# Patient Record
Sex: Female | Born: 1940 | Race: White | Hispanic: No | Smoking: Former smoker
Health system: Southern US, Community
[De-identification: ages and names within clinical notes are randomized; demographics above are authoritative.]

## PROBLEM LIST (undated history)

## (undated) DIAGNOSIS — I2510 Atherosclerotic heart disease of native coronary artery without angina pectoris: Secondary | ICD-10-CM

## (undated) DIAGNOSIS — J45909 Unspecified asthma, uncomplicated: Secondary | ICD-10-CM

## (undated) DIAGNOSIS — J189 Pneumonia, unspecified organism: Secondary | ICD-10-CM

## (undated) DIAGNOSIS — E7800 Pure hypercholesterolemia, unspecified: Secondary | ICD-10-CM

## (undated) DIAGNOSIS — I219 Acute myocardial infarction, unspecified: Secondary | ICD-10-CM

## (undated) DIAGNOSIS — J449 Chronic obstructive pulmonary disease, unspecified: Secondary | ICD-10-CM

## (undated) DIAGNOSIS — N183 Chronic kidney disease, stage 3 unspecified: Secondary | ICD-10-CM

## (undated) DIAGNOSIS — I509 Heart failure, unspecified: Secondary | ICD-10-CM

## (undated) DIAGNOSIS — I10 Essential (primary) hypertension: Secondary | ICD-10-CM

## (undated) DIAGNOSIS — M1990 Unspecified osteoarthritis, unspecified site: Secondary | ICD-10-CM

## (undated) HISTORY — PX: JOINT REPLACEMENT: SHX530

## (undated) HISTORY — PX: CORONARY ANGIOPLASTY: SHX604

---

## 1994-08-06 DIAGNOSIS — J189 Pneumonia, unspecified organism: Secondary | ICD-10-CM

## 1994-08-06 HISTORY — DX: Pneumonia, unspecified organism: J18.9

## 1999-01-19 ENCOUNTER — Emergency Department (HOSPITAL_COMMUNITY): Admission: EM | Admit: 1999-01-19 | Discharge: 1999-01-19 | Payer: Self-pay | Admitting: Emergency Medicine

## 2001-07-08 ENCOUNTER — Encounter: Payer: Self-pay | Admitting: Orthopedic Surgery

## 2001-07-08 ENCOUNTER — Ambulatory Visit (HOSPITAL_COMMUNITY): Admission: RE | Admit: 2001-07-08 | Discharge: 2001-07-08 | Payer: Self-pay | Admitting: Orthopedic Surgery

## 2002-08-06 HISTORY — PX: KNEE ARTHROSCOPY: SHX127

## 2003-06-08 ENCOUNTER — Encounter: Admission: RE | Admit: 2003-06-08 | Discharge: 2003-06-08 | Payer: Self-pay | Admitting: Family Medicine

## 2004-10-18 ENCOUNTER — Encounter: Admission: RE | Admit: 2004-10-18 | Discharge: 2004-10-18 | Payer: Self-pay | Admitting: Family Medicine

## 2008-08-24 ENCOUNTER — Other Ambulatory Visit: Admission: RE | Admit: 2008-08-24 | Discharge: 2008-08-24 | Payer: Self-pay | Admitting: Family Medicine

## 2008-08-31 ENCOUNTER — Encounter: Admission: RE | Admit: 2008-08-31 | Discharge: 2008-08-31 | Payer: Self-pay | Admitting: Family Medicine

## 2009-10-17 ENCOUNTER — Encounter: Admission: RE | Admit: 2009-10-17 | Discharge: 2009-10-17 | Payer: Self-pay | Admitting: Family Medicine

## 2010-12-22 NOTE — Op Note (Signed)
[facility]  Patient:    Amber Rogers, Amber Rogers Visit Number: [id number] MRN: [id number]          Service Type: DSU Location: DAY Attending Physician:  Marlowe Kays Page Dictated by:   Illene Labrador. Aplington, M.D. Proc. Date: 07/08/01 Admit Date:  07/08/2001                             Operative Report  PREOPERATIVE DIAGNOSIS:  Osteoarthritis and torn medial meniscus, right knee.  POSTOPERATIVE DIAGNOSES: 1. Osteoarthritis. 2. Torn medial and lateral menisci, right knee.  OPERATION:  Right knee arthroscopy with: 1. Partial medial and lateral meniscectomy. 2. Debridement of patella, lateral femoral condyle, medial femoral condyle,    and medial tibial plateau, right knee.  SURGEON:  Illene Labrador. Aplington, M.D.  ASSISTANT:  Nurse.  ANESTHESIA:  General.  PATHOLOGY AND JUSTIFICATION FOR PROCEDURE:  Because of progressive right knee pain, I obtained an MRI of her right knee which demonstrated moderate osteoarthritis, particularly in the medial compartment of the knee joint with what was felt to be a complex tear of the medial meniscus.  Because of this, she is here today for the above mentioned surgery.  Additional arthroscopic findings are noted as discussed below.  DESCRIPTION OF PROCEDURE:  Satisfactory general anesthesia, pneumatic tourniquet, thigh stabilizer.  The right knee was prepped with DuraPrep, and draped in the sterile field.  Superomedial saline inflow.  First through an anteromedial portal, the lateral compartment of the knee joint was evaluated. There was a moderate synovitis present which I resected.  She had one area of grade 2 out of 4 chondromalacia of the mid to lateral portion of the medial femoral condyle which I pictured and shaved down with the 3.5 shaver.  She had significant tearing, particularly at the inner border of the lateral meniscus, particularly at the curve posteriorly and after picturing this, I debrided back with  baskets, and shaved the meniscus down with a 3.5 shaver.  Final pictures were taken.  Her ACL was intact.  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  Most of this I could shave through this portal, but I had to do a little additional shaving on the reverse portal technique.  After shaving as much of the patella as I could through the anterolateral portal, I reversed portals, and looked at the medial compartment of the knee joint through the anterolateral portal.  She had some synovitis which I resected.  She had a good bit of wear of the medial tibial plateau which I pictured as well as a full thickness defect along the junction of the mid anterior third of the medial meniscus, and this was associated with an area of fairly significant tearing just posterior to it and to the posterior curve. This was also pictured.  I then debrided back the meniscus to a stable rim, and then smoothed it down with a 3.5 shaver as well as the medial femoral condyle.  On probing the residual medial meniscus, it was nice and stable with no other defects noted.  A final picture was taken.  I then went up in the medial gutter and the suprapatellar area and completed the shaving of the patella with the 3.5 shaver.  When all of this had been completed, I irrigated the wound well with sterile saline  until clear.  The two anterior portals were then closed with 4-0 nylon and I injected through the inflow apparatus 20 cc of 0.5% Marcaine with adrenalin and 4 mg of morphine.  I then removed the inflow cannula and closed this portal with 4-0 nylon as well.  Betadine Adaptic dry sterile dressings were applied.  The tourniquet was released.  She tolerated the procedure well and was taken to the recovery room in satisfactory condition with no known complication. Dictated by:   Illene Labrador.  Aplington, M.D. Attending Physician:  Joaquin Courts DD:  07/08/01 TD:  07/08/01 Job: [id number]

## 2011-08-16 ENCOUNTER — Other Ambulatory Visit: Payer: Self-pay | Admitting: Family Medicine

## 2011-08-16 DIAGNOSIS — Z1231 Encounter for screening mammogram for malignant neoplasm of breast: Secondary | ICD-10-CM

## 2011-08-20 DIAGNOSIS — Z1211 Encounter for screening for malignant neoplasm of colon: Secondary | ICD-10-CM | POA: Diagnosis not present

## 2011-08-20 DIAGNOSIS — Z79899 Other long term (current) drug therapy: Secondary | ICD-10-CM | POA: Diagnosis not present

## 2011-08-20 DIAGNOSIS — M1990 Unspecified osteoarthritis, unspecified site: Secondary | ICD-10-CM | POA: Diagnosis not present

## 2011-08-20 DIAGNOSIS — E7800 Pure hypercholesterolemia, unspecified: Secondary | ICD-10-CM | POA: Diagnosis not present

## 2011-08-20 DIAGNOSIS — I10 Essential (primary) hypertension: Secondary | ICD-10-CM | POA: Diagnosis not present

## 2011-08-20 DIAGNOSIS — J449 Chronic obstructive pulmonary disease, unspecified: Secondary | ICD-10-CM | POA: Diagnosis not present

## 2011-08-21 ENCOUNTER — Ambulatory Visit: Payer: Self-pay

## 2011-08-22 ENCOUNTER — Ambulatory Visit
Admission: RE | Admit: 2011-08-22 | Discharge: 2011-08-22 | Disposition: A | Discharge status: Home / Self Care | Payer: BC Managed Care – PPO | Source: Ambulatory Visit | Attending: Family Medicine | Admitting: Family Medicine

## 2011-08-22 DIAGNOSIS — Z1231 Encounter for screening mammogram for malignant neoplasm of breast: Secondary | ICD-10-CM

## 2012-02-20 DIAGNOSIS — M1990 Unspecified osteoarthritis, unspecified site: Secondary | ICD-10-CM | POA: Diagnosis not present

## 2012-02-20 DIAGNOSIS — I10 Essential (primary) hypertension: Secondary | ICD-10-CM | POA: Diagnosis not present

## 2012-02-20 DIAGNOSIS — J449 Chronic obstructive pulmonary disease, unspecified: Secondary | ICD-10-CM | POA: Diagnosis not present

## 2012-02-20 DIAGNOSIS — F17200 Nicotine dependence, unspecified, uncomplicated: Secondary | ICD-10-CM | POA: Diagnosis not present

## 2012-02-20 DIAGNOSIS — M79609 Pain in unspecified limb: Secondary | ICD-10-CM | POA: Diagnosis not present

## 2012-10-30 DIAGNOSIS — M25559 Pain in unspecified hip: Secondary | ICD-10-CM | POA: Diagnosis not present

## 2012-11-04 DIAGNOSIS — M169 Osteoarthritis of hip, unspecified: Secondary | ICD-10-CM | POA: Diagnosis not present

## 2012-11-28 DIAGNOSIS — M169 Osteoarthritis of hip, unspecified: Secondary | ICD-10-CM | POA: Diagnosis not present

## 2013-01-05 DIAGNOSIS — M169 Osteoarthritis of hip, unspecified: Secondary | ICD-10-CM | POA: Diagnosis not present

## 2013-01-05 DIAGNOSIS — Z87891 Personal history of nicotine dependence: Secondary | ICD-10-CM | POA: Diagnosis not present

## 2013-01-16 DIAGNOSIS — M169 Osteoarthritis of hip, unspecified: Secondary | ICD-10-CM | POA: Diagnosis not present

## 2013-01-28 ENCOUNTER — Encounter (HOSPITAL_COMMUNITY): Payer: Self-pay | Admitting: Pharmacy Technician

## 2013-01-28 DIAGNOSIS — M169 Osteoarthritis of hip, unspecified: Secondary | ICD-10-CM | POA: Diagnosis not present

## 2013-01-29 ENCOUNTER — Other Ambulatory Visit: Payer: Self-pay | Admitting: Family Medicine

## 2013-01-29 DIAGNOSIS — M1990 Unspecified osteoarthritis, unspecified site: Secondary | ICD-10-CM | POA: Diagnosis not present

## 2013-01-29 DIAGNOSIS — R9431 Abnormal electrocardiogram [ECG] [EKG]: Secondary | ICD-10-CM | POA: Diagnosis not present

## 2013-01-29 DIAGNOSIS — Z1239 Encounter for other screening for malignant neoplasm of breast: Secondary | ICD-10-CM | POA: Diagnosis not present

## 2013-01-29 DIAGNOSIS — I10 Essential (primary) hypertension: Secondary | ICD-10-CM | POA: Diagnosis not present

## 2013-01-29 DIAGNOSIS — Z01810 Encounter for preprocedural cardiovascular examination: Secondary | ICD-10-CM | POA: Diagnosis not present

## 2013-01-29 DIAGNOSIS — Z1231 Encounter for screening mammogram for malignant neoplasm of breast: Secondary | ICD-10-CM

## 2013-01-29 DIAGNOSIS — F17200 Nicotine dependence, unspecified, uncomplicated: Secondary | ICD-10-CM | POA: Diagnosis not present

## 2013-01-29 DIAGNOSIS — J449 Chronic obstructive pulmonary disease, unspecified: Secondary | ICD-10-CM | POA: Diagnosis not present

## 2013-01-29 DIAGNOSIS — Z1211 Encounter for screening for malignant neoplasm of colon: Secondary | ICD-10-CM | POA: Diagnosis not present

## 2013-01-29 DIAGNOSIS — E7800 Pure hypercholesterolemia, unspecified: Secondary | ICD-10-CM | POA: Diagnosis not present

## 2013-02-02 ENCOUNTER — Encounter (HOSPITAL_COMMUNITY)
Admission: RE | Admit: 2013-02-02 | Discharge: 2013-02-02 | Disposition: A | Discharge status: Home / Self Care | Payer: BC Managed Care – PPO | Source: Ambulatory Visit | Attending: Orthopedic Surgery | Admitting: Orthopedic Surgery

## 2013-02-02 ENCOUNTER — Encounter (HOSPITAL_COMMUNITY): Payer: Self-pay

## 2013-02-02 DIAGNOSIS — M169 Osteoarthritis of hip, unspecified: Secondary | ICD-10-CM | POA: Insufficient documentation

## 2013-02-02 DIAGNOSIS — M1610 Unilateral primary osteoarthritis, unspecified hip: Secondary | ICD-10-CM | POA: Insufficient documentation

## 2013-02-02 DIAGNOSIS — Z01812 Encounter for preprocedural laboratory examination: Secondary | ICD-10-CM | POA: Insufficient documentation

## 2013-02-02 HISTORY — DX: Essential (primary) hypertension: I10

## 2013-02-02 HISTORY — DX: Pneumonia, unspecified organism: J18.9

## 2013-02-02 HISTORY — DX: Unspecified asthma, uncomplicated: J45.909

## 2013-02-02 LAB — CBC
MCH: 29.5 pg (ref 26.0–34.0)
Platelets: 407 10*3/uL — ABNORMAL HIGH (ref 150–400)
RBC: 4 MIL/uL (ref 3.87–5.11)
RDW: 13.4 % (ref 11.5–15.5)
WBC: 11 10*3/uL — ABNORMAL HIGH (ref 4.0–10.5)

## 2013-02-02 LAB — BASIC METABOLIC PANEL
Calcium: 9.7 mg/dL (ref 8.4–10.5)
Chloride: 104 mEq/L (ref 96–112)
GFR calc non Af Amer: 57 mL/min — ABNORMAL LOW (ref >90)
Potassium: 4.1 mEq/L (ref 3.5–5.1)

## 2013-02-02 LAB — URINALYSIS, ROUTINE W REFLEX MICROSCOPIC
Hgb urine dipstick: NEGATIVE
Ketones, ur: NEGATIVE mg/dL
Protein, ur: NEGATIVE mg/dL
Urobilinogen, UA: 0.2 mg/dL (ref 0.0–1.0)

## 2013-02-02 LAB — PROTIME-INR: Prothrombin Time: 12.3 seconds (ref 11.6–15.2)

## 2013-02-02 LAB — SURGICAL PCR SCREEN: Staphylococcus aureus: NEGATIVE

## 2013-02-02 NOTE — Progress Notes (Signed)
02/02/13 1054  OBSTRUCTIVE SLEEP APNEA  Have you ever been diagnosed with sleep apnea through a sleep study? No  Do you snore loudly (loud enough to be heard through closed doors)?  1  Do you often feel tired, fatigued, or sleepy during the daytime? 1  Has anyone observed you stop breathing during your sleep? 0  Do you have, or are you being treated for high blood pressure? 1  BMI more than 35 kg/m2? 0  Age over 71 years old? 1  Neck circumference greater than 40 cm/18 inches? 0  Gender: 0  Obstructive Sleep Apnea Score 4  Score 4 or greater  Results sent to PCP

## 2013-02-02 NOTE — Patient Instructions (Addendum)
20      Your procedure is scheduled on:  Tuesday 02/10/2013  Report to [facility] at  1035  AM.  Call this number if you have problems the morning of surgery: [phone]   Remember:             IF YOU USE CPAP,BRING MASK AND TUBING AM OF SURGERY!   Do not eat food AFTER MIDNIGHT! MAY HAVE CLEAR LIQUIDS FROM MIDNIGHT UP UNTIL 0735 AM THEN NOTHING UNTIL AFTER SURGERY!  Take these medicines the morning of surgery with A SIP OF WATER:  Use Advair Diskus inhaler if needed   Do not bring valuables to the hospital. [facility] IS NOT RESPONSIBLE  FOR ANY BELONGINGS OR VALUABLES.  Wynelle Fanny suitcase in the car. After surgery it may be brought to your room.  For patients admitted to the hospital, checkout time is 11:00 AM the day of              Discharge.    DO NOT WEAR JEWELRY , MAKE-UP, LOTIONS,POWDERS,PERFUMES!             WOMEN -DO NOT SHAVE LEGS OR UNDERARMS 12 HRS. BEFORE  SURGERY!               MEN MAY SHAVE AS USUAL!             CONTACTS,DENTURES OR BRIDGEWORK, FALSE EYELASHES MAY NOT BE WORN INTO SURGERY!                                           Patients discharged the day of surgery will not be allowed to drive home.If going home the same day of surgery, must have someone stay with you first 24 hrs.at home and arrange for someone to drive you home from the Hospital.                         YOUR DRIVER IS: Randa Evens   Special Instructions:             Please read over the following fact sheets that you were given:             1.  PREPARING FOR SURGERY SHEET              2.MRSA INFORMATION              3.INCENTIVE SPIROMETRY                                        Telford Nab.Jacquelynn Friend,RN,BSN     [phone]                FAILURE TO FOLLOW THESE INSTRUCTIONS MAY RESULT IN  CANCELLATION OF YOUR SURGERY!               Patient Signature:___________________________

## 2013-02-02 NOTE — Progress Notes (Signed)
Office note from Dr. Manus Gunning from 01/29/2013 and EKG from [facility] from 01/29/2013 and Hgb,Lipid pannel, and BMET from 01/27/2013 by Dr. Manus Gunning all on chart.

## 2013-02-03 NOTE — Progress Notes (Signed)
Pre-operative clearance note from Dr. Manus Gunning 01/23/2013 on chart.

## 2013-02-08 NOTE — H&P (Signed)
TOTAL HIP ADMISSION H&P  Patient is admitted for right total hip arthroplasty, anterior aproach.  Subjective:  Chief Complaint: Right hip OA / pain  HPI: Amber Rogers, 72 y.o. female, has a history of pain and functional disability in the right hip(s) due to arthritis and patient has failed non-surgical conservative treatments for greater than 12 weeks to include NSAID's and/or analgesics, use of assistive devices and activity modification.  Onset of symptoms was gradual starting 1 years ago with rapidlly worsening course since that time.The patient noted no past surgery on the right hip(s).  Patient currently rates pain in the right hip at 10 out of 10 with activity. Patient has worsening of pain with activity and weight bearing, trendelenberg gait, pain that interfers with activities of daily living, pain with passive range of motion and worst pain when trying to get up from sitting position. Patient has evidence of periarticular osteophytes and joint space narrowing by imaging studies. This condition presents safety issues increasing the risk of falls.   There is no current active signs of infection.  Risks, benefits and expectations were discussed with the patient. Patient understand the risks, benefits and expectations and wishes to proceed with surgery.   D/C Plans:   Home with HHPT - Daughter's house  Post-op Meds:    Rx given for ASA, Zanaflex, Iron, Colace and MiraLax  Tranexamic Acid:   To be given  Decadron:    To be given  FYI:    ASA post-op   Past Medical History  Diagnosis Date  . Hypertension   . Arthritis   . Asthma   . Pneumonia 1996    Past Surgical History  Procedure Laterality Date  . Right knee arthroscopy  2004    No prescriptions prior to admission   Not on File  History  Substance Use Topics  . Smoking status: Current Every Day Smoker -- 25 years    Types: Cigarettes  . Smokeless tobacco: Not on file     Comment: smokes occassionally-1-2 when nervous   . Alcohol Use: No    No family history on file.   Review of Systems  Constitutional: Negative.   HENT: Negative.   Eyes: Negative.   Respiratory: Negative.   Cardiovascular: Negative.   Gastrointestinal: Negative.   Genitourinary: Negative.   Musculoskeletal: Positive for myalgias, back pain and joint pain.  Skin: Negative.   Neurological: Positive for dizziness.  Endo/Heme/Allergies: Negative.   Psychiatric/Behavioral: Negative.     Objective:  Physical Exam  Constitutional: She is oriented to person, place, and time. She appears well-developed and well-nourished.  HENT:  Head: Normocephalic and atraumatic.  Mouth/Throat: Oropharynx is clear and moist.  Eyes: Pupils are equal, round, and reactive to light.  Neck: Neck supple. No JVD present. No tracheal deviation present. No thyromegaly present.  Cardiovascular: Normal rate, regular rhythm, normal heart sounds and intact distal pulses.   Respiratory: Effort normal and breath sounds normal. No stridor. No respiratory distress. She has no wheezes.  GI: Soft. There is no tenderness. There is no guarding.  Musculoskeletal:       Right hip: She exhibits decreased range of motion, decreased strength, tenderness and bony tenderness. She exhibits no swelling, no deformity and no laceration.  Lymphadenopathy:    She has no cervical adenopathy.  Neurological: She is alert and oriented to person, place, and time.  Skin: Skin is warm and dry.  Psychiatric: She has a normal mood and affect.    Imaging Review Plain  radiographs demonstrate severe degenerative joint disease of the right hip(s). The bone quality appears to be good for age and reported activity level.  Assessment/Plan:  End stage arthritis, right hip(s)  The patient history, physical examination, clinical judgement of the provider and imaging studies are consistent with end stage degenerative joint disease of the right hip(s) and total hip arthroplasty is deemed  medically necessary. The treatment options including medical management, injection therapy, arthroscopy and arthroplasty were discussed at length. The risks and benefits of total hip arthroplasty were presented and reviewed. The risks due to aseptic loosening, infection, stiffness, dislocation/subluxation,  thromboembolic complications and other imponderables were discussed.  The patient acknowledged the explanation, agreed to proceed with the plan and consent was signed. Patient is being admitted for inpatient treatment for surgery, pain control, PT, OT, prophylactic antibiotics, VTE prophylaxis, progressive ambulation and ADL's and discharge planning.The patient is planning to be discharged home with home health services.    Amber Rogers Amber Rogers   PAC  02/08/2013, 10:53 AM

## 2013-02-09 NOTE — Progress Notes (Signed)
Pt notified of time change to 12:45 pm - instructed to arrive at 9:45 am - clear liq until 6:45 am

## 2013-02-10 ENCOUNTER — Inpatient Hospital Stay (HOSPITAL_COMMUNITY): Payer: BC Managed Care – PPO

## 2013-02-10 ENCOUNTER — Encounter (HOSPITAL_COMMUNITY): Payer: Self-pay | Admitting: Anesthesiology

## 2013-02-10 ENCOUNTER — Inpatient Hospital Stay (HOSPITAL_COMMUNITY)
Admission: RE | Admit: 2013-02-10 | Discharge: 2013-02-12 | DRG: 818 | Disposition: A | Discharge status: Home Health | Payer: BC Managed Care – PPO | Source: Ambulatory Visit | Attending: Orthopedic Surgery | Admitting: Orthopedic Surgery

## 2013-02-10 ENCOUNTER — Inpatient Hospital Stay (HOSPITAL_COMMUNITY): Payer: BC Managed Care – PPO | Admitting: Anesthesiology

## 2013-02-10 ENCOUNTER — Encounter (HOSPITAL_COMMUNITY): Payer: Self-pay | Admitting: *Deleted

## 2013-02-10 ENCOUNTER — Encounter (HOSPITAL_COMMUNITY)
Admission: RE | Disposition: A | Discharge status: Home Health | Payer: Self-pay | Source: Ambulatory Visit | Attending: Orthopedic Surgery

## 2013-02-10 DIAGNOSIS — D500 Iron deficiency anemia secondary to blood loss (chronic): Secondary | ICD-10-CM

## 2013-02-10 DIAGNOSIS — M1610 Unilateral primary osteoarthritis, unspecified hip: Principal | ICD-10-CM | POA: Diagnosis present

## 2013-02-10 DIAGNOSIS — F17200 Nicotine dependence, unspecified, uncomplicated: Secondary | ICD-10-CM | POA: Diagnosis not present

## 2013-02-10 DIAGNOSIS — M25559 Pain in unspecified hip: Secondary | ICD-10-CM | POA: Diagnosis not present

## 2013-02-10 DIAGNOSIS — I10 Essential (primary) hypertension: Secondary | ICD-10-CM | POA: Diagnosis not present

## 2013-02-10 DIAGNOSIS — Z01812 Encounter for preprocedural laboratory examination: Secondary | ICD-10-CM

## 2013-02-10 DIAGNOSIS — Z96649 Presence of unspecified artificial hip joint: Secondary | ICD-10-CM

## 2013-02-10 DIAGNOSIS — D62 Acute posthemorrhagic anemia: Secondary | ICD-10-CM | POA: Diagnosis not present

## 2013-02-10 DIAGNOSIS — M169 Osteoarthritis of hip, unspecified: Secondary | ICD-10-CM | POA: Diagnosis not present

## 2013-02-10 HISTORY — PX: TOTAL HIP ARTHROPLASTY: SHX124

## 2013-02-10 LAB — TYPE AND SCREEN: Antibody Screen: NEGATIVE

## 2013-02-10 SURGERY — ARTHROPLASTY, HIP, TOTAL, ANTERIOR APPROACH
Anesthesia: Spinal | Site: Hip | Laterality: Right | Wound class: Clean

## 2013-02-10 MED ORDER — 0.9 % SODIUM CHLORIDE (POUR BTL) OPTIME
TOPICAL | Status: DC | PRN
  Administered 2013-02-10: 1000 mL

## 2013-02-10 MED ORDER — PREDNISONE 5 MG PO TABS
5.0000 mg | ORAL_TABLET | Freq: Every day | ORAL | Status: DC
  Administered 2013-02-10 – 2013-02-11 (×2): 5 mg via ORAL
  Filled 2013-02-10 (×3): qty 1

## 2013-02-10 MED ORDER — HYDROCORTISONE NA SUCCINATE PF 1000 MG IJ SOLR
INTRAMUSCULAR | Status: DC | PRN
  Administered 2013-02-10: 100 mg via INTRAVENOUS

## 2013-02-10 MED ORDER — PROMETHAZINE HCL 25 MG/ML IJ SOLN: 6.2500 mg | INTRAMUSCULAR | Status: DC | PRN

## 2013-02-10 MED ORDER — SODIUM CHLORIDE 0.9 % IV SOLN
1000.0000 mg | Freq: Once | INTRAVENOUS | Status: AC
  Administered 2013-02-10: 1000 mg via INTRAVENOUS
  Filled 2013-02-10: qty 10

## 2013-02-10 MED ORDER — MOMETASONE FURO-FORMOTEROL FUM 100-5 MCG/ACT IN AERO
2.0000 | INHALATION_SPRAY | Freq: Two times a day (BID) | RESPIRATORY_TRACT | Status: DC
  Administered 2013-02-10 – 2013-02-12 (×4): 2 via RESPIRATORY_TRACT
  Filled 2013-02-10: qty 8.8

## 2013-02-10 MED ORDER — DEXAMETHASONE SODIUM PHOSPHATE 10 MG/ML IJ SOLN: 10.0000 mg | Freq: Once | INTRAMUSCULAR | Status: DC

## 2013-02-10 MED ORDER — ONDANSETRON HCL 4 MG/2ML IJ SOLN
4.0000 mg | Freq: Four times a day (QID) | INTRAMUSCULAR | Status: DC | PRN
  Administered 2013-02-11: 4 mg via INTRAVENOUS
  Filled 2013-02-10: qty 2

## 2013-02-10 MED ORDER — DIPHENHYDRAMINE HCL 25 MG PO CAPS: 25.0000 mg | ORAL_CAPSULE | Freq: Four times a day (QID) | ORAL | Status: DC | PRN

## 2013-02-10 MED ORDER — DOCUSATE SODIUM 100 MG PO CAPS
100.0000 mg | ORAL_CAPSULE | Freq: Two times a day (BID) | ORAL | Status: DC
  Administered 2013-02-10 – 2013-02-12 (×4): 100 mg via ORAL

## 2013-02-10 MED ORDER — CEFAZOLIN SODIUM-DEXTROSE 2-3 GM-% IV SOLR
2.0000 g | INTRAVENOUS | Status: AC
  Administered 2013-02-10: 2 g via INTRAVENOUS

## 2013-02-10 MED ORDER — AMLODIPINE BESYLATE 10 MG PO TABS
10.0000 mg | ORAL_TABLET | Freq: Every day | ORAL | Status: DC
  Administered 2013-02-10 – 2013-02-11 (×2): 10 mg via ORAL
  Filled 2013-02-10 (×3): qty 1

## 2013-02-10 MED ORDER — FENTANYL CITRATE 0.05 MG/ML IJ SOLN
INTRAMUSCULAR | Status: DC | PRN
  Administered 2013-02-10 (×2): 50 ug via INTRAVENOUS

## 2013-02-10 MED ORDER — ONDANSETRON HCL 4 MG/2ML IJ SOLN
INTRAMUSCULAR | Status: DC | PRN
  Administered 2013-02-10: 4 mg via INTRAVENOUS

## 2013-02-10 MED ORDER — PHENOL 1.4 % MT LIQD
1.0000 | OROMUCOSAL | Status: DC | PRN
  Filled 2013-02-10: qty 177

## 2013-02-10 MED ORDER — CEFAZOLIN SODIUM-DEXTROSE 2-3 GM-% IV SOLR: 2.0000 g | Freq: Four times a day (QID) | INTRAVENOUS | Status: DC

## 2013-02-10 MED ORDER — METHOCARBAMOL 100 MG/ML IJ SOLN
500.0000 mg | Freq: Four times a day (QID) | INTRAVENOUS | Status: DC | PRN
  Administered 2013-02-10: 500 mg via INTRAVENOUS
  Filled 2013-02-10: qty 5

## 2013-02-10 MED ORDER — FLEET ENEMA 7-19 GM/118ML RE ENEM: 1.0000 | ENEMA | Freq: Once | RECTAL | Status: AC | PRN

## 2013-02-10 MED ORDER — ASPIRIN EC 325 MG PO TBEC
325.0000 mg | DELAYED_RELEASE_TABLET | Freq: Two times a day (BID) | ORAL | Status: DC
  Administered 2013-02-11 – 2013-02-12 (×3): 325 mg via ORAL
  Filled 2013-02-10 (×5): qty 1

## 2013-02-10 MED ORDER — METOCLOPRAMIDE HCL 5 MG/ML IJ SOLN
5.0000 mg | Freq: Three times a day (TID) | INTRAMUSCULAR | Status: DC | PRN
  Administered 2013-02-11: 10 mg via INTRAVENOUS
  Filled 2013-02-10: qty 2

## 2013-02-10 MED ORDER — ALUM & MAG HYDROXIDE-SIMETH 200-200-20 MG/5ML PO SUSP: 30.0000 mL | ORAL | Status: DC | PRN

## 2013-02-10 MED ORDER — HYDROMORPHONE HCL PF 1 MG/ML IJ SOLN: 0.5000 mg | INTRAMUSCULAR | Status: DC | PRN

## 2013-02-10 MED ORDER — HYDROCODONE-ACETAMINOPHEN 7.5-325 MG PO TABS
1.0000 | ORAL_TABLET | ORAL | Status: DC
  Administered 2013-02-10 – 2013-02-11 (×4): 2 via ORAL
  Administered 2013-02-11: 1 via ORAL
  Administered 2013-02-11 – 2013-02-12 (×6): 2 via ORAL
  Filled 2013-02-10 (×11): qty 2

## 2013-02-10 MED ORDER — LACTATED RINGERS IV SOLN
INTRAVENOUS | Status: DC
  Administered 2013-02-10: 15:00:00 via INTRAVENOUS
  Administered 2013-02-10: 1000 mL via INTRAVENOUS

## 2013-02-10 MED ORDER — METHOCARBAMOL 500 MG PO TABS
500.0000 mg | ORAL_TABLET | Freq: Four times a day (QID) | ORAL | Status: DC | PRN
  Administered 2013-02-12: 500 mg via ORAL
  Filled 2013-02-10: qty 1

## 2013-02-10 MED ORDER — METOCLOPRAMIDE HCL 10 MG PO TABS: 5.0000 mg | ORAL_TABLET | Freq: Three times a day (TID) | ORAL | Status: DC | PRN

## 2013-02-10 MED ORDER — GLYCOPYRROLATE 0.2 MG/ML IJ SOLN
INTRAMUSCULAR | Status: DC | PRN
  Administered 2013-02-10: 0.2 mg via INTRAVENOUS

## 2013-02-10 MED ORDER — BISACODYL 10 MG RE SUPP: 10.0000 mg | Freq: Every day | RECTAL | Status: DC | PRN

## 2013-02-10 MED ORDER — PROPOFOL INFUSION 10 MG/ML OPTIME
INTRAVENOUS | Status: DC | PRN
  Administered 2013-02-10: 75 ug/kg/min via INTRAVENOUS

## 2013-02-10 MED ORDER — FENTANYL CITRATE 0.05 MG/ML IJ SOLN
25.0000 ug | INTRAMUSCULAR | Status: DC | PRN
  Administered 2013-02-10 (×2): 50 ug via INTRAVENOUS

## 2013-02-10 MED ORDER — SODIUM CHLORIDE 0.9 % IV SOLN
100.0000 mL/h | INTRAVENOUS | Status: DC
  Administered 2013-02-10 – 2013-02-11 (×2): 100 mL/h via INTRAVENOUS
  Filled 2013-02-10 (×10): qty 1000

## 2013-02-10 MED ORDER — CHLORHEXIDINE GLUCONATE 4 % EX LIQD
60.0000 mL | Freq: Once | CUTANEOUS | Status: DC
  Filled 2013-02-10: qty 60

## 2013-02-10 MED ORDER — POLYETHYLENE GLYCOL 3350 17 G PO PACK
17.0000 g | PACK | Freq: Two times a day (BID) | ORAL | Status: DC
  Administered 2013-02-10 – 2013-02-11 (×2): 17 g via ORAL

## 2013-02-10 MED ORDER — MIDAZOLAM HCL 5 MG/5ML IJ SOLN
INTRAMUSCULAR | Status: DC | PRN
  Administered 2013-02-10 (×2): 1 mg via INTRAVENOUS

## 2013-02-10 MED ORDER — ONDANSETRON HCL 4 MG PO TABS: 4.0000 mg | ORAL_TABLET | Freq: Four times a day (QID) | ORAL | Status: DC | PRN

## 2013-02-10 MED ORDER — LIDOCAINE HCL (CARDIAC) 20 MG/ML IV SOLN
INTRAVENOUS | Status: DC | PRN
  Administered 2013-02-10: 50 mg via INTRAVENOUS

## 2013-02-10 MED ORDER — ZOLPIDEM TARTRATE 5 MG PO TABS: 5.0000 mg | ORAL_TABLET | Freq: Every evening | ORAL | Status: DC | PRN

## 2013-02-10 MED ORDER — CELECOXIB 200 MG PO CAPS
200.0000 mg | ORAL_CAPSULE | Freq: Two times a day (BID) | ORAL | Status: DC
  Administered 2013-02-10 – 2013-02-12 (×4): 200 mg via ORAL
  Filled 2013-02-10 (×5): qty 1

## 2013-02-10 MED ORDER — FERROUS SULFATE 325 (65 FE) MG PO TABS
325.0000 mg | ORAL_TABLET | Freq: Three times a day (TID) | ORAL | Status: DC
  Administered 2013-02-11 (×4): 325 mg via ORAL
  Filled 2013-02-10 (×8): qty 1

## 2013-02-10 MED ORDER — MENTHOL 3 MG MT LOZG
1.0000 | LOZENGE | OROMUCOSAL | Status: DC | PRN
  Filled 2013-02-10: qty 9

## 2013-02-10 SURGICAL SUPPLY — 40 items
BAG ZIPLOCK 12X15 (MISCELLANEOUS) ×4 IMPLANT
BLADE SAW SGTL 18X1.27X75 (BLADE) ×2 IMPLANT
CAPT HIP PF MOP ×2 IMPLANT
CLOTH BEACON ORANGE TIMEOUT ST (SAFETY) ×2 IMPLANT
DERMABOND ADVANCED (GAUZE/BANDAGES/DRESSINGS) ×1
DERMABOND ADVANCED .7 DNX12 (GAUZE/BANDAGES/DRESSINGS) ×1 IMPLANT
DRAPE C-ARM 42X120 X-RAY (DRAPES) ×2 IMPLANT
DRAPE STERI IOBAN 125X83 (DRAPES) ×2 IMPLANT
DRAPE U-SHAPE 47X51 STRL (DRAPES) ×6 IMPLANT
DRSG AQUACEL AG ADV 3.5X10 (GAUZE/BANDAGES/DRESSINGS) ×2 IMPLANT
DRSG TEGADERM 4X4.75 (GAUZE/BANDAGES/DRESSINGS) IMPLANT
DURAPREP 26ML APPLICATOR (WOUND CARE) ×2 IMPLANT
ELECT BLADE TIP CTD 4 INCH (ELECTRODE) ×2 IMPLANT
ELECT REM PT RETURN 9FT ADLT (ELECTROSURGICAL) ×2
ELECTRODE REM PT RTRN 9FT ADLT (ELECTROSURGICAL) ×1 IMPLANT
EVACUATOR 1/8 PVC DRAIN (DRAIN) IMPLANT
FACESHIELD LNG OPTICON STERILE (SAFETY) ×8 IMPLANT
GAUZE SPONGE 2X2 8PLY STRL LF (GAUZE/BANDAGES/DRESSINGS) ×1 IMPLANT
GLOVE BIOGEL PI IND STRL 7.5 (GLOVE) ×1 IMPLANT
GLOVE BIOGEL PI IND STRL 8 (GLOVE) ×1 IMPLANT
GLOVE BIOGEL PI INDICATOR 7.5 (GLOVE) ×1
GLOVE BIOGEL PI INDICATOR 8 (GLOVE) ×1
GLOVE ECLIPSE 8.0 STRL XLNG CF (GLOVE) ×2 IMPLANT
GLOVE ORTHO TXT STRL SZ7.5 (GLOVE) ×4 IMPLANT
GOWN BRE IMP PREV XXLGXLNG (GOWN DISPOSABLE) ×2 IMPLANT
GOWN STRL NON-REIN LRG LVL3 (GOWN DISPOSABLE) ×2 IMPLANT
KIT BASIN OR (CUSTOM PROCEDURE TRAY) ×2 IMPLANT
MARKER SKIN DUAL TIP RULER LAB (MISCELLANEOUS) ×2 IMPLANT
PACK TOTAL JOINT (CUSTOM PROCEDURE TRAY) ×2 IMPLANT
PADDING CAST COTTON 6X4 STRL (CAST SUPPLIES) ×2 IMPLANT
SPONGE GAUZE 2X2 STER 10/PKG (GAUZE/BANDAGES/DRESSINGS) ×1
SUCTION FRAZIER 12FR DISP (SUCTIONS) ×2 IMPLANT
SUT MNCRL AB 4-0 PS2 18 (SUTURE) ×2 IMPLANT
SUT VIC AB 1 CT1 36 (SUTURE) ×8 IMPLANT
SUT VIC AB 2-0 CT1 27 (SUTURE) ×2
SUT VIC AB 2-0 CT1 TAPERPNT 27 (SUTURE) ×2 IMPLANT
SUT VLOC 180 0 24IN GS25 (SUTURE) ×2 IMPLANT
TOWEL OR 17X26 10 PK STRL BLUE (TOWEL DISPOSABLE) ×4 IMPLANT
TRAY FOLEY CATH 14FRSI W/METER (CATHETERS) ×2 IMPLANT
WATER STERILE IRR 1500ML POUR (IV SOLUTION) ×4 IMPLANT

## 2013-02-10 NOTE — Plan of Care (Signed)
Problem: Consults Goal: Diagnosis- Total Joint Replacement Outcome: Completed/Met Date Met:  02/10/13 Primary Total Hip RIGHT, ANTERIOR

## 2013-02-10 NOTE — Interval H&P Note (Signed)
History and Physical Interval Note:  02/10/2013 11:50 AM  Amber Rogers  has presented today for surgery, with the diagnosis of RIGHT HIP OA  The various methods of treatment have been discussed with the patient and family. After consideration of risks, benefits and other options for treatment, the patient has consented to  Procedure(s): TOTAL HIP ARTHROPLASTY ANTERIOR APPROACH (Right) as a surgical intervention .  The patient's history has been reviewed, patient examined, no change in status, stable for surgery.  I have reviewed the patient's chart and labs.  Questions were answered to the patient's satisfaction.     [name]

## 2013-02-10 NOTE — Anesthesia Postprocedure Evaluation (Signed)
  Anesthesia Post-op Note  Patient: Amber Rogers  Procedure(s) Performed: Procedure(s) (LRB): TOTAL HIP ARTHROPLASTY ANTERIOR APPROACH (Right)  Patient Location: PACU  Anesthesia Type: Spinal  Level of Consciousness: awake and alert   Airway and Oxygen Therapy: Patient Spontanous Breathing  Post-op Pain: mild  Post-op Assessment: Post-op Vital signs reviewed, Patient's Cardiovascular Status Stable, Respiratory Function Stable, Patent Airway and No signs of Nausea or vomiting  Last Vitals:  Filed Vitals:   02/10/13 1715  BP: 176/56  Pulse: 50  Temp:   Resp: 14    Post-op Vital Signs: stable   Complications: No apparent anesthesia complications

## 2013-02-10 NOTE — Progress Notes (Signed)
X-ray results noted 

## 2013-02-10 NOTE — Transfer of Care (Signed)
Immediate Anesthesia Transfer of Care Note  Patient: Amber Rogers  Procedure(s) Performed: Procedure(s): TOTAL HIP ARTHROPLASTY ANTERIOR APPROACH (Right)  Patient Location: PACU  Anesthesia Type:Spinal  Level of Consciousness: awake, alert , oriented and patient cooperative  Airway & Oxygen Therapy: Patient Spontanous Breathing and Patient connected to face mask oxygen  Post-op Assessment: Report given to PACU RN and Post -op Vital signs reviewed and stable  Post vital signs: stable  Complications: No apparent anesthesia complications    Spinal level     T-12

## 2013-02-10 NOTE — Anesthesia Procedure Notes (Signed)

## 2013-02-10 NOTE — Progress Notes (Signed)
Dr. Okey Dupre made aware of patient's heart rates, blood pressure, and spinal level- O.K. To go to floor

## 2013-02-10 NOTE — Anesthesia Preprocedure Evaluation (Signed)
Anesthesia Evaluation  Patient identified by MRN, date of birth, ID band Patient awake    Reviewed: Allergy & Precautions, H&P , NPO status , Patient's Chart, lab work & pertinent test results  Airway Mallampati: II TM Distance: >3 FB Neck ROM: Full    Dental no notable dental hx.    Pulmonary Current Smoker,  breath sounds clear to auscultation  Pulmonary exam normal       Cardiovascular hypertension, Rhythm:Regular Rate:Normal     Neuro/Psych negative neurological ROS  negative psych ROS   GI/Hepatic negative GI ROS, Neg liver ROS,   Endo/Other  negative endocrine ROS  Renal/GU negative Renal ROS  negative genitourinary   Musculoskeletal  (+) Arthritis -, on steriods ,    Abdominal   Peds negative pediatric ROS (+)  Hematology  (+) Blood dyscrasia, anemia ,   Anesthesia Other Findings   Reproductive/Obstetrics negative OB ROS                           Anesthesia Physical Anesthesia Plan  ASA: III  Anesthesia Plan: Spinal   Post-op Pain Management:    Induction:   Airway Management Planned: Nasal Cannula  Additional Equipment:   Intra-op Plan:   Post-operative Plan:   Informed Consent: I have reviewed the patients History and Physical, chart, labs and discussed the procedure including the risks, benefits and alternatives for the proposed anesthesia with the patient or authorized representative who has indicated his/her understanding and acceptance.     Plan Discussed with: CRNA and Surgeon  Anesthesia Plan Comments:         Anesthesia Quick Evaluation

## 2013-02-10 NOTE — Op Note (Signed)
NAME:  Amber Rogers                ACCOUNT NO.: [id number]      MEDICAL RECORD NO.: [id number]      FACILITY:  [facility]      PHYSICIAN:  Durene Romans D  DATE OF BIRTH:  March 16, 1941     DATE OF PROCEDURE:  02/10/2013                                 OPERATIVE REPORT         PREOPERATIVE DIAGNOSIS: Right  hip osteoarthritis.      POSTOPERATIVE DIAGNOSIS:  Right hip osteoarthritis.      PROCEDURE:  Right total hip replacement through an anterior approach   utilizing DePuy THR system, component size 54mm pinnacle cup, a size 36+4 neutral   Altrex liner, a size 1 Hi Tri Lock stem with a 36+1.5 metal ball.      SURGEON:  Madlyn Frankel. Charlann Boxer, M.D.      ASSISTANT:  Lanney Gins, PA-C     ANESTHESIA:  General.      SPECIMENS:  None.      COMPLICATIONS:  None.      BLOOD LOSS:  200 cc     DRAINS:  One Hemovac.      INDICATION OF THE PROCEDURE:  Amber Rogers is a 72 y.o. female who had   presented to office for evaluation of right hip pain.  Radiographs revealed   progressive degenerative changes with bone-on-bone   articulation to the  hip joint.  The patient had painful limited range of   motion significantly affecting their overall quality of life.  The patient was failing to    respond to conservative measures, and at this point was ready   to proceed with more definitive measures.  The patient has noted progressive   degenerative changes in his hip, progressive problems and dysfunction   with regarding the hip prior to surgery.  Consent was obtained for   benefit of pain relief.  Specific risk of infection, DVT, component   failure, dislocation, need for revision surgery, as well discussion of   the anterior versus posterior approach were reviewed.  Consent was   obtained for benefit of anterior pain relief through an anterior   approach.      PROCEDURE IN DETAIL:  The patient was brought to operative theater.   Once adequate anesthesia, preoperative  antibiotics, 2gm Ancef administered.   The patient was positioned supine on the OSI Hanna table.  Once adequate   padding of boney process was carried out, we had predraped out the hip, and  used fluoroscopy to confirm orientation of the pelvis and position.      The right hip was then prepped and draped from proximal iliac crest to   mid thigh with shower curtain technique.      Time-out was performed identifying the patient, planned procedure, and   extremity.     An incision was then made 2 cm distal and lateral to the   anterior superior iliac spine extending over the orientation of the   tensor fascia lata muscle and sharp dissection was carried down to the   fascia of the muscle and protractor placed in the soft tissues.      The fascia was then incised.  The muscle belly was identified and swept   laterally  and retractor placed along the superior neck.  Following   cauterization of the circumflex vessels and removing some pericapsular   fat, a second cobra retractor was placed on the inferior neck.  A third   retractor was placed on the anterior acetabulum after elevating the   anterior rectus.  A L-capsulotomy was along the line of the   superior neck to the trochanteric fossa, then extended proximally and   distally.  Tag sutures were placed and the retractors were then placed   intracapsular.  We then identified the trochanteric fossa and   orientation of my neck cut, confirmed this radiographically   and then made a neck osteotomy with the femur on traction.  The femoral   head was removed without difficulty or complication.  Traction was let   off and retractors were placed posterior and anterior around the   acetabulum.      The labrum and foveal tissue were debrided.  I began reaming with a 47mm   reamer and reamed up to 53mm reamer with good bony bed preparation and a 54   cup was chosen.  The final 54mm Pinnacle cup was then impacted under fluoroscopy  to confirm the  depth of penetration and orientation with respect to   abduction.  A screw was placed followed by the hole eliminator.  The final   36+4 neutral Altrex liner was impacted with good visualized rim fit.  The cup was positioned anatomically within the acetabular portion of the pelvis.      At this point, the femur was rolled at 80 degrees.  Further capsule was   released off the inferior aspect of the femoral neck.  I then   released the superior capsule proximally.  The hook was placed laterally   along the femur and elevated manually and held in position with the bed   hook.  The leg was then extended and adducted with the leg rolled to 100   degrees of external rotation.  Once the proximal femur was fully   exposed, I used a box osteotome to set orientation.  I then began   broaching with the starting chili pepper broach and passed this by hand and then broached up to 1.  With the high broach in place I chose a high offset neck and did a trial reduction with a 36+1.5 head ball.  The offset was appropriate, leg lengths   appeared to be equal, confirmed radiographically.   Given these findings, I went ahead and dislocated the hip, repositioned all   retractors and positioned the right hip in the extended and abducted position.  The final 1 High offset Tri Lock stem was   chosen and it was impacted down to the level of neck cut.  Based on this   and the trial reduction, a 36+1.5 metal ball was chosen and   impacted onto a clean and dry trunnion, and the hip was reduced.  The   hip had been irrigated throughout the case again at this point.  I did   reapproximate the superior capsular leaflet to the anterior leaflet   using #1 Vicryl, placed a medium Hemovac drain deep.  The fascia of the   tensor fascia lata muscle was then reapproximated using #1 Vicryl.  The   remaining wound was closed with 2-0 Vicryl and running 4-0 Monocryl.   The hip was cleaned, dried, and dressed sterilely using  Dermabond and   Aquacel dressing.  Drain site dressed  separately.  She was then brought   to recovery room in stable condition tolerating the procedure well.    Lanney Gins, PA-C was present for the entirety of the case involved from   preoperative positioning, perioperative retractor management, general   facilitation of the case, as well as primary wound closure as assistant.            Madlyn Frankel Charlann Boxer, M.D.            MDO/MEDQ  D:  05/29/2011  T:  05/29/2011  Job:  [id number]      Electronically Signed by Durene Romans M.D. on 06/04/2011 09:15:38 AM

## 2013-02-10 NOTE — Progress Notes (Signed)
Portable AP Pelvis and Lateral Right Hip X-rays done. 

## 2013-02-11 ENCOUNTER — Encounter (HOSPITAL_COMMUNITY): Payer: Self-pay | Admitting: Orthopedic Surgery

## 2013-02-11 DIAGNOSIS — D500 Iron deficiency anemia secondary to blood loss (chronic): Secondary | ICD-10-CM

## 2013-02-11 LAB — CBC
HCT: 28.5 % — ABNORMAL LOW (ref 36.0–46.0)
Hemoglobin: 9.4 g/dL — ABNORMAL LOW (ref 12.0–15.0)
MCH: 29.3 pg (ref 26.0–34.0)
MCHC: 33 g/dL (ref 30.0–36.0)
MCV: 88.8 fL (ref 78.0–100.0)

## 2013-02-11 LAB — BASIC METABOLIC PANEL
BUN: 16 mg/dL (ref 6–23)
Creatinine, Ser: 1 mg/dL (ref 0.50–1.10)
GFR calc non Af Amer: 56 mL/min — ABNORMAL LOW (ref >90)
Glucose, Bld: 126 mg/dL — ABNORMAL HIGH (ref 70–99)
Potassium: 4.1 mEq/L (ref 3.5–5.1)

## 2013-02-11 MED ORDER — POLYETHYLENE GLYCOL 3350 17 G PO PACK: 17.0000 g | PACK | Freq: Two times a day (BID) | ORAL | Status: DC

## 2013-02-11 MED ORDER — FERROUS SULFATE 325 (65 FE) MG PO TABS: 325.0000 mg | ORAL_TABLET | Freq: Three times a day (TID) | ORAL | Status: DC

## 2013-02-11 MED ORDER — HYDROCODONE-ACETAMINOPHEN 7.5-325 MG PO TABS: 1.0000 | ORAL_TABLET | ORAL | Status: DC | PRN

## 2013-02-11 MED ORDER — PROMETHAZINE HCL 12.5 MG PO TABS: 12.5000 mg | ORAL_TABLET | Freq: Four times a day (QID) | ORAL | Status: DC | PRN

## 2013-02-11 MED ORDER — ASPIRIN 325 MG PO TBEC: 325.0000 mg | DELAYED_RELEASE_TABLET | Freq: Two times a day (BID) | ORAL | Status: AC

## 2013-02-11 MED ORDER — CEFAZOLIN SODIUM-DEXTROSE 2-3 GM-% IV SOLR
2.0000 g | Freq: Four times a day (QID) | INTRAVENOUS | Status: AC
  Administered 2013-02-11 (×2): 2 g via INTRAVENOUS
  Filled 2013-02-11 (×2): qty 50

## 2013-02-11 MED ORDER — DSS 100 MG PO CAPS: 100.0000 mg | ORAL_CAPSULE | Freq: Two times a day (BID) | ORAL | Status: DC

## 2013-02-11 NOTE — Evaluation (Signed)
Physical Therapy Evaluation Patient Details Name: Amber Rogers MRN: [id number] DOB: Sep 22, 1940 Today's Date: 02/11/2013 Time: 8469-6295 PT Time Calculation (min): 30 min  PT Assessment / Plan / Recommendation History of Present Illness  s/p direct anterior THA on R on 02/10/13  Clinical Impression  Pt tolerated ambulation to hallway and back. Pt is very tired. Pt will DC to home of daughter.     PT Assessment  Patient needs continued PT services    Follow Up Recommendations  Home health PT    Does the patient have the potential to tolerate intense rehabilitation      Barriers to Discharge        Equipment Recommendations  Rolling walker with 5" wheels    Recommendations for Other Services     Frequency 7X/week    Precautions / Restrictions Precautions Precautions: None   Pertinent Vitals/Pain 4/10 thigh, ice applied.      Mobility  Bed Mobility Bed Mobility: Supine to Sit;Sitting - Scoot to Edge of Bed Supine to Sit: 4: Min assist Sitting - Scoot to [place] of Bed: 5: Supervision Transfers Transfers: Sit to Stand;Stand to Sit Sit to Stand: 4: Min assist;From bed Stand to Sit: To chair/3-in-1;With upper extremity assist;4: Min guard Details for Transfer Assistance: cues for hand and RLE position Ambulation/Gait Ambulation/Gait Assistance: 4: Min assist Ambulation Distance (Feet): 40 Feet Assistive device: Rolling walker Gait Pattern: Step-to pattern;Decreased step length - right    Exercises Total Joint Exercises Heel Slides: AAROM;Right;10 reps Hip ABduction/ADduction: AAROM;Right;10 reps   PT Diagnosis: Difficulty walking  PT Problem List: Decreased strength;Decreased range of motion;Decreased activity tolerance;Decreased mobility PT Treatment Interventions: DME instruction;Gait training;Stair training;Functional mobility training;Therapeutic activities;Therapeutic exercise;Patient/family education     PT Goals(Current goals can be found in the care plan  section) Acute Rehab PT Goals Patient Stated Goal: To not have pain in leg PT Goal Formulation: With patient/family Time For Goal Achievement: 02/18/13 Potential to Achieve Goals: Good  Visit Information  Last PT Received On: 02/11/13 Assistance Needed: +1 History of Present Illness: s/p direct anterior THA on R on 02/10/13       Prior Functioning  Home Living Family/patient expects to be discharged to:: Private residence (daughter) Available Help at Discharge: Family Type of Home: House Home Access: Stairs to enter Secretary/administrator of Steps: 3 Entrance Stairs-Rails: Left;Right Home Layout: One level Home Equipment: Environmental consultant - 2 wheels;Bedside commode;Cane - single point Additional Comments: DME delivered today Prior Function Level of Independence: Independent with assistive device(s) Communication Communication: No difficulties    Cognition  Cognition Arousal/Alertness: Awake/alert Behavior During Therapy: WFL for tasks assessed/performed Overall Cognitive Status: Within Functional Limits for tasks assessed    Extremity/Trunk Assessment Upper Extremity Assessment Upper Extremity Assessment: Overall WFL for tasks assessed Lower Extremity Assessment Lower Extremity Assessment: RLE deficits/detail RLE Deficits / Details: Pt required assistance to move RLe to edge of bed.   Balance    End of Session PT - End of Session Activity Tolerance: Patient limited by fatigue Patient left: in chair;with family/visitor present;with call bell/phone within reach Nurse Communication: Mobility status  GP     Rada Hay 02/11/2013, 12:43 PM

## 2013-02-11 NOTE — Care Management Note (Addendum)
    Page 1 of 2   02/12/2013     5:54:58 PM   CARE MANAGEMENT NOTE 02/12/2013  Patient:  Amber Rogers, Amber Rogers   Account Number:  [id number]  Date Initiated:  02/11/2013  Documentation initiated by:  Colleen Can  Subjective/Objective Assessment:   dx rt hip osteoarthritis; total hip replacemnt-anterior approach.    Pre-aaranged with Genevieve Norlander for [facility] services upon discharge with 48hrs of d/c.     Action/Plan:   CM spoke with patient and daughter. Plans are for patient to go to her daughter's home initially where daughter will be caregiver. Pt will have [facility] provide HH services.   Anticipated DC Date:  02/12/2013   Anticipated DC Plan:  HOME W HOME HEALTH SERVICES      DC Planning Services  CM consult      [facility] Choice  HOME HEALTH  DURABLE MEDICAL EQUIPMENT   Choice offered to / List presented to:  C-1 Patient   DME arranged  3-N-1  Levan Hurst      DME agency  [facility]     HH arranged  HH-2 PT      [facility] agency  [facility]   Status of service:  Completed, signed off Medicare Important Message given?   (If response is "NO", the following Medicare IM given date fields will be blank) Date Medicare IM given:   Date Additional Medicare IM given:    Discharge Disposition:  HOME W HOME HEALTH SERVICES  Per UR Regulation:    If discussed at Long Length of Stay Meetings, dates discussed:    Comments:

## 2013-02-11 NOTE — Progress Notes (Signed)
   Subjective: 1 Day Post-Op Procedure(s) (LRB): TOTAL HIP ARTHROPLASTY ANTERIOR APPROACH (Right)   Patient reports pain as mild, pain well controlled. A little nauseated this morning, but otherwise no events throughout the night. If she does well with PT and pain stays well controlled she can d/c home.   Objective:   VITALS:   Filed Vitals:   02/11/13 0527  BP: 127/64  Pulse: 54  Temp: 98.4 F (36.9 C)  Resp: 16    Neurovascular intact Dorsiflexion/Plantar flexion intact Incision: dressing C/D/I No cellulitis present Compartment soft  LABS  Recent Labs  02/11/13 0445  HGB 9.4*  HCT 28.5*  WBC 14.4*  PLT 266     Recent Labs  02/11/13 0445  NA 135  K 4.1  BUN 16  CREATININE 1.00  GLUCOSE 126*     Assessment/Plan: 1 Day Post-Op Procedure(s) (LRB): TOTAL HIP ARTHROPLASTY ANTERIOR APPROACH (Right) Foley cath d/c'ed Advance diet Up with therapy D/C IV fluids Discharge home with home health Follow up in 2 weeks at [facility]. Follow up with OLIN,Angas Isabell D in 2 weeks.  Contact information:  [facility] [address], [address] [place] [address] [phone]    Expected ABLA  Treated with iron and will observe     Anastasio Auerbach. Jasmyne Lodato   PAC  02/11/2013, 9:41 AM

## 2013-02-11 NOTE — Progress Notes (Signed)
Pt with approx 75cc of emesis. Medication given. Pt states that she feels somewhat better.

## 2013-02-11 NOTE — Evaluation (Addendum)
Occupational Therapy Evaluation Patient Details Name: Amber Rogers MRN: [id number] DOB: Jul 13, 1941 Today's Date: 02/11/2013 Time: 1227-1250 OT Time Calculation (min): 23 min  OT Assessment / Plan / Recommendation History of present illness s/p direct anterior THA on R on 02/10/13   Clinical Impression   Pt is doing well. Daughter present for part of session. She plans to have daughter help with LB dressing. DME in room. She plans to sponge bathe initially.      OT Assessment  Patient needs continued OT Services    Follow Up Recommendations  No OT follow up;Supervision/Assistance - 24 hour    Barriers to Discharge      Equipment Recommendations  3 in 1 bedside comode (already delivered)    Recommendations for Other Services    Frequency  Min 2X/week    Precautions / Restrictions Precautions Precautions: None Restrictions Weight Bearing Restrictions: No   Pertinent Vitals/Pain 7/10 at rest; 9/10 after activity. Just had pain meds per nursing. Repositioned and ice placed.    ADL  Eating/Feeding: Independent Where Assessed - Eating/Feeding: Chair Grooming: Wash/dry hands;Set up Where Assessed - Grooming: Supported sitting Upper Body Bathing: Chest;Right arm;Left arm;Abdomen;Set up Where Assessed - Upper Body Bathing: Unsupported sitting Lower Body Bathing: Moderate assistance Where Assessed - Lower Body Bathing: Supported sit to stand Upper Body Dressing: Set up Where Assessed - Upper Body Dressing: Unsupported sitting Lower Body Dressing: Moderate assistance Where Assessed - Lower Body Dressing: Supported sit to stand Toilet Transfer: Minimal assistance Statistician Method: Surveyor, minerals: Materials engineer and Hygiene: Minimal assistance Where Assessed - Engineer, mining and Hygiene: Sit to stand from 3-in-1 or toilet Equipment Used: Long-handled shoe horn;Long-handled sponge;Reacher;Rolling  walker ADL Comments: Demonstrateed all AE for pt and daugther. Pt is currently not interested in AE and states her daughter can help her PRN. She is staying with her daughter at d/c. DME delivered to room.    OT Diagnosis: Generalized weakness  OT Problem List: Decreased strength;Decreased knowledge of use of DME or AE OT Treatment Interventions: Self-care/ADL training;DME and/or AE instruction;Patient/family education;Therapeutic activities   OT Goals(Current goals can be found in the care plan section) Acute Rehab OT Goals Patient Stated Goal: decrease pain OT Goal Formulation: With patient Time For Goal Achievement: 02/18/13 Potential to Achieve Goals: Good ADL Goals Pt Will Perform Grooming: with supervision;standing Pt Will Transfer to Toilet: with supervision;ambulating;bedside commode Pt Will Perform Toileting - Clothing Manipulation and hygiene: with supervision;sit to/from stand  Visit Information  Last OT Received On: 02/11/13 Assistance Needed: +1 History of Present Illness: s/p direct anterior THA on R on 02/10/13       Prior Functioning     Home Living Family/patient expects to be discharged to:: Private residence (daughter) Available Help at Discharge: Family Type of Home: House Home Access: Stairs to enter Secretary/administrator of Steps: 3 Entrance Stairs-Rails: Left;Right Home Layout: One level Home Equipment: Environmental consultant - 2 wheels;Bedside commode;Cane - single point Additional Comments: DME delivered today Prior Function Level of Independence: Independent with assistive device(s) Communication Communication: No difficulties         Vision/Perception     Cognition  Cognition Arousal/Alertness: Awake/alert Behavior During Therapy: WFL for tasks assessed/performed Overall Cognitive Status: Within Functional Limits for tasks assessed    Extremity/Trunk Assessment Upper Extremity Assessment Upper Extremity Assessment: Overall WFL for tasks  assessed Lower Extremity Assessment Lower Extremity Assessment: RLE deficits/detail RLE Deficits / Details: Pt required assistance to move RLe  to edge of bed.     Mobility Bed Mobility Bed Mobility: Sit to Supine Supine to Sit: 4: Min assist Sitting - Scoot to Edge of Bed: 5: Supervision Sit to Supine: 4: Min assist;HOB elevated Transfers Transfers: Sit to Stand;Stand to Sit Sit to Stand: 4: Min assist;With upper extremity assist;From chair/3-in-1 Stand to Sit: 4: Min assist;To chair/3-in-1;To bed Details for Transfer Assistance: cues for hand placement and R LE        Balance Balance Balance Assessed: Yes Dynamic Standing Balance Dynamic Standing - Level of Assistance: 4: Min assist   End of Session OT - End of Session Activity Tolerance: Patient tolerated treatment well Patient left: in bed;with call bell/phone within reach  GO     Amber Rogers [phone] 02/11/2013, 1:10 PM

## 2013-02-12 LAB — CBC
HCT: 28.3 % — ABNORMAL LOW (ref 36.0–46.0)
Hemoglobin: 9.4 g/dL — ABNORMAL LOW (ref 12.0–15.0)
MCV: 90.1 fL (ref 78.0–100.0)
Platelets: 279 10*3/uL (ref 150–400)
RBC: 3.14 MIL/uL — ABNORMAL LOW (ref 3.87–5.11)
WBC: 16 10*3/uL — ABNORMAL HIGH (ref 4.0–10.5)

## 2013-02-12 LAB — BASIC METABOLIC PANEL
CO2: 26 mEq/L (ref 19–32)
Chloride: 102 mEq/L (ref 96–112)
Creatinine, Ser: 1.21 mg/dL — ABNORMAL HIGH (ref 0.50–1.10)

## 2013-02-12 NOTE — Progress Notes (Signed)
   Subjective: 2 Days Post-Op Procedure(s) (LRB): TOTAL HIP ARTHROPLASTY ANTERIOR APPROACH (Right)   Patient reports pain as mild, pain well controlled. No events throughout the night. Nausea from yesterday has resolved. Ready to be discharge home.   Objective:   VITALS:   Filed Vitals:   02/12/13 0540  BP: 118/55  Pulse: 60  Temp: 98 F (36.7 C)  Resp: 16    Neurovascular intact Dorsiflexion/Plantar flexion intact Incision: dressing C/D/I No cellulitis present Compartment soft  LABS  Recent Labs  02/11/13 0445 02/12/13 0411  HGB 9.4* 9.4*  HCT 28.5* 28.3*  WBC 14.4* 16.0*  PLT 266 279     Recent Labs  02/11/13 0445 02/12/13 0411  NA 135 136  K 4.1 4.2  BUN 16 20  CREATININE 1.00 1.21*  GLUCOSE 126* 129*     Assessment/Plan: 2 Days Post-Op Procedure(s) (LRB): TOTAL HIP ARTHROPLASTY ANTERIOR APPROACH (Right) Up with therapy Discharge home with home health Follow up in 2 weeks at [facility]. Follow up with OLIN,Jamara Vary D in 2 weeks.  Contact information:  [facility] [address], [address] [place] [address] [phone]    Expected ABLA  Treated with iron and will observe       Anastasio Auerbach. Cristofer Yaffe   PAC  02/12/2013, 9:18 AM

## 2013-02-12 NOTE — Progress Notes (Signed)
Physical Therapy Treatment Patient Details Name: Amber Rogers MRN: [id number] DOB: Nov 11, 1940 Today's Date: 02/12/2013 Time: 4098-1191 PT Time Calculation (min): 25 min  PT Assessment / Plan / Recommendation  PT Comments   POD # 2 R THR Direct Anterior Approach.  Pt plans to D/C to home today to daughter's home.  Practiced stairs and gave HEP.    Follow Up Recommendations  Home health PT     Does the patient have the potential to tolerate intense rehabilitation     Barriers to Discharge        Equipment Recommendations  Rolling walker with 5" wheels    Recommendations for Other Services    Frequency 7X/week   Progress towards PT Goals    Plan      Precautions / Restrictions Precautions Precautions: None Restrictions Weight Bearing Restrictions: No    Pertinent Vitals/Pain C/o 6/10 pain with act Pre medicated  ICE applied    Mobility  Bed Mobility Bed Mobility: Sit to Supine Supine to Sit: 4: Min guard Details for Bed Mobility Assistance: Min Guard assist to support R LE then instructed pt how to use a belt to assist own R LE on/off bed Transfers Transfers: Sit to Stand;Stand to Sit Sit to Stand: 5: Supervision;From bed Stand to Sit: 5: Supervision Details for Transfer Assistance: cues for hand placement and R LE and increased time Ambulation/Gait Ambulation/Gait Assistance: 5: Supervision;4: Min guard Ambulation Distance (Feet): 85 Feet Assistive device: Rolling walker Ambulation/Gait Assistance Details: increased time and 25% VC's on safety with turns and backward gait Gait Pattern: Step-to pattern;Decreased step length - right Gait velocity: decreased Stairs: Yes Stairs Assistance: 4: Min assist Stairs Assistance Details (indicate cue type and reason): with daughter present for family education and 25% VC's on proper tech and sequencing along with handout Stair Management Technique: No rails;With walker     PT Goals (current goals can now be found in the  care plan section)    Visit Information  Last PT Received On: 02/12/13 Assistance Needed: +1 History of Present Illness: s/p direct anterior THA on R on 02/10/13    Subjective Data      Cognition       Balance     End of Session PT - End of Session Equipment Utilized During Treatment: Gait belt Activity Tolerance: Patient tolerated treatment well Patient left: in chair;with family/visitor present;with call bell/phone within reach   Felecia Shelling  PTA [facility]  Acute  Rehab Pager      [phone]

## 2013-02-12 NOTE — Discharge Summary (Signed)
Physician Discharge Summary  Patient ID: Amber Rogers MRN: [id number] DOB/AGE: 12/09/1940 72 y.o.  Admit date: 02/10/2013 Discharge date: 02/12/2013   Procedures:  Procedure(s) (LRB): TOTAL HIP ARTHROPLASTY ANTERIOR APPROACH (Right)  Attending Physician:  Dr. Durene Romans   Admission Diagnoses:   Right hip OA / pain  Discharge Diagnoses:  Principal Problem:   S/P S/P right THA, AA Active Problems:   Expected blood loss anemia  Past Medical History  Diagnosis Date  . Hypertension   . Arthritis   . Asthma   . Pneumonia 1996    HPI:     Amber Rogers, 72 y.o. female, has a history of pain and functional disability in the right hip(s) due to arthritis and patient has failed non-surgical conservative treatments for greater than 12 weeks to include NSAID's and/or analgesics, use of assistive devices and activity modification. Onset of symptoms was gradual starting 1 years ago with rapidlly worsening course since that time.The patient noted no past surgery on the right hip(s). Patient currently rates pain in the right hip at 10 out of 10 with activity. Patient has worsening of pain with activity and weight bearing, trendelenberg gait, pain that interfers with activities of daily living, pain with passive range of motion and worst pain when trying to get up from sitting position. Patient has evidence of periarticular osteophytes and joint space narrowing by imaging studies. This condition presents safety issues increasing the risk of falls. There is no current active signs of infection. Risks, benefits and expectations were discussed with the patient. Patient understand the risks, benefits and expectations and wishes to proceed with surgery.  PCP: Thora Lance, MD   Discharged Condition: good  Hospital Course:  Patient underwent the above stated procedure on 02/10/2013. Patient tolerated the procedure well and brought to the recovery room in good condition and subsequently to the  floor.  POD #1 BP: 127/64 ; Pulse: 54 ; Temp: 98.4 F (36.9 C) ; Resp: 16  Pt's foley was removed, as well as the hemovac drain removed. IV was changed to a saline lock. Patient reports pain as mild, pain well controlled. A little nauseated this morning, but otherwise no events throughout the night. Neurovascular intact, dorsiflexion/plantar flexion intact, incision: dressing C/D/I, no cellulitis present and compartment soft.   LABS  Basename    HGB  9.4  HCT  28.5   POD #2  BP: 118/55 ; Pulse: 60 ; Temp: 98 F (36.7 C) ; Resp: 16  Patient reports pain as mild, pain well controlled. No events throughout the night. Nausea from yesterday has resolved. Ready to be discharge home. Neurovascular intact, dorsiflexion/plantar flexion intact, incision: dressing C/D/I, no cellulitis present and compartment soft.   LABS  Basename    HGB  9.4  HCT  28.3    Discharge Exam: General appearance: alert, cooperative and no distress Extremities: Homans sign is negative, no sign of DVT, no edema, redness or tenderness in the calves or thighs and no ulcers, gangrene or trophic changes  Disposition:   Home or Self Care with follow up in 2 weeks   Follow-up Information   Follow up with [name], MD. Schedule an appointment as soon as possible for a visit in 2 weeks.   Contact information:   [address] [place] [address] [phone]       Discharge Orders   Future Appointments Provider Department Dept Phone   04/08/2013 9:40 AM [facility]  IMAGING [phone]   Patient should wear two piece clothing and wear no powder or deodorant. Patient should arrive 15 minutes early.   Future Orders Complete By Expires     Call MD / Call 911  As directed     Comments:      If you experience chest pain or shortness of breath, CALL 911 and be transported to the hospital emergency room.  If you develope a fever above 101 F, pus (white drainage) or  increased drainage or redness at the wound, or calf pain, call your surgeon's office.    Change dressing  As directed     Comments:      Maintain surgical dressing for 10-14 days, then replace with 4x4 guaze and tape. Keep the area dry and clean.    Constipation Prevention  As directed     Comments:      Drink plenty of fluids.  Prune juice may be helpful.  You may use a stool softener, such as Colace (over the counter) 100 mg twice a day.  Use MiraLax (over the counter) for constipation as needed.    Diet - low sodium heart healthy  As directed     Discharge instructions  As directed     Comments:      Maintain surgical dressing for 10-14 days, then replace with gauze and tape. Keep the area dry and clean until follow up. Follow up in 2 weeks at [facility]. Call with any questions or concerns.    Increase activity slowly as tolerated  As directed     TED hose  As directed     Comments:      Use stockings (TED hose) for 2 weeks on both leg(s).  You may remove them at night for sleeping.    Weight bearing as tolerated  As directed          Medication List    STOP taking these medications       ALEVE 220 MG tablet  Generic drug:  naproxen sodium      TAKE these medications       amLODipine 10 MG tablet  Commonly known as:  NORVASC  Take 10 mg by mouth daily after supper.     aspirin 325 MG EC tablet  Take 1 tablet (325 mg total) by mouth 2 (two) times daily.     BIOFLEX PO  Take by mouth.     DSS 100 MG Caps  Take 100 mg by mouth 2 (two) times daily.     ferrous sulfate 325 (65 FE) MG tablet  Take 1 tablet (325 mg total) by mouth 3 (three) times daily after meals.     Fluticasone-Salmeterol 250-50 MCG/DOSE Aepb  Commonly known as:  ADVAIR  Inhale 2 puffs into the lungs every 12 (twelve) hours.     HYDROcodone-acetaminophen 7.5-325 MG per tablet  Commonly known as:  NORCO  Take 1-2 tablets by mouth every 4 (four) hours as needed for pain.      lisinopril-hydrochlorothiazide 20-25 MG per tablet  Commonly known as:  PRINZIDE,ZESTORETIC  Take 1 tablet by mouth daily after supper.     polyethylene glycol packet  Commonly known as:  MIRALAX / GLYCOLAX  Take 17 g by mouth 2 (two) times daily.     predniSONE 5 MG tablet  Commonly known as:  DELTASONE  Take 5 mg by mouth daily after supper.     promethazine 12.5 MG tablet  Commonly known as:  PHENERGAN  Take 1 tablet (12.5 mg total) by mouth every 6 (six) hours as needed for nausea.     trolamine salicylate 10 % cream  Commonly known as:  ASPERCREME  Apply 1 application topically every morning. Applies to groin area and right hip.         Signed: Anastasio Auerbach. Kizzy Olafson   PAC  02/12/2013, 4:58 PM

## 2013-02-13 NOTE — Progress Notes (Signed)
Discharge summary sent to payer through [facility]  

## 2013-04-08 ENCOUNTER — Ambulatory Visit
Admission: RE | Admit: 2013-04-08 | Discharge: 2013-04-08 | Disposition: A | Discharge status: Home / Self Care | Payer: BC Managed Care – PPO | Source: Ambulatory Visit | Attending: Family Medicine | Admitting: Family Medicine

## 2013-04-08 DIAGNOSIS — Z1231 Encounter for screening mammogram for malignant neoplasm of breast: Secondary | ICD-10-CM

## 2013-05-12 DIAGNOSIS — Z23 Encounter for immunization: Secondary | ICD-10-CM | POA: Diagnosis not present

## 2013-08-10 DIAGNOSIS — R079 Chest pain, unspecified: Secondary | ICD-10-CM | POA: Diagnosis not present

## 2013-08-21 DIAGNOSIS — N183 Chronic kidney disease, stage 3 unspecified: Secondary | ICD-10-CM | POA: Diagnosis not present

## 2013-08-21 DIAGNOSIS — J449 Chronic obstructive pulmonary disease, unspecified: Secondary | ICD-10-CM | POA: Diagnosis not present

## 2013-08-21 DIAGNOSIS — F17200 Nicotine dependence, unspecified, uncomplicated: Secondary | ICD-10-CM | POA: Diagnosis not present

## 2013-08-21 DIAGNOSIS — M1990 Unspecified osteoarthritis, unspecified site: Secondary | ICD-10-CM | POA: Diagnosis not present

## 2013-08-21 DIAGNOSIS — I129 Hypertensive chronic kidney disease with stage 1 through stage 4 chronic kidney disease, or unspecified chronic kidney disease: Secondary | ICD-10-CM | POA: Diagnosis not present

## 2013-11-16 ENCOUNTER — Emergency Department (HOSPITAL_COMMUNITY): Payer: Worker's Compensation

## 2013-11-16 ENCOUNTER — Emergency Department (HOSPITAL_COMMUNITY)
Admission: EM | Admit: 2013-11-16 | Discharge: 2013-11-16 | Disposition: A | Discharge status: Home / Self Care | Payer: Worker's Compensation | Attending: Emergency Medicine | Admitting: Emergency Medicine

## 2013-11-16 ENCOUNTER — Encounter (HOSPITAL_COMMUNITY): Payer: Self-pay | Admitting: Emergency Medicine

## 2013-11-16 DIAGNOSIS — Z23 Encounter for immunization: Secondary | ICD-10-CM | POA: Insufficient documentation

## 2013-11-16 DIAGNOSIS — Y9289 Other specified places as the place of occurrence of the external cause: Secondary | ICD-10-CM | POA: Insufficient documentation

## 2013-11-16 DIAGNOSIS — Z79899 Other long term (current) drug therapy: Secondary | ICD-10-CM | POA: Insufficient documentation

## 2013-11-16 DIAGNOSIS — F17200 Nicotine dependence, unspecified, uncomplicated: Secondary | ICD-10-CM | POA: Insufficient documentation

## 2013-11-16 DIAGNOSIS — T1490XA Injury, unspecified, initial encounter: Secondary | ICD-10-CM | POA: Diagnosis not present

## 2013-11-16 DIAGNOSIS — Z8701 Personal history of pneumonia (recurrent): Secondary | ICD-10-CM | POA: Insufficient documentation

## 2013-11-16 DIAGNOSIS — W1809XA Striking against other object with subsequent fall, initial encounter: Secondary | ICD-10-CM | POA: Insufficient documentation

## 2013-11-16 DIAGNOSIS — S63509A Unspecified sprain of unspecified wrist, initial encounter: Secondary | ICD-10-CM | POA: Insufficient documentation

## 2013-11-16 DIAGNOSIS — S63501A Unspecified sprain of right wrist, initial encounter: Secondary | ICD-10-CM

## 2013-11-16 DIAGNOSIS — S0990XA Unspecified injury of head, initial encounter: Secondary | ICD-10-CM | POA: Diagnosis not present

## 2013-11-16 DIAGNOSIS — Y93E5 Activity, floor mopping and cleaning: Secondary | ICD-10-CM | POA: Insufficient documentation

## 2013-11-16 DIAGNOSIS — Y990 Civilian activity done for income or pay: Secondary | ICD-10-CM | POA: Insufficient documentation

## 2013-11-16 DIAGNOSIS — M129 Arthropathy, unspecified: Secondary | ICD-10-CM | POA: Insufficient documentation

## 2013-11-16 DIAGNOSIS — S0100XA Unspecified open wound of scalp, initial encounter: Secondary | ICD-10-CM | POA: Insufficient documentation

## 2013-11-16 DIAGNOSIS — W010XXA Fall on same level from slipping, tripping and stumbling without subsequent striking against object, initial encounter: Secondary | ICD-10-CM | POA: Insufficient documentation

## 2013-11-16 DIAGNOSIS — I10 Essential (primary) hypertension: Secondary | ICD-10-CM | POA: Insufficient documentation

## 2013-11-16 DIAGNOSIS — IMO0002 Reserved for concepts with insufficient information to code with codable children: Secondary | ICD-10-CM | POA: Insufficient documentation

## 2013-11-16 DIAGNOSIS — S0101XA Laceration without foreign body of scalp, initial encounter: Secondary | ICD-10-CM

## 2013-11-16 DIAGNOSIS — J45909 Unspecified asthma, uncomplicated: Secondary | ICD-10-CM | POA: Insufficient documentation

## 2013-11-16 MED ORDER — TETANUS-DIPHTH-ACELL PERTUSSIS 5-2.5-18.5 LF-MCG/0.5 IM SUSP
0.5000 mL | Freq: Once | INTRAMUSCULAR | Status: AC
  Administered 2013-11-16: 0.5 mL via INTRAMUSCULAR
  Filled 2013-11-16: qty 0.5

## 2013-11-16 NOTE — ED Provider Notes (Addendum)
CSN: [id number]     Arrival date & time 11/16/13  1353 History   First MD Initiated Contact with Patient 11/16/13 1502     Chief Complaint  Patient presents with  . Fall  . Arm Pain  . Head Laceration     (Consider location/radiation/quality/duration/timing/severity/associated sxs/prior Treatment) Patient is a 73 y.o. female presenting with fall, arm pain, and scalp laceration. The history is provided by the patient.  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 The current episode started 1 to 2 hours ago. The problem occurs constantly. The problem has not changed since onset.Pertinent negatives include no headaches. Associated symptoms comments: No neck pain, weakness or numbness.  Right wrist pain.  Denies any leg pain or trouble walking.  No LOC.. Nothing aggravates the symptoms. Nothing relieves the symptoms. She has tried nothing for the symptoms. The treatment provided no relief.  Arm Pain Pertinent negatives include no headaches.  Head Laceration Pertinent negatives include no headaches.    Past Medical History  Diagnosis Date  . Hypertension   . Arthritis   . Asthma   . Pneumonia 1996   Past Surgical History  Procedure Laterality Date  . Right knee arthroscopy  2004  . 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 History  Substance Use Topics  . Smoking status: Current Every Day Smoker -- 25 years    Types: Cigarettes  . Smokeless tobacco: Not on file     Comment: smokes occassionally-1-2 when nervous  . Alcohol Use: No   OB History   Grav Para Term Preterm Abortions TAB SAB Ect Mult Living                 Review of Systems  Neurological: Negative for dizziness, speech difficulty, weakness,  light-headedness, numbness and headaches.  All other systems reviewed and are negative.     Allergies  Review of patient's allergies indicates no known allergies.  Home Medications   Current Outpatient Rx  Name  Route  Sig  Dispense  Refill  . amLODipine (NORVASC) 10 MG tablet   Oral   Take 10 mg by mouth daily after supper.         . celecoxib (CELEBREX) 200 MG capsule   Oral   Take 200 mg by mouth at bedtime.         . Fluticasone-Salmeterol (ADVAIR) 250-50 MCG/DOSE AEPB   Inhalation   Inhale 2 puffs into the lungs every 12 (twelve) hours.         Marland Kitchen lisinopril-hydrochlorothiazide (PRINZIDE,ZESTORETIC) 20-25 MG per tablet   Oral   Take 1 tablet by mouth daily after supper.         . predniSONE (DELTASONE) 5 MG tablet   Oral   Take 5 mg by mouth daily after supper.         Marland Kitchen Bioflavonoid Products (BIOFLEX PO)   Oral   Take 1 tablet by mouth daily.           BP 171/74  Pulse 96  Temp(Src) 98 F (36.7 C) (Oral)  Resp 18  SpO2 96% Physical Exam  Nursing note and vitals reviewed. Constitutional: She is oriented to person, place, and time. She appears well-developed  and well-nourished. No distress.  HENT:  Head: Normocephalic. Head is with laceration.    Eyes: EOM are normal. Pupils are equal, round, and reactive to light.  Neck: No spinous process tenderness and no muscular tenderness present.  Cardiovascular: Normal rate, regular rhythm, normal heart sounds and intact distal pulses.  Exam reveals no friction rub.   No murmur heard. Pulmonary/Chest: Effort normal and breath sounds normal. She has no wheezes. She has no rales.  Abdominal: Soft. Bowel sounds are normal. She exhibits no distension. There is no tenderness. There is no rebound and no guarding.  Musculoskeletal:       Right wrist: She exhibits decreased range of motion, tenderness and bony tenderness. She exhibits no swelling and no effusion.  Pain over the ulnar aspect of the right wrist.   No snuffbox tenderness.  No edema  Neurological: She is alert and oriented to person, place, and time. She has normal strength. No cranial nerve deficit or sensory deficit. Gait normal.  Skin: Skin is warm and dry. No rash noted.  Psychiatric: She has a normal mood and affect. Her behavior is normal.    ED Course  Procedures (including critical care time) Labs Review Labs Reviewed - No data to display Imaging Review Dg Wrist Complete Right  11/16/2013   CLINICAL DATA:  Fall with right wrist pain.  EXAM: RIGHT WRIST - COMPLETE 3+ VIEW  COMPARISON:  DG HAND COMPLETE*R* dated 10/18/2004  FINDINGS: No acute fracture or dislocation is identified. Stable radiocarpal degenerative changes are identified. There is some progression of proliferative changes at the first [facility] and MCP joints.  IMPRESSION: No acute fracture identified.   Electronically Signed   By: Aletta Edouard M.D.   On: 11/16/2013 15:17     EKG Interpretation None     LACERATION REPAIR Performed by: [employer] Authorized by: Blanchie Dessert Consent: Verbal consent obtained. Risks and benefits: risks, benefits and alternatives were discussed Consent given by: patient Patient identity confirmed: provided demographic data Prepped and Draped in normal sterile fashion Wound explored  Laceration Location: scalp  Laceration Length: 2cm  No Foreign Bodies seen or palpated  Anesthesia: local infiltration  Local anesthetic: lidocaine 1% with epinephrine  Anesthetic total: 2 ml  Irrigation method: syringe Amount of cleaning: standard  Skin closure: staples  Number of sutures: 2  Technique: staples  Patient tolerance: Patient tolerated the procedure well with no immediate complications.   MDM   Final diagnoses:  Sprain of wrist, right  Scalp laceration    Patient is here after a mechanical fall at work. She hit her head on a table causing a laceration to the scalp with no LOC. Patient takes no blood  thinners and is now 2 hours out from accident and denies headache, dizziness or any other neurological complaints. Her pupils are reactive and she is able to ambulate without difficulty.  Wound repaired as above and patient given a tetanus shot. She is also complaining of pain to the right wrist imaging was done which was negative. Will treat wrist sprain and placed in a Velcro wrist splint. Patient will need staples removed in 5-7 days.    Blanchie Dessert, MD 11/16/13 [place], MD 11/16/13 [id number]

## 2013-11-16 NOTE — ED Notes (Signed)
Per [facility]  Pt work related injury. "think she tripped" landed on right side. NO LOC. Denies neck and back pain. Laceration to rt side of head. NO obvious to size. Dried blood over laceration per EMS. Pt c/o rt arm pain. Decreased ROM to rt wrist. Good color and sensation. Denies CP/SOB

## 2013-11-16 NOTE — ED Notes (Signed)
MD at bedside. 

## 2014-02-11 ENCOUNTER — Encounter: Payer: Self-pay | Admitting: Interventional Cardiology

## 2014-02-11 DIAGNOSIS — J189 Pneumonia, unspecified organism: Secondary | ICD-10-CM | POA: Insufficient documentation

## 2014-02-11 DIAGNOSIS — J45909 Unspecified asthma, uncomplicated: Secondary | ICD-10-CM | POA: Insufficient documentation

## 2014-02-11 DIAGNOSIS — M1990 Unspecified osteoarthritis, unspecified site: Secondary | ICD-10-CM | POA: Insufficient documentation

## 2014-02-11 DIAGNOSIS — I10 Essential (primary) hypertension: Secondary | ICD-10-CM | POA: Insufficient documentation

## 2014-02-19 DIAGNOSIS — I129 Hypertensive chronic kidney disease with stage 1 through stage 4 chronic kidney disease, or unspecified chronic kidney disease: Secondary | ICD-10-CM | POA: Diagnosis not present

## 2014-02-19 DIAGNOSIS — J449 Chronic obstructive pulmonary disease, unspecified: Secondary | ICD-10-CM | POA: Diagnosis not present

## 2014-02-19 DIAGNOSIS — N183 Chronic kidney disease, stage 3 unspecified: Secondary | ICD-10-CM | POA: Diagnosis not present

## 2014-02-19 DIAGNOSIS — Z1211 Encounter for screening for malignant neoplasm of colon: Secondary | ICD-10-CM | POA: Diagnosis not present

## 2014-02-19 DIAGNOSIS — M1990 Unspecified osteoarthritis, unspecified site: Secondary | ICD-10-CM | POA: Diagnosis not present

## 2014-02-19 DIAGNOSIS — Z23 Encounter for immunization: Secondary | ICD-10-CM | POA: Diagnosis not present

## 2014-02-19 DIAGNOSIS — Z1331 Encounter for screening for depression: Secondary | ICD-10-CM | POA: Diagnosis not present

## 2014-02-19 DIAGNOSIS — F17200 Nicotine dependence, unspecified, uncomplicated: Secondary | ICD-10-CM | POA: Diagnosis not present

## 2014-03-25 DIAGNOSIS — Z1211 Encounter for screening for malignant neoplasm of colon: Secondary | ICD-10-CM | POA: Diagnosis not present

## 2014-05-13 ENCOUNTER — Other Ambulatory Visit: Payer: Self-pay

## 2014-05-13 DIAGNOSIS — Z1231 Encounter for screening mammogram for malignant neoplasm of breast: Secondary | ICD-10-CM

## 2014-05-13 DIAGNOSIS — Z1239 Encounter for other screening for malignant neoplasm of breast: Secondary | ICD-10-CM

## 2014-06-04 ENCOUNTER — Ambulatory Visit: Payer: Self-pay

## 2014-06-25 ENCOUNTER — Ambulatory Visit
Admission: RE | Admit: 2014-06-25 | Discharge: 2014-06-25 | Disposition: A | Discharge status: Home / Self Care | Payer: BC Managed Care – PPO | Source: Ambulatory Visit

## 2014-06-25 DIAGNOSIS — Z1231 Encounter for screening mammogram for malignant neoplasm of breast: Secondary | ICD-10-CM

## 2014-07-19 DIAGNOSIS — Z23 Encounter for immunization: Secondary | ICD-10-CM | POA: Diagnosis not present

## 2015-04-07 DIAGNOSIS — I219 Acute myocardial infarction, unspecified: Secondary | ICD-10-CM

## 2015-04-07 HISTORY — DX: Acute myocardial infarction, unspecified: I21.9

## 2015-04-13 ENCOUNTER — Encounter (HOSPITAL_COMMUNITY): Payer: Self-pay | Admitting: *Deleted

## 2015-04-13 ENCOUNTER — Emergency Department (HOSPITAL_COMMUNITY): Payer: Medicare Other

## 2015-04-13 ENCOUNTER — Inpatient Hospital Stay (HOSPITAL_COMMUNITY)
Admission: EM | Admit: 2015-04-13 | Discharge: 2015-04-16 | DRG: 247 | Disposition: A | Discharge status: Home / Self Care | Payer: Medicare Other | Attending: Cardiovascular Disease | Admitting: Cardiovascular Disease

## 2015-04-13 DIAGNOSIS — R51 Headache: Secondary | ICD-10-CM

## 2015-04-13 DIAGNOSIS — E785 Hyperlipidemia, unspecified: Secondary | ICD-10-CM | POA: Diagnosis present

## 2015-04-13 DIAGNOSIS — Z720 Tobacco use: Secondary | ICD-10-CM

## 2015-04-13 DIAGNOSIS — F40240 Claustrophobia: Secondary | ICD-10-CM | POA: Diagnosis present

## 2015-04-13 DIAGNOSIS — Z7952 Long term (current) use of systemic steroids: Secondary | ICD-10-CM

## 2015-04-13 DIAGNOSIS — Z955 Presence of coronary angioplasty implant and graft: Secondary | ICD-10-CM

## 2015-04-13 DIAGNOSIS — G44209 Tension-type headache, unspecified, not intractable: Secondary | ICD-10-CM | POA: Diagnosis not present

## 2015-04-13 DIAGNOSIS — Z96641 Presence of right artificial hip joint: Secondary | ICD-10-CM | POA: Diagnosis present

## 2015-04-13 DIAGNOSIS — I472 Ventricular tachycardia: Secondary | ICD-10-CM | POA: Diagnosis not present

## 2015-04-13 DIAGNOSIS — N183 Chronic kidney disease, stage 3 unspecified: Secondary | ICD-10-CM

## 2015-04-13 DIAGNOSIS — I214 Non-ST elevation (NSTEMI) myocardial infarction: Secondary | ICD-10-CM | POA: Diagnosis not present

## 2015-04-13 DIAGNOSIS — Z23 Encounter for immunization: Secondary | ICD-10-CM | POA: Diagnosis not present

## 2015-04-13 DIAGNOSIS — I2510 Atherosclerotic heart disease of native coronary artery without angina pectoris: Secondary | ICD-10-CM | POA: Diagnosis not present

## 2015-04-13 DIAGNOSIS — D649 Anemia, unspecified: Secondary | ICD-10-CM | POA: Diagnosis not present

## 2015-04-13 DIAGNOSIS — J45909 Unspecified asthma, uncomplicated: Secondary | ICD-10-CM | POA: Diagnosis present

## 2015-04-13 DIAGNOSIS — R001 Bradycardia, unspecified: Secondary | ICD-10-CM

## 2015-04-13 DIAGNOSIS — R410 Disorientation, unspecified: Secondary | ICD-10-CM | POA: Diagnosis not present

## 2015-04-13 DIAGNOSIS — M1990 Unspecified osteoarthritis, unspecified site: Secondary | ICD-10-CM | POA: Diagnosis present

## 2015-04-13 DIAGNOSIS — I209 Angina pectoris, unspecified: Secondary | ICD-10-CM | POA: Diagnosis not present

## 2015-04-13 DIAGNOSIS — R531 Weakness: Secondary | ICD-10-CM

## 2015-04-13 DIAGNOSIS — I10 Essential (primary) hypertension: Secondary | ICD-10-CM | POA: Diagnosis present

## 2015-04-13 DIAGNOSIS — I674 Hypertensive encephalopathy: Secondary | ICD-10-CM | POA: Diagnosis not present

## 2015-04-13 DIAGNOSIS — Z79899 Other long term (current) drug therapy: Secondary | ICD-10-CM | POA: Diagnosis not present

## 2015-04-13 DIAGNOSIS — I129 Hypertensive chronic kidney disease with stage 1 through stage 4 chronic kidney disease, or unspecified chronic kidney disease: Secondary | ICD-10-CM | POA: Diagnosis present

## 2015-04-13 DIAGNOSIS — F17210 Nicotine dependence, cigarettes, uncomplicated: Secondary | ICD-10-CM | POA: Diagnosis present

## 2015-04-13 DIAGNOSIS — I4729 Other ventricular tachycardia: Secondary | ICD-10-CM

## 2015-04-13 DIAGNOSIS — R079 Chest pain, unspecified: Secondary | ICD-10-CM | POA: Diagnosis not present

## 2015-04-13 DIAGNOSIS — R519 Headache, unspecified: Secondary | ICD-10-CM | POA: Insufficient documentation

## 2015-04-13 LAB — BASIC METABOLIC PANEL
Anion gap: 12 (ref 5–15)
BUN: 18 mg/dL (ref 6–20)
CO2: 25 mmol/L (ref 22–32)
CREATININE: 1.28 mg/dL — AB (ref 0.44–1.00)
Calcium: 9.3 mg/dL (ref 8.9–10.3)
Chloride: 99 mmol/L — ABNORMAL LOW (ref 101–111)
GFR, EST AFRICAN AMERICAN: 47 mL/min — AB (ref >60)
GFR, EST NON AFRICAN AMERICAN: 41 mL/min — AB (ref >60)
Glucose, Bld: 108 mg/dL — ABNORMAL HIGH (ref 65–99)
POTASSIUM: 4.2 mmol/L (ref 3.5–5.1)
SODIUM: 136 mmol/L (ref 135–145)

## 2015-04-13 LAB — I-STAT TROPONIN, ED: Troponin i, poc: 3.56 ng/mL (ref 0.00–0.08)

## 2015-04-13 LAB — TROPONIN I
TROPONIN I: 5.03 ng/mL — AB (ref <0.031)
TROPONIN I: 4.75 ng/mL — AB (ref <0.031)

## 2015-04-13 LAB — CBC
HCT: 40.4 % (ref 36.0–46.0)
Hemoglobin: 13.3 g/dL (ref 12.0–15.0)
MCH: 29.8 pg (ref 26.0–34.0)
MCHC: 32.9 g/dL (ref 30.0–36.0)
MCV: 90.4 fL (ref 78.0–100.0)
PLATELETS: 358 10*3/uL (ref 150–400)
RBC: 4.47 MIL/uL (ref 3.87–5.11)
RDW: 13.4 % (ref 11.5–15.5)
WBC: 13.6 10*3/uL — AB (ref 4.0–10.5)

## 2015-04-13 LAB — TSH: TSH: 1.211 u[IU]/mL (ref 0.350–4.500)

## 2015-04-13 MED ORDER — ONDANSETRON HCL 4 MG/2ML IJ SOLN: 4.0000 mg | Freq: Four times a day (QID) | INTRAMUSCULAR | Status: DC | PRN

## 2015-04-13 MED ORDER — ASPIRIN 81 MG PO CHEW
81.0000 mg | CHEWABLE_TABLET | ORAL | Status: AC
  Administered 2015-04-14: 81 mg via ORAL
  Filled 2015-04-13: qty 1

## 2015-04-13 MED ORDER — NITROGLYCERIN 0.4 MG SL SUBL
0.4000 mg | SUBLINGUAL_TABLET | SUBLINGUAL | Status: DC | PRN
  Administered 2015-04-13 (×2): 0.4 mg via SUBLINGUAL
  Filled 2015-04-13: qty 1

## 2015-04-13 MED ORDER — NITROGLYCERIN 0.4 MG SL SUBL: 0.4000 mg | SUBLINGUAL_TABLET | SUBLINGUAL | Status: DC | PRN

## 2015-04-13 MED ORDER — ACETAMINOPHEN 325 MG PO TABS
650.0000 mg | ORAL_TABLET | ORAL | Status: DC | PRN
  Administered 2015-04-14: 650 mg via ORAL
  Filled 2015-04-13: qty 2

## 2015-04-13 MED ORDER — INFLUENZA VAC SPLIT QUAD 0.5 ML IM SUSY
0.5000 mL | PREFILLED_SYRINGE | INTRAMUSCULAR | Status: AC
  Administered 2015-04-16: 12:00:00 0.5 mL via INTRAMUSCULAR
  Filled 2015-04-13: qty 0.5

## 2015-04-13 MED ORDER — ATORVASTATIN CALCIUM 80 MG PO TABS
80.0000 mg | ORAL_TABLET | Freq: Every day | ORAL | Status: DC
  Administered 2015-04-14 – 2015-04-15 (×2): 80 mg via ORAL
  Filled 2015-04-13 (×2): qty 1

## 2015-04-13 MED ORDER — SODIUM CHLORIDE 0.9 % IJ SOLN: 3.0000 mL | INTRAMUSCULAR | Status: DC | PRN

## 2015-04-13 MED ORDER — MOMETASONE FURO-FORMOTEROL FUM 100-5 MCG/ACT IN AERO
2.0000 | INHALATION_SPRAY | Freq: Two times a day (BID) | RESPIRATORY_TRACT | Status: DC
  Administered 2015-04-14 – 2015-04-15 (×4): 2 via RESPIRATORY_TRACT
  Filled 2015-04-13: qty 8.8

## 2015-04-13 MED ORDER — AMLODIPINE BESYLATE 10 MG PO TABS
10.0000 mg | ORAL_TABLET | Freq: Every day | ORAL | Status: DC
  Administered 2015-04-14 – 2015-04-15 (×2): 10 mg via ORAL
  Filled 2015-04-13 (×2): qty 1

## 2015-04-13 MED ORDER — HEPARIN (PORCINE) IN NACL 100-0.45 UNIT/ML-% IJ SOLN
1000.0000 [IU]/h | INTRAMUSCULAR | Status: DC
  Administered 2015-04-13: 800 [IU]/h via INTRAVENOUS
  Filled 2015-04-13 (×2): qty 250

## 2015-04-13 MED ORDER — PREDNISONE 5 MG PO TABS
5.0000 mg | ORAL_TABLET | Freq: Every day | ORAL | Status: DC
  Administered 2015-04-13 – 2015-04-15 (×3): 5 mg via ORAL
  Filled 2015-04-13 (×3): qty 1

## 2015-04-13 MED ORDER — LISINOPRIL 20 MG PO TABS
20.0000 mg | ORAL_TABLET | Freq: Every day | ORAL | Status: DC
  Administered 2015-04-13: 20 mg via ORAL
  Filled 2015-04-13: qty 1

## 2015-04-13 MED ORDER — SODIUM CHLORIDE 0.9 % IV SOLN: 250.0000 mL | INTRAVENOUS | Status: DC | PRN

## 2015-04-13 MED ORDER — SODIUM CHLORIDE 0.9 % IJ SOLN
3.0000 mL | Freq: Two times a day (BID) | INTRAMUSCULAR | Status: DC
  Administered 2015-04-14: 3 mL via INTRAVENOUS

## 2015-04-13 MED ORDER — HEPARIN BOLUS VIA INFUSION
3500.0000 [IU] | Freq: Once | INTRAVENOUS | Status: AC
  Administered 2015-04-13: 3500 [IU] via INTRAVENOUS
  Filled 2015-04-13: qty 3500

## 2015-04-13 MED ORDER — METOPROLOL TARTRATE 25 MG PO TABS
25.0000 mg | ORAL_TABLET | Freq: Two times a day (BID) | ORAL | Status: DC
Start: 2015-04-13 — End: 2015-04-14
  Administered 2015-04-13 – 2015-04-14 (×2): 25 mg via ORAL
  Filled 2015-04-13 (×2): qty 1

## 2015-04-13 MED ORDER — SODIUM CHLORIDE 0.9 % IV SOLN
INTRAVENOUS | Status: DC
  Administered 2015-04-14: 06:00:00 via INTRAVENOUS

## 2015-04-13 MED ORDER — ASPIRIN 81 MG PO CHEW
324.0000 mg | CHEWABLE_TABLET | Freq: Once | ORAL | Status: AC
  Administered 2015-04-13: 324 mg via ORAL
  Filled 2015-04-13: qty 4

## 2015-04-13 NOTE — ED Notes (Signed)
MD at bedside. [place] 

## 2015-04-13 NOTE — H&P (Signed)
Cardiologist:  New. Amber Rogers is an 74 y.o. female.   Chief Complaint:  Chest pain HPI:   The patient is a 74 year old female with a history of hypertension, asthma pneumonia, arthritis and right total hip replacement.  She reports developing chest pressure approximately 2-3 days ago and lasted all night. Resolved by morning and then returned last night. At its worst it was 10 out of 10. It was associated with radiation to the left arm and diaphoresis. She is currently pain-free on IV heparin.  The patient currently denies nausea, vomiting, fever, shortness of breath, orthopnea, dizziness, PND, cough, congestion, abdominal pain, hematochezia, melena, lower extremity edema, claudication.   Medications:   Medication Sig  amLODipine (NORVASC) 10 MG tablet Take 10 mg by mouth daily after supper.  Fluticasone-Salmeterol (ADVAIR) 250-50 MCG/DOSE AEPB Inhale 1 puff into the lungs every 12 (twelve) hours.   lisinopril-hydrochlorothiazide (PRINZIDE,ZESTORETIC) 20-25 MG per tablet Take 1 tablet by mouth daily after supper.  predniSONE (DELTASONE) 5 MG tablet Take 5 mg by mouth daily after supper.       Past Medical History  Diagnosis Date  . Hypertension   . Arthritis   . Asthma   . Pneumonia 1996    Past Surgical History  Procedure Laterality Date  . Right knee arthroscopy  2004  . 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 Social History:  reports that she has been smoking Cigarettes.  She has smoked for the past 25 years. She does not have any smokeless tobacco history on file. She reports that she does not drink alcohol or use illicit drugs.  Allergies: No Known Allergies   (Not in a hospital admission)  Results for orders placed or performed during the hospital encounter of 04/13/15 (from the past 48  hour(s))  Basic metabolic panel     Status: Abnormal   Collection Time: 04/13/15  4:00 PM  Result Value Ref Range   Sodium 136 135 - 145 mmol/L   Potassium 4.2 3.5 - 5.1 mmol/L   Chloride 99 (L) 101 - 111 mmol/L   CO2 25 22 - 32 mmol/L   Glucose, Bld 108 (H) 65 - 99 mg/dL   BUN 18 6 - 20 mg/dL   Creatinine, Ser 1.28 (H) 0.44 - 1.00 mg/dL   Calcium 9.3 8.9 - 10.3 mg/dL   GFR calc non Af Amer 41 (L) >60 mL/min   GFR calc Af Amer 47 (L) >60 mL/min    Comment: (NOTE) The eGFR has been calculated using the CKD EPI equation. This calculation has not been validated in all clinical situations. eGFR's persistently <60 mL/min signify possible Chronic Kidney Disease.    Anion gap 12 5 - 15  CBC     Status: Abnormal   Collection Time: 04/13/15  4:00 PM  Result Value Ref Range   WBC 13.6 (H) 4.0 - 10.5 K/uL   RBC 4.47 3.87 - 5.11 MIL/uL   Hemoglobin 13.3 12.0 - 15.0 g/dL   HCT 40.4 36.0 - 46.0 %   MCV 90.4 78.0 - 100.0 fL   MCH 29.8 26.0 - 34.0 pg   MCHC 32.9 30.0 - 36.0 g/dL   RDW 13.4 11.5 - 15.5 %   Platelets 358 150 - 400 K/uL  I-stat troponin, ED     Status: Abnormal  Collection Time: 04/13/15  4:11 PM  Result Value Ref Range   Troponin i, poc 3.56 (HH) 0.00 - 0.08 ng/mL   Comment NOTIFIED PHYSICIAN    Comment 3            Comment: Due to the release kinetics of cTnI, a negative result within the first hours of the onset of symptoms does not rule out myocardial infarction with certainty. If myocardial infarction is still suspected, repeat the test at appropriate intervals.    Dg Chest 2 View  04/13/2015   CLINICAL DATA:  Chest pain for a few days  EXAM: CHEST  2 VIEW  COMPARISON:  January 29, 2013  FINDINGS: The heart size and mediastinal contours are within normal limits. There is no focal infiltrate, pulmonary edema, or pleural effusion. There is mild scar of the medial right lung base unchanged. The visualized skeletal structures are unremarkable.  IMPRESSION: No active  cardiopulmonary disease.   Electronically Signed   By: Abelardo Diesel M.D.   On: 04/13/2015 17:15    Review of Systems  Constitutional: Positive for diaphoresis. Negative for fever.  HENT: Negative for congestion and sore throat.   Respiratory: Negative for cough and shortness of breath.   Cardiovascular: Positive for chest pain. Negative for orthopnea, claudication, leg swelling and PND.  Gastrointestinal: Negative for nausea, vomiting, abdominal pain, blood in stool and melena.  Musculoskeletal: Positive for myalgias (Chest pain radiated to the right arm).  Neurological: Negative for dizziness.  All other systems reviewed and are negative.   Blood pressure 137/82, pulse 74, temperature 97 F (36.1 C), temperature source Oral, resp. rate 17, height 5' 6.14" (1.68 m), weight 147 lb (66.679 kg), SpO2 96 %. Physical Exam  Nursing note and vitals reviewed. Constitutional: She is oriented to person, place, and time. She appears well-developed and well-nourished. No distress.  HENT:  Head: Normocephalic and atraumatic.  Mouth/Throat: No oropharyngeal exudate.  Eyes: EOM are normal. Pupils are equal, round, and reactive to light. No scleral icterus.  Neck: Normal range of motion. Neck supple.  Cardiovascular: Normal rate, regular rhythm, S1 normal and S2 normal.   No murmur heard. Pulses:      Radial pulses are 2+ on the right side, and 2+ on the left side.       Dorsalis pedis pulses are 2+ on the right side, and 2+ on the left side.  No carotid bruit  Respiratory: Effort normal and breath sounds normal. She has no wheezes. She has no rales.  GI: Soft. Bowel sounds are normal. She exhibits no distension. There is no tenderness.  Musculoskeletal: She exhibits no edema.  Lymphadenopathy:    She has no cervical adenopathy.  Neurological: She is alert and oriented to person, place, and time. She exhibits normal muscle tone.  Skin: Skin is warm and dry.  Psychiatric: She has a normal mood  and affect.     Assessment/Plan Principal Problem:   NSTEMI (non-ST elevated myocardial infarction) Active Problems:   Hypertension  Patient be admitted to telemetry. She continued IV heparin. Continue lisinopril and amlodipine and start metoprolol 25 mg twice a day. Continue to monitor troponin every 6 hours. Add statin. Check lipids and bmet in the morning.  Patient be scheduled for left heart catheterization and possible PCI tomorrow.  She is currently pain-free.  Tarri Fuller, PA-C 04/13/2015, 6:41 PM   The patient was seen and examined, and I agree with the assessment and plan as documented above, with modifications as noted below.  Patient admitted with stuttering chest pain which began 3 days ago and kept her awake last night and two nights ago. Initial troponin 3.56 consistent with NSTEMI.  Smokes up to 2 cigarettes daily. Has hypertension which is treated, but she says "it stays elevated". Daughter says pt has h/o asthma. ECG shows inferolateral T wave inversions and possible old anterior infarct. Chest xray shows no acute process. Mild leukocytosis. 2 daughters in room.  Recommendations: Check serum troponins q 6 hours. Already on heparin and currently chest pain free. SBP 196 mmHg during my exam. Will restart both lisinopril (will hold diuretic component given creatinine 1.28 with plan for cath) and amlodipine, and start ASA, statin, and beta blocker. Mild leukocytosis likely due to NSTEMI and not infectious process. Plan for coronary angiography on 9/8 and possible PCI, unless she were to become hemodynamically unstable or chest pain could not be controlled with medications, in which case it would be done emergently.  This was explained in detail to the patient and her two daughters and they are in agreement with management strategy.

## 2015-04-13 NOTE — ED Notes (Signed)
Pt reports central chest pain that radiates to left arm. Pt states that the pain started on Friday after eating hotdogs. Pt states that pain was constant through the night and eased. Pain returned after she ate pizza yesterday.

## 2015-04-13 NOTE — ED Notes (Signed)
Pharmacy called because have not received Heparin.

## 2015-04-13 NOTE — Consult Note (Signed)
PHARMACY CONSULT NOTE  Pharmacy Consult :  Heparin Indication : ACS/Chest Pain with + Troponins  Allergies: No Known Allergies  Heparin Dosing weight : 67 kg  Vital Signs: BP 192/90 mmHg  Pulse 69  Temp(Src) 97 F (36.1 C) (Oral)  Resp 16  Ht 5' 6.14" (1.68 m)  Wt 147 lb 11.3 oz (67 kg)  BMI 23.74 kg/m2  SpO2 98%  Active Problems: ACS/chest pain  Labs:  Recent Labs  04/13/15 1600  HGB 13.3  HCT 40.4  PLT 358  CREATININE 1.28*   Lab Results  Component Value Date   INR 0.93 02/02/2013   Estimated Creatinine Clearance: 37.4 mL/min (by C-G formula based on Cr of 1.28).  Medical / Surgical History: Past Medical History  Diagnosis Date  . Hypertension   . Arthritis   . Asthma   . Pneumonia 1996   Past Surgical History  Procedure Laterality Date  . Right knee arthroscopy  2004  . 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 heparin  3,500 Units Intravenous Once   Infusion[s]: Scheduled:  . heparin  3,500 Units Intravenous Once   Antibiotic[s]: Anti-infectives    None      ASSESSMENT:  74 y.o. female admitted for chest pain with Troponins +.  Heparin to be started per Pharmacy consult.  GOAL:  Heparin Level  0.3 - 0.7 units/ml   PLAN: 1. Heparin bolus 3500 units IV now, then  begin Heparin infusion at 800 units/hr.  The next Heparin Level in 8 hours.  2. Daily Heparin level, CBC while on Heparin.  Monitor for bleeding complications. Follow Platelet counts.  Marthenia Rolling,  Pharm.D   04/13/2015,  5:13 PM. 04/13/2015,  5:13 PM

## 2015-04-13 NOTE — ED Notes (Signed)
Family at bedside. 

## 2015-04-13 NOTE — ED Provider Notes (Signed)
CSN: [id number]     Arrival date & time 04/13/15  1546 History   First MD Initiated Contact with Patient 04/13/15 1628     Chief Complaint  Patient presents with  . Chest Pain     (Consider location/radiation/quality/duration/timing/severity/associated sxs/prior Treatment) Patient is a 74 y.o. female presenting with chest pain.  Chest Pain Pain location:  Substernal area and L chest Pain quality: pressure   Pain radiates to:  L arm Pain radiates to the back: no   Pain severity:  Mild Duration:  3 days Timing:  Constant Chronicity:  New Context: no drug use   Relieved by:  None tried Worsened by:  Nothing tried Ineffective treatments:  None tried Associated symptoms: no back pain, no dizziness, no headache, no nausea and not vomiting     Past Medical History  Diagnosis Date  . Hypertension   . Arthritis   . Asthma   . Pneumonia 1996   Past Surgical History  Procedure Laterality Date  . Right knee arthroscopy  2004  . 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 Social History  Substance Use Topics  . Smoking status: Current Every Day Smoker -- 25 years    Types: Cigarettes  . Smokeless tobacco: None     Comment: smokes occassionally-1-2 when nervous  . Alcohol Use: No   OB History    No data available     Review of Systems  HENT: Negative for congestion and ear pain.   Cardiovascular: Positive for chest pain.  Gastrointestinal: Negative for nausea and vomiting.  Endocrine: Negative for polydipsia and polyuria.  Genitourinary: Negative for hematuria and flank pain.  Musculoskeletal: Negative for myalgias and back pain.  Skin: Negative for pallor and rash.  Neurological: Negative for dizziness, syncope and headaches.  All other systems reviewed and are negative.     Allergies  Review of  patient's allergies indicates no known allergies.  Home Medications   Prior to Admission medications   Medication Sig Start Date End Date Taking? Authorizing Provider  amLODipine (NORVASC) 10 MG tablet Take 10 mg by mouth daily after supper.    Historical Provider, MD  Bioflavonoid Products (BIOFLEX PO) Take 1 tablet by mouth daily.     Historical Provider, MD  celecoxib (CELEBREX) 200 MG capsule Take 200 mg by mouth at bedtime.    Historical Provider, MD  Fluticasone-Salmeterol (ADVAIR) 250-50 MCG/DOSE AEPB Inhale 2 puffs into the lungs every 12 (twelve) hours.    Historical Provider, MD  lisinopril-hydrochlorothiazide (PRINZIDE,ZESTORETIC) 20-25 MG per tablet Take 1 tablet by mouth daily after supper.    Historical Provider, MD  predniSONE (DELTASONE) 5 MG tablet Take 5 mg by mouth daily after supper.    Historical Provider, MD   BP 167/89 mmHg  Pulse 86  Temp(Src) 97 F (36.1 C) (Oral)  Resp 16  SpO2 98% Physical Exam  Constitutional: She is oriented to person, place, and time. She appears well-developed and well-nourished.  HENT:  Head: Normocephalic and atraumatic.  Eyes: Conjunctivae and EOM are normal. Right eye exhibits no discharge. Left eye exhibits no discharge.  Cardiovascular: Normal rate and regular rhythm.   Pulmonary/Chest: Effort normal and breath sounds normal. No respiratory distress.  Abdominal: Soft. She exhibits no distension. There is no tenderness. There is no rebound.  Musculoskeletal: Normal range of motion. She  exhibits no edema or tenderness.  Neurological: She is alert and oriented to person, place, and time.  Skin: Skin is warm and dry.  Nursing note and vitals reviewed.   ED Course  Procedures (including critical care time)  CRITICAL CARE Performed by: Merrily Pew   Total critical care time: 35  Critical care time was exclusive of separately billable procedures and treating other patients.  Critical care was necessary to treat or prevent  imminent or life-threatening deterioration.  Critical care was time spent personally by me on the following activities: development of treatment plan with patient and/or surrogate as well as nursing, discussions with consultants, evaluation of patient's response to treatment, examination of patient, obtaining history from patient or surrogate, ordering and performing treatments and interventions, ordering and review of laboratory studies, ordering and review of radiographic studies, pulse oximetry and re-evaluation of patient's condition.   Labs Review Labs Reviewed  CBC - Abnormal; Notable for the following:    WBC 13.6 (*)    All other components within normal limits  I-STAT TROPOININ, ED - Abnormal; Notable for the following:    Troponin i, poc 3.56 (*)    All other components within normal limits  BASIC METABOLIC PANEL    Imaging Review No results found. I have personally reviewed and evaluated these images and lab results as part of my medical decision-making.   EKG Interpretation None      MDM   Final diagnoses:  NSTEMI (non-ST elevated myocardial infarction)   2 separate episodes of crushing substernal chest pain first one on Saturday to 1 today. Incapacitating associated with diaphoresis no other associated symptoms. No recent cough, fever, long travels, surgery. Has a history of hypertension and does have a family history of coronary artery disease. Her troponin was 3.5. EKG was without evidence of STEMI. A second EKG was repeated when she came in the room and was still without STEMI. Discussed heparin with the patient and started it. Also gave her aspirin and nitroglycerin for the pain. This helped per pain improve. I discussed This case with cardiology who was not recommending plavix at this time but will admit.      Merrily Pew, MD 04/14/15 [phone]

## 2015-04-13 NOTE — Progress Notes (Signed)
Patient arrived to floor from ED, has no complaints of chest pain. Lab called critical trop of 5.03. Notified Dr. Claiborne Billings

## 2015-04-13 NOTE — ED Notes (Signed)
Pt taken from triage to xray  

## 2015-04-14 ENCOUNTER — Encounter (HOSPITAL_COMMUNITY)
Admission: EM | Disposition: A | Discharge status: Home / Self Care | Payer: Self-pay | Source: Home / Self Care | Attending: Cardiovascular Disease

## 2015-04-14 DIAGNOSIS — Z720 Tobacco use: Secondary | ICD-10-CM

## 2015-04-14 DIAGNOSIS — I472 Ventricular tachycardia: Secondary | ICD-10-CM

## 2015-04-14 DIAGNOSIS — N183 Chronic kidney disease, stage 3 unspecified: Secondary | ICD-10-CM

## 2015-04-14 DIAGNOSIS — I4729 Other ventricular tachycardia: Secondary | ICD-10-CM

## 2015-04-14 DIAGNOSIS — R001 Bradycardia, unspecified: Secondary | ICD-10-CM

## 2015-04-14 DIAGNOSIS — I2510 Atherosclerotic heart disease of native coronary artery without angina pectoris: Secondary | ICD-10-CM

## 2015-04-14 HISTORY — PX: CARDIAC CATHETERIZATION: SHX172

## 2015-04-14 LAB — CBC
HEMATOCRIT: 38.7 % (ref 36.0–46.0)
HEMOGLOBIN: 12.5 g/dL (ref 12.0–15.0)
MCH: 29.6 pg (ref 26.0–34.0)
MCHC: 32.3 g/dL (ref 30.0–36.0)
MCV: 91.7 fL (ref 78.0–100.0)
Platelets: 323 10*3/uL (ref 150–400)
RBC: 4.22 MIL/uL (ref 3.87–5.11)
RDW: 13.7 % (ref 11.5–15.5)
WBC: 9.3 10*3/uL (ref 4.0–10.5)

## 2015-04-14 LAB — BASIC METABOLIC PANEL
ANION GAP: 7 (ref 5–15)
BUN: 24 mg/dL — ABNORMAL HIGH (ref 6–20)
CHLORIDE: 103 mmol/L (ref 101–111)
CO2: 27 mmol/L (ref 22–32)
Calcium: 9 mg/dL (ref 8.9–10.3)
Creatinine, Ser: 1.47 mg/dL — ABNORMAL HIGH (ref 0.44–1.00)
GFR calc non Af Amer: 34 mL/min — ABNORMAL LOW (ref >60)
GFR, EST AFRICAN AMERICAN: 40 mL/min — AB (ref >60)
Glucose, Bld: 110 mg/dL — ABNORMAL HIGH (ref 65–99)
POTASSIUM: 4.3 mmol/L (ref 3.5–5.1)
Sodium: 137 mmol/L (ref 135–145)

## 2015-04-14 LAB — HEPARIN LEVEL (UNFRACTIONATED)
HEPARIN UNFRACTIONATED: 0.48 [IU]/mL (ref 0.30–0.70)
Heparin Unfractionated: 0.17 IU/mL — ABNORMAL LOW (ref 0.30–0.70)
Heparin Unfractionated: 0.35 IU/mL (ref 0.30–0.70)

## 2015-04-14 LAB — LIPID PANEL
CHOLESTEROL: 244 mg/dL — AB (ref 0–200)
HDL: 104 mg/dL (ref >40)
LDL Cholesterol: 129 mg/dL — ABNORMAL HIGH (ref 0–99)
Total CHOL/HDL Ratio: 2.3 RATIO
Triglycerides: 53 mg/dL (ref <150)
VLDL: 11 mg/dL (ref 0–40)

## 2015-04-14 LAB — MAGNESIUM: MAGNESIUM: 1.9 mg/dL (ref 1.7–2.4)

## 2015-04-14 LAB — POCT ACTIVATED CLOTTING TIME: Activated Clotting Time: 374 seconds

## 2015-04-14 LAB — PROTIME-INR
INR: 1.06 (ref 0.00–1.49)
PROTHROMBIN TIME: 14 s (ref 11.6–15.2)

## 2015-04-14 LAB — TROPONIN I
TROPONIN I: 3.27 ng/mL — AB (ref <0.031)
Troponin I: 3.44 ng/mL (ref <0.031)

## 2015-04-14 SURGERY — LEFT HEART CATH AND CORONARY ANGIOGRAPHY
Anesthesia: LOCAL

## 2015-04-14 MED ORDER — ACETAMINOPHEN 325 MG PO TABS
650.0000 mg | ORAL_TABLET | ORAL | Status: DC | PRN
  Administered 2015-04-15: 650 mg via ORAL
  Filled 2015-04-14: qty 2

## 2015-04-14 MED ORDER — NITROGLYCERIN IN D5W 200-5 MCG/ML-% IV SOLN
INTRAVENOUS | Status: AC
Start: 2015-04-14 — End: 2015-04-14
  Filled 2015-04-14: qty 250

## 2015-04-14 MED ORDER — HEPARIN SODIUM (PORCINE) 1000 UNIT/ML IJ SOLN
INTRAMUSCULAR | Status: DC | PRN
  Administered 2015-04-14: 3000 [IU] via INTRAVENOUS

## 2015-04-14 MED ORDER — SODIUM CHLORIDE 0.9 % IV SOLN: 250.0000 mL | INTRAVENOUS | Status: DC | PRN

## 2015-04-14 MED ORDER — METOPROLOL TARTRATE 12.5 MG HALF TABLET
12.5000 mg | ORAL_TABLET | Freq: Two times a day (BID) | ORAL | Status: DC
  Filled 2015-04-14: qty 1

## 2015-04-14 MED ORDER — SODIUM CHLORIDE 0.9 % IJ SOLN: 3.0000 mL | Freq: Two times a day (BID) | INTRAMUSCULAR | Status: DC

## 2015-04-14 MED ORDER — NITROGLYCERIN IN D5W 200-5 MCG/ML-% IV SOLN
INTRAVENOUS | Status: DC | PRN
  Administered 2015-04-14: 10 ug/min via INTRAVENOUS

## 2015-04-14 MED ORDER — ANGIOPLASTY BOOK
Freq: Once | Status: AC
  Administered 2015-04-14: 22:00:00
  Filled 2015-04-14: qty 1

## 2015-04-14 MED ORDER — BIVALIRUDIN 250 MG IV SOLR
INTRAVENOUS | Status: AC
  Filled 2015-04-14: qty 250

## 2015-04-14 MED ORDER — HEPARIN SODIUM (PORCINE) 1000 UNIT/ML IJ SOLN
INTRAMUSCULAR | Status: AC
  Filled 2015-04-14: qty 1

## 2015-04-14 MED ORDER — HEPARIN BOLUS VIA INFUSION
2000.0000 [IU] | Freq: Once | INTRAVENOUS | Status: AC
  Administered 2015-04-14: 2000 [IU] via INTRAVENOUS
  Filled 2015-04-14: qty 2000

## 2015-04-14 MED ORDER — TICAGRELOR 90 MG PO TABS
ORAL_TABLET | ORAL | Status: AC
  Filled 2015-04-14: qty 2

## 2015-04-14 MED ORDER — VERAPAMIL HCL 2.5 MG/ML IV SOLN
INTRAVENOUS | Status: DC | PRN
  Administered 2015-04-14: 15:00:00 via INTRA_ARTERIAL

## 2015-04-14 MED ORDER — HEPARIN (PORCINE) IN NACL 2-0.9 UNIT/ML-% IJ SOLN
INTRAMUSCULAR | Status: AC
Start: 2015-04-14 — End: 2015-04-14
  Filled 2015-04-14: qty 1000

## 2015-04-14 MED ORDER — BIVALIRUDIN 250 MG IV SOLR
INTRAVENOUS | Status: AC
Start: 2015-04-14 — End: 2015-04-14
  Filled 2015-04-14: qty 250

## 2015-04-14 MED ORDER — BIVALIRUDIN BOLUS VIA INFUSION - CUPID
INTRAVENOUS | Status: DC | PRN
  Administered 2015-04-14: 48.675 mg via INTRAVENOUS

## 2015-04-14 MED ORDER — NITROGLYCERIN 1 MG/10 ML FOR IR/CATH LAB
INTRA_ARTERIAL | Status: DC | PRN
  Administered 2015-04-14: 200 ug via INTRACORONARY

## 2015-04-14 MED ORDER — ATORVASTATIN CALCIUM 80 MG PO TABS: 80.0000 mg | ORAL_TABLET | Freq: Every day | ORAL | Status: DC

## 2015-04-14 MED ORDER — SODIUM CHLORIDE 0.9 % IV SOLN
1.0000 mg/kg/h | INTRAVENOUS | Status: DC
  Filled 2015-04-14 (×2): qty 250

## 2015-04-14 MED ORDER — ASPIRIN EC 81 MG PO TBEC
81.0000 mg | DELAYED_RELEASE_TABLET | Freq: Every day | ORAL | Status: DC
Start: 2015-04-15 — End: 2015-04-14

## 2015-04-14 MED ORDER — SODIUM CHLORIDE 0.9 % IV SOLN
250.0000 mg | INTRAVENOUS | Status: DC | PRN
  Administered 2015-04-14: 1.75 mg/kg/h via INTRAVENOUS

## 2015-04-14 MED ORDER — FENTANYL CITRATE (PF) 100 MCG/2ML IJ SOLN
INTRAMUSCULAR | Status: DC | PRN
  Administered 2015-04-14: 25 ug via INTRAVENOUS

## 2015-04-14 MED ORDER — SODIUM CHLORIDE 0.9 % IJ SOLN: 3.0000 mL | INTRAMUSCULAR | Status: DC | PRN

## 2015-04-14 MED ORDER — NITROGLYCERIN 1 MG/10 ML FOR IR/CATH LAB
INTRA_ARTERIAL | Status: DC | PRN
  Administered 2015-04-14: 16:00:00

## 2015-04-14 MED ORDER — TICAGRELOR 90 MG PO TABS
90.0000 mg | ORAL_TABLET | Freq: Two times a day (BID) | ORAL | Status: DC
Start: 2015-04-15 — End: 2015-04-16
  Administered 2015-04-15 – 2015-04-16 (×3): 90 mg via ORAL
  Filled 2015-04-14 (×3): qty 1

## 2015-04-14 MED ORDER — ASPIRIN 81 MG PO CHEW
81.0000 mg | CHEWABLE_TABLET | Freq: Every day | ORAL | Status: DC
  Administered 2015-04-15 – 2015-04-16 (×2): 81 mg via ORAL
  Filled 2015-04-14 (×2): qty 1

## 2015-04-14 MED ORDER — MORPHINE SULFATE (PF) 2 MG/ML IV SOLN
2.0000 mg | INTRAVENOUS | Status: DC | PRN
  Administered 2015-04-14 – 2015-04-15 (×2): 2 mg via INTRAVENOUS
  Filled 2015-04-14 (×3): qty 1

## 2015-04-14 MED ORDER — MIDAZOLAM HCL 2 MG/2ML IJ SOLN
INTRAMUSCULAR | Status: DC | PRN
  Administered 2015-04-14: 1 mg via INTRAVENOUS

## 2015-04-14 MED ORDER — ONDANSETRON HCL 4 MG/2ML IJ SOLN
4.0000 mg | Freq: Four times a day (QID) | INTRAMUSCULAR | Status: DC | PRN
  Administered 2015-04-15: 13:00:00 4 mg via INTRAVENOUS
  Filled 2015-04-14: qty 2

## 2015-04-14 MED ORDER — LIDOCAINE HCL (PF) 1 % IJ SOLN
INTRAMUSCULAR | Status: AC
  Filled 2015-04-14: qty 30

## 2015-04-14 MED ORDER — NITROGLYCERIN 1 MG/10 ML FOR IR/CATH LAB
INTRA_ARTERIAL | Status: AC
  Filled 2015-04-14: qty 10

## 2015-04-14 MED ORDER — MIDAZOLAM HCL 2 MG/2ML IJ SOLN
INTRAMUSCULAR | Status: AC
  Filled 2015-04-14: qty 4

## 2015-04-14 MED ORDER — ACTIVE PARTNERSHIP FOR HEALTH OF YOUR HEART BOOK
Freq: Once | Status: AC
  Administered 2015-04-15: 04:00:00
  Filled 2015-04-14: qty 1

## 2015-04-14 MED ORDER — VERAPAMIL HCL 2.5 MG/ML IV SOLN
INTRAVENOUS | Status: AC
  Filled 2015-04-14: qty 2

## 2015-04-14 MED ORDER — SODIUM CHLORIDE 0.9 % WEIGHT BASED INFUSION
3.0000 mL/kg/h | INTRAVENOUS | Status: DC
  Administered 2015-04-14: 18:00:00 3 mL/kg/h via INTRAVENOUS

## 2015-04-14 MED ORDER — FENTANYL CITRATE (PF) 100 MCG/2ML IJ SOLN
INTRAMUSCULAR | Status: AC
  Filled 2015-04-14: qty 4

## 2015-04-14 MED ORDER — TICAGRELOR 90 MG PO TABS
ORAL_TABLET | ORAL | Status: DC | PRN
  Administered 2015-04-14: 180 mg via ORAL

## 2015-04-14 MED ORDER — FENTANYL CITRATE (PF) 100 MCG/2ML IJ SOLN
INTRAMUSCULAR | Status: DC | PRN
  Administered 2015-04-14 (×2): 25 ug via INTRAVENOUS

## 2015-04-14 SURGICAL SUPPLY — 19 items
BALLN EMERGE MR 2.0X12 (BALLOONS) ×2
BALLN ~~LOC~~ EMERGE MR 3.75X15 (BALLOONS) ×2
BALLOON EMERGE MR 2.0X12 (BALLOONS) ×1 IMPLANT
BALLOON ~~LOC~~ EMERGE MR 3.75X15 (BALLOONS) ×1 IMPLANT
CATH INFINITI 5 FR JL3.5 (CATHETERS) ×2 IMPLANT
CATH INFINITI 5FR ANG PIGTAIL (CATHETERS) ×2 IMPLANT
CATH INFINITI JR4 5F (CATHETERS) ×2 IMPLANT
CATH VISTA GUIDE 6FR JR4 (CATHETERS) ×2 IMPLANT
DEVICE RAD COMP TR BAND LRG (VASCULAR PRODUCTS) ×2 IMPLANT
GLIDESHEATH SLEND SS 6F .021 (SHEATH) ×2 IMPLANT
KIT ENCORE 26 ADVANTAGE (KITS) ×2 IMPLANT
KIT HEART LEFT (KITS) ×2 IMPLANT
PACK CARDIAC CATHETERIZATION (CUSTOM PROCEDURE TRAY) ×2 IMPLANT
STENT SYNERGY DES 2.75X16 (Permanent Stent) ×2 IMPLANT
STENT XIENCE ALPINE RX 3.5X18 (Permanent Stent) ×2 IMPLANT
TRANSDUCER W/STOPCOCK (MISCELLANEOUS) ×2 IMPLANT
TUBING CIL FLEX 10 FLL-RA (TUBING) ×2 IMPLANT
WIRE ASAHI PROWATER 180CM (WIRE) ×2 IMPLANT
WIRE SAFE-T 1.5MM-J .035X260CM (WIRE) ×2 IMPLANT

## 2015-04-14 NOTE — Progress Notes (Signed)
Utilization review completed. Rand Etchison, RN, BSN. 

## 2015-04-14 NOTE — Interval H&P Note (Signed)
History and Physical Interval Note:  04/14/2015 3:15 PM  Amber Rogers  has presented today for surgery, with the diagnosis of NSTEMI  The various methods of treatment have been discussed with the patient and family. After consideration of risks, benefits and other options for treatment, the patient has consented to  Procedure(s): Left Heart Cath and Coronary Angiography (N/A) and possible angioplasty as a surgical intervention .  The patient's history has been reviewed, patient examined, no change in status, stable for surgery.  I have reviewed the patient's chart and labs.  Questions were answered to the patient's satisfaction.     Bensimhon, Quillian Quince

## 2015-04-14 NOTE — Progress Notes (Signed)
ANTICOAGULATION CONSULT NOTE - Follow Up Consult  Pharmacy Consult for Heparin Indication: NSTEMI  No Known Allergies  Patient Measurements: Height: 5\' 4"  (162.6 cm) Weight: 143 lb (64.864 kg) IBW/kg (Calculated) : 54.7 Heparin Dosing Weight: 64 kg  Vital Signs: Temp: 97.7 F (36.5 C) (09/08 1142) Temp Source: Oral (09/08 1142) BP: 122/71 mmHg (09/08 1142) Pulse Rate: 57 (09/08 1142)  Labs:  Recent Labs  04/13/15 1600  04/13/15 2125 04/14/15 0055 04/14/15 0500 04/14/15 0918 04/14/15 1137  HGB 13.3  --   --   --  12.5  --   --   HCT 40.4  --   --   --  38.7  --   --   PLT 358  --   --   --  323  --   --   LABPROT  --   --   --   --  14.0  --   --   INR  --   --   --   --  1.06  --   --   HEPARINUNFRC  --   --   --  0.17* 0.48  --  0.35  CREATININE 1.28*  --   --   --  1.47*  --   --   TROPONINI  --   < > 4.75*  --  3.44* 3.27*  --   < > = values in this interval not displayed.  Estimated Creatinine Clearance: 29.9 mL/min (by C-G formula based on Cr of 1.47).   Assessment: 74 y.o. female on heparin for r/o ACS. Heparin level therapeutic on 1000 units/hr. No issues with line or bleeding reported per RN. Plan for cath today.  Goal of Therapy:  Heparin level 0.3-0.7 units/ml Monitor platelets by anticoagulation protocol: Yes   Plan:  Continue heparin gtt at 1000 units/hr Follow- up after cath  [facility], Pharm.D., BCPS Clinical Pharmacist Pager [phone] 04/14/2015 1:32 PM

## 2015-04-14 NOTE — H&P (View-Only) (Signed)
Patient: Amber Rogers / Admit Date: 04/13/2015 / Date of Encounter: 04/14/2015, 11:07 AM   Subjective: No CP or SOB. Had mild headache earlier, relieved with Tylenol.   Objective: Telemetry: NSR/SB (HR down to 42-44 at times, asymptomatic), with 7 beats NSVT this AM Physical Exam: Blood pressure 124/80, pulse 56, temperature 98.5 F (36.9 C), temperature source Oral, resp. rate 18, height 5\' 4"  (1.626 m), weight 143 lb (64.864 kg), SpO2 96 %. General: Well developed, well nourished WF in no acute distress. Head: Normocephalic, atraumatic, sclera non-icteric, no xanthomas, nares are without discharge. Neck: Negative for carotid bruits. JVP not elevated. Lungs: Clear bilaterally to auscultation without wheezes, rales, or rhonchi. Breathing is unlabored. Heart: RRR S1 S2 without murmurs, rubs, or gallops.  Abdomen: Soft, non-tender, non-distended with normoactive bowel sounds. No rebound/guarding. Extremities: No clubbing or cyanosis. No edema. Distal pedal pulses are 2+ and equal bilaterally. Neuro: Alert and oriented X 3. Moves all extremities spontaneously. Psych:  Responds to questions appropriately with a normal affect.  No intake or output data in the 24 hours ending 04/14/15 1107  Inpatient Medications:  . amLODipine  10 mg Oral QPC supper  . atorvastatin  80 mg Oral q1800  . Influenza vac split quadrivalent PF  0.5 mL Intramuscular Tomorrow-1000  . metoprolol tartrate  25 mg Oral BID  . mometasone-formoterol  2 puff Inhalation BID  . predniSONE  5 mg Oral QPC supper  . sodium chloride  3 mL Intravenous Q12H   Infusions:  . sodium chloride 75 mL/hr at 04/14/15 0855  . heparin 1,000 Units/hr (04/14/15 0235)    Labs:  Recent Labs  04/13/15 1600 04/14/15 0500  NA 136 137  K 4.2 4.3  CL 99* 103  CO2 25 27  GLUCOSE 108* 110*  BUN 18 24*  CREATININE 1.28* 1.47*  CALCIUM 9.3 9.0   No results for input(s): AST, ALT, ALKPHOS, BILITOT, PROT, ALBUMIN in the last 72  hours.  Recent Labs  04/13/15 1600 04/14/15 0500  WBC 13.6* 9.3  HGB 13.3 12.5  HCT 40.4 38.7  MCV 90.4 91.7  PLT 358 323    Recent Labs  04/13/15 1922 04/13/15 2125 04/14/15 0500 04/14/15 0918  TROPONINI 5.03* 4.75* 3.44* 3.27*   Invalid input(s): POCBNP No results for input(s): HGBA1C in the last 72 hours.   Radiology/Studies:  Dg Chest 2 View  04/13/2015   CLINICAL DATA:  Chest pain for a few days  EXAM: CHEST  2 VIEW  COMPARISON:  January 29, 2013  FINDINGS: The heart size and mediastinal contours are within normal limits. There is no focal infiltrate, pulmonary edema, or pleural effusion. There is mild scar of the medial right lung base unchanged. The visualized skeletal structures are unremarkable.  IMPRESSION: No active cardiopulmonary disease.   Electronically Signed   By: Abelardo Diesel M.D.   On: 04/13/2015 17:15     Assessment and Plan  61M with HTN, asthma, arthritis, ongoing tobacco abuse and no prior cardiac history admitted with chest pain and NSTEMI.   1. NSTEMI - plan cath today (verified with MDs OK with mild Cr rise). Risks and benefits of cardiac catheterization have been discussed with the patient.  These include bleeding, infection, kidney damage, stroke, heart attack, death.  The patient understands these risks and is willing to proceed. Continue aspirin, statin, heparin, BB (but reduce dose due to sinus bradycardia). Will order echocardiogram to evaluate LV function in case MD wants to conserve dye use and avoid LV  gram by cath.  2. Tobacco abuse - cessation advised.  3. HTN - controlled at present time; follow off llisinopril and HCTZ.   4. Renal insufficiency, question CKD stage III - ACEI/HCTZ on hold. Currently being hydrated. F/u during hospitalization.  5. Sinus bradycardia - asymptomatic. Will reduce BB dose. Hold parameters in place. TSH WNL.  6. NSVT - K OK. Add Mg level. May be 2/2 #1. Will order echo.  Signed, Melina Copa PA-C Pager:  [phone]

## 2015-04-14 NOTE — Interval H&P Note (Signed)
Cath Lab Visit (complete for each Cath Lab visit)  Clinical Evaluation Leading to the Procedure:   ACS: Yes.    Non-ACS:    Anginal Classification: CCS IV  Anti-ischemic medical therapy: No Therapy  Non-Invasive Test Results: No non-invasive testing performed  Prior CABG: No previous CABG      History and Physical Interval Note:  04/14/2015 3:52 PM  [name]  has presented today for surgery, with the diagnosis of NSTEMI  The various methods of treatment have been discussed with the patient and family. After consideration of risks, benefits and other options for treatment, the patient has consented to  Procedure(s): Left Heart Cath and Coronary Angiography (N/A) Coronary Stent Intervention (N/A) as a surgical intervention .  The patient's history has been reviewed, patient examined, no change in status, stable for surgery.  I have reviewed the patient's chart and labs.  Questions were answered to the patient's satisfaction.     Quay Burow

## 2015-04-14 NOTE — Progress Notes (Signed)
ANTICOAGULATION CONSULT NOTE - Follow Up Consult  Pharmacy Consult for Heparin Indication: NSTEMI  No Known Allergies  Patient Measurements: Height: 5\' 4"  (162.6 cm) Weight: 143 lb (64.864 kg) IBW/kg (Calculated) : 54.7 Heparin Dosing Weight: 64 kg  Vital Signs: Temp: 98.3 F (36.8 C) (09/08 0019) Temp Source: Oral (09/08 0019) BP: 124/61 mmHg (09/08 0019) Pulse Rate: 52 (09/08 0019)  Labs:  Recent Labs  04/13/15 1600 04/13/15 1922 04/13/15 2125 04/14/15 0055  HGB 13.3  --   --   --   HCT 40.4  --   --   --   PLT 358  --   --   --   HEPARINUNFRC  --   --   --  0.17*  CREATININE 1.28*  --   --   --   TROPONINI  --  5.03* 4.75*  --     Estimated Creatinine Clearance: 34.3 mL/min (by C-G formula based on Cr of 1.28).   Assessment: 74 y.o. female on heparin for r/o ACS. Heparin level subtherapeutic (0.17) on 800 units/hr. No issues with line or bleeding reported per RN. Plan for cath today.  Goal of Therapy:  Heparin level 0.3-0.7 units/ml Monitor platelets by anticoagulation protocol: Yes   Plan:  Rebolus heparin 2000 units Increase heparin gtt to 1000 units/hr Will f/u 8 hour heparin level  Sherlon Handing, PharmD, BCPS Clinical pharmacist, pager [phone] 04/14/2015,2:16 AM

## 2015-04-14 NOTE — Progress Notes (Signed)
Patient had 7 beat run of V-tach, returned to Sinus [place] Hr 48-50. Resting in bed. No chest pain. Notified Dr. Claiborne Billings.

## 2015-04-14 NOTE — Care Management Note (Signed)
Case Management Note  Patient Details  Name: NIGERIA LASSETER MRN: [id number] Date of Birth: 04-10-1941  Subjective/Objective:  Pt admitted for chest Pian- Nstemi. Plan for cardiac cath.                   Action/Plan: CM will continue to monitor post procedure for disposition/ medication needs.    Expected Discharge Date:                  Expected Discharge Plan:  Home/Self Care  In-House Referral:     Discharge planning Services  CM Consult  Post Acute Care Choice:    Choice offered to:     DME Arranged:    DME Agency:     HH Arranged:    HH Agency:     Status of Service:  In process, will continue to follow  Medicare Important Message Given:    Date Medicare IM Given:    Medicare IM give by:    Date Additional Medicare IM Given:    Additional Medicare Important Message give by:     If discussed at [place] of Stay Meetings, dates discussed:    Additional Comments:  Bethena Roys, RN 04/14/2015, 12:34 PM

## 2015-04-14 NOTE — Progress Notes (Signed)
Patient: Amber Rogers / Admit Date: 04/13/2015 / Date of Encounter: 04/14/2015, 11:07 AM   Subjective: No CP or SOB. Had mild headache earlier, relieved with Tylenol.   Objective: Telemetry: NSR/SB (HR down to 42-44 at times, asymptomatic), with 7 beats NSVT this AM Physical Exam: Blood pressure 124/80, pulse 56, temperature 98.5 F (36.9 C), temperature source Oral, resp. rate 18, height 5\' 4"  (1.626 m), weight 143 lb (64.864 kg), SpO2 96 %. General: Well developed, well nourished WF in no acute distress. Head: Normocephalic, atraumatic, sclera non-icteric, no xanthomas, nares are without discharge. Neck: Negative for carotid bruits. JVP not elevated. Lungs: Clear bilaterally to auscultation without wheezes, rales, or rhonchi. Breathing is unlabored. Heart: RRR S1 S2 without murmurs, rubs, or gallops.  Abdomen: Soft, non-tender, non-distended with normoactive bowel sounds. No rebound/guarding. Extremities: No clubbing or cyanosis. No edema. Distal pedal pulses are 2+ and equal bilaterally. Neuro: Alert and oriented X 3. Moves all extremities spontaneously. Psych:  Responds to questions appropriately with a normal affect.  No intake or output data in the 24 hours ending 04/14/15 1107  Inpatient Medications:  . amLODipine  10 mg Oral QPC supper  . atorvastatin  80 mg Oral q1800  . Influenza vac split quadrivalent PF  0.5 mL Intramuscular Tomorrow-1000  . metoprolol tartrate  25 mg Oral BID  . mometasone-formoterol  2 puff Inhalation BID  . predniSONE  5 mg Oral QPC supper  . sodium chloride  3 mL Intravenous Q12H   Infusions:  . sodium chloride 75 mL/hr at 04/14/15 0855  . heparin 1,000 Units/hr (04/14/15 0235)    Labs:  Recent Labs  04/13/15 1600 04/14/15 0500  NA 136 137  K 4.2 4.3  CL 99* 103  CO2 25 27  GLUCOSE 108* 110*  BUN 18 24*  CREATININE 1.28* 1.47*  CALCIUM 9.3 9.0   No results for input(s): AST, ALT, ALKPHOS, BILITOT, PROT, ALBUMIN in the last 72  hours.  Recent Labs  04/13/15 1600 04/14/15 0500  WBC 13.6* 9.3  HGB 13.3 12.5  HCT 40.4 38.7  MCV 90.4 91.7  PLT 358 323    Recent Labs  04/13/15 1922 04/13/15 2125 04/14/15 0500 04/14/15 0918  TROPONINI 5.03* 4.75* 3.44* 3.27*   Invalid input(s): POCBNP No results for input(s): HGBA1C in the last 72 hours.   Radiology/Studies:  Dg Chest 2 View  04/13/2015   CLINICAL DATA:  Chest pain for a few days  EXAM: CHEST  2 VIEW  COMPARISON:  January 29, 2013  FINDINGS: The heart size and mediastinal contours are within normal limits. There is no focal infiltrate, pulmonary edema, or pleural effusion. There is mild scar of the medial right lung base unchanged. The visualized skeletal structures are unremarkable.  IMPRESSION: No active cardiopulmonary disease.   Electronically Signed   By: Abelardo Diesel M.D.   On: 04/13/2015 17:15     Assessment and Plan  64M with HTN, asthma, arthritis, ongoing tobacco abuse and no prior cardiac history admitted with chest pain and NSTEMI.   1. NSTEMI - plan cath today (verified with MDs OK with mild Cr rise). Risks and benefits of cardiac catheterization have been discussed with the patient.  These include bleeding, infection, kidney damage, stroke, heart attack, death.  The patient understands these risks and is willing to proceed. Continue aspirin, statin, heparin, BB (but reduce dose due to sinus bradycardia). Will order echocardiogram to evaluate LV function in case MD wants to conserve dye use and avoid LV  gram by cath.  2. Tobacco abuse - cessation advised.  3. HTN - controlled at present time; follow off llisinopril and HCTZ.   4. Renal insufficiency, question CKD stage III - ACEI/HCTZ on hold. Currently being hydrated. F/u during hospitalization.  5. Sinus bradycardia - asymptomatic. Will reduce BB dose. Hold parameters in place. TSH WNL.  6. NSVT - K OK. Add Mg level. May be 2/2 #1. Will order echo.  Signed, Melina Copa PA-C Pager:  [phone]

## 2015-04-15 ENCOUNTER — Inpatient Hospital Stay (HOSPITAL_COMMUNITY): Payer: Medicare Other

## 2015-04-15 ENCOUNTER — Encounter (HOSPITAL_COMMUNITY): Payer: Self-pay | Admitting: Internal Medicine

## 2015-04-15 DIAGNOSIS — I674 Hypertensive encephalopathy: Secondary | ICD-10-CM

## 2015-04-15 DIAGNOSIS — G44039 Episodic paroxysmal hemicrania, not intractable: Secondary | ICD-10-CM

## 2015-04-15 DIAGNOSIS — R4701 Aphasia: Secondary | ICD-10-CM

## 2015-04-15 DIAGNOSIS — R079 Chest pain, unspecified: Secondary | ICD-10-CM

## 2015-04-15 LAB — CBC
HEMATOCRIT: 33.3 % — AB (ref 36.0–46.0)
HEMOGLOBIN: 10.9 g/dL — AB (ref 12.0–15.0)
MCH: 30.3 pg (ref 26.0–34.0)
MCHC: 32.7 g/dL (ref 30.0–36.0)
MCV: 92.5 fL (ref 78.0–100.0)
Platelets: 262 10*3/uL (ref 150–400)
RBC: 3.6 MIL/uL — ABNORMAL LOW (ref 3.87–5.11)
RDW: 13.6 % (ref 11.5–15.5)
WBC: 9.3 10*3/uL (ref 4.0–10.5)

## 2015-04-15 LAB — BASIC METABOLIC PANEL
Anion gap: 7 (ref 5–15)
BUN: 22 mg/dL — AB (ref 6–20)
CALCIUM: 8.5 mg/dL — AB (ref 8.9–10.3)
CHLORIDE: 106 mmol/L (ref 101–111)
CO2: 26 mmol/L (ref 22–32)
CREATININE: 1.32 mg/dL — AB (ref 0.44–1.00)
GFR calc Af Amer: 45 mL/min — ABNORMAL LOW (ref >60)
GFR calc non Af Amer: 39 mL/min — ABNORMAL LOW (ref >60)
GLUCOSE: 108 mg/dL — AB (ref 65–99)
Potassium: 4.6 mmol/L (ref 3.5–5.1)
Sodium: 139 mmol/L (ref 135–145)

## 2015-04-15 LAB — HEPATIC FUNCTION PANEL
ALK PHOS: 57 U/L (ref 38–126)
ALT: 11 U/L — ABNORMAL LOW (ref 14–54)
AST: 16 U/L (ref 15–41)
Albumin: 2.9 g/dL — ABNORMAL LOW (ref 3.5–5.0)
BILIRUBIN TOTAL: 0.6 mg/dL (ref 0.3–1.2)
Total Protein: 5.4 g/dL — ABNORMAL LOW (ref 6.5–8.1)

## 2015-04-15 LAB — HEMOGLOBIN A1C
HEMOGLOBIN A1C: 6 % — AB (ref 4.8–5.6)
MEAN PLASMA GLUCOSE: 126 mg/dL

## 2015-04-15 LAB — GLUCOSE, CAPILLARY: Glucose-Capillary: 94 mg/dL (ref 65–99)

## 2015-04-15 MED ORDER — SODIUM CHLORIDE 0.9 % IV SOLN
INTRAVENOUS | Status: DC
  Administered 2015-04-15: 13:00:00 via INTRAVENOUS

## 2015-04-15 MED ORDER — BUTALBITAL-APAP-CAFFEINE 50-325-40 MG PO TABS
1.0000 | ORAL_TABLET | Freq: Once | ORAL | Status: AC
  Administered 2015-04-15: 13:00:00 1 via ORAL
  Filled 2015-04-15: qty 1

## 2015-04-15 MED ORDER — TRAMADOL HCL 50 MG PO TABS
50.0000 mg | ORAL_TABLET | Freq: Two times a day (BID) | ORAL | Status: DC | PRN
  Administered 2015-04-15: 15:00:00 50 mg via ORAL
  Filled 2015-04-15: qty 1

## 2015-04-15 NOTE — Progress Notes (Signed)
*  PRELIMINARY RESULTS* Echocardiogram 2D Echocardiogram has been performed.  Amber Rogers 04/15/2015, 10:26 AM

## 2015-04-15 NOTE — Progress Notes (Signed)
Patient: Amber Rogers / Admit Date: 04/13/2015 / Date of Encounter: 04/15/2015, 8:11 AM   Subjective: Was a little nauseated this AM around breakfast but she thinks this is because this one of the few meals she's had in a while (was NPO for majority of day yesterday). Feels fine now. No CP or SOB.   Objective: Telemetry: SB/NSR (HR down to upper 30s overnight), occasionally 40s-50s - now around 60bpm Physical Exam: Blood pressure 150/59, pulse 46, temperature 98 F (36.7 C), temperature source Oral, resp. rate 20, height 5\' 4"  (1.626 m), weight 150 lb 9.2 oz (68.3 kg), SpO2 95 %. General: Well developed, well nourished WF in no acute distress. Head: Normocephalic, atraumatic, sclera non-icteric, no xanthomas, nares are without discharge. Neck: Negative for carotid bruits. JVP not elevated. Lungs: Clear bilaterally to auscultation without wheezes, rales, or rhonchi. Breathing is unlabored. Heart: RRR S1 S2 without murmurs, rubs, or gallops.  Abdomen: Soft, non-tender, non-distended with normoactive bowel sounds. No rebound/guarding. Extremities: No clubbing or cyanosis. No edema. Distal pedal pulses are 2+ and equal bilaterally. Right radial cath site with ecchymosis noted proximally up wrist but soft; no hematoma, good pulse. Neuro: Alert and oriented X 3. Moves all extremities spontaneously. Psych: Responds to questions appropriately with a normal affect.   Intake/Output Summary (Last 24 hours) at 04/15/15 0811 Last data filed at 04/15/15 0810  Gross per 24 hour  Intake 1626.54 ml  Output      0 ml  Net 1626.54 ml    Inpatient Medications:  . amLODipine  10 mg Oral QPC supper  . aspirin  81 mg Oral Daily  . atorvastatin  80 mg Oral q1800  . Influenza vac split quadrivalent PF  0.5 mL Intramuscular Tomorrow-1000  . metoprolol tartrate  12.5 mg Oral BID  . mometasone-formoterol  2 puff Inhalation BID  . predniSONE  5 mg Oral QPC supper  . ticagrelor  90 mg Oral BID    Infusions:    Labs:  Recent Labs  04/14/15 0500 04/14/15 0918 04/15/15 0322  NA 137  --  139  K 4.3  --  4.6  CL 103  --  106  CO2 27  --  26  GLUCOSE 110*  --  108*  BUN 24*  --  22*  CREATININE 1.47*  --  1.32*  CALCIUM 9.0  --  8.5*  MG  --  1.9  --     Recent Labs  04/14/15 0500 04/15/15 0322  WBC 9.3 9.3  HGB 12.5 10.9*  HCT 38.7 33.3*  MCV 91.7 92.5  PLT 323 262    Recent Labs  04/13/15 1922 04/13/15 2125 04/14/15 0500 04/14/15 0918  TROPONINI 5.03* 4.75* 3.44* 3.27*   Invalid input(s): POCBNP  Recent Labs  04/13/15 2125  HGBA1C 6.0*     Radiology/Studies:  Dg Chest 2 View  04/13/2015   CLINICAL DATA:  Chest pain for a few days  EXAM: CHEST  2 VIEW  COMPARISON:  January 29, 2013  FINDINGS: The heart size and mediastinal contours are within normal limits. There is no focal infiltrate, pulmonary edema, or pleural effusion. There is mild scar of the medial right lung base unchanged. The visualized skeletal structures are unremarkable.  IMPRESSION: No active cardiopulmonary disease.   Electronically Signed   By: Abelardo Diesel M.D.   On: 04/13/2015 17:15     Assessment and Plan  89F with HTN, asthma, arthritis, ongoing tobacco abuse and no prior cardiac history admitted with chest  pain and NSTEMI.   1. NSTEMI/CAD - s/p DES to [facility], s/p DES to dRCA. Continue aspirin and statin. No BB given sinus bradycardia (HR 40s-50s without BB). Will f/u echocardiogram this AM to evaluate LV function.  2. Tobacco abuse - cessation advised.  3. HTN - hypertensive yesterday after cath requiring NTG gtt, but now more controlled. Lisinopril/HCTZ on hold post cath. Await echo results to help guide med rx. Unable to add or titrate AVN blocking agents due to sinus bradycardia. Right now she's only on amlodipine.   4. Renal insufficiency, question CKD stage III - Cr stable post-cath with ACEI/HCTZ on hold. Will need to f/u PCP for monitoring/evaluation as she was previously  unaware of this diagnosis.  5. Sinus bradycardia - did not get BB last night at all due to HR in the 40s. HR still 40s-50s intermittently early this AM; asymptomatic. D/c metoprolol.  6. NSVT on 9/8 - likely 2/2 #1. Now quiescent.  7. Anemia - may be procedurally related. No evidence of bleeding. We can f/u as outpatient.  Signed, Melina Copa PA-C Pager: [phone]

## 2015-04-15 NOTE — Progress Notes (Deleted)
BP 200/80 in right arm by manual method.

## 2015-04-15 NOTE — Progress Notes (Signed)
CM spoke with pt regarding Brilinta and provided pt with booklet with 30 day free card enclosed. Card usage explained per CM and pt verbally stated understanding of how to use card. [place] @ [phone] called per CM to confirm medication is in stock, pt made aware. Benefits check in process, CM will inform pt of results when available. Pt requesting to speak with CSW regarding medicaid application process,  CSW referral placed per CM. CM to f/u with disposition needs. Whitman Hero RN,BSN,CM [phone]

## 2015-04-15 NOTE — Progress Notes (Signed)
CARDIAC REHAB PHASE I   PRE:  Rate/Rhythm: 64 SR  BP:  Supine: 138/57  Sitting:   Standing:    SaO2:   MODE:  Ambulation: 300 ft   POST:  Rate/Rhythm: 74 SR  BP:  Supine:   Sitting: 143/59  Standing:    SaO2: 97%RA 0752-0905 Pt weak with walk. Stopped several times to rest. Held to side rail as she stated legs wobbly. C/o pressure in chest from breathing and stated needed inhaler. Walked 300 ft with asst x1 and tired by end of walk. MI education completed with pt who voiced understanding. Discussed CRP 2 and referring to [facility]. Discussed smoking cessation and handout given. Did not want fake cigarette. Stated daughters to quit also. Discussed heart healthy food choices. RN in and gave inhaler.   Graylon Good, RN BSN  04/15/2015 9:01 AM

## 2015-04-15 NOTE — Progress Notes (Addendum)
BPs have been followed closely this afternoon. Per neurology, allowing permissive hypertension and gradual reduction of BP. There was a nursing note listed at 3:15pm about BP 200/80. I spoke with Dr. Debara Pickett regarding this and based on that value we considered adding oral hydralazine -- but it turns out this note was actually an Epic glitch reporting a BP from much earlier. I clarified with nurse that BP has since begun trending down into the 335K/56Y-56L systolic. Will hold off adding further meds. She is due for amlodipine coming up in the next 2-3 hours thus will continue to follow closely. If needed we can add oral hydralazine later. Appreciate neurology team's help with her today. Dayna Dunn PA-C

## 2015-04-15 NOTE — Code Documentation (Signed)
74yo female admitted on 04/13/2015 with chest pain.  Patient underwent cardiac cath yesterday with DES x2.  Patient with confusion post-procedure on 04/14/2015.  Today family arrived at 0900 and patient was confused which they report was worse from yesterday.  Patient reporting headache and nausea.  Code stroke called.  Stroke team to the bedside.  Patient transported to CT and tolerated exam.  Patient back to 6C03.  NIHSS 2, see documentation for details and code stroke times.  Patient having difficulty understanding and following commands during exam.  Patient with expressive aphasia at times, having difficulty explaining the NIHSS cards without constant direction.  Patient also only reading the left side of the words on the NIHSS cards, however, no field cut assessed during visual exam.  Dr. Erlinda Hong at the bedside.  MD assessed patient.  Patient continues to be hypertensive.  Unclear LKW.  No acute stroke treatment per Dr. Erlinda Hong.  Will treat headache.  Stroke swallow screen performed and patient passed.  Handoff with Clair Gulling, RN, regarding plan of care.

## 2015-04-15 NOTE — Progress Notes (Signed)
Called to patient's room at 0915 secondary to severe HA, 8/10, which the patient described as a rare event.  Given IV morphine.  Morphine not effective, so tylenol was given, also with no affect.  Patient HTNsive at this time as noted in previous notes and on flowsheets.  Neuro check grossly unremarkable initially, mild bilateral upper extremity weakness noted.  Melina Copa, PA, made aware. Further follow up with family revealed their feeling that she has "not been herself" since hospitalization.  Further, the patient could not sate the year and showed hesitancy in responding generally.  I requested an exam by the rapid response RN, Hella, which resulted in calling a code stroke.  Stroke team responded promptly, as did Melina Copa, [place] and [place]i Hilty, MD.  Patient examined by Dr. Erlinda Hong and sent promptly to CT scan.  Continuing to closely monitor patient.  Continued to medicate for HA and nausea.  IVF started.  At this time, nausea is relieved and HA has reduced to 3/10.  BP improving.  Patient seems much more comfortable.  Family at bedside.

## 2015-04-15 NOTE — Research (Signed)
Patient had expressed interest in participating in the TWILIGHT research study yesterday. Currently the research department does not have any available TWILIGHT study drug and the patient can not be enrolled. Patient/family verbalize understanding.

## 2015-04-15 NOTE — Clinical Social Work Note (Signed)
CSW received consult to talk with family about Medicare. Amber Rogers' daughters were at the bedside and had questions about patient being able to get medications and Medicare/Medicaid's assistance with meds. Discussion was had and CSW encouraged daughter to take patient to apply for Medicaid and if eligible CSW explained how Medicaid can assist patient with her medications. Daughters very [employer] attorney of CSW's information.   Amber Rogers, MSW, LCSW Licensed Clinical Social Worker [place] [phone]

## 2015-04-15 NOTE — Progress Notes (Signed)
TR BAND REMOVAL  LOCATION:    right radial  DEFLATED PER PROTOCOL:    Yes.    TIME BAND OFF / DRESSING APPLIED:    0000   SITE UPON ARRIVAL:    Level 1  SITE AFTER BAND REMOVAL:    Level 1  REVERSE ALLEN'S TEST:     positive  CIRCULATION SENSATION AND MOVEMENT:    Within Normal Limits   Yes.    COMMENTS:   Pt tolerated removal of TR band without complication, will continue to monitor patient.

## 2015-04-15 NOTE — Consult Note (Signed)
Referring Physician: code stroke    Chief Complaint: confusion, headache and speech difficulty  HPI: Amber Rogers is an 74 y.o. female with hx of HTN and asthma was admitted for chest pain on 04/13/15. She had elevated troponin and ruled in for NSTEMI. Had cardaic cath yesterday and was placed stent x 2. Post cath, she was doing fine but was reported to have some confusion last night. This morning, she stated that she also messed up some orientation questions. Just before the breakfast, she started to have left frontal headache, sharp and throbbing, with nausea but no vomiting, no photophobia or phonophobia. BP was up to 210/70 and she was found to have words out difficulties and hesitancy. No weakness, numbness, visual changes, LOC or seizure. Code stroke was called. NIHSS=1   LSN: 04/14/15 night NIHSS = 1 for "best language" tPA Given: No: out of window, low NIHSS and stroke is not likely the cause  Past Medical History  Diagnosis Date  . Hypertension   . Arthritis   . Asthma   . Pneumonia 1996    Past Surgical History  Procedure Laterality Date  . Right knee arthroscopy  2004  . Total hip arthroplasty Right 02/10/2013    Procedure: TOTAL HIP ARTHROPLASTY ANTERIOR APPROACH;  Surgeon: [name], MD;  Location: [facility];  Service: Orthopedics;  Laterality: Right;  . Cardiac catheterization N/A 04/14/2015    Procedure: Left Heart Cath and Coronary Angiography;  Surgeon: Jolaine Artist, MD;  Location: [facility] [place] [facility];  Service: Cardiovascular;  Laterality: N/A;  . Cardiac catheterization N/A 04/14/2015    Procedure: Coronary Stent Intervention;  Surgeon: Lorretta Harp, MD;  Location: [facility];  Service: Cardiovascular;  Laterality: N/A;    No family history on file. Social History:  reports that she has been smoking Cigarettes.  She has smoked for the past 25 years. She does not have any smokeless tobacco history on file. She reports that she does not drink alcohol or use  illicit drugs.  Allergies: No Known Allergies  Medications:  No current facility-administered medications on file prior to encounter.   Current Outpatient Prescriptions on File Prior to Encounter  Medication Sig Dispense Refill  . amLODipine (NORVASC) 10 MG tablet Take 10 mg by mouth daily after supper.    . Fluticasone-Salmeterol (ADVAIR) 250-50 MCG/DOSE AEPB Inhale 1 puff into the lungs every 12 (twelve) hours.     Marland Kitchen lisinopril-hydrochlorothiazide (PRINZIDE,ZESTORETIC) 20-25 MG per tablet Take 1 tablet by mouth daily after supper.    . predniSONE (DELTASONE) 5 MG tablet Take 5 mg by mouth daily after supper.      ROS: Review of systems performed in 14 systems and all negative except mentioned in HPI.  Physical Examination:  Temp:  [97 F (36.1 C)-98.8 F (37.1 C)] 98.8 F (37.1 C) (09/09 1200) Pulse Rate:  [0-67] 67 (09/09 1117) Resp:  [0-35] 20 (09/09 1117) BP: (80-215)/(41-104) 210/70 mmHg (09/09 1117) SpO2:  [0 %-100 %] 98 % (09/09 1117) Weight:  [150 lb 9.2 oz (68.3 kg)] 150 lb 9.2 oz (68.3 kg) (09/09 0334)  General - Well nourished, well developed, anxious and in mild distress due to headahce.  Ophthalmologic - Fundi not visualized due to noncooperation.  Cardiovascular - Regular rate and rhythm with no murmur.  Mental Status -  Level of arousal and orientation to place, year and month. Language including naming, repetition, comprehension was assessed and found intact, fluent speech, but with occasional mild hesitancy and  word finding difficulties. Fund of Knowledge was assessed and was intact.  Cranial Nerves II - XII - II - Visual field intact OU. III, IV, VI - Extraocular movements intact. V - Facial sensation intact bilaterally. VII - Facial movement intact bilaterally. VIII - Hearing & vestibular intact bilaterally. X - Palate elevates symmetrically. XI - Chin turning & shoulder shrug intact bilaterally. XII - Tongue protrusion intact.  Motor Strength -  The patient's strength was normal in all extremities and pronator drift was absent.  Bulk was normal and fasciculations were absent.   Motor Tone - Muscle tone was assessed at the neck and appendages and was normal.  Reflexes - The patient's reflexes were 1+ in all extremities and she had no pathological reflexes.  Sensory - Light touch, temperature/pinprick, vibration and proprioception, and Romberg testing were assessed and were symmetrical.    Coordination - The patient had normal movements in the hands with no ataxia or dysmetria.  Tremor was absent.  Gait and Station - deferred.  Level of Consciousness (1a. ) 0: Alert, keenly responsive (09/09 1219) LOC Questions (1b. ) 0: Answers both questions correctly (09/09 1219) LOC Commands (1c. ) 0: Performs both tasks correctly (09/09 1219) Best Gaze (2. ) 0: Normal (09/09 1219) Visual (3. ) 0: No visual loss (09/09 1219) Facial Palsy (4. ) 0: Normal symmetrical movements (09/09 1219) Motor Arm, Left (5a. ) 0: No drift (09/09 1219) Motor Arm, Right (5b. ) 0: No drift (09/09 1219) Motor Leg, Left (6a. ) 0: No drift (09/09 1219) Motor Leg, Right (6b. ) 0: No drift (09/09 1219) Limb Ataxia (7. ) 0: Absent (09/09 1219) Sensory (8. ) 0: Normal, no sensory loss (09/09 1219) Best Language (9. ) 1: Mild-to-moderate aphasia (09/09 1219) Dysarthria (10. ) 0: Mild-to-moderate dysarthria, patient slurs at least some words and, at worst, can be understood with some difficulty (09/09 1219) Inattention/Extinction 0: No Abnormality (09/09 1219) Total: 2 (09/09 1219)    Results for orders placed or performed during the hospital encounter of 04/13/15 (from the past 48 hour(s))  Basic metabolic panel     Status: Abnormal   Collection Time: 04/13/15  4:00 PM  Result Value Ref Range   Sodium 136 135 - 145 mmol/L   Potassium 4.2 3.5 - 5.1 mmol/L   Chloride 99 (L) 101 - 111 mmol/L   CO2 25 22 - 32 mmol/L   Glucose, Bld 108 (H) 65 - 99 mg/dL   BUN 18 6 -  20 mg/dL   Creatinine, Ser 1.28 (H) 0.44 - 1.00 mg/dL   Calcium 9.3 8.9 - 10.3 mg/dL   GFR calc non Af Amer 41 (L) >60 mL/min   GFR calc Af Amer 47 (L) >60 mL/min    Comment: (NOTE) The eGFR has been calculated using the CKD EPI equation. This calculation has not been validated in all clinical situations. eGFR's persistently <60 mL/min signify possible Chronic Kidney Disease.    Anion gap 12 5 - 15  CBC     Status: Abnormal   Collection Time: 04/13/15  4:00 PM  Result Value Ref Range   WBC 13.6 (H) 4.0 - 10.5 K/uL   RBC 4.47 3.87 - 5.11 MIL/uL   Hemoglobin 13.3 12.0 - 15.0 g/dL   HCT 40.4 36.0 - 46.0 %   MCV 90.4 78.0 - 100.0 fL   MCH 29.8 26.0 - 34.0 pg   MCHC 32.9 30.0 - 36.0 g/dL   RDW 13.4 11.5 - 15.5 %  Platelets 358 150 - 400 K/uL  I-stat troponin, ED     Status: Abnormal   Collection Time: 04/13/15  4:11 PM  Result Value Ref Range   Troponin i, poc 3.56 (HH) 0.00 - 0.08 ng/mL   Comment NOTIFIED PHYSICIAN    Comment 3            Comment: Due to the release kinetics of cTnI, a negative result within the first hours of the onset of symptoms does not rule out myocardial infarction with certainty. If myocardial infarction is still suspected, repeat the test at appropriate intervals.   Troponin I     Status: Abnormal   Collection Time: 04/13/15  7:22 PM  Result Value Ref Range   Troponin I 5.03 (HH) <0.031 ng/mL    Comment:        POSSIBLE MYOCARDIAL ISCHEMIA. SERIAL TESTING RECOMMENDED. CRITICAL RESULT CALLED TO, READ BACK BY AND VERIFIED WITH: [facility] 2036 04/13/2015 WBOND   TSH     Status: None   Collection Time: 04/13/15  9:25 PM  Result Value Ref Range   TSH 1.211 0.350 - 4.500 uIU/mL  Troponin I     Status: Abnormal   Collection Time: 04/13/15  9:25 PM  Result Value Ref Range   Troponin I 4.75 (HH) <0.031 ng/mL    Comment:        POSSIBLE MYOCARDIAL ISCHEMIA. SERIAL TESTING RECOMMENDED. CRITICAL VALUE NOTED.  VALUE IS CONSISTENT WITH  PREVIOUSLY REPORTED AND CALLED VALUE.   Hemoglobin A1c     Status: Abnormal   Collection Time: 04/13/15  9:25 PM  Result Value Ref Range   Hgb A1c MFr Bld 6.0 (H) 4.8 - 5.6 %    Comment: (NOTE)         Pre-diabetes: 5.7 - 6.4         Diabetes: >6.4         Glycemic control for adults with diabetes: <7.0    Mean Plasma Glucose 126 mg/dL    Comment: (NOTE) Performed At: [facility] [place], [place] [id number] Lindon Romp MD WU:[id number]   Heparin level (unfractionated)     Status: Abnormal   Collection Time: 04/14/15 12:55 AM  Result Value Ref Range   Heparin Unfractionated 0.17 (L) 0.30 - 0.70 IU/mL    Comment:        IF HEPARIN RESULTS ARE BELOW EXPECTED VALUES, AND PATIENT DOSAGE HAS BEEN CONFIRMED, SUGGEST FOLLOW UP TESTING OF ANTITHROMBIN III LEVELS.   Heparin level (unfractionated)     Status: None   Collection Time: 04/14/15  5:00 AM  Result Value Ref Range   Heparin Unfractionated 0.48 0.30 - 0.70 IU/mL    Comment:        IF HEPARIN RESULTS ARE BELOW EXPECTED VALUES, AND PATIENT DOSAGE HAS BEEN CONFIRMED, SUGGEST FOLLOW UP TESTING OF ANTITHROMBIN III LEVELS.   CBC     Status: None   Collection Time: 04/14/15  5:00 AM  Result Value Ref Range   WBC 9.3 4.0 - 10.5 K/uL   RBC 4.22 3.87 - 5.11 MIL/uL   Hemoglobin 12.5 12.0 - 15.0 g/dL   HCT 38.7 36.0 - 46.0 %   MCV 91.7 78.0 - 100.0 fL   MCH 29.6 26.0 - 34.0 pg   MCHC 32.3 30.0 - 36.0 g/dL   RDW 13.7 11.5 - 15.5 %   Platelets 323 150 - 400 K/uL  Troponin I     Status: Abnormal   Collection Time: 04/14/15  5:00 AM  Result Value Ref Range   Troponin I 3.44 (HH) <0.031 ng/mL    Comment:        POSSIBLE MYOCARDIAL ISCHEMIA. SERIAL TESTING RECOMMENDED. CRITICAL VALUE NOTED.  VALUE IS CONSISTENT WITH PREVIOUSLY REPORTED AND CALLED VALUE.   Lipid panel     Status: Abnormal   Collection Time: 04/14/15  5:00 AM  Result Value Ref Range   Cholesterol 244 (H) 0 - 200 mg/dL    Triglycerides 53 <150 mg/dL   HDL 104 >40 mg/dL   Total CHOL/HDL Ratio 2.3 RATIO   VLDL 11 0 - 40 mg/dL   LDL Cholesterol 129 (H) 0 - 99 mg/dL    Comment:        Total Cholesterol/HDL:CHD Risk Coronary Heart Disease Risk Table                     Men   Women  1/2 Average Risk   3.4   3.3  Average Risk       5.0   4.4  2 X Average Risk   9.6   7.1  3 X Average Risk  23.4   11.0        Use the calculated Patient Ratio above and the CHD Risk Table to determine the patient's CHD Risk.        ATP III CLASSIFICATION (LDL):  <100     mg/dL   Optimal  100-129  mg/dL   Near or Above                    Optimal  130-159  mg/dL   Borderline  160-189  mg/dL   High  >190     mg/dL   Very High   Basic metabolic panel     Status: Abnormal   Collection Time: 04/14/15  5:00 AM  Result Value Ref Range   Sodium 137 135 - 145 mmol/L   Potassium 4.3 3.5 - 5.1 mmol/L   Chloride 103 101 - 111 mmol/L   CO2 27 22 - 32 mmol/L   Glucose, Bld 110 (H) 65 - 99 mg/dL   BUN 24 (H) 6 - 20 mg/dL   Creatinine, Ser 1.47 (H) 0.44 - 1.00 mg/dL   Calcium 9.0 8.9 - 10.3 mg/dL   GFR calc non Af Amer 34 (L) >60 mL/min   GFR calc Af Amer 40 (L) >60 mL/min    Comment: (NOTE) The eGFR has been calculated using the CKD EPI equation. This calculation has not been validated in all clinical situations. eGFR's persistently <60 mL/min signify possible Chronic Kidney Disease.    Anion gap 7 5 - 15  Protime-INR     Status: None   Collection Time: 04/14/15  5:00 AM  Result Value Ref Range   Prothrombin Time 14.0 11.6 - 15.2 seconds   INR 1.06 0.00 - 1.49  Troponin I     Status: Abnormal   Collection Time: 04/14/15  9:18 AM  Result Value Ref Range   Troponin I 3.27 (HH) <0.031 ng/mL    Comment:        POSSIBLE MYOCARDIAL ISCHEMIA. SERIAL TESTING RECOMMENDED. CRITICAL VALUE NOTED.  VALUE IS CONSISTENT WITH PREVIOUSLY REPORTED AND CALLED VALUE.   Magnesium     Status: None   Collection Time: 04/14/15  9:18 AM   Result Value Ref Range   Magnesium 1.9 1.7 - 2.4 mg/dL  Heparin level (unfractionated)     Status: None   Collection Time:  04/14/15 11:37 AM  Result Value Ref Range   Heparin Unfractionated 0.35 0.30 - 0.70 IU/mL    Comment:        IF HEPARIN RESULTS ARE BELOW EXPECTED VALUES, AND PATIENT DOSAGE HAS BEEN CONFIRMED, SUGGEST FOLLOW UP TESTING OF ANTITHROMBIN III LEVELS.   POCT Activated clotting time     Status: None   Collection Time: 04/14/15  3:54 PM  Result Value Ref Range   Activated Clotting Time 374 seconds  CBC     Status: Abnormal   Collection Time: 04/15/15  3:22 AM  Result Value Ref Range   WBC 9.3 4.0 - 10.5 K/uL   RBC 3.60 (L) 3.87 - 5.11 MIL/uL   Hemoglobin 10.9 (L) 12.0 - 15.0 g/dL   HCT 33.3 (L) 36.0 - 46.0 %   MCV 92.5 78.0 - 100.0 fL   MCH 30.3 26.0 - 34.0 pg   MCHC 32.7 30.0 - 36.0 g/dL   RDW 13.6 11.5 - 15.5 %   Platelets 262 150 - 400 K/uL  Basic metabolic panel     Status: Abnormal   Collection Time: 04/15/15  3:22 AM  Result Value Ref Range   Sodium 139 135 - 145 mmol/L   Potassium 4.6 3.5 - 5.1 mmol/L   Chloride 106 101 - 111 mmol/L   CO2 26 22 - 32 mmol/L   Glucose, Bld 108 (H) 65 - 99 mg/dL   BUN 22 (H) 6 - 20 mg/dL   Creatinine, Ser 1.32 (H) 0.44 - 1.00 mg/dL   Calcium 8.5 (L) 8.9 - 10.3 mg/dL   GFR calc non Af Amer 39 (L) >60 mL/min   GFR calc Af Amer 45 (L) >60 mL/min    Comment: (NOTE) The eGFR has been calculated using the CKD EPI equation. This calculation has not been validated in all clinical situations. eGFR's persistently <60 mL/min signify possible Chronic Kidney Disease.    Anion gap 7 5 - 15  Hepatic function panel     Status: Abnormal   Collection Time: 04/15/15  3:22 AM  Result Value Ref Range   Total Protein 5.4 (L) 6.5 - 8.1 g/dL   Albumin 2.9 (L) 3.5 - 5.0 g/dL   AST 16 15 - 41 U/L   ALT 11 (L) 14 - 54 U/L   Alkaline Phosphatase 57 38 - 126 U/L   Total Bilirubin 0.6 0.3 - 1.2 mg/dL   Bilirubin, Direct <0.1 (L) 0.1 -  0.5 mg/dL   Indirect Bilirubin NOT CALCULATED 0.3 - 0.9 mg/dL   I have personally reviewed the radiological images below and agree with the radiology interpretations.  Dg Chest 2 View  04/13/2015    IMPRESSION: No active cardiopulmonary disease.     Ct Head Wo Contrast  04/15/2015   IMPRESSION: Negative for acute hemorrhage.  Scattered white matter disease likely represent chronic small vessel ischemic changes. Area of low density involving the left internal capsule suggests an insult of unknown age.    Assessment: 74 y.o. female with hx of HTN and asthma was admitted for chest pain on 04/13/15. She had elevated troponin and ruled in for NSTEMI. Had cardaic cath yesterday and was placed stent x 2. Develop mild confusion last night and HA, confusion and speech difficulty this morning and code stroke called. BP was high at 210. NIHSS = 1 and CT head no acute abnormalities. No ICH. No tPA due to out of window, low NIHSS and her etiology for this most likely encephalopathy due to HTN and  HA. Pt clostrophobic for MRI, will need repeat CT head in am, and proceed with CUS. Continue dural antiplatlelet and statin. Quit smoking. fioricet for HA.  Stroke Risk Factors - hypertension and CAD s/p stent and NSTMI  Plan: 1. Repeat CT in am 2. Carotid dopplers 3. Continue ASA and brilinta  4. Continue lipitor 76m 5. Treat HA with fioricet once 6. Quit smoking 7. Telemetry monitoring and neuro checks 8. Gradually normalize BP. 9. Adequate rest and decrease stress levels.  This patient is critically due to code stroke and acute onset stroke like symptoms, hypertensive urgency and STEMI and at significant risk of neurological worsening, death form MI, heart failure, stroke and hypertensive emergency. This patient's care requires constant monitoring of vital signs, hemodynamics, respiratory and cardiac monitoring, review of multiple databases, neurological assessment, discussion with family, other specialists  and medical decision making of high complexity. I spent 40 minutes of neurocritical care time in the care of this patient.   JRosalin Hawking MD PhD Stroke Neurology 04/15/2015 1:23 PM

## 2015-04-15 NOTE — Progress Notes (Signed)
Pt without any prescription coverage. CM provided pt/daughter with [place] application to help assist with Brilinta. and [employer]'s application to possibly help with other medication needs. Explanation given to pt/daughter regarding application process. Pt/daughter very appreciative and encouraged with information given. No other needs identified @ present time per CM. Whitman Hero RN, [place] [phone]

## 2015-04-16 ENCOUNTER — Inpatient Hospital Stay (HOSPITAL_COMMUNITY): Payer: Medicare Other

## 2015-04-16 DIAGNOSIS — G44209 Tension-type headache, unspecified, not intractable: Secondary | ICD-10-CM

## 2015-04-16 DIAGNOSIS — R51 Headache: Secondary | ICD-10-CM

## 2015-04-16 DIAGNOSIS — I214 Non-ST elevation (NSTEMI) myocardial infarction: Secondary | ICD-10-CM | POA: Diagnosis not present

## 2015-04-16 LAB — BASIC METABOLIC PANEL
ANION GAP: 7 (ref 5–15)
BUN: 16 mg/dL (ref 6–20)
CALCIUM: 8 mg/dL — AB (ref 8.9–10.3)
CO2: 22 mmol/L (ref 22–32)
Chloride: 108 mmol/L (ref 101–111)
Creatinine, Ser: 1.25 mg/dL — ABNORMAL HIGH (ref 0.44–1.00)
GFR, EST AFRICAN AMERICAN: 49 mL/min — AB (ref >60)
GFR, EST NON AFRICAN AMERICAN: 42 mL/min — AB (ref >60)
Glucose, Bld: 109 mg/dL — ABNORMAL HIGH (ref 65–99)
Potassium: 4.1 mmol/L (ref 3.5–5.1)
SODIUM: 137 mmol/L (ref 135–145)

## 2015-04-16 LAB — CBC
HEMATOCRIT: 31.6 % — AB (ref 36.0–46.0)
Hemoglobin: 10.2 g/dL — ABNORMAL LOW (ref 12.0–15.0)
MCH: 29.8 pg (ref 26.0–34.0)
MCHC: 32.3 g/dL (ref 30.0–36.0)
MCV: 92.4 fL (ref 78.0–100.0)
PLATELETS: 244 10*3/uL (ref 150–400)
RBC: 3.42 MIL/uL — ABNORMAL LOW (ref 3.87–5.11)
RDW: 13.6 % (ref 11.5–15.5)
WBC: 8.8 10*3/uL (ref 4.0–10.5)

## 2015-04-16 MED ORDER — TICAGRELOR 90 MG PO TABS: 90.0000 mg | ORAL_TABLET | Freq: Two times a day (BID) | ORAL | Status: DC

## 2015-04-16 MED ORDER — ASPIRIN 81 MG PO CHEW: 81.0000 mg | CHEWABLE_TABLET | Freq: Every day | ORAL | Status: DC

## 2015-04-16 MED ORDER — LISINOPRIL-HYDROCHLOROTHIAZIDE 20-25 MG PO TABS: 1.0000 | ORAL_TABLET | Freq: Every day | ORAL | Status: DC

## 2015-04-16 MED ORDER — ATORVASTATIN CALCIUM 80 MG PO TABS: 80.0000 mg | ORAL_TABLET | Freq: Every day | ORAL | Status: DC

## 2015-04-16 MED ORDER — NITROGLYCERIN 0.4 MG SL SUBL: 0.4000 mg | SUBLINGUAL_TABLET | SUBLINGUAL | Status: DC | PRN

## 2015-04-16 MED ORDER — LISINOPRIL 10 MG PO TABS
20.0000 mg | ORAL_TABLET | Freq: Every day | ORAL | Status: DC
  Administered 2015-04-16: 11:00:00 20 mg via ORAL
  Filled 2015-04-16: qty 2

## 2015-04-16 MED ORDER — HYDROCHLOROTHIAZIDE 25 MG PO TABS
25.0000 mg | ORAL_TABLET | Freq: Every day | ORAL | Status: DC
  Administered 2015-04-16: 11:00:00 25 mg via ORAL
  Filled 2015-04-16: qty 1

## 2015-04-16 NOTE — Progress Notes (Signed)
VASCULAR LAB PRELIMINARY  PRELIMINARY  PRELIMINARY  PRELIMINARY  Carotid duplex completed.    Preliminary report:  40-59% right ICA stenosis.  1-39% left ICA stenosis, higher end of range.  Vertebral artery flow is antegrade.   Brittish Bolinger, RVT 04/16/2015, 9:45 AM

## 2015-04-16 NOTE — Discharge Summary (Signed)
Physician Discharge Summary  Patient ID: Amber Rogers MRN: [id number] DOB/AGE: 08-16-1940 74 y.o.   Primary Cardiologist: Dr. Debara Pickett  Admit date: 04/13/2015 Discharge date: 04/16/2015  Admission Diagnoses: NSTEMI   Discharge Diagnoses:  Principal Problem:   NSTEMI (non-ST elevated myocardial infarction) Active Problems:   Hypertension   Tobacco abuse   CKD (chronic kidney disease), stage III   Sinus bradycardia   NSVT (nonsustained ventricular tachycardia)   NSTEMI (non-ST elevation myocardial infarction)   Discharged Condition: stable  Hospital Course: 74 year old female with a history of hypertension but no prior cardiac history who presented to [facility] on 04/13/15 with complaints of chest pain. She ruled in for NSTEMI with a peak troponin of 5.03. BMP also revealed acute renal insufficiency on arrival, with SCr at 1.47. Subsequently, both her HCTZ and lisinopril were held in anticipation for [facility].   She underwent a LHC by Dr. Gwenlyn Found 04/14/15 which revealed high grade distal RCA lesion (culprit), moderate to severe mRCA lesion and mild to moderate CAD in LAD and LCX. She underwent PCI + DES to both the mid and distal RCA. Medical therapy was elected for her residual CAD. She tolerated the procedure well and left the cath lab in stable condition. Her CP resolved and she was started on DAPT with ASA + Brilinta, along with a statin. She could not tolerated BB therapy due to bradycardia. A 2D echo was obtained which showed normal LVF with an EF of 50-55%.  Durring post recovery, the patient noted change in baseline status/ confusion and headache. This was in the setting of hypertension with SBP in the low 200s. Rapid response was called to beside and Code stroke was activated. Neurology was consulted. A STAT CT of the head was obtained but was negative. A MRI was recommended, however, due to claustrophobia, the patient was unable to complete the study. Neurology recommended a f/u CT the next day. This  was also negative for infarct. Neurology felt that her encephalopathy was likely secondary to her HTN. They recommended BP control and continuation of DAPT. The patients symptoms resolved spontaneously. She was continued on amlodipine for BP. Given improvement in her renal function, post cath, her home doses of lisinopril and HCTZ were resumed. Bilateral carotid dopplers were also preformed prior to discharge (results pending). She was seen by neurology prior to discharge and she was felt to be stable from a neurologic standpoint. She was also seen and examined by Dr. Oval Linsey who determined she was stable for discharge home. She will f/u with Dr. Debara Pickett.   Consults: neurology  Significant Diagnostic Studies:  LHC 04/14/15 Coronary Findings    Dominance: Right   Left Main  The vessel is angiographically normal.     Left Anterior Descending   . Mid LAD lesion, 40% stenosed. calcified .   Marland Kitchen [employer], production   . 2nd Diag lesion, 40% stenosed.     Left Circumflex  The vessel is moderate in size .   Marland Kitchen Dist Cx lesion, 20% stenosed.     Right Coronary Artery   . Mid RCA lesion, 60% stenosed.   . First Right Posterolateral   . 1st RPLB lesion, 95% stenosed.       CT of Head 04/16/15 FINDINGS: Asymmetric white matter hypoattenuation within the anterior limb of the left internal capsule is stable, likely chronic. Diffuse white matter changes are otherwise unchanged. The basal ganglia are intact. The insular ribbon is intact bilaterally. No acute cortical infarcts are present.  A small  polyp or mucous retention cyst is noted posteriorly in the left maxillary sinus. There is some wall thickening in the maxillary sinuses bilaterally. No other significant mucosal disease or fluid levels are present. Mastoid air cells are clear.  Globes and orbits are intact. No significant extracranial soft tissue lesion is present. A focal exostosis of the right frontal calvarium is stable and  appears benign.  IMPRESSION: 1. Stable diffuse white matter disease. 2. No evidence for acute or evolving infarct.   Treatments:  PCI: mid and distal RCA with DES   Discharge Exam: Blood pressure 162/59, pulse 69, temperature 98 F (36.7 C), temperature source Oral, resp. rate 19, height 5\' 4"  (1.626 m), weight 152 lb 1.9 oz (69 kg), SpO2 98 %.   Disposition: 01-Home or Self Care      Discharge Instructions    Amb Referral to Cardiac Rehabilitation    Complete by:  As directed   Congestive Heart Failure: If diagnosis is Heart Failure, patient MUST meet each of the CMS criteria: 1. Left Ventricular Ejection Fraction </= 35% 2. NYHA class II-IV symptoms despite being on optimal heart failure therapy for at least 6 weeks. 3. Stable = have not had a recent (<6 weeks) or planned (<6 months) major cardiovascular hospitalization or procedure  Program Details: - Physician supervised classes - 1-3 classes per week over a 12-18 week period, generally for a total of 36 sessions  Physician Certification: I certify that the above Cardiac Rehabilitation treatment is medically necessary and is medically approved by me for treatment of this patient. The patient is willing and cooperative, able to ambulate and medically stable to participate in exercise rehabilitation. The participant's progress and Individualized Treatment Plan will be reviewed by the Medical Director, Cardiac Rehab staff and as indicated by the Referring/Ordering Physician.  Diagnosis:   Myocardial Infarction PCI       Diet - low sodium heart healthy    Complete by:  As directed      Increase activity slowly    Complete by:  As directed             Medication List    TAKE these medications        amLODipine 10 MG tablet  Commonly known as:  NORVASC  Take 10 mg by mouth daily after supper.     aspirin 81 MG chewable tablet  Chew 1 tablet (81 mg total) by mouth daily.     atorvastatin 80 MG tablet  Commonly  known as:  LIPITOR  Take 1 tablet (80 mg total) by mouth daily at 6 PM.     Fluticasone-Salmeterol 250-50 MCG/DOSE Aepb  Commonly known as:  ADVAIR  Inhale 1 puff into the lungs every 12 (twelve) hours.     lisinopril-hydrochlorothiazide 20-25 MG per tablet  Commonly known as:  PRINZIDE,ZESTORETIC  Take 1 tablet by mouth daily after supper.     nitroGLYCERIN 0.4 MG SL tablet  Commonly known as:  NITROSTAT  Place 1 tablet (0.4 mg total) under the tongue every 5 (five) minutes x 3 doses as needed for chest pain.     predniSONE 5 MG tablet  Commonly known as:  DELTASONE  Take 5 mg by mouth daily after supper.     ticagrelor 90 MG Tabs tablet  Commonly known as:  BRILINTA  Take 1 tablet (90 mg total) by mouth 2 (two) times daily.     ticagrelor 90 MG Tabs tablet  Commonly known as:  BRILINTA  Take 1  tablet (90 mg total) by mouth 2 (two) times daily.       Follow-up Information    Follow up with Pixie Casino, MD.   Specialty:  Cardiology   Why:  our office will call you with a follow-up appointment   Contact information:   [address] [place] [id number] [phone]       Follow up with Xu,Jindong, MD.   Specialty:  Neurology   Why:  As needed    Contact information:   [address] [place] [address] [place]: >30 MINT[address]  Signed: Lyda Jester 04/16/2015, 11:23 AM   History and all data above reviewed.  Patient examined.  I agree with the findings as above.  The patient exam reveals COR:RRR  ,  Lungs: CTAB  ,  Abd: soft, NT, ND, Ext wwp.  No edema.  R wrist without ecchymosis.  2+ radial pulse .  All available labs, radiology testing, previous records reviewed. Agree with documented assessment and plan  .Skeet Latch P  12:42 PM  04/16/2015

## 2015-04-16 NOTE — Progress Notes (Signed)
STROKE TEAM PROGRESS NOTE   SUBJECTIVE (INTERVAL HISTORY) No family members present. The patient feels back to baseline today. The results of the CT scans were discussed with the patient. I also spoke with her daughter by telephone. Further follow-up with Dr. Erlinda Hong will be on an as-needed basis.   OBJECTIVE Temp:  [97.4 F (36.3 C)-98.8 F (37.1 C)] 98 F (36.7 C) (09/10 0801) Pulse Rate:  [59-69] 69 (09/10 0801) Cardiac Rhythm:  [-] Sinus bradycardia;Normal sinus rhythm (09/10 0820) Resp:  [16-20] 19 (09/10 0801) BP: (119-210)/(35-99) 162/59 mmHg (09/10 0801) SpO2:  [96 %-99 %] 98 % (09/10 0801) Weight:  [69 kg (152 lb 1.9 oz)] 69 kg (152 lb 1.9 oz) (09/10 0318)  CBC:  Recent Labs Lab 04/15/15 0322 04/16/15 0308  WBC 9.3 8.8  HGB 10.9* 10.2*  HCT 33.3* 31.6*  MCV 92.5 92.4  PLT 262 542    Basic Metabolic Panel:  Recent Labs Lab 04/14/15 0918 04/15/15 0322 04/16/15 0308  NA  --  139 137  K  --  4.6 4.1  CL  --  106 108  CO2  --  26 22  GLUCOSE  --  108* 109*  BUN  --  22* 16  CREATININE  --  1.32* 1.25*  CALCIUM  --  8.5* 8.0*  MG 1.9  --   --     Lipid Panel:    Component Value Date/Time   CHOL 244* 04/14/2015 0500   TRIG 53 04/14/2015 0500   HDL 104 04/14/2015 0500   CHOLHDL 2.3 04/14/2015 0500   VLDL 11 04/14/2015 0500   LDLCALC 129* 04/14/2015 0500   HgbA1c:  Lab Results  Component Value Date   HGBA1C 6.0* 04/13/2015   Urine Drug Screen: No results found for: LABOPIA, COCAINSCRNUR, LABBENZ, AMPHETMU, THCU, LABBARB    IMAGING  Ct Head Wo Contrast 04/16/2015    1. Stable diffuse white matter disease.  2. No evidence for acute or evolving infarct.      Ct Head Wo Contrast 04/15/2015    Negative for acute hemorrhage.  Scattered white matter disease likely represent chronic small vessel ischemic changes. Area of low density involving the left internal capsule suggests an insult of unknown age.  These results were called by telephone at the time of  interpretation on 04/15/2015 at 12:27 pm to Dr. Erlinda Hong, who verbally acknowledged these results.   Electronically Signed   By: Markus Daft M.D.   On: 04/15/2015 12:30    PHYSICAL EXAM   Mental Status: Alert, oriented, thought content appropriate.  Speech fluent without evidence of aphasia.  Able to follow 3 step commands without difficulty. Cranial Nerves: II: Discs not visualized; Visual fields grossly normal, pupils equal, round, reactive to light and accommodation III,IV, VI: ptosis not present, extra-ocular motions intact bilaterally V,VII: smile symmetric, facial light touch sensation normal bilaterally VIII: hearing normal bilaterally IX,X: gag reflex present XI: bilateral shoulder shrug XII: midline tongue extension Motor: Right : Upper extremity   5/5    Left:     Upper extremity   5/5  Lower extremity   5/5     Lower extremity   5/5 Tone and bulk:normal tone throughout; no atrophy noted Sensory: light touch intact throughout, bilaterally Cerebellar: Mild ataxia with finger to nose using the left upper extremity. Normal with right upper extremity Gait: Deferred    ASSESSMENT/PLAN Ms. Amber Rogers is a 74 y.o. female with history of hypertension, asthma, and recent NSTEMI with subsequent cardiac stenting  presenting with speech difficulties, headache, and mild confusion. She did not receive IV t-PA due to minimal deficits.  Possible TIA versus hypertensive encephalopathy  Resultant  resolution of deficits  MRI  not performed secondary to claustrophobia  MRA  not performed secondary to claustrophobia  Carotid Doppler  40-59% right ICA stenosis. 1-39% left ICA stenosis, higher end of range. Vertebral artery flow is antegrade.  2D Echo  EF 50-55%. No cardiac source of emboli identified  LDL 129  HgbA1c 6.0  VTE prophylaxis - none  Diet Heart Room service appropriate?: Yes; Fluid consistency:: Thin  no antithrombotic prior to admission, now on aspirin 81 mg orally  every day and Brilinta  Patient counseled to be compliant with her antithrombotic medications  Ongoing aggressive stroke risk factor management  Therapy recommendations:  Not applicable  Disposition:  Discharge home today  Hypertension  Blood pressure mildly high. Okay from neuro standpoint to resume normal blood pressure control.  Hyperlipidemia  Home meds:  No lipid lowering medications prior to admission  LDL 129, goal < 70  Now on Lipitor 80 mg daily  Continue statin at discharge    Other Stroke Risk Factors  Advanced age  Cigarette smoker,advised to stop smoking  Coronary artery disease   Other Active Problems  Recent non ST elevation MI and cardiac stents   PLAN  Okay for discharge from neuro standpoint  Follow-up Dr. Erlinda Hong as needed  Resume normal blood pressure controls  Smoking cessation recommended  Hospital day # [place] PA-C [facility] Pager [phone] 04/16/2015, 9:54 AM     To contact Stroke Continuity provider, please refer to [email or web address] After hours, contact General Neurology

## 2015-04-16 NOTE — Progress Notes (Signed)
Patient Profile: 34F with HTN, asthma, arthritis, ongoing tobacco abuse and no prior cardiac history admitted with chest pain and NSTEMI. She underwent LHC + PCI. Post cath recovery complicated by HTN and confusion . Neuro w/u negative for stroke.  Subjective: Doing well this am. Confusion resolved. No further CP.   Objective: Vital signs in last 24 hours: Temp:  [97.4 F (36.3 C)-98.8 F (37.1 C)] 98 F (36.7 C) (09/10 0801) Pulse Rate:  [59-69] 69 (09/10 0801) Resp:  [16-20] 19 (09/10 0801) BP: (119-210)/(35-99) 162/59 mmHg (09/10 0801) SpO2:  [96 %-99 %] 98 % (09/10 0801) Weight:  [152 lb 1.9 oz (69 kg)] 152 lb 1.9 oz (69 kg) (09/10 0318) Last BM Date: 04/12/15  Intake/Output from previous day: 09/09 0701 - 09/10 0700 In: 2420 [P.O.:720; I.V.:1700] Out: 1400 [Urine:1400] Intake/Output this shift: Total I/O In: 240 [P.O.:240] Out: -   Medications Current Facility-Administered Medications  Medication Dose Route Frequency Provider Last Rate Last Dose  . 0.9 %  sodium chloride infusion   Intravenous Continuous Rosalin Hawking, MD 100 mL/hr at 04/15/15 1300    . acetaminophen (TYLENOL) tablet 650 mg  650 mg Oral Q4H PRN Lorretta Harp, MD   650 mg at 04/15/15 1051  . amLODipine (NORVASC) tablet 10 mg  10 mg Oral QPC supper Brett Canales, PA-C   10 mg at 04/15/15 1820  . aspirin chewable tablet 81 mg  81 mg Oral Daily Lorretta Harp, MD   81 mg at 04/16/15 0910  . atorvastatin (LIPITOR) tablet 80 mg  80 mg Oral q1800 Brett Canales, PA-C   80 mg at 04/15/15 1820  . Influenza vac split quadrivalent PF (FLUARIX) injection 0.5 mL  0.5 mL Intramuscular Tomorrow-1000 Herminio Commons, MD      . mometasone-formoterol [facility]) 100-5 MCG/ACT inhaler 2 puff  2 puff Inhalation BID Brett Canales, PA-C   2 puff at 04/15/15 1942  . morphine 2 MG/ML injection 2 mg  2 mg Intravenous Q1H PRN Lorretta Harp, MD   2 mg at 04/15/15 3474  . nitroGLYCERIN (NITROSTAT) SL tablet 0.4 mg  0.4 mg  Sublingual Q5 Min x 3 PRN Brett Canales, PA-C      . ondansetron [facility]) injection 4 mg  4 mg Intravenous Q6H PRN Lorretta Harp, MD   4 mg at 04/15/15 1315  . predniSONE (DELTASONE) tablet 5 mg  5 mg Oral QPC supper Brett Canales, PA-C   5 mg at 04/15/15 1820  . ticagrelor (BRILINTA) tablet 90 mg  90 mg Oral BID Lorretta Harp, MD   90 mg at 04/16/15 0910  . traMADol (ULTRAM) tablet 50 mg  50 mg Oral Q12H PRN Dayna N Dunn, PA-C   50 mg at 04/15/15 1528    PE: General appearance: alert, cooperative and no distress Neck: no carotid bruit and no JVD Lungs: clear to auscultation bilaterally Heart: regular rate and rhythm, S1, S2 normal, no murmur, click, rub or gallop Extremities: no LEE Pulses: 2+ and symmetric Skin: warm and dry Neurologic: Grossly normal  Lab Results:   Recent Labs  04/14/15 0500 04/15/15 0322 04/16/15 0308  WBC 9.3 9.3 8.8  HGB 12.5 10.9* 10.2*  HCT 38.7 33.3* 31.6*  PLT 323 262 244   BMET  Recent Labs  04/14/15 0500 04/15/15 0322 04/16/15 0308  NA 137 139 137  K 4.3 4.6 4.1  CL 103 106 108  CO2 27 26 22   GLUCOSE 110*  108* 109*  BUN 24* 22* 16  CREATININE 1.47* 1.32* 1.25*  CALCIUM 9.0 8.5* 8.0*   PT/INR  Recent Labs  04/14/15 0500  LABPROT 14.0  INR 1.06   Cholesterol  Recent Labs  04/14/15 0500  CHOL 244*    Studies/Results: CT of Head 04/15/09 TECHNIQUE: Contiguous axial images were obtained from the base of the skull through the vertex without intravenous contrast.  COMPARISON: CT head without contrast 04/15/2015.  FINDINGS: Asymmetric white matter hypoattenuation within the anterior limb of the left internal capsule is stable, likely chronic. Diffuse white matter changes are otherwise unchanged. The basal ganglia are intact. The insular ribbon is intact bilaterally. No acute cortical infarcts are present.  A small polyp or mucous retention cyst is noted posteriorly in the left maxillary sinus. There is some wall  thickening in the maxillary sinuses bilaterally. No other significant mucosal disease or fluid levels are present. Mastoid air cells are clear.  Globes and orbits are intact. No significant extracranial soft tissue lesion is present. A focal exostosis of the right frontal calvarium is stable and appears benign.  IMPRESSION: 1. Stable diffuse white matter disease. 2. No evidence for acute or evolving infarct.    Assessment/Plan    Principal Problem:   NSTEMI (non-ST elevated myocardial infarction) Active Problems:   Hypertension   Tobacco abuse   CKD (chronic kidney disease), stage III   Sinus bradycardia   NSVT (nonsustained ventricular tachycardia)   NSTEMI (non-ST elevation myocardial infarction)  1. NSTEMI/CAD - s/p DES to [facility], s/p DES to dRCA. Continue aspirin and statin. No BB given sinus bradycardia (HR 40s-50s without BB). EF normal at 50-55% on echo.   2. Tobacco abuse - cessation advised.  3. HTN - moderately elevated. Unable to add or titrate AVN blocking agents due to sinus bradycardia. her HCTZ and lisinopril were initially held for cath. We will resume both meds now. Continue amlodipine.   4. Renal insufficiency, question CKD stage III - Cr stable post-cath. Will restart ACEI/HCTZ on hold.   5. Sinus bradycardia BB discontinued  due to HR in the 40s. HR is stable off of AVN blocking agents.  6. NSVT on 9/8 - likely 2/2 #1. Now quiescent.  7. Anemia - may be procedurally related. No evidence of bleeding. Hgb stable ~ 10.  We can f/u as outpatient.  6. Confusion: symptoms resolved and now back to baseline. Negative stroke w/u. F/u CT of head this am was negative. Carotid dopplers were just performed. Will f/u results and notify patient via phone. Appreciate neuro's assistance.   Dispo: patient was seen by Dr. Oval Linsey and has been cleared for discharge home. Will arrange f/u in 1-2 weeks.    LOS: 3 days    Daizy Outen M. Rosita Fire, PA-C 04/16/2015 9:54  AM

## 2015-04-17 DIAGNOSIS — R519 Headache, unspecified: Secondary | ICD-10-CM | POA: Insufficient documentation

## 2015-04-17 DIAGNOSIS — R51 Headache: Secondary | ICD-10-CM

## 2015-04-21 ENCOUNTER — Telehealth: Payer: Self-pay | Admitting: *Deleted

## 2015-04-21 NOTE — Telephone Encounter (Signed)
Notes Recorded by Laurine Blazer, LPN on 6/62/9476 at 5:46 PM Patient notified. Advised to keep her already scheduled appointment with Tarri Fuller, PA at the [facility] office in [place] for 05/03/2015 at 10:30.

## 2015-04-21 NOTE — Telephone Encounter (Signed)
-----   Message from Arnoldo Lenis, MD sent at 04/21/2015  9:52 AM EDT ----- Carotid US shows only mild to moderate blockages on both sides, will continue to monitor  Zandra Abts MD

## 2015-05-03 ENCOUNTER — Encounter: Payer: Self-pay | Admitting: Physician Assistant

## 2015-05-03 ENCOUNTER — Ambulatory Visit (INDEPENDENT_AMBULATORY_CARE_PROVIDER_SITE_OTHER): Payer: Medicare Other | Admitting: Physician Assistant

## 2015-05-03 VITALS — BP 170/76 | HR 86 | Ht 65.0 in | Wt 142.2 lb

## 2015-05-03 DIAGNOSIS — Z720 Tobacco use: Secondary | ICD-10-CM | POA: Diagnosis not present

## 2015-05-03 DIAGNOSIS — I10 Essential (primary) hypertension: Secondary | ICD-10-CM | POA: Diagnosis not present

## 2015-05-03 DIAGNOSIS — N183 Chronic kidney disease, stage 3 unspecified: Secondary | ICD-10-CM

## 2015-05-03 DIAGNOSIS — I2583 Coronary atherosclerosis due to lipid rich plaque: Secondary | ICD-10-CM

## 2015-05-03 DIAGNOSIS — I2510 Atherosclerotic heart disease of native coronary artery without angina pectoris: Secondary | ICD-10-CM

## 2015-05-03 NOTE — Patient Instructions (Signed)
Tarri Fuller, PA-C, recommends that you schedule a follow-up appointment in 3 months with Dr Debara Pickett.

## 2015-05-03 NOTE — Progress Notes (Signed)
Patient ID: Amber Rogers, female   DOB: 1941/07/12, 74 y.o.   MRN: [id number]    Date:  05/03/2015   ID:  Amber Rogers, DOB July 17, 1941, MRN [id number]  PCP:  Simona Huh, MD  Primary Cardiologist:  Gwenlyn Found   Chief Complaint  Patient presents with  . Hospitalization Follow-up    patient reports no complaints     History of Present Illness: Amber Rogers is a 74 y.o. female  74 year old female with a history of hypertension but no prior cardiac history who presented to [facility] on [facility]15 with complaints of chest pain. She ruled in for NSTEMI with a peak troponin of 5.03. BMP also revealed acute renal insufficiency on arrival, with SCr at 1.47. Subsequently, both her HCTZ and lisinopril were held in anticipation for [facility].   She underwent a LHC by Dr. Gwenlyn Found 04/14/15 which revealed high grade distal RCA lesion (culprit), moderate to severe mRCA lesion and mild to moderate CAD in LAD and LCX. She underwent PCI + DES to both the mid and distal RCA. Medical therapy was elected for her residual CAD. She tolerated the procedure well and left the cath lab in stable condition. Her CP resolved and she was started on DAPT with ASA + Brilinta, along with a statin. She could not tolerated BB therapy due to bradycardia. A 2D echo was obtained which showed normal LVF with an EF of 50-55%.  Durring post recovery, the patient noted change in baseline status/ confusion and headache. This was in the setting of hypertension with SBP in the low 200s. Rapid response was called to beside and Code stroke was activated. Neurology was consulted. A STAT CT of the head was obtained but was negative. A MRI was recommended, however, due to claustrophobia, the patient was unable to complete the study. Neurology recommended a f/u CT the next day. This was also negative for infarct. Neurology felt that her encephalopathy was likely secondary to her HTN. They recommended BP control and continuation of DAPT.  The patients symptoms resolved  spontaneously. She was continued on amlodipine for BP. Given improvement in her renal function, post cath, her home doses of lisinopril and HCTZ were resumed. Bilateral carotid dopplers were also preformed prior to discharge. She was seen by neurology prior to discharge and she was felt to be stable from a neurologic standpoint.   Patient presents for posthospital evaluation. She reports doing well. Her neurological symptoms of completely resolved. She's had no chest pain. She's taken her Brilinta as prescribed. She also denies nausea, vomiting, fever, chest pain, shortness of breath, orthopnea, dizziness, PND, cough, congestion, abdominal pain, hematochezia, melena, lower extremity edema, claudication.  She is no longer smoking.  She periodically has some heaviness in her legs when she walks however this is not all the time.  Wt Readings from Last 3 Encounters:  05/03/15 142 lb 3.2 oz (64.501 kg)  04/16/15 152 lb 1.9 oz (69 kg)  02/10/13 148 lb (67.132 kg)     Past Medical History  Diagnosis Date  . Hypertension   . Arthritis   . Asthma   . Pneumonia 1996    Current Outpatient Prescriptions  Medication Sig Dispense Refill  . amLODipine (NORVASC) 10 MG tablet Take 10 mg by mouth daily after supper.    Marland Kitchen aspirin 81 MG chewable tablet Chew 1 tablet (81 mg total) by mouth daily.    Marland Kitchen atorvastatin (LIPITOR) 80 MG tablet Take 1 tablet (80 mg total) by mouth daily at 6 PM.  30 tablet 5  . Fluticasone-Salmeterol (ADVAIR) 250-50 MCG/DOSE AEPB Inhale 1 puff into the lungs every 12 (twelve) hours.     Marland Kitchen lisinopril-hydrochlorothiazide (PRINZIDE,ZESTORETIC) 20-25 MG per tablet Take 1 tablet by mouth daily after supper.    . nitroGLYCERIN (NITROSTAT) 0.4 MG SL tablet Place 1 tablet (0.4 mg total) under the tongue every 5 (five) minutes x 3 doses as needed for chest pain. 25 tablet 5  . predniSONE (DELTASONE) 5 MG tablet Take 5 mg by mouth daily after supper.    . ticagrelor (BRILINTA) 90 MG TABS tablet  Take 1 tablet (90 mg total) by mouth 2 (two) times daily. 60 tablet 10   No current facility-administered medications for this visit.    Allergies:   No Known Allergies  Social History:  The patient  reports that she quit smoking about 2 weeks ago. Her smoking use included Cigarettes. She quit after 25 years of use. She does not have any smokeless tobacco history on file. She reports that she does not drink alcohol or use illicit drugs.   Family history:  No family history on file.  ROS:  Please see the history of present illness.  All other systems reviewed and negative.   PHYSICAL EXAM: VS:  BP 170/76 mmHg  Pulse 86  Ht 5\' 5"  (1.651 m)  Wt 142 lb 3.2 oz (64.501 kg)  BMI 23.66 kg/m2 Well nourished, well developed, in no acute distress HEENT: Pupils are equal round react to light accommodation extraocular movements are intact.  Neck: no JVDNo cervical lymphadenopathy. Cardiac: Regular rate and rhythm without murmurs rubs or gallops. Lungs:  clear to auscultation bilaterally, no wheezing, rhonchi or rales Abd: soft, nontender, positive bowel sounds all quadrants, no hepatosplenomegaly Ext: no lower extremity edema.  2+ radial and dorsalis pedis pulses. Skin: warm and dry Neuro:  Grossly normal  Lipid Panel     Component Value Date/Time   CHOL 244* 04/14/2015 0500   TRIG 53 04/14/2015 0500   HDL 104 04/14/2015 0500   CHOLHDL 2.3 04/14/2015 0500   VLDL 11 04/14/2015 0500   LDLCALC 129* 04/14/2015 0500    EKG: Normal sinus rhythm rate 86 bpm.  ASSESSMENT AND PLAN:  Problem List Items Addressed This Visit    Tobacco abuse   Hypertension - Primary   Coronary artery disease due to lipid rich plaque   CKD (chronic kidney disease), stage III     Coronary artery disease: Status post drug-eluting stents to the mid and distal RCA. Continue aspirin, statin and Brilinta.  Interestingly the patient is a very good lipid panel with triglycerides of 53 and HDL of 104.       Essential hypertension Patient reports her blood pressure is always elevated when she comes to the doctor's office. At home before she left it was 130/80. No changes in current medications.  Tobacco abuse: Patient has not smoked since her hospitalization. Encouraged her to continue this.

## 2015-05-06 ENCOUNTER — Telehealth: Payer: Self-pay | Admitting: Internal Medicine

## 2015-05-06 NOTE — Telephone Encounter (Signed)
Faxed [place] form to [phone]

## 2015-05-06 NOTE — Telephone Encounter (Signed)
Levada Dy (family) is going to fax [place] over AZ&[place] paperwork - form needs to have Rx info on it and this was not included w/ initial faxed paperwork. [employer] needs this to process application.  Provided her fax number here. Routing to [place].

## 2015-05-06 NOTE — Telephone Encounter (Signed)
Please call,concerning a prescription that was written wrong at hospital. This prescription was so she could get help with her Brilinta.

## 2015-05-10 ENCOUNTER — Telehealth: Payer: Self-pay | Admitting: Internal Medicine

## 2015-05-10 NOTE — Telephone Encounter (Signed)
Patient calling the office for samples of medication:   1.  What medication and dosage are you requesting samples for?Brilinta  2.  Are you currently out of this medication? 5 days left-she takes 2 a day   3. Are you requesting samples to get you through until a mail order prescription arrives?yes-wont be here until around 05-23-15

## 2015-05-10 NOTE — Telephone Encounter (Signed)
Medication samples have been provided to the patient. Drug name: Brilinta 90 mg Qty: 48 tablets LOT: [id number] Exp.Date: 08/2017 Samples left at front desk for patient pick-up. Patient notified.  Mikella Linsley, [place] 1:43 PM 05/10/2015

## 2015-06-18 ENCOUNTER — Emergency Department (HOSPITAL_COMMUNITY): Payer: Medicare Other

## 2015-06-18 ENCOUNTER — Encounter (HOSPITAL_COMMUNITY): Payer: Self-pay | Admitting: *Deleted

## 2015-06-18 ENCOUNTER — Emergency Department (HOSPITAL_COMMUNITY)
Admission: EM | Admit: 2015-06-18 | Discharge: 2015-06-18 | Disposition: A | Discharge status: Home / Self Care | Payer: Medicare Other | Attending: Emergency Medicine | Admitting: Emergency Medicine

## 2015-06-18 DIAGNOSIS — Y998 Other external cause status: Secondary | ICD-10-CM | POA: Insufficient documentation

## 2015-06-18 DIAGNOSIS — M1990 Unspecified osteoarthritis, unspecified site: Secondary | ICD-10-CM | POA: Insufficient documentation

## 2015-06-18 DIAGNOSIS — S7002XA Contusion of left hip, initial encounter: Secondary | ICD-10-CM | POA: Diagnosis not present

## 2015-06-18 DIAGNOSIS — Z7902 Long term (current) use of antithrombotics/antiplatelets: Secondary | ICD-10-CM | POA: Diagnosis not present

## 2015-06-18 DIAGNOSIS — S20212A Contusion of left front wall of thorax, initial encounter: Secondary | ICD-10-CM | POA: Insufficient documentation

## 2015-06-18 DIAGNOSIS — Y9389 Activity, other specified: Secondary | ICD-10-CM | POA: Diagnosis not present

## 2015-06-18 DIAGNOSIS — I2510 Atherosclerotic heart disease of native coronary artery without angina pectoris: Secondary | ICD-10-CM | POA: Insufficient documentation

## 2015-06-18 DIAGNOSIS — J45909 Unspecified asthma, uncomplicated: Secondary | ICD-10-CM | POA: Diagnosis not present

## 2015-06-18 DIAGNOSIS — Z87891 Personal history of nicotine dependence: Secondary | ICD-10-CM | POA: Insufficient documentation

## 2015-06-18 DIAGNOSIS — Z79899 Other long term (current) drug therapy: Secondary | ICD-10-CM | POA: Diagnosis not present

## 2015-06-18 DIAGNOSIS — Z8701 Personal history of pneumonia (recurrent): Secondary | ICD-10-CM | POA: Insufficient documentation

## 2015-06-18 DIAGNOSIS — Z9889 Other specified postprocedural states: Secondary | ICD-10-CM | POA: Insufficient documentation

## 2015-06-18 DIAGNOSIS — W19XXXA Unspecified fall, initial encounter: Secondary | ICD-10-CM

## 2015-06-18 DIAGNOSIS — R0789 Other chest pain: Secondary | ICD-10-CM | POA: Diagnosis not present

## 2015-06-18 DIAGNOSIS — Z7951 Long term (current) use of inhaled steroids: Secondary | ICD-10-CM | POA: Insufficient documentation

## 2015-06-18 DIAGNOSIS — M25552 Pain in left hip: Secondary | ICD-10-CM | POA: Diagnosis not present

## 2015-06-18 DIAGNOSIS — Z7952 Long term (current) use of systemic steroids: Secondary | ICD-10-CM | POA: Insufficient documentation

## 2015-06-18 DIAGNOSIS — Z7982 Long term (current) use of aspirin: Secondary | ICD-10-CM | POA: Insufficient documentation

## 2015-06-18 DIAGNOSIS — I10 Essential (primary) hypertension: Secondary | ICD-10-CM | POA: Diagnosis not present

## 2015-06-18 DIAGNOSIS — S79912A Unspecified injury of left hip, initial encounter: Secondary | ICD-10-CM | POA: Insufficient documentation

## 2015-06-18 DIAGNOSIS — Y9289 Other specified places as the place of occurrence of the external cause: Secondary | ICD-10-CM | POA: Insufficient documentation

## 2015-06-18 DIAGNOSIS — W010XXA Fall on same level from slipping, tripping and stumbling without subsequent striking against object, initial encounter: Secondary | ICD-10-CM | POA: Diagnosis not present

## 2015-06-18 DIAGNOSIS — S2002XA Contusion of left breast, initial encounter: Secondary | ICD-10-CM | POA: Diagnosis not present

## 2015-06-18 DIAGNOSIS — M549 Dorsalgia, unspecified: Secondary | ICD-10-CM | POA: Diagnosis not present

## 2015-06-18 DIAGNOSIS — R0781 Pleurodynia: Secondary | ICD-10-CM | POA: Diagnosis not present

## 2015-06-18 HISTORY — DX: Atherosclerotic heart disease of native coronary artery without angina pectoris: I25.10

## 2015-06-18 MED ORDER — ACETAMINOPHEN 325 MG PO TABS: 650.0000 mg | ORAL_TABLET | Freq: Once | ORAL | Status: DC

## 2015-06-18 NOTE — ED Notes (Signed)
Pt reports falling on Monday, was standing on her couch to place decor, fell onto wood floors, landing on left side. Has bruising to left breast area and left hip. Reports pain to bilateral ribs and back, causing mild sob since fall. No acute distress noted at triage.

## 2015-06-18 NOTE — Discharge Instructions (Signed)
Rest and use heat or ice to the areas of pain. Take Tylenol as needed for pain control. Follow up with your primary care doctor if pain persists.   Chest Wall Pain Chest wall pain is pain in or around the bones and muscles of your chest. Sometimes, an injury causes this pain. Sometimes, the cause may not be known. This pain may take several weeks or longer to get better. HOME CARE INSTRUCTIONS  Pay attention to any changes in your symptoms. Take these actions to help with your pain:   Rest as told by your health care provider.   Avoid activities that cause pain. These include any activities that use your chest muscles or your abdominal and side muscles to lift heavy items.   If directed, apply ice to the painful area:  Put ice in a plastic bag.  Place a towel between your skin and the bag.  Leave the ice on for 20 minutes, 2-3 times per day.  Take over-the-counter and prescription medicines only as told by your health care provider.  Do not use tobacco products, including cigarettes, chewing tobacco, and e-cigarettes. If you need help quitting, ask your health care provider.  Keep all follow-up visits as told by your health care provider. This is important. SEEK MEDICAL CARE IF:  You have a fever.  Your chest pain becomes worse.  You have new symptoms. SEEK IMMEDIATE MEDICAL CARE IF:  You have nausea or vomiting.  You feel sweaty or light-headed.  You have a cough with phlegm (sputum) or you cough up blood.  You develop shortness of breath.   This information is not intended to replace advice given to you by your health care provider. Make sure you discuss any questions you have with your health care provider.   Document Released: 07/23/2005 Document Revised: 04/13/2015 Document Reviewed: 10/18/2014 Elsevier Interactive Patient Education 2016 Elsevier Inc.  Hip Pain Your hip is the joint between your upper legs and your lower pelvis. The bones, cartilage, tendons,  and muscles of your hip joint perform a lot of work each day supporting your body weight and allowing you to move around. Hip pain can range from a minor ache to severe pain in one or both of your hips. Pain may be felt on the inside of the hip joint near the groin, or the outside near the buttocks and upper thigh. You may have swelling or stiffness as well.  HOME CARE INSTRUCTIONS   Take medicines only as directed by your health care provider.  Apply ice to the injured area:  Put ice in a plastic bag.  Place a towel between your skin and the bag.  Leave the ice on for 15-20 minutes at a time, 3-4 times a day.  Keep your leg raised (elevated) when possible to lessen swelling.  Avoid activities that cause pain.  Follow specific exercises as directed by your health care provider.  Sleep with a pillow between your legs on your most comfortable side.  Record how often you have hip pain, the location of the pain, and what it feels like. SEEK MEDICAL CARE IF:   You are unable to put weight on your leg.  Your hip is red or swollen or very tender to touch.  Your pain or swelling continues or worsens after 1 week.  You have increasing difficulty walking.  You have a fever. SEEK IMMEDIATE MEDICAL CARE IF:   You have fallen.  You have a sudden increase in pain and swelling in  your hip. MAKE SURE YOU:   Understand these instructions.  Will watch your condition.  Will get help right away if you are not doing well or get worse.   This information is not intended to replace advice given to you by your health care provider. Make sure you discuss any questions you have with your health care provider.   Document Released: 01/10/2010 Document Revised: 08/13/2014 Document Reviewed: 03/19/2013 Elsevier Interactive Patient Education 2016 Elsevier Inc.  Musculoskeletal Pain Musculoskeletal pain is muscle and boney aches and pains. These pains can occur in any part of the body. Your  caregiver may treat you without knowing the cause of the pain. They may treat you if blood or urine tests, X-rays, and other tests were normal.  CAUSES There is often not a definite cause or reason for these pains. These pains may be caused by a type of germ (virus). The discomfort may also come from overuse. Overuse includes working out too hard when your body is not fit. Boney aches also come from weather changes. Bone is sensitive to atmospheric pressure changes. HOME CARE INSTRUCTIONS   Ask when your test results will be ready. Make sure you get your test results.  Only take over-the-counter or prescription medicines for pain, discomfort, or fever as directed by your caregiver. If you were given medications for your condition, do not drive, operate machinery or power tools, or sign legal documents for 24 hours. Do not drink alcohol. Do not take sleeping pills or other medications that may interfere with treatment.  Continue all activities unless the activities cause more pain. When the pain lessens, slowly resume normal activities. Gradually increase the intensity and duration of the activities or exercise.  During periods of severe pain, bed rest may be helpful. Lay or sit in any position that is comfortable.  Putting ice on the injured area.  Put ice in a bag.  Place a towel between your skin and the bag.  Leave the ice on for 15 to 20 minutes, 3 to 4 times a day.  Follow up with your caregiver for continued problems and no reason can be found for the pain. If the pain becomes worse or does not go away, it may be necessary to repeat tests or do additional testing. Your caregiver may need to look further for a possible cause. SEEK IMMEDIATE MEDICAL CARE IF:  You have pain that is getting worse and is not relieved by medications.  You develop chest pain that is associated with shortness or breath, sweating, feeling sick to your stomach (nauseous), or throw up (vomit).  Your pain  becomes localized to the abdomen.  You develop any new symptoms that seem different or that concern you. MAKE SURE YOU:   Understand these instructions.  Will watch your condition.  Will get help right away if you are not doing well or get worse.   This information is not intended to replace advice given to you by your health care provider. Make sure you discuss any questions you have with your health care provider.   Document Released: 07/23/2005 Document Revised: 10/15/2011 Document Reviewed: 03/27/2013 Elsevier Interactive Patient Education [employer].

## 2015-06-18 NOTE — ED Provider Notes (Signed)
CSN: [id number]     Arrival date & time 06/18/15  1225 History   None    Chief Complaint  Patient presents with  . Fall   HPI  Amber Rogers is a 74 year old female with PMHx of HTN, arthritis and CAD presenting after a fall. Patient reports that she fell 6 days ago while she was attempting to hang Christmas decorations. She states that she was standing on her couch when her foot slipped on a blanket and she fell to the floor on her left side. She denies hitting her head or loss of consciousness. She states that she felt sore but was able to pick herself up off the floor and has been ambulatory since. She states that the pain in her left hip and left chest wall has persisted. She is also complaining of bruising to the left hip and left lateral chest wall. She states that the left-sided hip pain increases with rolling over in bed, sitting up and ambulating. The pain does not radiate into the thigh or back. Resting alleviates the pain. She states that the chest wall pain makes taking deep breaths painful. She states that she did not take any home medications for pain other than 2 tablets of ibuprofen yesterday which helps with her pain. She is concerned she is taking Brilinta and has heard that she cannot take NSAIDs with this medicine. She decided to come in today because it has been almost a week and she is still experiencing soreness and is unsure which pain medicine is safe to take.. She denies new falls or new characteristics of the pain since the fall. She denies headaches, blurred vision, neck pain, back pain, abdominal pain, nausea, vomiting or numbness/tingling/weakness in the extremities.  Past Medical History  Diagnosis Date  . Hypertension   . Arthritis   . Asthma   . Pneumonia 1996  . Coronary artery disease    Past Surgical History  Procedure Laterality Date  . Right knee arthroscopy  2004  . 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 Cardiac catheterization N/A 04/14/2015    Procedure: Left Heart Cath and Coronary Angiography;  Surgeon: Jolaine Artist, MD;  Location: [place] [facility];  Service: Cardiovascular;  Laterality: N/A;  . Cardiac catheterization N/A 04/14/2015    Procedure: Coronary Stent Intervention;  Surgeon: Lorretta Harp, MD;  Location: [place] [facility];  Service: Cardiovascular;  Laterality: N/A;   History reviewed. No pertinent family history. Social History  Substance Use Topics  . Smoking status: Former Smoker -- 25 years    Types: Cigarettes    Quit date: 04/13/2015  . Smokeless tobacco: None     Comment: smokes occassionally-1-2 when nervous  . Alcohol Use: No   OB History    No data available     Review of Systems  Constitutional: Negative for chills and diaphoresis.  Eyes: Negative for pain and visual disturbance.  Respiratory: Negative for cough, shortness of breath and wheezing.   Cardiovascular: Positive for chest pain (chest wall). Negative for palpitations.  Gastrointestinal: Negative for nausea, vomiting and abdominal pain.  Musculoskeletal: Positive for arthralgias. Negative for back pain, joint swelling, gait problem and neck pain.  Skin: Positive for color change (bruising). Negative for wound.  Neurological: Negative for dizziness, syncope, weakness, numbness and headaches.  All other systems reviewed and  are negative.     Allergies  Review of patient's allergies indicates no known allergies.  Home Medications   Prior to Admission medications   Medication Sig Start Date End Date Taking? Authorizing Provider  amLODipine (NORVASC) 10 MG tablet Take 10 mg by mouth daily after supper.   Yes Historical Provider, MD  aspirin 81 MG chewable tablet Chew 1 tablet (81 mg total) by mouth daily. 04/16/15  Yes Brittainy Erie Noe, PA-C  atorvastatin (LIPITOR) 80 MG tablet Take 1 tablet  (80 mg total) by mouth daily at 6 PM. 04/16/15  Yes Brittainy Erie Noe, PA-C  Fluticasone-Salmeterol (ADVAIR) 250-50 MCG/DOSE AEPB Inhale 1 puff into the lungs every 12 (twelve) hours.    Yes Historical Provider, MD  lisinopril-hydrochlorothiazide (PRINZIDE,ZESTORETIC) 20-25 MG per tablet Take 1 tablet by mouth daily after supper.   Yes Historical Provider, MD  nitroGLYCERIN (NITROSTAT) 0.4 MG SL tablet Place 1 tablet (0.4 mg total) under the tongue every 5 (five) minutes x 3 doses as needed for chest pain. 04/16/15  Yes Brittainy Erie Noe, PA-C  predniSONE (DELTASONE) 5 MG tablet Take 5 mg by mouth daily after supper.   Yes Historical Provider, MD  ticagrelor (BRILINTA) 90 MG TABS tablet Take 1 tablet (90 mg total) by mouth 2 (two) times daily. 04/16/15  Yes Brittainy M Simmons, PA-C   BP 130/87 mmHg  Pulse 94  Temp(Src) 98.9 F (37.2 C) (Oral)  Resp 18  SpO2 99% Physical Exam  Constitutional: She is oriented to person, place, and time. She appears well-developed and well-nourished. No distress.  HENT:  Head: Normocephalic and atraumatic.  Mouth/Throat: Oropharynx is clear and moist.  Eyes: Conjunctivae and EOM are normal. Pupils are equal, round, and reactive to light. Right eye exhibits no discharge. Left eye exhibits no discharge. No scleral icterus.  Neck: Normal range of motion. Neck supple.  Cardiovascular: Normal rate and regular rhythm.   Pulmonary/Chest: Effort normal and breath sounds normal. No respiratory distress. She has no wheezes. She has no rales. She exhibits tenderness.    Bruising noted to the left lateral chest wall. Left lateral chest wall pain is reproducible with palpation. Breathing is unlabored. Lungs are clear to auscultation bilaterally and she is moving air in all lung fields.  Abdominal: Soft. She exhibits no distension. There is no tenderness.  Musculoskeletal: Normal range of motion. She exhibits tenderness. She exhibits no edema.       Legs: Bruising noted  to the left iliac crest region. This area is not tender to palpation. Full range of motion of bilateral lower extremities without pain. Patient is able to ambulate easily with a steady gait. Full range of motion of lumbar spine. No tenderness over spinous processes, bony deformities or step-offs. Moves upper extremities spontaneously.  Neurological: She is alert and oriented to person, place, and time. No cranial nerve deficit. Coordination normal.  Cranial nerves III through XII tested and intact. 5 out 5 strength in all major muscle groups. Sensation to light touch intact throughout. Coordinated finger to nose and heel to shin.  Skin: Skin is warm and dry.  Ecchymoses noted over left lateral hip and left lateral chest wall  Psychiatric: She has a normal mood and affect. Her behavior is normal.  Nursing note and vitals reviewed.   ED Course  Procedures (including critical care time) Labs Review Labs Reviewed - No data to display  Imaging Review Dg Ribs Bilateral W/chest  06/18/2015  CLINICAL DATA:  Pt reports falling on Monday, was  standing on her couch to place decor, fell onto wood floors, landing on left side. Has severe bruising to left breast area and left anterior hip. Pain in the ribs causing shortness of breath. EXAM: BILATERAL RIBS AND CHEST - 4+ VIEW COMPARISON:  04/13/2015 FINDINGS: No fracture or other bone lesions are seen involving the ribs. There is no evidence of pneumothorax or pleural effusion. Both lungs are clear. Heart size and mediastinal contours are within normal limits. IMPRESSION: Negative. Electronically Signed   By: Nolon Nations M.D.   On: 06/18/2015 13:52   Dg Hip Unilat With Pelvis 2-3 Views Left  06/18/2015  CLINICAL DATA:  Pt reports falling on Monday, was standing on her couch to place decor, fell onto wood floors, landing on left side. Has severe bruising to left breast area and left anterior hip. Reports pain to bilateral ribs and back. EXAM: DG HIP (WITH  OR WITHOUT PELVIS) 2-3V LEFT COMPARISON:  None. FINDINGS: Status post right hip arthroplasty. There is no acute fracture or dislocation. Pelvic phleboliths are present. Degenerative changes are seen in the lower lumbar spine. IMPRESSION: No evidence for acute  abnormality. Electronically Signed   By: Nolon Nations M.D.   On: 06/18/2015 13:50   I have personally reviewed and evaluated these images and lab results as part of my medical decision-making.   EKG Interpretation None      MDM   Final diagnoses:  Fall, initial encounter  Left hip pain  Chest wall pain   Patient presenting after a fall with persistent left hip and left chest wall pain. Fall was 6 days ago. No head injury with the fall. Left hip pain is exacerbated with movement. Left chest wall pain is exacerbated by deep inspiration. Vital signs stable. Satting at 99% on room air. Reproducible left lateral chest wall pain with ecchymosis. Lungs are clear auscultation laterally. Left leg is neurovascularly intact with FROM at the hip and knee. Patient X-Ray negative for obvious fracture or dislocation. Pain managed in ED with Tylenol. Pt is able to ambulate with a steady gait. Conservative therapy recommended. Patient is concerned about using NSAIDs with her Brilinta, recommended she use Tylenol instead. Discussed RICE therapy, ice and ice. Pt advised to follow up with PCP if symptoms persist. Return precautions discussed at bedside and given in discharge paperwork. Pt is stable for discharge.    Josephina Gip, PA-C 06/18/15 1601  Pattricia Boss, MD 06/19/15 [phone]

## 2015-07-25 ENCOUNTER — Encounter: Payer: Self-pay | Admitting: Internal Medicine

## 2015-07-25 ENCOUNTER — Ambulatory Visit (INDEPENDENT_AMBULATORY_CARE_PROVIDER_SITE_OTHER): Payer: Medicare Other | Admitting: Internal Medicine

## 2015-07-25 VITALS — BP 162/74 | HR 80 | Ht 65.0 in | Wt 137.0 lb

## 2015-07-25 DIAGNOSIS — I2510 Atherosclerotic heart disease of native coronary artery without angina pectoris: Secondary | ICD-10-CM | POA: Diagnosis not present

## 2015-07-25 DIAGNOSIS — N183 Chronic kidney disease, stage 3 unspecified: Secondary | ICD-10-CM

## 2015-07-25 DIAGNOSIS — I2583 Coronary atherosclerosis due to lipid rich plaque: Secondary | ICD-10-CM

## 2015-07-25 DIAGNOSIS — Z720 Tobacco use: Secondary | ICD-10-CM

## 2015-07-25 DIAGNOSIS — I10 Essential (primary) hypertension: Secondary | ICD-10-CM | POA: Diagnosis not present

## 2015-07-25 NOTE — Patient Instructions (Signed)
Your physician wants you to follow-up in:  6 months. You will receive a reminder letter in the mail two months in advance. If you don't receive a letter, please call our office to schedule the follow-up appointment.   

## 2015-07-25 NOTE — Progress Notes (Signed)
Patient ID: Amber Rogers, female   DOB: 1940/12/27, 74 y.o.   MRN: [id number]    Date:  07/25/2015   ID:  Amber Rogers, DOB [birth year], MRN [id number]  PCP:  Simona Huh, MD  Primary Cardiologist:  Gwenlyn Found   Chief Complaint  Patient presents with  . 3 MONTHS    NO COMPLAINTS     History of Present Illness: Amber Rogers is a 74 y.o. female  74 year old female with a history of hypertension but no prior cardiac history who presented to [facility] on 04/13/15 with complaints of chest pain. She ruled in for NSTEMI with a peak troponin of 5.03. BMP also revealed acute renal insufficiency on arrival, with SCr at 1.47. Subsequently, both her HCTZ and lisinopril were held in anticipation for [facility].   She underwent a LHC by Dr. Gwenlyn Found 04/14/15 which revealed high grade distal RCA lesion (culprit), moderate to severe mRCA lesion and mild to moderate CAD in LAD and LCX. She underwent PCI + DES to both the mid and distal RCA. Medical therapy was elected for her residual CAD. She tolerated the procedure well and left the cath lab in stable condition. Her CP resolved and she was started on DAPT with ASA + Brilinta, along with a statin. She could not tolerated BB therapy due to bradycardia. A 2D echo was obtained which showed normal LVF with an EF of 50-55%.  Durring post recovery, the patient noted change in baseline status/ confusion and headache. This was in the setting of hypertension with SBP in the low 200s. Rapid response was called to beside and Code stroke was activated. Neurology was consulted. A STAT CT of the head was obtained but was negative. A MRI was recommended, however, due to claustrophobia, the patient was unable to complete the study. Neurology recommended a f/u CT the next day. This was also negative for infarct. Neurology felt that her encephalopathy was likely secondary to her HTN. They recommended BP control and continuation of DAPT.  The patients symptoms resolved spontaneously. She was continued  on amlodipine for BP. Given improvement in her renal function, post cath, her home doses of lisinopril and HCTZ were resumed. Bilateral carotid dopplers were also preformed prior to discharge. She was seen by neurology prior to discharge and she was felt to be stable from a neurologic standpoint.   Patient presents for posthospital evaluation. She reports doing well. Her neurological symptoms of completely resolved. She's had no chest pain. She's taken her Brilinta as prescribed. She also denies nausea, vomiting, fever, chest pain, shortness of breath, orthopnea, dizziness, PND, cough, congestion, abdominal pain, hematochezia, melena, lower extremity edema, claudication.  She is no longer smoking.  She periodically has some heaviness in her legs when she walks however this is not all the time.  Amber Rogers returns today for follow-up. She denies any further chest pain or worsening shortness of breath. She's had some bruising although she is on chronic steroids and aspirin and Brilinta. She has no worsening shortness of breath. She says in fact she's used her inhaler a lot less now that she is stop smoking. She seems to be compliant with this. Blood pressure is mildly elevated today however recheck was 140/70. She did not take her blood pressure medicine this morning. She does have a follow-up appointment with her primary care provider in January which time she'll get her cholesterol rechecked.  Wt Readings from Last 3 Encounters:  07/25/15 137 lb (62.143 kg)  05/03/15 142 lb 3.2 oz (  64.501 kg)  04/16/15 152 lb 1.9 oz (69 kg)     Past Medical History  Diagnosis Date  . Hypertension   . Arthritis   . Asthma   . Pneumonia 1996  . Coronary artery disease     Current Outpatient Prescriptions  Medication Sig Dispense Refill  . amLODipine (NORVASC) 10 MG tablet Take 10 mg by mouth Rogers after supper.    Marland Kitchen aspirin 81 MG chewable tablet Chew 1 tablet (81 mg total) by mouth Rogers.    Marland Kitchen atorvastatin  (LIPITOR) 80 MG tablet Take 1 tablet (80 mg total) by mouth Rogers at 6 PM. 30 tablet 5  . Fluticasone-Salmeterol (ADVAIR) 250-50 MCG/DOSE AEPB Inhale 1 puff into the lungs every 12 (twelve) hours.     Marland Kitchen lisinopril-hydrochlorothiazide (PRINZIDE,ZESTORETIC) 20-25 MG per tablet Take 1 tablet by mouth Rogers after supper.    . nitroGLYCERIN (NITROSTAT) 0.4 MG SL tablet Place 1 tablet (0.4 mg total) under the tongue every 5 (five) minutes x 3 doses as needed for chest pain. 25 tablet 5  . predniSONE (DELTASONE) 5 MG tablet Take 5 mg by mouth Rogers after supper.    . ticagrelor (BRILINTA) 90 MG TABS tablet Take 1 tablet (90 mg total) by mouth 2 (two) times Rogers. 60 tablet 10   No current facility-administered medications for this visit.    Allergies:   No Known Allergies  Social History:  The patient  reports that she quit smoking about 3 months ago. Her smoking use included Cigarettes. She quit after 25 years of use. She does not have any smokeless tobacco history on file. She reports that she does not drink alcohol or use illicit drugs.   Family history:  History reviewed. No pertinent family history.  ROS:  Please see the history of present illness.  All other systems reviewed and negative.   PHYSICAL EXAM: VS:  BP 162/74 mmHg  Pulse 80  Ht 5\' 5"  (1.651 m)  Wt 137 lb (62.143 kg)  BMI 22.80 kg/m2 Well nourished, well developed, in no acute distress HEENT: Pupils are equal round react to light accommodation extraocular movements are intact.  Neck: no JVDNo cervical lymphadenopathy. Cardiac: Regular rate and rhythm without murmurs rubs or gallops. Lungs:  clear to auscultation bilaterally, no wheezing, rhonchi or rales Abd: soft, nontender, positive bowel sounds all quadrants, no hepatosplenomegaly Ext: no lower extremity edema.  2+ radial and dorsalis pedis pulses. Skin: warm and dry Neuro:  Grossly normal  Lipid Panel     Component Value Date/Time   CHOL 244* 04/14/2015 0500   TRIG  53 04/14/2015 0500   HDL 104 04/14/2015 0500   CHOLHDL 2.3 04/14/2015 0500   VLDL 11 04/14/2015 0500   LDLCALC 129* 04/14/2015 0500    EKG: Normal sinus rhythm rate 80 bpm.  ASSESSMENT AND PLAN:  Problem List Items Addressed This Visit    Hypertension - Primary   Tobacco abuse   CKD (chronic kidney disease), stage III   Coronary artery disease due to lipid rich plaque     Coronary artery disease: Status post drug-eluting stents to the mid and distal RCA. Continue aspirin, statin and Brilinta.  Interestingly the patient is a very good lipid panel with triglycerides of 53 and HDL of 104.  On Lipitor 80 mg Rogers. This may be able to be decreased based on her lipid profile which I'll defer to her primary care provider in January per her request.    Essential hypertension: Patient reports her blood pressure  is always elevated when she comes to the doctor's office. Recheck today showed the blood pressure come down. No changes in current medications.  Tobacco abuse: Patient has not smoked since her hospitalization. Encouraged her to continue this.  Pixie Casino, MD, [facility] Attending Cardiologist [place]

## 2015-11-24 DIAGNOSIS — I208 Other forms of angina pectoris: Secondary | ICD-10-CM | POA: Diagnosis not present

## 2015-11-24 DIAGNOSIS — I252 Old myocardial infarction: Secondary | ICD-10-CM | POA: Diagnosis not present

## 2015-11-24 DIAGNOSIS — N183 Chronic kidney disease, stage 3 (moderate): Secondary | ICD-10-CM | POA: Diagnosis not present

## 2015-11-24 DIAGNOSIS — D692 Other nonthrombocytopenic purpura: Secondary | ICD-10-CM | POA: Diagnosis not present

## 2015-11-24 DIAGNOSIS — Z1389 Encounter for screening for other disorder: Secondary | ICD-10-CM | POA: Diagnosis not present

## 2015-11-24 DIAGNOSIS — Z23 Encounter for immunization: Secondary | ICD-10-CM | POA: Diagnosis not present

## 2015-11-24 DIAGNOSIS — I2510 Atherosclerotic heart disease of native coronary artery without angina pectoris: Secondary | ICD-10-CM | POA: Diagnosis not present

## 2015-11-24 DIAGNOSIS — M1990 Unspecified osteoarthritis, unspecified site: Secondary | ICD-10-CM | POA: Diagnosis not present

## 2015-11-24 DIAGNOSIS — E7800 Pure hypercholesterolemia, unspecified: Secondary | ICD-10-CM | POA: Diagnosis not present

## 2015-11-24 DIAGNOSIS — J45909 Unspecified asthma, uncomplicated: Secondary | ICD-10-CM | POA: Diagnosis not present

## 2015-11-24 DIAGNOSIS — I779 Disorder of arteries and arterioles, unspecified: Secondary | ICD-10-CM | POA: Diagnosis not present

## 2015-11-24 DIAGNOSIS — I129 Hypertensive chronic kidney disease with stage 1 through stage 4 chronic kidney disease, or unspecified chronic kidney disease: Secondary | ICD-10-CM | POA: Diagnosis not present

## 2016-05-09 ENCOUNTER — Other Ambulatory Visit: Payer: Self-pay | Admitting: Cardiology

## 2016-05-09 NOTE — Telephone Encounter (Signed)
Rx request sent to pharmacy.  

## 2016-09-10 ENCOUNTER — Other Ambulatory Visit: Payer: Self-pay | Admitting: Internal Medicine

## 2016-09-27 ENCOUNTER — Emergency Department (HOSPITAL_COMMUNITY): Payer: Medicare Other

## 2016-09-27 ENCOUNTER — Inpatient Hospital Stay (HOSPITAL_COMMUNITY)
Admission: EM | Admit: 2016-09-27 | Discharge: 2016-09-29 | DRG: 683 | Disposition: A | Discharge status: Home / Self Care | Payer: Medicare Other | Attending: Internal Medicine | Admitting: Internal Medicine

## 2016-09-27 ENCOUNTER — Encounter (HOSPITAL_COMMUNITY): Payer: Self-pay

## 2016-09-27 DIAGNOSIS — N179 Acute kidney failure, unspecified: Secondary | ICD-10-CM | POA: Diagnosis not present

## 2016-09-27 DIAGNOSIS — I2583 Coronary atherosclerosis due to lipid rich plaque: Secondary | ICD-10-CM | POA: Diagnosis present

## 2016-09-27 DIAGNOSIS — Z7982 Long term (current) use of aspirin: Secondary | ICD-10-CM

## 2016-09-27 DIAGNOSIS — N183 Chronic kidney disease, stage 3 unspecified: Secondary | ICD-10-CM | POA: Diagnosis present

## 2016-09-27 DIAGNOSIS — I10 Essential (primary) hypertension: Secondary | ICD-10-CM | POA: Diagnosis present

## 2016-09-27 DIAGNOSIS — Z96641 Presence of right artificial hip joint: Secondary | ICD-10-CM | POA: Diagnosis present

## 2016-09-27 DIAGNOSIS — J449 Chronic obstructive pulmonary disease, unspecified: Secondary | ICD-10-CM | POA: Diagnosis present

## 2016-09-27 DIAGNOSIS — E860 Dehydration: Secondary | ICD-10-CM

## 2016-09-27 DIAGNOSIS — I951 Orthostatic hypotension: Secondary | ICD-10-CM | POA: Diagnosis present

## 2016-09-27 DIAGNOSIS — I129 Hypertensive chronic kidney disease with stage 1 through stage 4 chronic kidney disease, or unspecified chronic kidney disease: Secondary | ICD-10-CM | POA: Diagnosis present

## 2016-09-27 DIAGNOSIS — F17200 Nicotine dependence, unspecified, uncomplicated: Secondary | ICD-10-CM | POA: Diagnosis not present

## 2016-09-27 DIAGNOSIS — R05 Cough: Secondary | ICD-10-CM | POA: Diagnosis not present

## 2016-09-27 DIAGNOSIS — I2510 Atherosclerotic heart disease of native coronary artery without angina pectoris: Secondary | ICD-10-CM | POA: Diagnosis not present

## 2016-09-27 DIAGNOSIS — E872 Acidosis, unspecified: Secondary | ICD-10-CM | POA: Diagnosis present

## 2016-09-27 DIAGNOSIS — J101 Influenza due to other identified influenza virus with other respiratory manifestations: Secondary | ICD-10-CM | POA: Diagnosis present

## 2016-09-27 DIAGNOSIS — Z7952 Long term (current) use of systemic steroids: Secondary | ICD-10-CM

## 2016-09-27 DIAGNOSIS — I252 Old myocardial infarction: Secondary | ICD-10-CM

## 2016-09-27 DIAGNOSIS — I208 Other forms of angina pectoris: Secondary | ICD-10-CM | POA: Diagnosis not present

## 2016-09-27 DIAGNOSIS — J209 Acute bronchitis, unspecified: Secondary | ICD-10-CM | POA: Diagnosis not present

## 2016-09-27 DIAGNOSIS — Z7951 Long term (current) use of inhaled steroids: Secondary | ICD-10-CM

## 2016-09-27 DIAGNOSIS — D692 Other nonthrombocytopenic purpura: Secondary | ICD-10-CM | POA: Diagnosis not present

## 2016-09-27 DIAGNOSIS — Z87891 Personal history of nicotine dependence: Secondary | ICD-10-CM | POA: Diagnosis not present

## 2016-09-27 DIAGNOSIS — J45909 Unspecified asthma, uncomplicated: Secondary | ICD-10-CM | POA: Diagnosis present

## 2016-09-27 DIAGNOSIS — Z720 Tobacco use: Secondary | ICD-10-CM | POA: Diagnosis present

## 2016-09-27 DIAGNOSIS — J09X2 Influenza due to identified novel influenza A virus with other respiratory manifestations: Secondary | ICD-10-CM | POA: Diagnosis not present

## 2016-09-27 DIAGNOSIS — M1990 Unspecified osteoarthritis, unspecified site: Secondary | ICD-10-CM | POA: Diagnosis present

## 2016-09-27 HISTORY — DX: Unspecified osteoarthritis, unspecified site: M19.90

## 2016-09-27 HISTORY — DX: Chronic obstructive pulmonary disease, unspecified: J44.9

## 2016-09-27 HISTORY — DX: Chronic kidney disease, stage 3 unspecified: N18.30

## 2016-09-27 HISTORY — DX: Pure hypercholesterolemia, unspecified: E78.00

## 2016-09-27 HISTORY — DX: Heart failure, unspecified: I50.9

## 2016-09-27 HISTORY — DX: Chronic kidney disease, stage 3 (moderate): N18.3

## 2016-09-27 HISTORY — DX: Acute myocardial infarction, unspecified: I21.9

## 2016-09-27 LAB — URINALYSIS, ROUTINE W REFLEX MICROSCOPIC
Bilirubin Urine: NEGATIVE
GLUCOSE, UA: NEGATIVE mg/dL
Hgb urine dipstick: NEGATIVE
Ketones, ur: NEGATIVE mg/dL
NITRITE: NEGATIVE
PH: 5 (ref 5.0–8.0)
PROTEIN: NEGATIVE mg/dL
Specific Gravity, Urine: 1.012 (ref 1.005–1.030)

## 2016-09-27 LAB — I-STAT TROPONIN, ED: Troponin i, poc: 0.02 ng/mL (ref 0.00–0.08)

## 2016-09-27 LAB — BASIC METABOLIC PANEL
ANION GAP: 14 (ref 5–15)
BUN: 83 mg/dL — AB (ref 6–20)
CO2: 18 mmol/L — AB (ref 22–32)
Calcium: 8.2 mg/dL — ABNORMAL LOW (ref 8.9–10.3)
Chloride: 107 mmol/L (ref 101–111)
Creatinine, Ser: 4.54 mg/dL — ABNORMAL HIGH (ref 0.44–1.00)
GFR calc Af Amer: 10 mL/min — ABNORMAL LOW (ref >60)
GFR calc non Af Amer: 9 mL/min — ABNORMAL LOW (ref >60)
GLUCOSE: 111 mg/dL — AB (ref 65–99)
POTASSIUM: 4.2 mmol/L (ref 3.5–5.1)
Sodium: 139 mmol/L (ref 135–145)

## 2016-09-27 LAB — CBC WITH DIFFERENTIAL/PLATELET
Basophils Absolute: 0 10*3/uL (ref 0.0–0.1)
Basophils Relative: 0 %
EOS PCT: 0 %
Eosinophils Absolute: 0 10*3/uL (ref 0.0–0.7)
HEMATOCRIT: 34.5 % — AB (ref 36.0–46.0)
Hemoglobin: 11 g/dL — ABNORMAL LOW (ref 12.0–15.0)
LYMPHS PCT: 19 %
Lymphs Abs: 1.3 10*3/uL (ref 0.7–4.0)
MCH: 27.1 pg (ref 26.0–34.0)
MCHC: 31.9 g/dL (ref 30.0–36.0)
MCV: 85 fL (ref 78.0–100.0)
MONO ABS: 0.6 10*3/uL (ref 0.1–1.0)
MONOS PCT: 9 %
NEUTROS ABS: 4.8 10*3/uL (ref 1.7–7.7)
Neutrophils Relative %: 72 %
PLATELETS: 340 10*3/uL (ref 150–400)
RBC: 4.06 MIL/uL (ref 3.87–5.11)
RDW: 15.5 % (ref 11.5–15.5)
WBC: 6.6 10*3/uL (ref 4.0–10.5)

## 2016-09-27 LAB — INFLUENZA PANEL BY PCR (TYPE A & B)
Influenza A By PCR: POSITIVE — AB
Influenza B By PCR: NEGATIVE

## 2016-09-27 LAB — I-STAT CG4 LACTIC ACID, ED: Lactic Acid, Venous: 1.2 mmol/L (ref 0.5–1.9)

## 2016-09-27 MED ORDER — ONDANSETRON HCL 4 MG/2ML IJ SOLN: 4.0000 mg | Freq: Four times a day (QID) | INTRAMUSCULAR | Status: DC | PRN

## 2016-09-27 MED ORDER — SODIUM CHLORIDE 0.9 % IV BOLUS (SEPSIS)
1000.0000 mL | Freq: Once | INTRAVENOUS | Status: AC
  Administered 2016-09-27: 1000 mL via INTRAVENOUS

## 2016-09-27 MED ORDER — ENOXAPARIN SODIUM 30 MG/0.3ML ~~LOC~~ SOLN
30.0000 mg | SUBCUTANEOUS | Status: DC
  Administered 2016-09-27 – 2016-09-28 (×2): 30 mg via SUBCUTANEOUS
  Filled 2016-09-27 (×2): qty 0.3

## 2016-09-27 MED ORDER — ACETAMINOPHEN 325 MG PO TABS: 650.0000 mg | ORAL_TABLET | Freq: Four times a day (QID) | ORAL | Status: DC | PRN

## 2016-09-27 MED ORDER — TICAGRELOR 90 MG PO TABS
90.0000 mg | ORAL_TABLET | Freq: Two times a day (BID) | ORAL | Status: DC
  Administered 2016-09-27 – 2016-09-29 (×4): 90 mg via ORAL
  Filled 2016-09-27 (×4): qty 1

## 2016-09-27 MED ORDER — IPRATROPIUM-ALBUTEROL 0.5-2.5 (3) MG/3ML IN SOLN
3.0000 mL | Freq: Once | RESPIRATORY_TRACT | Status: AC
  Administered 2016-09-27: 3 mL via RESPIRATORY_TRACT
  Filled 2016-09-27: qty 3

## 2016-09-27 MED ORDER — OSELTAMIVIR PHOSPHATE 30 MG PO CAPS
30.0000 mg | ORAL_CAPSULE | Freq: Every day | ORAL | Status: DC
  Administered 2016-09-27 – 2016-09-29 (×3): 30 mg via ORAL
  Filled 2016-09-27 (×3): qty 1

## 2016-09-27 MED ORDER — ASPIRIN 81 MG PO CHEW
81.0000 mg | CHEWABLE_TABLET | Freq: Every day | ORAL | Status: DC
  Administered 2016-09-28 – 2016-09-29 (×2): 81 mg via ORAL
  Filled 2016-09-27 (×2): qty 1

## 2016-09-27 MED ORDER — PREDNISONE 5 MG PO TABS
5.0000 mg | ORAL_TABLET | Freq: Every day | ORAL | Status: DC
  Administered 2016-09-27 – 2016-09-28 (×2): 5 mg via ORAL
  Filled 2016-09-27 (×2): qty 1

## 2016-09-27 MED ORDER — MOMETASONE FURO-FORMOTEROL FUM 200-5 MCG/ACT IN AERO
2.0000 | INHALATION_SPRAY | Freq: Two times a day (BID) | RESPIRATORY_TRACT | Status: DC
  Administered 2016-09-28 – 2016-09-29 (×3): 2 via RESPIRATORY_TRACT
  Filled 2016-09-27: qty 8.8

## 2016-09-27 MED ORDER — ACETAMINOPHEN 650 MG RE SUPP: 650.0000 mg | Freq: Four times a day (QID) | RECTAL | Status: DC | PRN

## 2016-09-27 MED ORDER — SODIUM CHLORIDE 0.9 % IV SOLN
INTRAVENOUS | Status: DC
  Administered 2016-09-27: 100 mL/h via INTRAVENOUS
  Administered 2016-09-28 (×3): via INTRAVENOUS

## 2016-09-27 MED ORDER — IPRATROPIUM-ALBUTEROL 0.5-2.5 (3) MG/3ML IN SOLN: 3.0000 mL | RESPIRATORY_TRACT | Status: DC | PRN

## 2016-09-27 MED ORDER — ONDANSETRON HCL 4 MG PO TABS: 4.0000 mg | ORAL_TABLET | Freq: Four times a day (QID) | ORAL | Status: DC | PRN

## 2016-09-27 MED ORDER — SODIUM CHLORIDE 0.9% FLUSH
3.0000 mL | Freq: Two times a day (BID) | INTRAVENOUS | Status: DC
  Administered 2016-09-27 – 2016-09-29 (×2): 3 mL via INTRAVENOUS

## 2016-09-27 NOTE — ED Notes (Signed)
Patient transported to X-ray 

## 2016-09-27 NOTE — H&P (Signed)
History and Physical    Amber Rogers [id number] DOB: 09-19-1940 DOA: 09/27/2016   PCP: Simona Huh, MD   Patient coming from/Resides with: Private residence/Lives w/husband  Admission status: Observation/Telemetry-it may be medically necessary to stay a minimum 2 midnights to rule out impending and/or unexpected changes in physiologic status that may differ from initial evaluation performed in the ER and/or at time of admission therefore reevaluate admission status within the next 24 hours. Anticipate it may take more than 24 hours to restore to euvolemic status and return renal function to baseline.  Chief Complaint: Cough, congestion and orthostatic hypotension documented at PCP office  HPI: Amber Rogers is a 76 y.o. female with medical history significant for hypertension on ACE/thiazide diuretic combination, CAD with non-STEMI September 2016 on Brilinta, with right is on chronic prednisone, asthma/question COPD and former tobacco abuse and stage III chronic kidney disease. Patient developed cough and congestion beginning this past Friday. She felt poorly enough she did not attend church on Sunday which is atypical for her. She has not had any nausea or vomiting and she has had low-grade fevers up to 100.4 for the past 2 days. Her family family convinced her to see her PCP today and she was found to be quite orthostatic on evaluation so was sent to the ER for further evaluation and treatment. She did not receive an influenza vaccine this year.  ED Course:  Vital Signs: BP 109/61   Pulse 64   Temp 98.9 F (37.2 C)   Resp 18   SpO2 100%  2 view CXR: No acute cardiopulmonary findings Lab Data: Sodium 139, potassium 4.2, chloride 107, CO2 18, glucose 111, BUN 83, creatinine 4.54, POC troponin 0.02, lactic acid 1.2, white count 6600 with normal differential, hemoglobin 11, platelets 340,000, influenza A PCR positive, urinalysis with rare bacteria, trace leukocytes otherwise  unremarkable Medications and treatments: Normal saline bolus 2 L, DuoNeb 1  Review of Systems:  In addition to the HPI above,  No Headache, changes with Vision or hearing, new weakness, tingling, numbness in any extremity, dizziness, dysarthria or word finding difficulty, tremors or seizure activity No problems swallowing food or Liquids, indigestion/reflux, choking or coughing while eating, abdominal pain with or after eating No Chest pain, Shortness of Breath, palpitations, orthopnea or DOE No Abdominal pain, N/V, melena,hematochezia, dark tarry stools, constipation No dysuria, malodorous urine, hematuria or flank pain No new skin rashes, lesions, masses or bruises, No new joint pains, aches, swelling or redness No recent unintentional weight gain or loss No polyuria, polydypsia or polyphagia   Past Medical History:  Diagnosis Date  . Arthritis   . Asthma   . COPD (chronic obstructive pulmonary disease) ([place])   . Coronary artery disease   . Hypertension   . Pneumonia 1996    Past Surgical History:  Procedure Laterality Date  . CARDIAC CATHETERIZATION N/A 04/14/2015   Procedure: Left Heart Cath and Coronary Angiography;  Surgeon: Jolaine Artist, MD;  Location: [facility];  Service: Cardiovascular;  Laterality: N/A;  . CARDIAC CATHETERIZATION N/A 04/14/2015   Procedure: Coronary Stent Intervention;  Surgeon: Lorretta Harp, MD;  Location: [facility];  Service: Cardiovascular;  Laterality: N/A;  . right knee arthroscopy  2004  . TOTAL HIP ARTHROPLASTY Right 02/10/2013   Procedure: TOTAL HIP ARTHROPLASTY ANTERIOR APPROACH;  Surgeon: [name], MD;  Location: [facility];  Service: Orthopedics;  Laterality: Right;    Social History   Social History  .  Marital status: Divorced    Spouse name: N/A  . Number of children: N/A  . Years of education: N/A   Occupational History  . Not on file.   Social History Main Topics  . Smoking status: Former Smoker     Years: 25.00    Types: Cigarettes    Quit date: 04/13/2015  . Smokeless tobacco: Never Used     Comment: smokes occassionally-1-2 when nervous  . Alcohol use No  . Drug use: No  . Sexual activity: Not on file   Other Topics Concern  . Not on file   Social History Narrative  . No narrative on file    Mobility: Without assistive devices Work history: Not obtained   No Known Allergies  Family history reviewed and not pertinent to current admission findings and diagnosis   Prior to Admission medications   Medication Sig Start Date End Date Taking? Authorizing Provider  amLODipine (NORVASC) 10 MG tablet Take 10 mg by mouth daily after supper.   Yes Historical Provider, MD  aspirin 81 MG chewable tablet Chew 1 tablet (81 mg total) by mouth daily. 04/16/15  Yes Brittainy M Simmons, PA-C  BRILINTA 90 MG TABS tablet TAKE ONE TABLET BY MOUTH TWICE A DAY 09/10/16  Yes Pixie Casino, MD  Fluticasone-Salmeterol (ADVAIR) 250-50 MCG/DOSE AEPB Inhale 1 puff into the lungs every 12 (twelve) hours.    Yes Historical Provider, MD  lisinopril-hydrochlorothiazide (PRINZIDE,ZESTORETIC) 20-25 MG per tablet Take 1 tablet by mouth daily.    Yes Historical Provider, MD  nitroGLYCERIN (NITROSTAT) 0.4 MG SL tablet Place 1 tablet (0.4 mg total) under the tongue every 5 (five) minutes x 3 doses as needed for chest pain. 04/16/15  Yes Brittainy Erie Noe, PA-C  predniSONE (DELTASONE) 5 MG tablet Take 5 mg by mouth daily after supper.   Yes Historical Provider, MD  atorvastatin (LIPITOR) 80 MG tablet Take 1 tablet (80 mg total) by mouth daily at 6 PM. Patient not taking: Reported on 09/27/2016 04/16/15   Consuelo Pandy, PA-C    Physical Exam: Vitals:   09/27/16 1112 09/27/16 1130 09/27/16 1200  BP: (!) 90/52 103/70 109/61  Pulse: 66 72 64  Resp: 16 18 18   Temp: 98 F (36.7 C)  98.9 F (37.2 C)  TempSrc: Oral    SpO2: 97% 99% 100%      Constitutional: NAD, calm, comfortable Eyes: PERRL, lids and  conjunctivae normal ENMT: Mucous membranes are dry. Posterior pharynx clear of any exudate or lesions.Normal dentition.  Neck: normal, supple, no masses, no thyromegaly Respiratory: clear to auscultation bilaterally, no wheezing, no crackles. Normal respiratory effort. No accessory muscle use. Room air Cardiovascular: Regular rate and rhythm, no murmurs / rubs / gallops. No extremity edema. 2+ pedal pulses. No carotid bruits. Orthostatic vital signs were positive with systolic blood pressure dropping from 106 supine to 88 seated Abdomen: no tenderness, no masses palpated. No hepatosplenomegaly. Bowel sounds positive.  Musculoskeletal: no clubbing / cyanosis. No joint deformity upper and lower extremities. Good ROM, no contractures. Normal muscle tone.  Skin: no rashes, lesions, ulcers. No induration Neurologic: CN 2-12 grossly intact. Sensation intact, DTR normal. Strength 5/5 x all 4 extremities.  Psychiatric: Normal judgment and insight. Alert and oriented x 3. Normal mood.    Labs on Admission: I have personally reviewed following labs and imaging studies  CBC:  Recent Labs Lab 09/27/16 1200  WBC 6.6  NEUTROABS 4.8  HGB 11.0*  HCT 34.5*  MCV 85.0  PLT  [id number]   Basic Metabolic Panel:  Recent Labs Lab 09/27/16 1200  NA 139  K 4.2  CL 107  CO2 18*  GLUCOSE 111*  BUN 83*  CREATININE 4.54*  CALCIUM 8.2*   GFR: CrCl cannot be calculated (Unknown ideal weight.). Liver Function Tests: No results for input(s): AST, ALT, ALKPHOS, BILITOT, PROT, ALBUMIN in the last 168 hours. No results for input(s): LIPASE, AMYLASE in the last 168 hours. No results for input(s): AMMONIA in the last 168 hours. Coagulation Profile: No results for input(s): INR, PROTIME in the last 168 hours. Cardiac Enzymes: No results for input(s): CKTOTAL, CKMB, CKMBINDEX, TROPONINI in the last 168 hours. BNP (last 3 results) No results for input(s): PROBNP in the last 8760 hours. HbA1C: No results for  input(s): HGBA1C in the last 72 hours. CBG: No results for input(s): GLUCAP in the last 168 hours. Lipid Profile: No results for input(s): CHOL, HDL, LDLCALC, TRIG, CHOLHDL, LDLDIRECT in the last 72 hours. Thyroid Function Tests: No results for input(s): TSH, T4TOTAL, FREET4, T3FREE, THYROIDAB in the last 72 hours. Anemia Panel: No results for input(s): VITAMINB12, FOLATE, FERRITIN, TIBC, IRON, RETICCTPCT in the last 72 hours. Urine analysis:    Component Value Date/Time   COLORURINE YELLOW 02/02/2013 0909   APPEARANCEUR CLEAR 02/02/2013 0909   LABSPEC 1.020 02/02/2013 0909   PHURINE 5.5 02/02/2013 0909   GLUCOSEU NEGATIVE 02/02/2013 0909   HGBUR NEGATIVE 02/02/2013 0909   BILIRUBINUR NEGATIVE 02/02/2013 0909   KETONESUR NEGATIVE 02/02/2013 0909   PROTEINUR NEGATIVE 02/02/2013 0909   UROBILINOGEN 0.2 02/02/2013 0909   NITRITE NEGATIVE 02/02/2013 0909   LEUKOCYTESUR NEGATIVE 02/02/2013 0909   Sepsis Labs: @LABRCNTIP (procalcitonin:4,lacticidven:4) )No results found for this or any previous visit (from the past 240 hour(s)).   Radiological Exams on Admission: Dg Chest 2 View  Result Date: 09/27/2016 CLINICAL DATA:  Weakness and cough for 5 days. EXAM: CHEST  2 VIEW COMPARISON:  06/18/2015 FINDINGS: The cardiac silhouette, mediastinal an hilar contours are within normal limits and stable. The lungs are clear of acute process. No pleural effusion. The bony thorax is intact. IMPRESSION: No acute cardiopulmonary findings. Electronically Signed   By: Marijo Sanes M.D.   On: 09/27/2016 11:50    EKG: (Independently reviewed) Sinus rhythm with ventricular rate 63 bpm, QTC 429 ms, nonspecific lateral ST segment changes noted definitive ischemic changes, incomplete left bundle  Assessment/Plan Principal Problem:   Acute kidney injury on CKD (chronic kidney disease), stage III -Patient presents with generalized malaise in the setting of cough and congestion later found to be influenza A  positive -Patient appears to be dehydrated with positive orthostatic vital signs -Etiology secondary to acute infectious process/influenza with concomitant use of ACE inhibitor and thiazide diuretic -Hold offending meds -Follow labs -IVFs -Baseline: 16/1.25 -Current: 83/4.54 -*Have initially admitted as observation based on patient's insurance payor [employer] of [place] but if patient not clinically euvolemic with near normal renal function within the next 24 hours she will need to be changed to inpatient status*  Active Problems:   Metabolic acidosis, normal anion gap (NAG) -Secondary to dehydration and acute kidney injury -Repeat labs in a.m.    Influenza A - no evidence of pneumonia on chest x-ray and not hypoxemic -2 view chest x-ray in a.m. after hydration -Tamiflu 30 mg daily in the setting of severe acute kidney injury with low GFR-give for total of 5 days -No indication to begin empiric antibiotic therapies at this point    Hypertension -Given dehydration with orthostatic  hypotension holding preadmission Norvasc, lisinopril-hydrochlorothiazide combo    Coronary artery disease due to lipid rich plaque -Continue aspirin, Brilinta and Lipitor    Arthritis -Continue chronic prednisone 5 mg daily    Asthma -Allow for prn duo nebs -Continue therapeutic substitution for preadmission Advair    Tobacco abuse -With smoking 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      DVT prophylaxis: Lovenox dose adjusted for low GFR Code Status: Full  Family Communication: Husband and daughter at bedside Disposition Plan: Discharge back to home environment when medically stable Consults called: None     ELLIS,ALLISON L. ANP-BC [facility] Pager [phone]   If 7PM-7AM, please contact night-coverage [email or web address] Password [facility]  09/27/2016, 2:49 PM

## 2016-09-27 NOTE — ED Provider Notes (Signed)
[facility] DEPT Provider Note   CSN: [id number] Arrival date & time: 09/27/16  1054     History   Chief Complaint Chief Complaint  Patient presents with  . Fatigue    HPI Amber Rogers is a 76 y.o. female.  Amber Rogers is a 76 y.o. Female with a history of COPD, CAD, and hypertension who presents to the emergency department complaining of feeling generally weak and fatigued with associated lightheadedness with position change over the past several days. Patient reports 5 days ago she began feeling bad with a sore throat, fevers, body aches, nasal congestion and diarrhea. She reports the diarrhea has resolved. She is seen by her PCP who thought she might have pneumonia. She has not been on antibiotics. He does report having body aches and fevers that resolved yesterday. She is still having body aches. She believes she received her flu shot this year. She reports being lightheaded with position change. No room spinning dizziness. She denies any focal weakness. Patient denies headache, dizziness, focal weakness, loss of consciousness, headache, numbness, tingling, abdominal pain, vomiting, rashes, urinary symptoms, chest pain, shortness of breath.   The history is provided by the patient and medical records. No language interpreter was used.    Past Medical History:  Diagnosis Date  . Arthritis   . Asthma   . COPD (chronic obstructive pulmonary disease) ([place])   . Coronary artery disease   . Hypertension   . Pneumonia 1996    Patient Active Problem List   Diagnosis Date Noted  . Acute kidney injury ([place]) 09/27/2016  . Metabolic acidosis, normal anion gap (NAG) 09/27/2016  . Influenza A 09/27/2016  . Coronary artery disease due to lipid rich plaque 05/03/2015  . Headache   . Tobacco abuse 04/14/2015  . CKD (chronic kidney disease), stage III 04/14/2015  . Sinus bradycardia 04/14/2015  . NSVT (nonsustained ventricular tachycardia) ([place]) 04/14/2015  . NSTEMI (non-ST  elevated myocardial infarction) ([place]) 04/13/2015  . Hypertension   . Arthritis   . Pneumonia   . Asthma   . Expected blood loss anemia 02/11/2013  . 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 CARDIAC CATHETERIZATION N/A 04/14/2015   Procedure: Left Heart Cath and Coronary Angiography;  Surgeon: Jolaine Artist, MD;  Location: [facility];  Service: Cardiovascular;  Laterality: N/A;  . CARDIAC CATHETERIZATION N/A 04/14/2015   Procedure: Coronary Stent Intervention;  Surgeon: Lorretta Harp, MD;  Location: [place];  Service: Cardiovascular;  Laterality: N/A;  . right knee arthroscopy  2004  . TOTAL HIP ARTHROPLASTY Right 02/10/2013   Procedure: TOTAL HIP ARTHROPLASTY ANTERIOR APPROACH;  Surgeon: [name], MD;  Location: [facility];  Service: Orthopedics;  Laterality: Right;    OB History    No data available       Home Medications    Prior to Admission medications   Medication Sig Start Date End Date Taking? Authorizing Provider  amLODipine (NORVASC) 10 MG tablet Take 10 mg by mouth daily after supper.   Yes Historical Provider, MD  aspirin 81 MG chewable tablet Chew 1 tablet (81 mg total) by mouth daily. 04/16/15  Yes Brittainy M Simmons, PA-C  BRILINTA 90 MG TABS tablet TAKE ONE TABLET BY MOUTH TWICE A DAY 09/10/16  Yes Pixie Casino, MD  Fluticasone-Salmeterol (ADVAIR) 250-50 MCG/DOSE AEPB Inhale 1 puff into the lungs every 12 (twelve) hours.    Yes Historical  Provider, MD  lisinopril-hydrochlorothiazide (PRINZIDE,ZESTORETIC) 20-25 MG per tablet Take 1 tablet by mouth daily.    Yes Historical Provider, MD  nitroGLYCERIN (NITROSTAT) 0.4 MG SL tablet Place 1 tablet (0.4 mg total) under the tongue every 5 (five) minutes x 3 doses as needed for chest pain. 04/16/15  Yes Brittainy Erie Noe, PA-C  predniSONE (DELTASONE) 5 MG tablet Take 5 mg by mouth daily after supper.   Yes Historical Provider, MD  atorvastatin  (LIPITOR) 80 MG tablet Take 1 tablet (80 mg total) by mouth daily at 6 PM. Patient not taking: Reported on 09/27/2016 04/16/15   Brittainy Erie Noe, PA-C    Family History No family history on file.  Social History Social History  Substance Use Topics  . Smoking status: Former Smoker    Years: 25.00    Types: Cigarettes    Quit date: 04/13/2015  . Smokeless tobacco: Never Used     Comment: smokes occassionally-1-2 when nervous  . Alcohol use No     Allergies   Patient has no known allergies.   Review of Systems Review of Systems  Constitutional: Positive for chills, fatigue and fever.  HENT: Positive for congestion, rhinorrhea and sore throat. Negative for trouble swallowing and voice change.   Eyes: Negative for visual disturbance.  Respiratory: Positive for cough. Negative for shortness of breath and wheezing.   Cardiovascular: Negative for chest pain and palpitations.  Gastrointestinal: Positive for diarrhea (resolved ). Negative for abdominal pain, nausea and vomiting.  Genitourinary: Negative for difficulty urinating, dysuria, frequency, hematuria and urgency.  Musculoskeletal: Negative for back pain and neck pain.  Skin: Negative for rash.  Neurological: Positive for light-headedness. Negative for syncope, weakness, numbness and headaches.     Physical Exam Updated Vital Signs BP (!) 118/50   Pulse 68   Temp 98.9 F (37.2 C)   Resp 26   SpO2 100%   Physical Exam  Constitutional: She is oriented to person, place, and time. She appears well-developed and well-nourished. No distress.  Nontoxic appearing.  HENT:  Head: Normocephalic and atraumatic.  Right Ear: External ear normal.  Left Ear: External ear normal.  Mouth/Throat: Oropharynx is clear and moist.  Mucous membranes are slightly dry.   Eyes: Conjunctivae and EOM are normal. Pupils are equal, round, and reactive to light. Right eye exhibits no discharge. Left eye exhibits no discharge.  Neck: Neck  supple. No JVD present. No tracheal deviation present.  Cardiovascular: Normal rate, regular rhythm, normal heart sounds and intact distal pulses.  Exam reveals no gallop and no friction rub.   No murmur heard. Pulmonary/Chest: Effort normal. No stridor. No respiratory distress. She has wheezes. She has no rales.  Slight expiratory wheezes noted bilaterally. No increased work of breathing. No rales or rhonchi.  Abdominal: Soft. Bowel sounds are normal. She exhibits no distension and no mass. There is no tenderness. There is no guarding.  Abdomen is soft and nontender to palpation.  Musculoskeletal: She exhibits no edema or tenderness.  No lower extremity edema or tenderness. Good strength in her bilateral upper and lower extremities.  Lymphadenopathy:    She has no cervical adenopathy.  Neurological: She is alert and oriented to person, place, and time. No sensory deficit. She exhibits normal muscle tone. Coordination normal.  Skin: Skin is warm and dry. Capillary refill takes less than 2 seconds. No rash noted. She is not diaphoretic. No erythema. No pallor.  Psychiatric: She has a normal mood and affect. Her behavior is normal.  Nursing note and vitals reviewed.    ED Treatments / Results  Labs (all labs ordered are listed, but only abnormal results are displayed) Labs Reviewed  BASIC METABOLIC PANEL - Abnormal; Notable for the following:       Result Value   CO2 18 (*)    Glucose, Bld 111 (*)    BUN 83 (*)    Creatinine, Ser 4.54 (*)    Calcium 8.2 (*)    GFR calc non Af Amer 9 (*)    GFR calc Af Amer 10 (*)    All other components within normal limits  CBC WITH DIFFERENTIAL/PLATELET - Abnormal; Notable for the following:    Hemoglobin 11.0 (*)    HCT 34.5 (*)    All other components within normal limits  INFLUENZA PANEL BY PCR (TYPE A & B) - Abnormal; Notable for the following:    Influenza A By PCR POSITIVE (*)    All other components within normal limits  URINALYSIS,  ROUTINE W REFLEX MICROSCOPIC - Abnormal; Notable for the following:    Leukocytes, UA TRACE (*)    Bacteria, UA RARE (*)    Squamous Epithelial / LPF 0-5 (*)    All other components within normal limits  I-STAT CG4 LACTIC ACID, ED  Randolm Idol, ED    EKG  EKG Interpretation None       Radiology Dg Chest 2 View  Result Date: 09/27/2016 CLINICAL DATA:  Weakness and cough for 5 days. EXAM: CHEST  2 VIEW COMPARISON:  06/18/2015 FINDINGS: The cardiac silhouette, mediastinal an hilar contours are within normal limits and stable. The lungs are clear of acute process. No pleural effusion. The bony thorax is intact. IMPRESSION: No acute cardiopulmonary findings. Electronically Signed   By: Marijo Sanes M.D.   On: 09/27/2016 11:50    Procedures Procedures (including critical care time)  Medications Ordered in ED Medications  0.9 %  sodium chloride infusion ( Intravenous Transfusing/Transfer 09/27/16 1531)  ticagrelor (BRILINTA) tablet 90 mg (not administered)  aspirin chewable tablet 81 mg (not administered)  mometasone-formoterol (DULERA) 200-5 MCG/ACT inhaler 2 puff (not administered)  predniSONE (DELTASONE) tablet 5 mg (not administered)  enoxaparin (LOVENOX) injection 30 mg (not administered)  sodium chloride flush (NS) 0.9 % injection 3 mL (not administered)  acetaminophen (TYLENOL) tablet 650 mg (not administered)    Or  acetaminophen (TYLENOL) suppository 650 mg (not administered)  ondansetron (ZOFRAN) tablet 4 mg (not administered)    Or  ondansetron (ZOFRAN) injection 4 mg (not administered)  ipratropium-albuterol (DUONEB) 0.5-2.5 (3) MG/3ML nebulizer solution 3 mL (not administered)  oseltamivir (TAMIFLU) capsule 30 mg (not administered)  sodium chloride 0.9 % bolus 1,000 mL (0 mLs Intravenous Stopped 09/27/16 1308)  sodium chloride 0.9 % bolus 1,000 mL (0 mLs Intravenous Stopped 09/27/16 1527)  ipratropium-albuterol (DUONEB) 0.5-2.5 (3) MG/3ML nebulizer solution 3 mL  (3 mLs Nebulization Given 09/27/16 1309)     Initial Impression / Assessment and Plan / ED Course  I have reviewed the triage vital signs and the nursing notes.  Pertinent labs & imaging results that were available during my care of the patient were reviewed by me and considered in my medical decision making (see chart for details).    This is a 76 y.o. Female with a history of COPD, CAD, and hypertension who presents to the emergency department complaining of feeling generally weak and fatigued with associated lightheadedness with position change over the past several days. Patient reports 5 days ago she began  feeling bad with a sore throat, fevers, body aches, nasal congestion and diarrhea. She reports the diarrhea has resolved. She is seen by her PCP who thought she might have pneumonia. She has not been on antibiotics. He does report having body aches and fevers that resolved yesterday. She is still having body aches. She believes she received her flu shot this year. She reports being lightheaded with position change. No room spinning dizziness. She denies any focal weakness. On exam the patient is afebrile and nontoxic appearing. She is initially hypotensive with a pressure of 90 systolic. She is late expiratory wheeze noted bilaterally. No increased work of breathing. She denies feeling short of breath. No focal weakness appreciated. Her abdomen is soft and nontender to palpation. Lactic acid is within normal limits. Troponin is not elevated. CBC shows no leukocytosis. BMP is remarkable for a creatinine of 4.54. Patient's baseline is 1.2. Chest x-ray is without infiltrate. Patient with acute kidney injury. Influenza A is also positive. Patient received 2 L fluid bolus. Urine is without infection. Will consult for admission for acute kidney injury and influenza. Patient agrees with plan for admission.   I consulted with Ebony Hail NP from [facility]. She accepted the patient for admission.    This patient was discussed with and evaluated by Dr. Leonette Monarch who agrees with assessment and plan.   Final Clinical Impressions(s) / ED Diagnoses   Final diagnoses:  Influenza A  AKI (acute kidney injury) ([place])  Dehydration    New Prescriptions New Prescriptions   No medications on file     Waynetta Pean, PA-C 09/27/16 [place], MD 09/27/16 [phone]

## 2016-09-27 NOTE — Progress Notes (Signed)
Patient trasfered from ED to [phone] via stretcher; alert and oriented x 4; no complaints of pain; IV in LAC running fluids@100cc /hr. Orient patient to room and unit; gave patient care guide; instructed how to use the call bell and  fall risk precautions. Will continue to monitor the patient.

## 2016-09-27 NOTE — ED Triage Notes (Signed)
Patient here from MD office to be evaluated for pneumonia. Has had cough and congestion since Sunday, alert and oriented, doesn't appear ill

## 2016-09-28 ENCOUNTER — Observation Stay (HOSPITAL_COMMUNITY): Payer: Medicare Other

## 2016-09-28 DIAGNOSIS — J101 Influenza due to other identified influenza virus with other respiratory manifestations: Secondary | ICD-10-CM

## 2016-09-28 DIAGNOSIS — Z720 Tobacco use: Secondary | ICD-10-CM

## 2016-09-28 DIAGNOSIS — Z7952 Long term (current) use of systemic steroids: Secondary | ICD-10-CM | POA: Diagnosis not present

## 2016-09-28 DIAGNOSIS — I2510 Atherosclerotic heart disease of native coronary artery without angina pectoris: Secondary | ICD-10-CM | POA: Diagnosis present

## 2016-09-28 DIAGNOSIS — J449 Chronic obstructive pulmonary disease, unspecified: Secondary | ICD-10-CM | POA: Diagnosis present

## 2016-09-28 DIAGNOSIS — M1990 Unspecified osteoarthritis, unspecified site: Secondary | ICD-10-CM | POA: Diagnosis present

## 2016-09-28 DIAGNOSIS — I2583 Coronary atherosclerosis due to lipid rich plaque: Secondary | ICD-10-CM | POA: Diagnosis present

## 2016-09-28 DIAGNOSIS — N183 Chronic kidney disease, stage 3 (moderate): Secondary | ICD-10-CM | POA: Diagnosis present

## 2016-09-28 DIAGNOSIS — E872 Acidosis: Secondary | ICD-10-CM

## 2016-09-28 DIAGNOSIS — I10 Essential (primary) hypertension: Secondary | ICD-10-CM | POA: Diagnosis not present

## 2016-09-28 DIAGNOSIS — Z87891 Personal history of nicotine dependence: Secondary | ICD-10-CM | POA: Diagnosis not present

## 2016-09-28 DIAGNOSIS — Z7982 Long term (current) use of aspirin: Secondary | ICD-10-CM | POA: Diagnosis not present

## 2016-09-28 DIAGNOSIS — E860 Dehydration: Secondary | ICD-10-CM | POA: Diagnosis present

## 2016-09-28 DIAGNOSIS — I129 Hypertensive chronic kidney disease with stage 1 through stage 4 chronic kidney disease, or unspecified chronic kidney disease: Secondary | ICD-10-CM | POA: Diagnosis present

## 2016-09-28 DIAGNOSIS — Z96641 Presence of right artificial hip joint: Secondary | ICD-10-CM | POA: Diagnosis present

## 2016-09-28 DIAGNOSIS — J9811 Atelectasis: Secondary | ICD-10-CM | POA: Diagnosis not present

## 2016-09-28 DIAGNOSIS — I951 Orthostatic hypotension: Secondary | ICD-10-CM | POA: Diagnosis present

## 2016-09-28 DIAGNOSIS — N179 Acute kidney failure, unspecified: Secondary | ICD-10-CM | POA: Diagnosis present

## 2016-09-28 DIAGNOSIS — Z7951 Long term (current) use of inhaled steroids: Secondary | ICD-10-CM | POA: Diagnosis not present

## 2016-09-28 DIAGNOSIS — I252 Old myocardial infarction: Secondary | ICD-10-CM | POA: Diagnosis not present

## 2016-09-28 LAB — BASIC METABOLIC PANEL
Anion gap: 9 (ref 5–15)
BUN: 64 mg/dL — ABNORMAL HIGH (ref 6–20)
CHLORIDE: 113 mmol/L — AB (ref 101–111)
CO2: 17 mmol/L — ABNORMAL LOW (ref 22–32)
Calcium: 7.9 mg/dL — ABNORMAL LOW (ref 8.9–10.3)
Creatinine, Ser: 2.52 mg/dL — ABNORMAL HIGH (ref 0.44–1.00)
GFR, EST AFRICAN AMERICAN: 21 mL/min — AB (ref >60)
GFR, EST NON AFRICAN AMERICAN: 18 mL/min — AB (ref >60)
Glucose, Bld: 101 mg/dL — ABNORMAL HIGH (ref 65–99)
POTASSIUM: 3.9 mmol/L (ref 3.5–5.1)
SODIUM: 139 mmol/L (ref 135–145)

## 2016-09-28 LAB — CBC
HEMATOCRIT: 29.7 % — AB (ref 36.0–46.0)
Hemoglobin: 9.4 g/dL — ABNORMAL LOW (ref 12.0–15.0)
MCH: 27 pg (ref 26.0–34.0)
MCHC: 31.6 g/dL (ref 30.0–36.0)
MCV: 85.3 fL (ref 78.0–100.0)
PLATELETS: 305 10*3/uL (ref 150–400)
RBC: 3.48 MIL/uL — ABNORMAL LOW (ref 3.87–5.11)
RDW: 15.3 % (ref 11.5–15.5)
WBC: 4.4 10*3/uL (ref 4.0–10.5)

## 2016-09-28 MED ORDER — GUAIFENESIN 100 MG/5ML PO SOLN
5.0000 mL | ORAL | Status: DC | PRN
  Administered 2016-09-28 – 2016-09-29 (×2): 100 mg via ORAL
  Filled 2016-09-28 (×2): qty 25

## 2016-09-28 MED ORDER — BENZONATATE 100 MG PO CAPS
100.0000 mg | ORAL_CAPSULE | Freq: Three times a day (TID) | ORAL | Status: DC | PRN
  Administered 2016-09-28 – 2016-09-29 (×2): 100 mg via ORAL
  Filled 2016-09-28 (×2): qty 1

## 2016-09-28 NOTE — Progress Notes (Signed)
PROGRESS NOTE    Amber Rogers  [id number] DOB: [birth year] DOA: 09/27/2016 PCP: Simona Huh, MD   Outpatient Specialists:     Brief Narrative:  Amber Rogers is a 76 y.o. female with medical history significant for hypertension on ACE/thiazide diuretic combination, CAD with non-STEMI September 2016 on Brilinta, with right is on chronic prednisone, asthma/question COPD and former tobacco abuse and stage III chronic kidney disease. Patient developed cough and congestion beginning this past Friday. She felt poorly enough she did not attend church on Sunday which is atypical for her. She has not had any nausea or vomiting and she has had low-grade fevers up to 100.4 for the past 2 days. Her family family convinced her to see her PCP today and she was found to be quite orthostatic on evaluation so was sent to the ER for further evaluation and treatment. She did not receive an influenza vaccine this year.   Assessment & Plan:   Principal Problem:   Acute kidney injury (HCC) Active Problems:   Hypertension   Arthritis   Asthma   Tobacco abuse   CKD (chronic kidney disease), stage III   Coronary artery disease due to lipid rich plaque   Metabolic acidosis, normal anion gap (NAG)   Influenza A   Dehydration    Acute kidney injury on CKD (chronic kidney disease), stage III -Patient presents with generalized malaise in the setting of cough and congestion later found to be influenza A positive -Patient appears to be dehydrated with positive orthostatic vital signs -Etiology secondary to acute infectious process/influenza with concomitant use of ACE inhibitor and thiazide diuretic -IVFs -Baseline: 16/1.25    Metabolic acidosis, normal anion gap (NAG) -Secondary to dehydration and acute kidney injury -Repeat labs in a.m.    Influenza A - no evidence of pneumonia on chest x-ray and not hypoxemic -Tamiflu 30 mg daily in the setting of severe acute kidney injury with low GFR-give  for total of 5 days -No indication to begin empiric antibiotic therapies at this point -PO intake still diminished    Hypertension -Given dehydration with orthostatic hypotension holding preadmission Norvasc, lisinopril-hydrochlorothiazide combo    Coronary artery disease due to lipid rich plaque -Continue aspirin, Brilinta and Lipitor    Arthritis -Continue chronic prednisone 5 mg daily    Asthma -Allow for prn duo nebs -Continue therapeutic substitution for preadmission Advair    Tobacco abuse -Quit smoking 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   DVT prophylaxis:  Lovenox   Code Status: Full Code   Family Communication: patient  Disposition Plan:  Suspect home in AM if Renal function improved   Consultants:        Subjective: Good urine output No CP, + cough  Objective: Vitals:   09/27/16 1515 09/27/16 1547 09/27/16 2152 09/28/16 0652  BP: (!) 118/50 (!) 132/54 (!) 128/59 (!) 133/59  Pulse: 68 72 64 77  Resp: 26 18 20 18  Temp:  98.1 F (36.7 C) 98.9 F (37.2 C) 97.9 F (36.6 C)  TempSrc:  Oral Oral Oral  SpO2: 100% 100% 99% 94%  Weight:  65.1 kg (143 lb 8 oz)    Height:  5\' 6" (1.676 m)      Intake/Output Summary (Last 24 hours) at 09/28/16 0802 Last data filed at 09/28/16 0400  Gross per 24 hour  Intake          13 73.33 ml  Output  0 ml  Net          1373.33 ml   Filed Weights   09/27/16 1547  Weight: 65.1 kg (143 lb 8 oz)    Examination:  General exam: Appears calm and comfortable  Respiratory system: no wheezing, no increased work of breathing Cardiovascular system: S1 & S2 heard, RRR. No JVD, murmurs, rubs, gallops or clicks. No pedal edema. Gastrointestinal system: Abdomen is nondistended, soft and nontender. No organomegaly or masses felt. Normal bowel sounds heard. Central nervous system: Alert and oriented. No focal neurological deficits. .     Data Reviewed: I have personally reviewed following labs and imaging  studies  CBC:  Recent Labs Lab 09/27/16 1200 09/28/16 0526  WBC 6.6 4.4  NEUTROABS 4.8  --   HGB 11.0* 9.4*  HCT 34.5* 29.7*  MCV 85.0 85.3  PLT 340 [id number]   Basic Metabolic Panel:  Recent Labs Lab 09/27/16 1200 09/28/16 0526  NA 139 139  K 4.2 3.9  CL 107 113*  CO2 18* 17*  GLUCOSE 111* 101*  BUN 83* 64*  CREATININE 4.54* 2.52*  CALCIUM 8.2* 7.9*   GFR: Estimated Creatinine Clearance: 18.6 mL/min (by C-G formula based on SCr of 2.52 mg/dL (H)). Liver Function Tests: No results for input(s): AST, ALT, ALKPHOS, BILITOT, PROT, ALBUMIN in the last 168 hours. No results for input(s): LIPASE, AMYLASE in the last 168 hours. No results for input(s): AMMONIA in the last 168 hours. Coagulation Profile: No results for input(s): INR, PROTIME in the last 168 hours. Cardiac Enzymes: No results for input(s): CKTOTAL, CKMB, CKMBINDEX, TROPONINI in the last 168 hours. BNP (last 3 results) No results for input(s): PROBNP in the last 8760 hours. HbA1C: No results for input(s): HGBA1C in the last 72 hours. CBG: No results for input(s): GLUCAP in the last 168 hours. Lipid Profile: No results for input(s): CHOL, HDL, LDLCALC, TRIG, CHOLHDL, LDLDIRECT in the last 72 hours. Thyroid Function Tests: No results for input(s): TSH, T4TOTAL, FREET4, T3FREE, THYROIDAB in the last 72 hours. Anemia Panel: No results for input(s): VITAMINB12, FOLATE, FERRITIN, TIBC, IRON, RETICCTPCT in the last 72 hours. Urine analysis:    Component Value Date/Time   COLORURINE YELLOW 09/27/2016 1424   APPEARANCEUR CLEAR 09/27/2016 1424   LABSPEC 1.012 09/27/2016 1424   PHURINE 5.0 09/27/2016 1424   GLUCOSEU NEGATIVE 09/27/2016 1424   HGBUR NEGATIVE 09/27/2016 [place] 09/27/2016 1424   KETONESUR NEGATIVE 09/27/2016 1424   PROTEINUR NEGATIVE 09/27/2016 1424   UROBILINOGEN 0.2 02/02/2013 0909   NITRITE NEGATIVE 09/27/2016 1424   LEUKOCYTESUR TRACE (A) 09/27/2016 1424     )No  results found for this or any previous visit (from the past 240 hour(s)).    Anti-infectives    Start     Dose/Rate Route Frequency Ordered Stop   09/27/16 1515  oseltamivir (TAMIFLU) capsule 30 mg     30 mg Oral Daily 09/27/16 1503 10/02/16 0959       Radiology Studies: X-ray Chest Pa And Lateral  Result Date: 09/28/2016 CLINICAL DATA:  Recent influenza.  Hypertension. EXAM: CHEST  2 VIEW COMPARISON:  September 27, 2016 FINDINGS: There is mild atelectatic change in the right base. Lungs elsewhere are clear. Heart is upper normal in size with pulmonary vascularity within normal limits. No adenopathy. No bone lesions. IMPRESSION: Mild right base atelectasis. No new opacity. No consolidation or edema. Stable cardiac silhouette. Electronically Signed   By: Lowella Grip III M.D.   On: 09/28/2016 07:28  Dg Chest 2 View  Result Date: 09/27/2016 CLINICAL DATA:  Weakness and cough for 5 days. EXAM: CHEST  2 VIEW COMPARISON:  06/18/2015 FINDINGS: The cardiac silhouette, mediastinal an hilar contours are within normal limits and stable. The lungs are clear of acute process. No pleural effusion. The bony thorax is intact. IMPRESSION: No acute cardiopulmonary findings. Electronically Signed   By: Marijo Sanes M.D.   On: 09/27/2016 11:50        Scheduled Meds: . aspirin  81 mg Oral Daily  . enoxaparin (LOVENOX) injection  30 mg Subcutaneous Q24H  . mometasone-formoterol  2 puff Inhalation BID  . oseltamivir  30 mg Oral Daily  . predniSONE  5 mg Oral QPC supper  . sodium chloride flush  3 mL Intravenous Q12H  . ticagrelor  90 mg Oral BID   Continuous Infusions: . sodium chloride 100 mL/hr at 09/28/16 0315     LOS: 0 days    Time spent: 25 min    [place], DO [facility] Pager [phone]  If 7PM-7AM, please contact night-coverage [email or web address] Password [email or web address] 09/28/2016, 8:02 AM

## 2016-09-29 LAB — CBC
HEMATOCRIT: 27.6 % — AB (ref 36.0–46.0)
HEMOGLOBIN: 8.8 g/dL — AB (ref 12.0–15.0)
MCH: 27.2 pg (ref 26.0–34.0)
MCHC: 31.9 g/dL (ref 30.0–36.0)
MCV: 85.4 fL (ref 78.0–100.0)
Platelets: 300 10*3/uL (ref 150–400)
RBC: 3.23 MIL/uL — ABNORMAL LOW (ref 3.87–5.11)
RDW: 15.4 % (ref 11.5–15.5)
WBC: 6 10*3/uL (ref 4.0–10.5)

## 2016-09-29 LAB — BASIC METABOLIC PANEL
Anion gap: 6 (ref 5–15)
BUN: 41 mg/dL — ABNORMAL HIGH (ref 6–20)
CHLORIDE: 118 mmol/L — AB (ref 101–111)
CO2: 21 mmol/L — AB (ref 22–32)
CREATININE: 1.7 mg/dL — AB (ref 0.44–1.00)
Calcium: 8.1 mg/dL — ABNORMAL LOW (ref 8.9–10.3)
GFR calc non Af Amer: 29 mL/min — ABNORMAL LOW (ref >60)
GFR, EST AFRICAN AMERICAN: 33 mL/min — AB (ref >60)
Glucose, Bld: 105 mg/dL — ABNORMAL HIGH (ref 65–99)
POTASSIUM: 4.6 mmol/L (ref 3.5–5.1)
SODIUM: 145 mmol/L (ref 135–145)

## 2016-09-29 MED ORDER — ALBUTEROL SULFATE HFA 108 (90 BASE) MCG/ACT IN AERS: 2.0000 | INHALATION_SPRAY | Freq: Four times a day (QID) | RESPIRATORY_TRACT | 0 refills | Status: DC | PRN

## 2016-09-29 MED ORDER — OSELTAMIVIR PHOSPHATE 30 MG PO CAPS: 30.0000 mg | ORAL_CAPSULE | Freq: Every day | ORAL | 0 refills | Status: AC

## 2016-09-29 MED ORDER — AMLODIPINE BESYLATE 10 MG PO TABS: 5.0000 mg | ORAL_TABLET | Freq: Every day | ORAL | 0 refills | Status: DC

## 2016-09-29 MED ORDER — BENZONATATE 100 MG PO CAPS: 100.0000 mg | ORAL_CAPSULE | Freq: Three times a day (TID) | ORAL | 0 refills | Status: DC | PRN

## 2016-09-29 MED ORDER — MOMETASONE FURO-FORMOTEROL FUM 200-5 MCG/ACT IN AERO: 2.0000 | INHALATION_SPRAY | Freq: Two times a day (BID) | RESPIRATORY_TRACT | 0 refills | Status: DC

## 2016-09-29 MED ORDER — AMLODIPINE BESYLATE 5 MG PO TABS
5.0000 mg | ORAL_TABLET | Freq: Every day | ORAL | Status: DC
  Administered 2016-09-29: 5 mg via ORAL
  Filled 2016-09-29: qty 1

## 2016-09-29 MED ORDER — PREDNISONE 5 MG PO TABS: ORAL_TABLET | ORAL | 0 refills | Status: DC

## 2016-09-29 NOTE — Progress Notes (Signed)
Pt ambulated hall with oxygen saturation at 98% and above on room air.

## 2016-09-29 NOTE — Progress Notes (Signed)
NURSING PROGRESS NOTE  Amber Rogers [id number] Discharge Data: 09/29/2016 2:16 PM Attending Provider: Lavina Hamman, MD [id number] R, MD     [name] to be D/C'd Home per MD order.  Discussed with the patient the After Visit Summary and all questions fully answered. All IV's discontinued with no bleeding noted. All belongings returned to patient for patient to take home.   Last Vital Signs:  Blood pressure (!) 154/73, pulse 68, temperature 98.3 F (36.8 C), temperature source Oral, resp. rate 16, height 5\' 6"  (1.676 m), weight 65.1 kg (143 lb 8 oz), SpO2 98 %.  Discharge Medication List Allergies as of 09/29/2016   No Known Allergies     Medication List    STOP taking these medications   Fluticasone-Salmeterol 250-50 MCG/DOSE Aepb Commonly known as:  ADVAIR Replaced by:  mometasone-formoterol 200-5 MCG/ACT Aero   lisinopril-hydrochlorothiazide 20-25 MG tablet Commonly known as:  PRINZIDE,ZESTORETIC     TAKE these medications   albuterol 108 (90 Base) MCG/ACT inhaler Commonly known as:  PROVENTIL HFA;VENTOLIN HFA Inhale 2 puffs into the lungs every 6 (six) hours as needed for wheezing or shortness of breath.   amLODipine 10 MG tablet Commonly known as:  NORVASC Take 0.5 tablets (5 mg total) by mouth daily after supper. What changed:  how much to take   aspirin 81 MG chewable tablet Chew 1 tablet (81 mg total) by mouth daily.   atorvastatin 80 MG tablet Commonly known as:  LIPITOR Take 1 tablet (80 mg total) by mouth daily at 6 PM.   benzonatate 100 MG capsule Commonly known as:  TESSALON Take 1 capsule (100 mg total) by mouth 3 (three) times daily as needed for cough.   BRILINTA 90 MG Tabs tablet Generic drug:  ticagrelor TAKE ONE TABLET BY MOUTH TWICE A DAY   mometasone-formoterol 200-5 MCG/ACT Aero Commonly known as:  DULERA Inhale 2 puffs into the lungs 2 (two) times daily. Replaces:  Fluticasone-Salmeterol 250-50 MCG/DOSE Aepb   nitroGLYCERIN  0.4 MG SL tablet Commonly known as:  NITROSTAT Place 1 tablet (0.4 mg total) under the tongue every 5 (five) minutes x 3 doses as needed for chest pain.   oseltamivir 30 MG capsule Commonly known as:  TAMIFLU Take 1 capsule (30 mg total) by mouth daily. Start taking on:  09/30/2016   predniSONE 5 MG tablet Commonly known as:  DELTASONE Take 20mg  daily for 3days,Take 10mg  daily for 3days, then start taking 5mg  daily. What changed:  how much to take  how to take this  when to take this  additional instructions

## 2016-10-02 NOTE — Discharge Summary (Signed)
[facility] Discharge Summary   Patient: Amber Rogers [id number]   PCP: Simona Huh, MD DOB: 04/20/1941   Date of admission: 09/27/2016   Date of discharge: 09/29/2016    Discharge Diagnoses:  Principal Problem:   Acute kidney injury [facility]) Active Problems:   Hypertension   Arthritis   Asthma   Tobacco abuse   CKD (chronic kidney disease), stage III   Coronary artery disease due to lipid rich plaque   Metabolic acidosis, normal anion gap (NAG)   Influenza A   Dehydration   AKI (acute kidney injury) ([place])   Admitted From: home Disposition:  home  Recommendations for Outpatient Follow-up:  1. Follow-up with PCP in one week with BMP   Follow-up Information    Simona Huh, MD. Schedule an appointment as soon as possible for a visit in 1 week(s).   Specialty:  Family Medicine Contact information: [address] [place] [address] [phone]          Diet recommendation: Cardiac diet  Activity: The patient is advised to gradually reintroduce usual activities.  Discharge Condition: good  Code Status: Full code  History of present illness: As per the H and P dictated on admission, "Amber Rogers is a 76 y.o. female with medical history significant for hypertension on ACE/thiazide diuretic combination, CAD with non-STEMI September 2016 on Brilinta, with right is on chronic prednisone, asthma/question COPD and former tobacco abuse and stage III chronic kidney disease. Patient developed cough and congestion beginning this past Friday. She felt poorly enough she did not attend church on Sunday which is atypical for her. She has not had any nausea or vomiting and she has had low-grade fevers up to 100.4 for the past 2 days. Her family family convinced her to see her PCP today and she was found to be quite orthostatic on evaluation so was sent to the ER for further evaluation and treatment. She did not receive an influenza vaccine this  year."  Hospital Course:   Summary of her active problems in the hospital is as following. Acute kidney injury on CKD (chronic kidney disease), stage III -Patient presents with generalized malaise in thesetting of cough and congestion later found to be influenza Apositive -Patient appears to be dehydrated with positive orthostatic vital signs -Etiology secondary to acute infectious process/influenza with concomitant use of ACE inhibitor and thiazide diuretic -Significantly improved with IVF. Discontinue lisinopril HCTZ combination, also reduce Norvasc to half. Encourage by mouth fluids at home.  Metabolic acidosis, normal anion gap (NAG) -Secondary to dehydration and acute kidney injury -Getting better, encourage oral hydration  Influenza A -no evidence of pneumonia on chest x-ray and not hypoxemic -Tamiflu 30 mg daily in thesetting of severe acute kidney injury with low GFR-give for total of 5 days -No indication to begin empiric antibiotic therapies at this point -Rapid taper for prednisone ordered  Hypertension -Given dehydration, AKI with orthostatic hypotension stopping preadmission  lisinopril-hydrochlorothiazide combo Also reducing the dose of Norvasc from 10 mg daily to 5 mg  Coronary artery disease due to lipid rich plaque -Continue aspirin, Brilintaand Lipitor  Arthritis - Continue chronic prednisone, temporarily increase the dose to 20 mg with a rapid taper and continue 5 mg after that.  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  Patient was ambulatory without any assistance. On the day of the discharge the patient's vitals  were stable, and no other acute medical condition were reported by patient. the patient was felt safe to be discharge at home with family.  Procedures and Results:  none    Consultations:  none  DISCHARGE MEDICATION: Discharge Medication List as of 09/29/2016  1:52 PM    START taking these medications   Details  albuterol (PROVENTIL HFA;VENTOLIN HFA) 108 (90 Base) MCG/ACT inhaler Inhale 2 puffs into the lungs every 6 (six) hours as needed for wheezing or shortness of breath., Starting Sat 09/29/2016, Normal    benzonatate (TESSALON) 100 MG capsule Take 1 capsule (100 mg total) by mouth 3 (three) times daily as needed for cough., Starting Sat 09/29/2016, Normal    mometasone-formoterol (DULERA) 200-5 MCG/ACT AERO Inhale 2 puffs into the lungs 2 (two) times daily., Starting Sat 09/29/2016, Normal    oseltamivir (TAMIFLU) 30 MG capsule Take 1 capsule (30 mg total) by mouth daily., Starting Sun 09/30/2016, Until Tue 10/02/2016, Normal      CONTINUE these medications which have CHANGED   Details  amLODipine (NORVASC) 10 MG tablet Take 0.5 tablets (5 mg total) by mouth daily after supper., Starting Sat 09/29/2016, Normal    predniSONE (DELTASONE) 5 MG tablet Take 20mg  daily for 3days,Take 10mg  daily for 3days, then start taking 5mg  daily., Normal      CONTINUE these medications which have NOT CHANGED   Details  aspirin 81 MG chewable tablet Chew 1 tablet (81 mg total) by mouth daily., Starting 04/16/2015, Until Discontinued, No Print    atorvastatin (LIPITOR) 80 MG tablet Take 1 tablet (80 mg total) by mouth daily at 6 PM., Starting 04/16/2015, Until Discontinued, Normal    BRILINTA 90 MG TABS tablet TAKE ONE TABLET BY MOUTH TWICE A DAY, Normal    nitroGLYCERIN (NITROSTAT) 0.4 MG SL tablet Place 1 tablet (0.4 mg total) under the tongue every 5 (five) minutes x 3 doses as needed for chest pain., Starting 04/16/2015, Until Discontinued, Normal      STOP taking these medications     Fluticasone-Salmeterol (ADVAIR) 250-50 MCG/DOSE AEPB      lisinopril-hydrochlorothiazide (PRINZIDE,ZESTORETIC) 20-25 MG per tablet        No Known Allergies Discharge  Instructions    Diet - low sodium heart healthy    Complete by:  As directed    Discharge instructions    Complete by:  As directed    It is important that you read following instructions as well as go over your medication list with RN to help you understand your care after this hospitalization.  Discharge Instructions: Please follow-up with PCP in one week  Please request your primary care physician to go over all Hospital Tests and Procedure/Radiological results at the follow up,  Please get all Hospital records sent to your PCP by signing hospital release before you go home.   Do not take more than prescribed Pain, Sleep and Anxiety Medications. You were cared for by a hospitalist during your hospital stay. If you have any questions about your discharge medications or the care you received while you were in the hospital after you are discharged, you can call the unit and ask to speak with the hospitalist on call if the hospitalist that took care of you is not available.  Once you are discharged, your primary care physician will handle any further medical issues. Please note that NO REFILLS for any discharge medications will be authorized once you are discharged, as it is imperative that you return to your primary care  physician (or establish a relationship with a primary care physician if you do not have one) for your aftercare needs so that they can reassess your need for medications and monitor your lab values. You Must read complete instructions/literature along with all the possible adverse reactions/side effects for all the Medicines you take and that have been prescribed to you. Take any new Medicines after you have completely understood and accept all the possible adverse reactions/side effects. Wear Seat belts while driving. If you have smoked or chewed Tobacco in the last 2 yrs please stop smoking and/or stop any Recreational drug use.   Encourage fluids    Complete by:  As directed     Increase activity slowly    Complete by:  As directed      Discharge Exam: Filed Weights   09/27/16 1547  Weight: 65.1 kg (143 lb 8 oz)   Vitals:   09/29/16 1241 09/29/16 1320  BP:  (!) 154/73  Pulse: 80 68  Resp:  16  Temp:  98.3 F (36.8 C)   General: Appear in no distress, no Rash; Oral Mucosa moist. Cardiovascular: S1 and S2 Present, no Murmur, no JVD Respiratory: Bilateral Air entry present and Clear to Auscultation, no Crackles, no wheezes Abdomen: Bowel Sound present, Soft and no tenderness Extremities: no Pedal edema, no calf tenderness Neurology: Grossly no focal neuro deficit.  The results of significant diagnostics from this hospitalization (including imaging, microbiology, ancillary and laboratory) are listed below for reference.    Significant Diagnostic Studies: X-ray Chest Pa And Lateral  Result Date: 09/28/2016 CLINICAL DATA:  Recent influenza.  Hypertension. EXAM: CHEST  2 VIEW COMPARISON:  September 27, 2016 FINDINGS: There is mild atelectatic change in the right base. Lungs elsewhere are clear. Heart is upper normal in size with pulmonary vascularity within normal limits. No adenopathy. No bone lesions. IMPRESSION: Mild right base atelectasis. No new opacity. No consolidation or edema. Stable cardiac silhouette. Electronically Signed   By: Lowella Grip III M.D.   On: 09/28/2016 07:28   Dg Chest 2 View  Result Date: 09/27/2016 CLINICAL DATA:  Weakness and cough for 5 days. EXAM: CHEST  2 VIEW COMPARISON:  06/18/2015 FINDINGS: The cardiac silhouette, mediastinal an hilar contours are within normal limits and stable. The lungs are clear of acute process. No pleural effusion. The bony thorax is intact. IMPRESSION: No acute cardiopulmonary findings. Electronically Signed   By: Marijo Sanes M.D.   On: 09/27/2016 11:50    Microbiology: No results found for this or any previous visit (from the past 240 hour(s)).   Labs: CBC:  Recent Labs Lab  09/27/16 1200 09/28/16 0526 09/29/16 0439  WBC 6.6 4.4 6.0  NEUTROABS 4.8  --   --   HGB 11.0* 9.4* 8.8*  HCT 34.5* 29.7* 27.6*  MCV 85.0 85.3 85.4  PLT 340 305 [id number]   Basic Metabolic Panel:  Recent Labs Lab 09/27/16 1200 09/28/16 0526 09/29/16 0439  NA 139 139 145  K 4.2 3.9 4.6  CL 107 113* 118*  CO2 18* 17* 21*  GLUCOSE 111* 101* 105*  BUN 83* 64* 41*  CREATININE 4.54* 2.52* 1.70*  CALCIUM 8.2* 7.9* 8.1*   Time spent: 30 minutes  Signed:  Aurther Harlin  [facility] 09/29/2016 , 5:14 PM

## 2016-10-08 ENCOUNTER — Telehealth: Payer: Self-pay | Admitting: Internal Medicine

## 2016-10-08 DIAGNOSIS — N179 Acute kidney failure, unspecified: Secondary | ICD-10-CM | POA: Diagnosis not present

## 2016-10-08 DIAGNOSIS — I129 Hypertensive chronic kidney disease with stage 1 through stage 4 chronic kidney disease, or unspecified chronic kidney disease: Secondary | ICD-10-CM | POA: Diagnosis not present

## 2016-10-08 DIAGNOSIS — J45909 Unspecified asthma, uncomplicated: Secondary | ICD-10-CM | POA: Diagnosis not present

## 2016-10-08 DIAGNOSIS — D692 Other nonthrombocytopenic purpura: Secondary | ICD-10-CM | POA: Diagnosis not present

## 2016-10-08 DIAGNOSIS — N183 Chronic kidney disease, stage 3 (moderate): Secondary | ICD-10-CM | POA: Diagnosis not present

## 2016-10-08 DIAGNOSIS — J111 Influenza due to unidentified influenza virus with other respiratory manifestations: Secondary | ICD-10-CM | POA: Diagnosis not present

## 2016-10-08 DIAGNOSIS — I2510 Atherosclerotic heart disease of native coronary artery without angina pectoris: Secondary | ICD-10-CM | POA: Diagnosis not present

## 2016-10-08 DIAGNOSIS — I208 Other forms of angina pectoris: Secondary | ICD-10-CM | POA: Diagnosis not present

## 2016-10-08 DIAGNOSIS — E860 Dehydration: Secondary | ICD-10-CM | POA: Diagnosis not present

## 2016-10-08 DIAGNOSIS — I959 Hypotension, unspecified: Secondary | ICD-10-CM | POA: Diagnosis not present

## 2016-10-08 DIAGNOSIS — E7800 Pure hypercholesterolemia, unspecified: Secondary | ICD-10-CM | POA: Diagnosis not present

## 2016-10-08 NOTE — Telephone Encounter (Signed)
Received records from [place] for appointment on 10/25/16 with Dr Debara Pickett.  Records put with Dr Lysbeth Penner schedule for 10/25/16. lp

## 2016-10-25 ENCOUNTER — Encounter: Payer: Self-pay | Admitting: Internal Medicine

## 2016-10-25 ENCOUNTER — Ambulatory Visit (INDEPENDENT_AMBULATORY_CARE_PROVIDER_SITE_OTHER): Payer: Medicare Other | Admitting: Internal Medicine

## 2016-10-25 VITALS — BP 158/80 | HR 80 | Ht 67.0 in | Wt 146.8 lb

## 2016-10-25 DIAGNOSIS — E782 Mixed hyperlipidemia: Secondary | ICD-10-CM

## 2016-10-25 DIAGNOSIS — I10 Essential (primary) hypertension: Secondary | ICD-10-CM | POA: Diagnosis not present

## 2016-10-25 DIAGNOSIS — N183 Chronic kidney disease, stage 3 unspecified: Secondary | ICD-10-CM

## 2016-10-25 DIAGNOSIS — I2510 Atherosclerotic heart disease of native coronary artery without angina pectoris: Secondary | ICD-10-CM

## 2016-10-25 DIAGNOSIS — I2583 Coronary atherosclerosis due to lipid rich plaque: Secondary | ICD-10-CM

## 2016-10-25 MED ORDER — TICAGRELOR 90 MG PO TABS: 90.0000 mg | ORAL_TABLET | Freq: Two times a day (BID) | ORAL | 11 refills | Status: DC

## 2016-10-25 MED ORDER — NITROGLYCERIN 0.4 MG SL SUBL: 0.4000 mg | SUBLINGUAL_TABLET | SUBLINGUAL | 3 refills | Status: DC | PRN

## 2016-10-25 NOTE — Patient Instructions (Signed)
Your physician wants you to follow-up in: ONE YEAR with Dr. Hilty. You will receive a reminder letter in the mail two months in advance. If you don't receive a letter, please call our office to schedule the follow-up appointment.  

## 2016-10-26 DIAGNOSIS — E782 Mixed hyperlipidemia: Secondary | ICD-10-CM | POA: Insufficient documentation

## 2016-10-26 NOTE — Progress Notes (Signed)
Patient ID: Amber Rogers, female   DOB: 1940/12/12, 76 y.o.   MRN: [id number]    Date:  10/26/2016   ID:  Amber Rogers, DOB [birth year], MRN [id number]  PCP:  Simona Huh, MD  Primary Cardiologist:  Gwenlyn Found   Chief Complaint  Patient presents with  . follow up - last visit 07/2015    no complaints other than BP has been elevated     History of Present Illness: Amber Rogers is a 77 y.o. female  76 year old female with a history of hypertension but no prior cardiac history who presented to [facility] on 04/13/15 with complaints of chest pain. She ruled in for NSTEMI with a peak troponin of 5.03. BMP also revealed acute renal insufficiency on arrival, with SCr at 1.47. Subsequently, both her HCTZ and lisinopril were held in anticipation for [facility].   She underwent a LHC by Dr. Gwenlyn Found 04/14/15 which revealed high grade distal RCA lesion (culprit), moderate to severe mRCA lesion and mild to moderate CAD in LAD and LCX. She underwent PCI + DES to both the mid and distal RCA. Medical therapy was elected for her residual CAD. She tolerated the procedure well and left the cath lab in stable condition. Her CP resolved and she was started on DAPT with ASA + Brilinta, along with a statin. She could not tolerated BB therapy due to bradycardia. A 2D echo was obtained which showed normal LVF with an EF of 50-55%.  Durring post recovery, the patient noted change in baseline status/ confusion and headache. This was in the setting of hypertension with SBP in the low 200s. Rapid response was called to beside and Code stroke was activated. Neurology was consulted. A STAT CT of the head was obtained but was negative. A MRI was recommended, however, due to claustrophobia, the patient was unable to complete the study. Neurology recommended a f/u CT the next day. This was also negative for infarct. Neurology felt that her encephalopathy was likely secondary to her HTN. They recommended BP control and continuation of DAPT.  The  patients symptoms resolved spontaneously. She was continued on amlodipine for BP. Given improvement in her renal function, post cath, her home doses of lisinopril and HCTZ were resumed. Bilateral carotid dopplers were also preformed prior to discharge. She was seen by neurology prior to discharge and she was felt to be stable from a neurologic standpoint.   Patient presents for posthospital evaluation. She reports doing well. Her neurological symptoms of completely resolved. She's had no chest pain. She's taken her Brilinta as prescribed. She also denies nausea, vomiting, fever, chest pain, shortness of breath, orthopnea, dizziness, PND, cough, congestion, abdominal pain, hematochezia, melena, lower extremity edema, claudication.  She is no longer smoking.  She periodically has some heaviness in her legs when she walks however this is not all the time.  Amber Rogers returns today for follow-up. She denies any further chest pain or worsening shortness of breath. She's had some bruising although she is on chronic steroids and aspirin and Brilinta. She has no worsening shortness of breath. She says in fact she's used her inhaler a lot less now that she is stop smoking. She seems to be compliant with this. Blood pressure is mildly elevated today however recheck was 140/70. She did not take her blood pressure medicine this morning. She does have a follow-up appointment with her primary care provider in January which time she'll get her cholesterol rechecked.  10/26/2016  Amber Rogers returns today for follow-up.  She was referred back by her primary care provider. She apparently ran out of her atorvastatin and her PCP recently restarted it. She just had lab work which showed her LDL-C was not at goal. Her number was 96 and total cholesterol 193. I suspect that with restarting her medication she would be at goal without any medication changes. Blood pressure has been consistently running a little bit higher.  Hydrochlorothiazide lisinopril tablet action was stopped, possibly related to hypertensive nephropathy. Blood pressure today though is elevated 158/80.  Wt Readings from Last 3 Encounters:  10/25/16 146 lb 12.8 oz (66.6 kg)  09/27/16 143 lb 8 oz (65.1 kg)  07/25/15 137 lb (62.1 kg)     Past Medical History:  Diagnosis Date  . Asthma   . CHF (congestive heart failure) ([place])   . Chronic kidney disease (CKD), active medical management without dialysis, stage 3 (moderate)   . COPD (chronic obstructive pulmonary disease) ([place])   . Coronary artery disease   . High cholesterol   . Hypertension   . Myocardial infarction 04/2015  . Osteoarthritis    "all over" (09/27/2016)  . Pneumonia 1996    Current Outpatient Prescriptions  Medication Sig Dispense Refill  . amLODipine (NORVASC) 10 MG tablet Take 10 mg by mouth daily.    Marland Kitchen aspirin 81 MG chewable tablet Chew 1 tablet (81 mg total) by mouth daily.    Marland Kitchen atorvastatin (LIPITOR) 80 MG tablet Take 1 tablet (80 mg total) by mouth daily at 6 PM. 30 tablet 5  . nitroGLYCERIN (NITROSTAT) 0.4 MG SL tablet Place 1 tablet (0.4 mg total) under the tongue every 5 (five) minutes x 3 doses as needed for chest pain. 25 tablet 3  . prednisoLONE 5 MG TABS tablet Take 5 mg by mouth daily.    . SYMBICORT 160-4.5 MCG/ACT inhaler Inhale 2 puffs into the lungs 2 (two) times daily.    . ticagrelor (BRILINTA) 90 MG TABS tablet Take 1 tablet (90 mg total) by mouth 2 (two) times daily. 60 tablet 11   No current facility-administered medications for this visit.     Allergies:   No Known Allergies  Social History:  The patient  reports that she quit smoking about 18 months ago. Her smoking use included Cigarettes. She has a 20.00 pack-year smoking history. She has never used smokeless tobacco. She reports that she does not drink alcohol or use drugs.   Family history:  No family history on file.  ROS:  Please see the history of present illness.  All other systems  reviewed and negative.   PHYSICAL EXAM: VS:  BP (!) 158/80   Pulse 80   Ht 5\' 7"  (1.702 m)   Wt 146 lb 12.8 oz (66.6 kg)   BMI 22.99 kg/m  Well nourished, well developed, in no acute distress  HEENT: Pupils are equal round react to light accommodation extraocular movements are intact.  Neck: no JVD No cervical lymphadenopathy. Cardiac: Regular rate and rhythm without murmurs rubs or gallops.  Lungs:  clear to auscultation bilaterally, no wheezing, rhonchi or rales  Abd: soft, nontender, positive bowel sounds all quadrants, no hepatosplenomegaly  Ext: no lower extremity edema.  2+ radial and dorsalis pedis pulses. Skin: warm and dry  Neuro:  Grossly normal  Lipid Panel     Component Value Date/Time   CHOL 244 (H) 04/14/2015 0500   TRIG 53 04/14/2015 0500   HDL 104 04/14/2015 0500   CHOLHDL 2.3 04/14/2015 0500   VLDL 11  04/14/2015 0500   LDLCALC 129 (H) 04/14/2015 0500    EKG:  Deferred  ASSESSMENT: 1. Coronary artery disease 2. Essential hypertension 3. Tobacco abuse  PLAN Status post drug-eluting stents to the mid and distal RCA. Continue aspirin, statin and Brilinta.  Interestingly the patient is a very good lipid panel with triglycerides of 53 and HDL of 104.  On Lipitor 80 mg daily, But stopped taking it for unknown reasons. She recently restarted and LDL is close to goal. No changes at this time. Patient reports her blood pressure is always elevated when she comes to the doctor's office. Recheck today showed the blood pressure come down. No changes in current medications.Patient has not smoked since her hospitalization. Encouraged her to continue this.  Follow-up annually or sooner as necessary.  Pixie Casino, MD, [facility] [address] Attending Cardiologist [place]

## 2016-11-23 ENCOUNTER — Other Ambulatory Visit: Payer: Self-pay | Admitting: Family Medicine

## 2016-11-23 DIAGNOSIS — I252 Old myocardial infarction: Secondary | ICD-10-CM | POA: Diagnosis not present

## 2016-11-23 DIAGNOSIS — M1990 Unspecified osteoarthritis, unspecified site: Secondary | ICD-10-CM | POA: Diagnosis not present

## 2016-11-23 DIAGNOSIS — D692 Other nonthrombocytopenic purpura: Secondary | ICD-10-CM | POA: Diagnosis not present

## 2016-11-23 DIAGNOSIS — Z1231 Encounter for screening mammogram for malignant neoplasm of breast: Secondary | ICD-10-CM

## 2016-11-23 DIAGNOSIS — I129 Hypertensive chronic kidney disease with stage 1 through stage 4 chronic kidney disease, or unspecified chronic kidney disease: Secondary | ICD-10-CM | POA: Diagnosis not present

## 2016-11-23 DIAGNOSIS — N183 Chronic kidney disease, stage 3 (moderate): Secondary | ICD-10-CM | POA: Diagnosis not present

## 2016-11-23 DIAGNOSIS — I208 Other forms of angina pectoris: Secondary | ICD-10-CM | POA: Diagnosis not present

## 2016-11-23 DIAGNOSIS — I2510 Atherosclerotic heart disease of native coronary artery without angina pectoris: Secondary | ICD-10-CM | POA: Diagnosis not present

## 2016-11-23 DIAGNOSIS — I779 Disorder of arteries and arterioles, unspecified: Secondary | ICD-10-CM | POA: Diagnosis not present

## 2016-11-23 DIAGNOSIS — E7800 Pure hypercholesterolemia, unspecified: Secondary | ICD-10-CM | POA: Diagnosis not present

## 2016-11-23 DIAGNOSIS — Z1239 Encounter for other screening for malignant neoplasm of breast: Secondary | ICD-10-CM | POA: Diagnosis not present

## 2016-11-23 DIAGNOSIS — J45909 Unspecified asthma, uncomplicated: Secondary | ICD-10-CM | POA: Diagnosis not present

## 2016-12-10 ENCOUNTER — Ambulatory Visit
Admission: RE | Admit: 2016-12-10 | Discharge: 2016-12-10 | Disposition: A | Discharge status: Home / Self Care | Payer: Medicare Other | Source: Ambulatory Visit | Attending: Family Medicine | Admitting: Family Medicine

## 2016-12-10 DIAGNOSIS — Z1231 Encounter for screening mammogram for malignant neoplasm of breast: Secondary | ICD-10-CM | POA: Diagnosis not present

## 2016-12-20 DIAGNOSIS — R7301 Impaired fasting glucose: Secondary | ICD-10-CM | POA: Diagnosis not present

## 2016-12-20 DIAGNOSIS — I129 Hypertensive chronic kidney disease with stage 1 through stage 4 chronic kidney disease, or unspecified chronic kidney disease: Secondary | ICD-10-CM | POA: Diagnosis not present

## 2017-01-31 DIAGNOSIS — I129 Hypertensive chronic kidney disease with stage 1 through stage 4 chronic kidney disease, or unspecified chronic kidney disease: Secondary | ICD-10-CM | POA: Diagnosis not present

## 2017-01-31 DIAGNOSIS — N183 Chronic kidney disease, stage 3 (moderate): Secondary | ICD-10-CM | POA: Diagnosis not present

## 2017-04-30 DIAGNOSIS — D485 Neoplasm of uncertain behavior of skin: Secondary | ICD-10-CM | POA: Diagnosis not present

## 2017-04-30 DIAGNOSIS — C44319 Basal cell carcinoma of skin of other parts of face: Secondary | ICD-10-CM | POA: Diagnosis not present

## 2017-06-24 DIAGNOSIS — C44319 Basal cell carcinoma of skin of other parts of face: Secondary | ICD-10-CM | POA: Diagnosis not present

## 2017-08-22 ENCOUNTER — Other Ambulatory Visit: Payer: Self-pay | Admitting: Family Medicine

## 2017-08-22 DIAGNOSIS — I779 Disorder of arteries and arterioles, unspecified: Secondary | ICD-10-CM | POA: Diagnosis not present

## 2017-08-22 DIAGNOSIS — N183 Chronic kidney disease, stage 3 (moderate): Secondary | ICD-10-CM | POA: Diagnosis not present

## 2017-08-22 DIAGNOSIS — I129 Hypertensive chronic kidney disease with stage 1 through stage 4 chronic kidney disease, or unspecified chronic kidney disease: Secondary | ICD-10-CM | POA: Diagnosis not present

## 2017-08-22 DIAGNOSIS — Z1211 Encounter for screening for malignant neoplasm of colon: Secondary | ICD-10-CM | POA: Diagnosis not present

## 2017-08-22 DIAGNOSIS — Z23 Encounter for immunization: Secondary | ICD-10-CM | POA: Diagnosis not present

## 2017-08-22 DIAGNOSIS — E7800 Pure hypercholesterolemia, unspecified: Secondary | ICD-10-CM | POA: Diagnosis not present

## 2017-08-22 DIAGNOSIS — J45909 Unspecified asthma, uncomplicated: Secondary | ICD-10-CM | POA: Diagnosis not present

## 2017-08-22 DIAGNOSIS — I2510 Atherosclerotic heart disease of native coronary artery without angina pectoris: Secondary | ICD-10-CM | POA: Diagnosis not present

## 2017-08-22 DIAGNOSIS — I739 Peripheral vascular disease, unspecified: Principal | ICD-10-CM

## 2017-08-22 DIAGNOSIS — I208 Other forms of angina pectoris: Secondary | ICD-10-CM | POA: Diagnosis not present

## 2017-08-22 DIAGNOSIS — M1990 Unspecified osteoarthritis, unspecified site: Secondary | ICD-10-CM | POA: Diagnosis not present

## 2017-08-28 ENCOUNTER — Ambulatory Visit
Admission: RE | Admit: 2017-08-28 | Discharge: 2017-08-28 | Disposition: A | Discharge status: Home / Self Care | Payer: Medicare Other | Source: Ambulatory Visit | Attending: Family Medicine | Admitting: Family Medicine

## 2017-08-28 DIAGNOSIS — I739 Peripheral vascular disease, unspecified: Principal | ICD-10-CM

## 2017-08-28 DIAGNOSIS — I779 Disorder of arteries and arterioles, unspecified: Secondary | ICD-10-CM

## 2017-08-28 DIAGNOSIS — I6523 Occlusion and stenosis of bilateral carotid arteries: Secondary | ICD-10-CM | POA: Diagnosis not present

## 2017-09-30 DIAGNOSIS — L905 Scar conditions and fibrosis of skin: Secondary | ICD-10-CM | POA: Diagnosis not present

## 2017-09-30 DIAGNOSIS — C44319 Basal cell carcinoma of skin of other parts of face: Secondary | ICD-10-CM | POA: Diagnosis not present

## 2017-10-03 DIAGNOSIS — L905 Scar conditions and fibrosis of skin: Secondary | ICD-10-CM | POA: Diagnosis not present

## 2018-02-21 DIAGNOSIS — Z0000 Encounter for general adult medical examination without abnormal findings: Secondary | ICD-10-CM | POA: Diagnosis not present

## 2018-02-21 DIAGNOSIS — N183 Chronic kidney disease, stage 3 (moderate): Secondary | ICD-10-CM | POA: Diagnosis not present

## 2018-02-21 DIAGNOSIS — D692 Other nonthrombocytopenic purpura: Secondary | ICD-10-CM | POA: Diagnosis not present

## 2018-02-21 DIAGNOSIS — R7301 Impaired fasting glucose: Secondary | ICD-10-CM | POA: Diagnosis not present

## 2018-02-21 DIAGNOSIS — J45909 Unspecified asthma, uncomplicated: Secondary | ICD-10-CM | POA: Diagnosis not present

## 2018-02-21 DIAGNOSIS — I208 Other forms of angina pectoris: Secondary | ICD-10-CM | POA: Diagnosis not present

## 2018-02-21 DIAGNOSIS — E7800 Pure hypercholesterolemia, unspecified: Secondary | ICD-10-CM | POA: Diagnosis not present

## 2018-02-21 DIAGNOSIS — M1990 Unspecified osteoarthritis, unspecified site: Secondary | ICD-10-CM | POA: Diagnosis not present

## 2018-02-21 DIAGNOSIS — I779 Disorder of arteries and arterioles, unspecified: Secondary | ICD-10-CM | POA: Diagnosis not present

## 2018-02-21 DIAGNOSIS — I2510 Atherosclerotic heart disease of native coronary artery without angina pectoris: Secondary | ICD-10-CM | POA: Diagnosis not present

## 2018-02-21 DIAGNOSIS — Z1239 Encounter for other screening for malignant neoplasm of breast: Secondary | ICD-10-CM | POA: Diagnosis not present

## 2018-02-21 DIAGNOSIS — I129 Hypertensive chronic kidney disease with stage 1 through stage 4 chronic kidney disease, or unspecified chronic kidney disease: Secondary | ICD-10-CM | POA: Diagnosis not present

## 2018-03-27 ENCOUNTER — Emergency Department (HOSPITAL_COMMUNITY): Payer: Medicare Other

## 2018-03-27 ENCOUNTER — Inpatient Hospital Stay (HOSPITAL_COMMUNITY)
Admission: EM | Admit: 2018-03-27 | Discharge: 2018-04-24 | DRG: 003 | Disposition: A | Discharge status: Hospice | Payer: Medicare Other | Attending: Neurology | Admitting: Neurology

## 2018-03-27 ENCOUNTER — Inpatient Hospital Stay (HOSPITAL_COMMUNITY): Payer: Medicare Other

## 2018-03-27 ENCOUNTER — Encounter (HOSPITAL_COMMUNITY): Payer: Self-pay

## 2018-03-27 DIAGNOSIS — I615 Nontraumatic intracerebral hemorrhage, intraventricular: Secondary | ICD-10-CM | POA: Diagnosis present

## 2018-03-27 DIAGNOSIS — Z515 Encounter for palliative care: Secondary | ICD-10-CM | POA: Diagnosis not present

## 2018-03-27 DIAGNOSIS — R110 Nausea: Secondary | ICD-10-CM | POA: Diagnosis not present

## 2018-03-27 DIAGNOSIS — R0602 Shortness of breath: Secondary | ICD-10-CM

## 2018-03-27 DIAGNOSIS — G253 Myoclonus: Secondary | ICD-10-CM | POA: Diagnosis not present

## 2018-03-27 DIAGNOSIS — N183 Chronic kidney disease, stage 3 (moderate): Secondary | ICD-10-CM | POA: Diagnosis present

## 2018-03-27 DIAGNOSIS — I130 Hypertensive heart and chronic kidney disease with heart failure and stage 1 through stage 4 chronic kidney disease, or unspecified chronic kidney disease: Secondary | ICD-10-CM | POA: Diagnosis present

## 2018-03-27 DIAGNOSIS — Z7982 Long term (current) use of aspirin: Secondary | ICD-10-CM

## 2018-03-27 DIAGNOSIS — J9811 Atelectasis: Secondary | ICD-10-CM | POA: Diagnosis present

## 2018-03-27 DIAGNOSIS — I10 Essential (primary) hypertension: Secondary | ICD-10-CM | POA: Diagnosis not present

## 2018-03-27 DIAGNOSIS — J9600 Acute respiratory failure, unspecified whether with hypoxia or hypercapnia: Secondary | ICD-10-CM | POA: Diagnosis not present

## 2018-03-27 DIAGNOSIS — Z9289 Personal history of other medical treatment: Secondary | ICD-10-CM

## 2018-03-27 DIAGNOSIS — Z4682 Encounter for fitting and adjustment of non-vascular catheter: Secondary | ICD-10-CM | POA: Diagnosis not present

## 2018-03-27 DIAGNOSIS — T859XXA Unspecified complication of internal prosthetic device, implant and graft, initial encounter: Secondary | ICD-10-CM | POA: Diagnosis not present

## 2018-03-27 DIAGNOSIS — I161 Hypertensive emergency: Secondary | ICD-10-CM | POA: Diagnosis not present

## 2018-03-27 DIAGNOSIS — Z01818 Encounter for other preprocedural examination: Secondary | ICD-10-CM

## 2018-03-27 DIAGNOSIS — Z66 Do not resuscitate: Secondary | ICD-10-CM | POA: Diagnosis not present

## 2018-03-27 DIAGNOSIS — R001 Bradycardia, unspecified: Secondary | ICD-10-CM | POA: Diagnosis not present

## 2018-03-27 DIAGNOSIS — G9389 Other specified disorders of brain: Secondary | ICD-10-CM | POA: Diagnosis present

## 2018-03-27 DIAGNOSIS — J449 Chronic obstructive pulmonary disease, unspecified: Secondary | ICD-10-CM | POA: Diagnosis not present

## 2018-03-27 DIAGNOSIS — R29705 NIHSS score 5: Secondary | ICD-10-CM | POA: Diagnosis present

## 2018-03-27 DIAGNOSIS — Z9911 Dependence on respirator [ventilator] status: Secondary | ICD-10-CM | POA: Diagnosis not present

## 2018-03-27 DIAGNOSIS — E878 Other disorders of electrolyte and fluid balance, not elsewhere classified: Secondary | ICD-10-CM | POA: Diagnosis not present

## 2018-03-27 DIAGNOSIS — J9601 Acute respiratory failure with hypoxia: Secondary | ICD-10-CM

## 2018-03-27 DIAGNOSIS — T45526A Underdosing of antithrombotic drugs, initial encounter: Secondary | ICD-10-CM | POA: Diagnosis present

## 2018-03-27 DIAGNOSIS — G936 Cerebral edema: Secondary | ICD-10-CM | POA: Diagnosis present

## 2018-03-27 DIAGNOSIS — R1111 Vomiting without nausea: Secondary | ICD-10-CM | POA: Diagnosis not present

## 2018-03-27 DIAGNOSIS — Z7902 Long term (current) use of antithrombotics/antiplatelets: Secondary | ICD-10-CM

## 2018-03-27 DIAGNOSIS — G9349 Other encephalopathy: Secondary | ICD-10-CM | POA: Diagnosis present

## 2018-03-27 DIAGNOSIS — R509 Fever, unspecified: Secondary | ICD-10-CM | POA: Diagnosis not present

## 2018-03-27 DIAGNOSIS — E785 Hyperlipidemia, unspecified: Secondary | ICD-10-CM | POA: Diagnosis not present

## 2018-03-27 DIAGNOSIS — G911 Obstructive hydrocephalus: Secondary | ICD-10-CM | POA: Diagnosis present

## 2018-03-27 DIAGNOSIS — Z430 Encounter for attention to tracheostomy: Secondary | ICD-10-CM

## 2018-03-27 DIAGNOSIS — I629 Nontraumatic intracranial hemorrhage, unspecified: Secondary | ICD-10-CM | POA: Diagnosis not present

## 2018-03-27 DIAGNOSIS — G459 Transient cerebral ischemic attack, unspecified: Secondary | ICD-10-CM | POA: Diagnosis not present

## 2018-03-27 DIAGNOSIS — J690 Pneumonitis due to inhalation of food and vomit: Secondary | ICD-10-CM | POA: Diagnosis not present

## 2018-03-27 DIAGNOSIS — J40 Bronchitis, not specified as acute or chronic: Secondary | ICD-10-CM

## 2018-03-27 DIAGNOSIS — E876 Hypokalemia: Secondary | ICD-10-CM

## 2018-03-27 DIAGNOSIS — Z7951 Long term (current) use of inhaled steroids: Secondary | ICD-10-CM

## 2018-03-27 DIAGNOSIS — I614 Nontraumatic intracerebral hemorrhage in cerebellum: Secondary | ICD-10-CM | POA: Diagnosis not present

## 2018-03-27 DIAGNOSIS — E782 Mixed hyperlipidemia: Secondary | ICD-10-CM | POA: Diagnosis present

## 2018-03-27 DIAGNOSIS — I509 Heart failure, unspecified: Secondary | ICD-10-CM | POA: Diagnosis not present

## 2018-03-27 DIAGNOSIS — Z955 Presence of coronary angioplasty implant and graft: Secondary | ICD-10-CM

## 2018-03-27 DIAGNOSIS — R918 Other nonspecific abnormal finding of lung field: Secondary | ICD-10-CM | POA: Diagnosis not present

## 2018-03-27 DIAGNOSIS — I5032 Chronic diastolic (congestive) heart failure: Secondary | ICD-10-CM | POA: Diagnosis present

## 2018-03-27 DIAGNOSIS — J9690 Respiratory failure, unspecified, unspecified whether with hypoxia or hypercapnia: Secondary | ICD-10-CM | POA: Diagnosis not present

## 2018-03-27 DIAGNOSIS — M2550 Pain in unspecified joint: Secondary | ICD-10-CM | POA: Diagnosis not present

## 2018-03-27 DIAGNOSIS — Z7189 Other specified counseling: Secondary | ICD-10-CM | POA: Diagnosis not present

## 2018-03-27 DIAGNOSIS — E875 Hyperkalemia: Secondary | ICD-10-CM | POA: Diagnosis not present

## 2018-03-27 DIAGNOSIS — N189 Chronic kidney disease, unspecified: Secondary | ICD-10-CM | POA: Diagnosis not present

## 2018-03-27 DIAGNOSIS — Y92009 Unspecified place in unspecified non-institutional (private) residence as the place of occurrence of the external cause: Secondary | ICD-10-CM | POA: Diagnosis not present

## 2018-03-27 DIAGNOSIS — R1110 Vomiting, unspecified: Secondary | ICD-10-CM

## 2018-03-27 DIAGNOSIS — Z8701 Personal history of pneumonia (recurrent): Secondary | ICD-10-CM

## 2018-03-27 DIAGNOSIS — R112 Nausea with vomiting, unspecified: Secondary | ICD-10-CM | POA: Diagnosis not present

## 2018-03-27 DIAGNOSIS — I619 Nontraumatic intracerebral hemorrhage, unspecified: Secondary | ICD-10-CM | POA: Diagnosis present

## 2018-03-27 DIAGNOSIS — D72829 Elevated white blood cell count, unspecified: Secondary | ICD-10-CM | POA: Diagnosis not present

## 2018-03-27 DIAGNOSIS — J15211 Pneumonia due to Methicillin susceptible Staphylococcus aureus: Secondary | ICD-10-CM | POA: Diagnosis not present

## 2018-03-27 DIAGNOSIS — R1310 Dysphagia, unspecified: Secondary | ICD-10-CM | POA: Diagnosis present

## 2018-03-27 DIAGNOSIS — Y95 Nosocomial condition: Secondary | ICD-10-CM | POA: Diagnosis not present

## 2018-03-27 DIAGNOSIS — G9340 Encephalopathy, unspecified: Secondary | ICD-10-CM | POA: Diagnosis not present

## 2018-03-27 DIAGNOSIS — Z7401 Bed confinement status: Secondary | ICD-10-CM | POA: Diagnosis not present

## 2018-03-27 DIAGNOSIS — I69391 Dysphagia following cerebral infarction: Secondary | ICD-10-CM | POA: Diagnosis not present

## 2018-03-27 DIAGNOSIS — Z978 Presence of other specified devices: Secondary | ICD-10-CM

## 2018-03-27 DIAGNOSIS — Z96641 Presence of right artificial hip joint: Secondary | ICD-10-CM | POA: Diagnosis present

## 2018-03-27 DIAGNOSIS — J189 Pneumonia, unspecified organism: Secondary | ICD-10-CM | POA: Diagnosis not present

## 2018-03-27 DIAGNOSIS — J440 Chronic obstructive pulmonary disease with acute lower respiratory infection: Secondary | ICD-10-CM | POA: Diagnosis not present

## 2018-03-27 DIAGNOSIS — R839 Unspecified abnormal finding in cerebrospinal fluid: Secondary | ICD-10-CM | POA: Diagnosis not present

## 2018-03-27 DIAGNOSIS — R7989 Other specified abnormal findings of blood chemistry: Secondary | ICD-10-CM | POA: Diagnosis not present

## 2018-03-27 DIAGNOSIS — Z7952 Long term (current) use of systemic steroids: Secondary | ICD-10-CM

## 2018-03-27 DIAGNOSIS — J181 Lobar pneumonia, unspecified organism: Secondary | ICD-10-CM | POA: Diagnosis not present

## 2018-03-27 DIAGNOSIS — I169 Hypertensive crisis, unspecified: Secondary | ICD-10-CM | POA: Diagnosis present

## 2018-03-27 DIAGNOSIS — Z930 Tracheostomy status: Secondary | ICD-10-CM | POA: Diagnosis not present

## 2018-03-27 DIAGNOSIS — R42 Dizziness and giddiness: Secondary | ICD-10-CM | POA: Diagnosis not present

## 2018-03-27 DIAGNOSIS — R9431 Abnormal electrocardiogram [ECG] [EKG]: Secondary | ICD-10-CM | POA: Diagnosis not present

## 2018-03-27 DIAGNOSIS — R4182 Altered mental status, unspecified: Secondary | ICD-10-CM | POA: Diagnosis not present

## 2018-03-27 DIAGNOSIS — I350 Nonrheumatic aortic (valve) stenosis: Secondary | ICD-10-CM | POA: Diagnosis not present

## 2018-03-27 DIAGNOSIS — I2510 Atherosclerotic heart disease of native coronary artery without angina pectoris: Secondary | ICD-10-CM | POA: Diagnosis present

## 2018-03-27 DIAGNOSIS — I252 Old myocardial infarction: Secondary | ICD-10-CM

## 2018-03-27 DIAGNOSIS — E872 Acidosis: Secondary | ICD-10-CM | POA: Diagnosis not present

## 2018-03-27 DIAGNOSIS — Z9114 Patient's other noncompliance with medication regimen: Secondary | ICD-10-CM

## 2018-03-27 DIAGNOSIS — J9621 Acute and chronic respiratory failure with hypoxia: Secondary | ICD-10-CM | POA: Diagnosis present

## 2018-03-27 DIAGNOSIS — J90 Pleural effusion, not elsewhere classified: Secondary | ICD-10-CM | POA: Diagnosis not present

## 2018-03-27 DIAGNOSIS — R339 Retention of urine, unspecified: Secondary | ICD-10-CM | POA: Diagnosis not present

## 2018-03-27 DIAGNOSIS — D62 Acute posthemorrhagic anemia: Secondary | ICD-10-CM | POA: Diagnosis not present

## 2018-03-27 DIAGNOSIS — R5081 Fever presenting with conditions classified elsewhere: Secondary | ICD-10-CM | POA: Diagnosis not present

## 2018-03-27 DIAGNOSIS — Z0189 Encounter for other specified special examinations: Secondary | ICD-10-CM

## 2018-03-27 DIAGNOSIS — R4020 Unspecified coma: Secondary | ICD-10-CM | POA: Diagnosis not present

## 2018-03-27 DIAGNOSIS — I361 Nonrheumatic tricuspid (valve) insufficiency: Secondary | ICD-10-CM | POA: Diagnosis not present

## 2018-03-27 DIAGNOSIS — G919 Hydrocephalus, unspecified: Secondary | ICD-10-CM

## 2018-03-27 DIAGNOSIS — N179 Acute kidney failure, unspecified: Secondary | ICD-10-CM | POA: Diagnosis present

## 2018-03-27 DIAGNOSIS — J9503 Malfunction of tracheostomy stoma: Secondary | ICD-10-CM | POA: Diagnosis not present

## 2018-03-27 DIAGNOSIS — J9809 Other diseases of bronchus, not elsewhere classified: Secondary | ICD-10-CM | POA: Diagnosis not present

## 2018-03-27 DIAGNOSIS — T39016A Underdosing of aspirin, initial encounter: Secondary | ICD-10-CM | POA: Diagnosis present

## 2018-03-27 DIAGNOSIS — Z79899 Other long term (current) drug therapy: Secondary | ICD-10-CM

## 2018-03-27 DIAGNOSIS — R404 Transient alteration of awareness: Secondary | ICD-10-CM | POA: Diagnosis not present

## 2018-03-27 DIAGNOSIS — M1990 Unspecified osteoarthritis, unspecified site: Secondary | ICD-10-CM | POA: Diagnosis present

## 2018-03-27 DIAGNOSIS — Z91128 Patient's intentional underdosing of medication regimen for other reason: Secondary | ICD-10-CM

## 2018-03-27 LAB — URINALYSIS, ROUTINE W REFLEX MICROSCOPIC
BILIRUBIN URINE: NEGATIVE
Bacteria, UA: NONE SEEN
Glucose, UA: 50 mg/dL — AB
Hgb urine dipstick: NEGATIVE
Ketones, ur: 5 mg/dL — AB
Leukocytes, UA: NEGATIVE
NITRITE: NEGATIVE
PH: 7 (ref 5.0–8.0)
Protein, ur: 30 mg/dL — AB
Specific Gravity, Urine: 1.012 (ref 1.005–1.030)

## 2018-03-27 LAB — PREPARE PLATELET PHERESIS
UNIT DIVISION: 0
Unit division: 0

## 2018-03-27 LAB — CBC WITH DIFFERENTIAL/PLATELET
ABS IMMATURE GRANULOCYTES: 0.1 10*3/uL (ref 0.0–0.1)
BASOS ABS: 0.1 10*3/uL (ref 0.0–0.1)
Basophils Relative: 1 %
Eosinophils Absolute: 0.2 10*3/uL (ref 0.0–0.7)
Eosinophils Relative: 1 %
HCT: 37.5 % (ref 36.0–46.0)
HEMOGLOBIN: 11.6 g/dL — AB (ref 12.0–15.0)
Immature Granulocytes: 1 %
LYMPHS PCT: 10 %
Lymphs Abs: 1.5 10*3/uL (ref 0.7–4.0)
MCH: 26.7 pg (ref 26.0–34.0)
MCHC: 30.9 g/dL (ref 30.0–36.0)
MCV: 86.4 fL (ref 78.0–100.0)
Monocytes Absolute: 0.9 10*3/uL (ref 0.1–1.0)
Monocytes Relative: 6 %
NEUTROS ABS: 12.2 10*3/uL — AB (ref 1.7–7.7)
Neutrophils Relative %: 81 %
Platelets: 308 10*3/uL (ref 150–400)
RBC: 4.34 MIL/uL (ref 3.87–5.11)
RDW: 15.4 % (ref 11.5–15.5)
WBC: 14.9 10*3/uL — AB (ref 4.0–10.5)

## 2018-03-27 LAB — BPAM PLATELET PHERESIS
BLOOD PRODUCT EXPIRATION DATE: 201908232359
Blood Product Expiration Date: 201908242359
ISSUE DATE / TIME: 201908222157
ISSUE DATE / TIME: 201908222157
UNIT TYPE AND RH: 6200
Unit Type and Rh: 6200

## 2018-03-27 LAB — TRIGLYCERIDES: Triglycerides: 53 mg/dL (ref <150)

## 2018-03-27 LAB — COMPREHENSIVE METABOLIC PANEL
ALBUMIN: 3.4 g/dL — AB (ref 3.5–5.0)
ALT: 16 U/L (ref 0–44)
ANION GAP: 10 (ref 5–15)
AST: 25 U/L (ref 15–41)
Alkaline Phosphatase: 82 U/L (ref 38–126)
BUN: 22 mg/dL (ref 8–23)
CHLORIDE: 105 mmol/L (ref 98–111)
CO2: 23 mmol/L (ref 22–32)
Calcium: 8.8 mg/dL — ABNORMAL LOW (ref 8.9–10.3)
Creatinine, Ser: 1.36 mg/dL — ABNORMAL HIGH (ref 0.44–1.00)
GFR calc non Af Amer: 37 mL/min — ABNORMAL LOW (ref >60)
GFR, EST AFRICAN AMERICAN: 43 mL/min — AB (ref >60)
GLUCOSE: 178 mg/dL — AB (ref 70–99)
Potassium: 4.2 mmol/L (ref 3.5–5.1)
SODIUM: 138 mmol/L (ref 135–145)
Total Bilirubin: 1.2 mg/dL (ref 0.3–1.2)
Total Protein: 6.6 g/dL (ref 6.5–8.1)

## 2018-03-27 LAB — POCT I-STAT 3, ART BLOOD GAS (G3+)
BICARBONATE: 24.5 mmol/L (ref 20.0–28.0)
O2 Saturation: 100 %
PCO2 ART: 39.9 mmHg (ref 32.0–48.0)
PH ART: 7.396 (ref 7.350–7.450)
Patient temperature: 98.3
TCO2: 26 mmol/L (ref 22–32)
pO2, Arterial: 227 mmHg — ABNORMAL HIGH (ref 83.0–108.0)

## 2018-03-27 LAB — PROTIME-INR
INR: 1.06
PROTHROMBIN TIME: 13.8 s (ref 11.4–15.2)

## 2018-03-27 LAB — BRAIN NATRIURETIC PEPTIDE: B NATRIURETIC PEPTIDE 5: 106.3 pg/mL — AB (ref 0.0–100.0)

## 2018-03-27 LAB — TROPONIN I

## 2018-03-27 LAB — PLATELET INHIBITION P2Y12: Platelet Function  P2Y12: 251 [PRU] (ref 194–418)

## 2018-03-27 LAB — ETHANOL: Alcohol, Ethyl (B): <10 mg/dL (ref <10)

## 2018-03-27 LAB — TYPE AND SCREEN
ABO/RH(D): O POS
Antibody Screen: NEGATIVE

## 2018-03-27 LAB — ABO/RH: ABO/RH(D): O POS

## 2018-03-27 MED ORDER — FENTANYL CITRATE (PF) 100 MCG/2ML IJ SOLN
50.0000 ug | Freq: Once | INTRAMUSCULAR | Status: AC
  Administered 2018-03-27: 50 ug via INTRAVENOUS
  Filled 2018-03-27: qty 2

## 2018-03-27 MED ORDER — PROPOFOL 1000 MG/100ML IV EMUL
INTRAVENOUS | Status: AC
  Filled 2018-03-27: qty 100

## 2018-03-27 MED ORDER — FENTANYL BOLUS VIA INFUSION
25.0000 ug | INTRAVENOUS | Status: DC | PRN
  Filled 2018-03-27: qty 25

## 2018-03-27 MED ORDER — IPRATROPIUM-ALBUTEROL 0.5-2.5 (3) MG/3ML IN SOLN
3.0000 mL | Freq: Four times a day (QID) | RESPIRATORY_TRACT | Status: DC
  Administered 2018-03-28 – 2018-04-09 (×52): 3 mL via RESPIRATORY_TRACT
  Filled 2018-03-27 (×52): qty 3

## 2018-03-27 MED ORDER — MIDAZOLAM HCL 2 MG/2ML IJ SOLN: 1.0000 mg | INTRAMUSCULAR | Status: DC | PRN

## 2018-03-27 MED ORDER — SENNOSIDES-DOCUSATE SODIUM 8.6-50 MG PO TABS: 1.0000 | ORAL_TABLET | Freq: Two times a day (BID) | ORAL | Status: DC

## 2018-03-27 MED ORDER — NICARDIPINE HCL IN NACL 20-0.86 MG/200ML-% IV SOLN
3.0000 mg/h | INTRAVENOUS | Status: DC
  Administered 2018-03-27: 3 mg/h via INTRAVENOUS
  Administered 2018-03-28: 7 mg/h via INTRAVENOUS
  Administered 2018-03-28 (×4): 5 mg/h via INTRAVENOUS
  Filled 2018-03-27 (×5): qty 200

## 2018-03-27 MED ORDER — IOPAMIDOL (ISOVUE-370) INJECTION 76%
INTRAVENOUS | Status: AC
  Filled 2018-03-27: qty 50

## 2018-03-27 MED ORDER — NICARDIPINE HCL IN NACL 20-0.86 MG/200ML-% IV SOLN
3.0000 mg/h | Freq: Once | INTRAVENOUS | Status: AC
  Administered 2018-03-27: 5 mg/h via INTRAVENOUS
  Filled 2018-03-27: qty 200

## 2018-03-27 MED ORDER — ACETAMINOPHEN 160 MG/5ML PO SOLN
650.0000 mg | ORAL | Status: DC | PRN
  Administered 2018-03-29 – 2018-04-22 (×30): 650 mg
  Filled 2018-03-27 (×32): qty 20.3

## 2018-03-27 MED ORDER — FENTANYL 2500MCG IN NS 250ML (10MCG/ML) PREMIX INFUSION
0.0000 ug/h | INTRAVENOUS | Status: DC
  Administered 2018-03-27 (×2): 50 ug/h via INTRAVENOUS
  Administered 2018-03-29: 100 ug/h via INTRAVENOUS
  Filled 2018-03-27 (×2): qty 250

## 2018-03-27 MED ORDER — ORAL CARE MOUTH RINSE
15.0000 mL | OROMUCOSAL | Status: DC
  Administered 2018-03-28 – 2018-04-03 (×65): 15 mL via OROMUCOSAL

## 2018-03-27 MED ORDER — NICARDIPINE HCL IN NACL 20-0.86 MG/200ML-% IV SOLN
INTRAVENOUS | Status: AC
  Filled 2018-03-27: qty 200

## 2018-03-27 MED ORDER — INSULIN ASPART 100 UNIT/ML ~~LOC~~ SOLN: 1.0000 [IU] | Freq: Four times a day (QID) | SUBCUTANEOUS | Status: DC | PRN

## 2018-03-27 MED ORDER — PROPOFOL 1000 MG/100ML IV EMUL
0.0000 ug/kg/min | INTRAVENOUS | Status: DC
  Administered 2018-03-27: 50 ug/kg/min via INTRAVENOUS
  Administered 2018-03-28: 30 ug/kg/min via INTRAVENOUS
  Administered 2018-03-28: 5 ug/kg/min via INTRAVENOUS
  Administered 2018-03-28: 20 ug/kg/min via INTRAVENOUS
  Administered 2018-03-29: 25 ug/kg/min via INTRAVENOUS
  Administered 2018-03-30: 15 ug/kg/min via INTRAVENOUS
  Filled 2018-03-27 (×5): qty 100

## 2018-03-27 MED ORDER — SODIUM CHLORIDE 0.9 % IV SOLN
250.0000 mL | INTRAVENOUS | Status: DC | PRN
  Administered 2018-03-28: 250 mL via INTRAVENOUS

## 2018-03-27 MED ORDER — ETOMIDATE 2 MG/ML IV SOLN
INTRAVENOUS | Status: AC | PRN
  Administered 2018-03-27: 20 mg via INTRAVENOUS

## 2018-03-27 MED ORDER — FENTANYL CITRATE (PF) 100 MCG/2ML IJ SOLN: 50.0000 ug | Freq: Once | INTRAMUSCULAR | Status: DC

## 2018-03-27 MED ORDER — SUCCINYLCHOLINE CHLORIDE 20 MG/ML IJ SOLN
INTRAMUSCULAR | Status: AC | PRN
  Administered 2018-03-27: 100 mg via INTRAVENOUS

## 2018-03-27 MED ORDER — FENTANYL 2500MCG IN NS 250ML (10MCG/ML) PREMIX INFUSION: 25.0000 ug/h | INTRAVENOUS | Status: DC

## 2018-03-27 MED ORDER — IOPAMIDOL (ISOVUE-370) INJECTION 76%
50.0000 mL | Freq: Once | INTRAVENOUS | Status: AC | PRN
  Administered 2018-03-27: 50 mL via INTRAVENOUS

## 2018-03-27 MED ORDER — FAMOTIDINE IN NACL 20-0.9 MG/50ML-% IV SOLN
20.0000 mg | Freq: Two times a day (BID) | INTRAVENOUS | Status: DC
  Administered 2018-03-28 – 2018-03-30 (×6): 20 mg via INTRAVENOUS
  Filled 2018-03-27 (×6): qty 50

## 2018-03-27 MED ORDER — FAMOTIDINE IN NACL 20-0.9 MG/50ML-% IV SOLN: 20.0000 mg | INTRAVENOUS | Status: DC

## 2018-03-27 MED ORDER — HYDRALAZINE HCL 20 MG/ML IJ SOLN
10.0000 mg | INTRAMUSCULAR | Status: AC
  Administered 2018-03-27: 10 mg via INTRAVENOUS
  Filled 2018-03-27: qty 1

## 2018-03-27 MED ORDER — ACETAMINOPHEN 325 MG PO TABS: 650.0000 mg | ORAL_TABLET | ORAL | Status: DC | PRN

## 2018-03-27 MED ORDER — LORAZEPAM 2 MG/ML IJ SOLN
0.5000 mg | Freq: Once | INTRAMUSCULAR | Status: AC
  Administered 2018-03-27: 0.5 mg via INTRAVENOUS
  Filled 2018-03-27: qty 1

## 2018-03-27 MED ORDER — ACETAMINOPHEN 650 MG RE SUPP
650.0000 mg | RECTAL | Status: DC | PRN
  Administered 2018-04-15 – 2018-04-23 (×4): 650 mg via RECTAL
  Filled 2018-03-27 (×4): qty 1

## 2018-03-27 MED ORDER — CHLORHEXIDINE GLUCONATE 0.12% ORAL RINSE (MEDLINE KIT)
15.0000 mL | Freq: Two times a day (BID) | OROMUCOSAL | Status: DC
  Administered 2018-03-27 – 2018-04-03 (×14): 15 mL via OROMUCOSAL

## 2018-03-27 MED ORDER — STROKE: EARLY STAGES OF RECOVERY BOOK
Freq: Once | Status: AC
  Administered 2018-03-28
  Filled 2018-03-27: qty 1

## 2018-03-27 MED ORDER — MIDAZOLAM HCL 2 MG/2ML IJ SOLN
1.0000 mg | INTRAMUSCULAR | Status: DC | PRN
  Administered 2018-03-30 – 2018-04-02 (×3): 1 mg via INTRAVENOUS
  Filled 2018-03-27 (×3): qty 2

## 2018-03-27 MED ORDER — DOCUSATE SODIUM 50 MG/5ML PO LIQD
100.0000 mg | Freq: Two times a day (BID) | ORAL | Status: DC | PRN
  Administered 2018-04-06: 100 mg
  Filled 2018-03-27: qty 10

## 2018-03-27 MED ORDER — SODIUM CHLORIDE 0.9% IV SOLUTION: Freq: Once | INTRAVENOUS | Status: DC

## 2018-03-27 MED ORDER — BUDESONIDE 0.5 MG/2ML IN SUSP
0.5000 mg | Freq: Two times a day (BID) | RESPIRATORY_TRACT | Status: DC
  Administered 2018-03-28 – 2018-04-22 (×51): 0.5 mg via RESPIRATORY_TRACT
  Filled 2018-03-27 (×51): qty 2

## 2018-03-27 MED ORDER — ONDANSETRON HCL 4 MG/2ML IJ SOLN
4.0000 mg | Freq: Four times a day (QID) | INTRAMUSCULAR | Status: DC | PRN
  Administered 2018-04-22: 4 mg via INTRAVENOUS
  Filled 2018-03-27: qty 2

## 2018-03-27 MED ORDER — LORAZEPAM 2 MG/ML IJ SOLN
1.0000 mg | Freq: Once | INTRAMUSCULAR | Status: AC
  Administered 2018-03-27: 1 mg via INTRAVENOUS
  Filled 2018-03-27: qty 1

## 2018-03-27 MED ORDER — NICARDIPINE HCL IN NACL 20-0.86 MG/200ML-% IV SOLN
3.0000 mg/h | Freq: Once | INTRAVENOUS | Status: AC
  Administered 2018-03-27: 7 mg/h via INTRAVENOUS

## 2018-03-27 MED ORDER — PROPOFOL 1000 MG/100ML IV EMUL
5.0000 ug/kg/min | Freq: Once | INTRAVENOUS | Status: AC
  Administered 2018-03-27: 10 ug/kg/min via INTRAVENOUS

## 2018-03-27 MED ORDER — ARFORMOTEROL TARTRATE 15 MCG/2ML IN NEBU
15.0000 ug | INHALATION_SOLUTION | Freq: Two times a day (BID) | RESPIRATORY_TRACT | Status: DC
  Administered 2018-03-28 – 2018-04-24 (×54): 15 ug via RESPIRATORY_TRACT
  Filled 2018-03-27 (×56): qty 2

## 2018-03-27 NOTE — ED Triage Notes (Signed)
[facility] reports sudden onset 1.5 hsrs agoi of NV and dizziness.Pt sweaty. hx of verrtigo HTN 212/124 Given 4 mg zofran

## 2018-03-27 NOTE — ED Notes (Signed)
Patient transported to CT with RN 

## 2018-03-27 NOTE — ED Notes (Signed)
Pt refuses to wear purwick. Pt is restless. Reported to Dr Vanita Panda.

## 2018-03-27 NOTE — ED Notes (Signed)
Main lab sending special tubes to collect platelet inhibition.

## 2018-03-27 NOTE — ED Notes (Signed)
Delay in lab draw xray at bedside. 

## 2018-03-27 NOTE — Progress Notes (Signed)
RT assisted MD Vanita Panda with intubation of this pt. Pt tolerated well. She has a 7.5 ETT, commercial Tube holder positioned left corner of mouth 25@lip . Oral suctioned pt pre and post intubation, inline suctioned post intubation. Cuff pressure 28 at this time. Pt vent settings:  PRVC VT 470 Rate 15 FIO2 100 PEEP 5  PT tolerating well. Pt will be transferring to 4 N ICU. RT will continue to monitor.

## 2018-03-27 NOTE — Consult Note (Addendum)
Chief Complaint   Chief Complaint  Patient presents with  . Headache    HPI   HPI: Amber Rogers is a 77 y.o. female brought to ER due to headache, nausea, vomiting and dizziness. Symptoms started roughly 12/1230pm. She has a history of vertigo and thought it was just an episode of that. Symptoms continued to progress and become more severe so she asked her BF to call EMS. Patient reportedly hypertensive SBP >200 upon arrival to ER. Blood pressure much improved with medication given by ER.   Patient complains of headache, although improved as well as nausea vomiting, dizziness and photophobia. Takes ASA and Brilinta daily due to prior MI, cannot recall year.    Had a chance to talk to daughter, granddaughter and boyfriend at bedside who state she looks much better now than she did upon arrival.   Patient Active Problem List   Diagnosis Date Noted  . Mixed hyperlipidemia 10/26/2016  . AKI (acute kidney injury) ([place]) 09/28/2016  . Acute kidney injury ([place]) 09/27/2016  . Metabolic acidosis, normal anion gap (NAG) 09/27/2016  . Influenza A 09/27/2016  . Dehydration   . Coronary artery disease due to lipid rich plaque 05/03/2015  . Headache   . Tobacco abuse 04/14/2015  . CKD (chronic kidney disease), stage III ([place]) 04/14/2015  . Sinus bradycardia 04/14/2015  . NSVT (nonsustained ventricular tachycardia) ([place]) 04/14/2015  . NSTEMI (non-ST elevated myocardial infarction) ([place]) 04/13/2015  . Essential hypertension   . Arthritis   . Pneumonia   . Asthma   . Expected blood loss anemia 02/11/2013  . 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 Asthma   . CHF (congestive heart failure) ([place])   . Chronic kidney disease (CKD), active medical management without dialysis, stage 3 (moderate) ([place])   . COPD (chronic obstructive pulmonary disease) ([place])   . Coronary artery disease   . High cholesterol   . Hypertension   . Myocardial infarction  ([place]) 04/2015  . Osteoarthritis    "all over" (09/27/2016)  . Pneumonia 1996    PSH: Past Surgical History:  Procedure Laterality Date  . CARDIAC CATHETERIZATION N/A 04/14/2015   Procedure: Left Heart Cath and Coronary Angiography;  Surgeon: Jolaine Artist, MD;  Location: [place] [facility];  Service: Cardiovascular;  Laterality: N/A;  . CARDIAC CATHETERIZATION N/A 04/14/2015   Procedure: Coronary Stent Intervention;  Surgeon: Lorretta Harp, MD;  Location: [place] [facility];  Service: Cardiovascular;  Laterality: N/A;  . CORONARY ANGIOPLASTY    . JOINT REPLACEMENT    . KNEE ARTHROSCOPY Right 2004  . TOTAL HIP ARTHROPLASTY Right 02/10/2013   Procedure: TOTAL HIP ARTHROPLASTY ANTERIOR APPROACH;  Surgeon: [name], MD;  Location: [facility];  Service: Orthopedics;  Laterality: Right;     (Not in a hospital admission)  SH: Social History   Tobacco Use  . Smoking status: Former Smoker    Packs/day: 0.50    Years: 40.00    Pack years: 20.00    Types: Cigarettes    Last attempt to quit: 04/14/2015    Years since quitting: 2.9  . Smokeless tobacco: Never Used  Substance Use Topics  . Alcohol use: No    Alcohol/week: 0.0 standard drinks  . Drug use: No    MEDS: Prior to Admission medications   Medication Sig Start Date End Date Taking? Authorizing Provider  amLODipine (NORVASC) 10 MG tablet Take 10 mg by mouth  daily.    [provider]  aspirin 81 MG chewable tablet Chew 1 tablet (81 mg total) by mouth daily. 04/16/15   Lyda Jester M, PA-C  atorvastatin (LIPITOR) 80 MG tablet Take 1 tablet (80 mg total) by mouth daily at 6 PM. 04/16/15   Consuelo Pandy, PA-C  nitroGLYCERIN (NITROSTAT) 0.4 MG SL tablet Place 1 tablet (0.4 mg total) under the tongue every 5 (five) minutes x 3 doses as needed for chest pain. 10/25/16   Hilty, Nadean Corwin, MD  prednisoLONE 5 MG TABS tablet Take 5 mg by mouth daily.    [provider]  SYMBICORT 160-4.5 MCG/ACT inhaler  Inhale 2 puffs into the lungs 2 (two) times daily. 10/17/16   [provider]  ticagrelor (BRILINTA) 90 MG TABS tablet Take 1 tablet (90 mg total) by mouth 2 (two) times daily. 10/25/16   Hilty, Nadean Corwin, MD    ALLERGY: No Known Allergies  Social History   Tobacco Use  . Smoking status: Former Smoker    Packs/day: 0.50    Years: 40.00    Pack years: 20.00    Types: Cigarettes    Last attempt to quit: 04/14/2015    Years since quitting: 2.9  . Smokeless tobacco: Never Used  Substance Use Topics  . Alcohol use: No    Alcohol/week: 0.0 standard drinks     History reviewed. No pertinent family history.   ROS   ROS  Exam   Vitals:   03/27/18 1515 03/27/18 1520  BP: (!) 169/65 (!) 159/56  Pulse: 93 94  Resp: 17 (!) 21  SpO2: 100% 100%   General appearance: elderly female, laying on stretcher, eyes closed Eyes: PERRL, Fundoscopic: unable to assess Cardiovascular: Regular rate and rhythm without murmurs, rubs, gallops. No edema or variciosities. Distal pulses normal. Pulmonary: Clear to auscultation Musculoskeletal:     Muscle tone upper extremities: age appropriate    Muscle tone lower extremities: age appropriate    Motor exam: Upper Extremities Deltoid Bicep Tricep Grip  Right 5/5 5/5 5/5 5/5  Left 5/5 5/5 5/5 5/5   Lower Extremity IP Quad PF DF EHL  Right 5/5 5/5 5/5 5/5 5/5  Left 5/5 5/5 5/5 5/5 5/5   Neurological Sleeping but easily awakened Keeps eyes closed, but will open on command. + nystagmus. Oriented to self (name and DOB), location but not year Slurred speech; + facial droop Follows simple commands.  Abnormal coordination with FTN and FTF Mild drift  Results - Imaging/Labs   Results for orders placed or performed during the hospital encounter of 03/27/18 (from the past 48 hour(s))  Ethanol     Status: None   Collection Time: 03/27/18  1:54 PM  Result Value Ref Range   Alcohol, Ethyl (B) <10 <10 mg/dL    Comment: (NOTE) Lowest  detectable limit for serum alcohol is 10 mg/dL. For medical purposes only. Performed at [facility], [place] [address] [place], [place] [address]   Brain natriuretic peptide     Status: Abnormal   Collection Time: 03/27/18  1:57 PM  Result Value Ref Range   B Natriuretic Peptide 106.3 (H) 0.0 - 100.0 pg/mL    Comment: Performed at [facility] [address], [place], [place] [address]  Comprehensive metabolic panel     Status: Abnormal   Collection Time: 03/27/18  2:03 PM  Result Value Ref Range   Sodium 138 135 - 145 mmol/L   Potassium 4.2 3.5 - 5.1  mmol/L    Comment: SPECIMEN HEMOLYZED. HEMOLYSIS MAY AFFECT INTEGRITY OF RESULTS.   Chloride 105 98 - 111 mmol/L   CO2 23 22 - 32 mmol/L   Glucose, Bld 178 (H) 70 - 99 mg/dL   BUN 22 8 - 23 mg/dL   Creatinine, Ser 1.36 (H) 0.44 - 1.00 mg/dL   Calcium 8.8 (L) 8.9 - 10.3 mg/dL   Total Protein 6.6 6.5 - 8.1 g/dL   Albumin 3.4 (L) 3.5 - 5.0 g/dL   AST 25 15 - 41 U/L   ALT 16 0 - 44 U/L   Alkaline Phosphatase 82 38 - 126 U/L   Total Bilirubin 1.2 0.3 - 1.2 mg/dL   GFR calc non Af Amer 37 (L) >60 mL/min   GFR calc Af Amer 43 (L) >60 mL/min    Comment: (NOTE) The eGFR has been calculated using the CKD EPI equation. This calculation has not been validated in all clinical situations. eGFR's persistently <60 mL/min signify possible Chronic Kidney Disease.    Anion gap 10 5 - 15    Comment: Performed at [facility] [address], [place], [place] [address]  Troponin I     Status: None   Collection Time: 03/27/18  2:03 PM  Result Value Ref Range   Troponin I <0.03 <0.03 ng/mL    Comment: Performed at [facility] [address] [place], [place] [address]  CBC with Differential     Status: Abnormal   Collection Time: 03/27/18  2:03 PM  Result Value Ref Range   WBC 14.9 (H) 4.0 - 10.5 K/uL   RBC 4.34 3.87 - 5.11 MIL/uL   Hemoglobin 11.6 (L) 12.0 - 15.0 g/dL   HCT 37.5 36.0 - 46.0 %   MCV 86.4 78.0 - 100.0  fL   MCH 26.7 26.0 - 34.0 pg   MCHC 30.9 30.0 - 36.0 g/dL   RDW 15.4 11.5 - 15.5 %   Platelets 308 150 - 400 K/uL   Neutrophils Relative % 81 %   Neutro Abs 12.2 (H) 1.7 - 7.7 K/uL   Lymphocytes Relative 10 %   Lymphs Abs 1.5 0.7 - 4.0 K/uL   Monocytes Relative 6 %   Monocytes Absolute 0.9 0.1 - 1.0 K/uL   Eosinophils Relative 1 %   Eosinophils Absolute 0.2 0.0 - 0.7 K/uL   Basophils Relative 1 %   Basophils Absolute 0.1 0.0 - 0.1 K/uL   Immature Granulocytes 1 %   Abs Immature Granulocytes 0.1 0.0 - 0.1 K/uL    Comment: Performed at [facility], [place] [address], [place], [place] [address]    Ct Head Wo Contrast  Result Date: 03/27/2018 CLINICAL DATA:  Sudden onset nausea and vomiting EXAM: CT HEAD WITHOUT CONTRAST TECHNIQUE: Contiguous axial images were obtained from the base of the skull through the vertex without intravenous contrast. COMPARISON:  04/16/2015 FINDINGS: Brain: 20 mm hematoma in the central left cerebellum with extension into the fourth ventricle sella projecting LVAD remote of [place] continuing superiorly through the cerebral aqua duct and into the third and lateral ventricles. There is lateral ventriculomegaly when compared to 2016. Periventricular low-density about the atria could be small-vessel disease and/or transependymal CSF flow when compared with prior. 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 Vanita Panda was stabilizing a patient. The findings were already known to him when we reviewed the images by telephone  at 2:45 p.m. 03/27/2018. IMPRESSION: 2 cm hematoma in the deep left cerebellum with fourth ventricular extension extending into the third/lateral ventricles and into the dorsal cervical canal. There is mild hydrocephalus at the lateral ventricles when compared to 2016. A deep cerebellar origin favors hypertensive etiology. Electronically  Signed   By: Monte Fantasia M.D.   On: 03/27/2018 14:49    Impression/Plan   77 y.o. 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  Drowsy but easily awakens and follows commands. No focal extremity deficits.  Etiology likely hypertensive. Will obtain CTA head to r/o aneurysm.   I had a long discussion with the family regarding EVD placement. While she is easily awakened, follows commands and is oriented x2, she will likely need an EVD due nature of this hemorrhage and the likelihood of  developing hydrocephalus. We discussed placing now vs. Continuing to monitor. After a long discussion, the family would like [place] to continue to monitor her closely as they feel as though she has improved since being in the ER. Should there be any acute change neurologically, we will proceed with EVD placement emergently. I will plan on following up with patient within the next several hours. Pending her exam, we may consider EVD placement prophylactically, again based on the nature of the hemorrhage. This was communicated with BF, daughter and granddaughter. Daughter will be making medical decisions. She states understanding of the above and is agreement.  Requires admission under PCCM or Neurology for medical management. Will follow for possible NS intervention.   Addendum Patient on brilinta and asa. D/C P2Y12 ordered to assess function. May consider platelet transfusion.

## 2018-03-27 NOTE — ED Notes (Signed)
Reported pressures to Dr Vanita Panda titration of 7

## 2018-03-27 NOTE — Progress Notes (Signed)
RT transported pt to 4N ICU 30. Transported without complications. Pt suctioned both orally and inline ETT after transport. Pt stable and vitals WNL. Receiving RT given report and pt care transferred.

## 2018-03-27 NOTE — H&P (Signed)
Neurology H&P   CC: Headache  History is obtained from:Husband  HPI: Amber Rogers is a 77 y.o. female with a history of CHF, HTN, hyperlipidemia who presents with cerebellar hemorrhage with intraventricular extension.   She was in her normal state of health until around noon when she had acute onset of nausea vomiting, dizziness.  She also endorsed headache.  She presented to the emergency department where CT scan was done which showed a hematoma in the left cerebellum with what appeared to be early hydrocephalus.  Neurosurgery was consulted, favor holding off on emergent ventriculostomy given the fact that she is on dual therapy.  However, per her husband she has continued to decline over the past few hours and is worse now than she was even an hour ago with Dr. Kathyrn Sheriff evaluated her.   LKW: Noon tpa given?: no, ICH ICH Score: 2  ROS:  Unable to obtain due to altered mental status.   Past Medical History:  Diagnosis Date  . Asthma   . CHF (congestive heart failure) ([place])   . Chronic kidney disease (CKD), active medical management without dialysis, stage 3 (moderate) ([place])   . COPD (chronic obstructive pulmonary disease) ([place])   . Coronary artery disease   . High cholesterol   . Hypertension   . Myocardial infarction ([place]) 04/2015  . Osteoarthritis    "all over" (09/27/2016)  . Pneumonia 1996     History reviewed. No pertinent family history.   Social History:  reports that she quit smoking about 2 years ago. Her smoking use included cigarettes. She has a 20.00 pack-year smoking history. She has never used smokeless tobacco. She reports that she does not drink alcohol or use drugs.   Exam: Current vital signs: BP (!) 158/87   Pulse 88   Resp (!) 21   Ht 5\' 7"  (1.702 m)   Wt 66 kg   SpO2 97%   BMI 22.79 kg/m  Vital signs in last 24 hours: Pulse Rate:  [83-98] 88 (08/22 1915) Resp:  [15-24] 21 (08/22 1930) BP: (118-224)/(53-158) 158/87 (08/22 1930) SpO2:  [93  %-100 %] 97 % (08/22 1915) Weight:  [21 kg] 66 kg (08/22 1350)  Physical Exam  Constitutional: Appears well-developed and well-nourished.  Psych: Affect appropriate to situation Eyes: No scleral injection HENT: No OP obstrucion Head: Normocephalic.  Cardiovascular: Normal rate and regular rhythm.  Respiratory: Effort normal and breath sounds normal to anterior ascultation GI: Soft.  No distension. There is no tenderness.  Skin: WDI  Neuro: Mental Status: Patient is lethargic but does arouse and answers some simple questions, follows some commands but not all.  She has difficulty with following complex commands such as doing finger-nose-finger. Cranial Nerves: II: Visual fields are full pupils are equal, round, and reactive to light.   III,IV, VI: She does not look to the right, when looking to the left to the right eye adduction but the left eye does not abductor V: Facial sensation is symmetric to temperature VII: Facial movement is symmetric.  VIII: hearing is intact to voice X: Uvula elevates symmetrically XI: Shoulder shrug is symmetric. XII: tongue is midline without atrophy or fasciculations.  Motor: She does not appear to move the right side as well as left, but does lift against gravity and does not comply with formal strength testing in any of her extremities. Sensory: Sensation is intact light touch Cerebellar: She does not have any obvious ataxia when reaching for objects, but does not comply with formal  testing.  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  With her worsening mental status and hydrocephalus on CT, I have discussed her case with neurosurgery we will plan on doing an EVD.  Recommendations: 1) Admit to ICU 2) no antiplatelets or anticoagulants 3) blood pressure control with goal systolic 017 - 494 4) Frequent neuro checks 5) If symptoms worsen or there is decreased mental status, repeat stat head CT 6) PT,OT,ST 7) appreciate neurosurgery input    This patient is critically ill and at significant risk of neurological worsening, death and care requires constant monitoring of vital signs, hemodynamics,respiratory and cardiac monitoring, neurological assessment, discussion with family, other specialists and medical decision making of high complexity. I spent 50 minutes of neurocritical care time  in the care of  this patient.  Roland Rack, MD [facility] [phone]  If 7pm- 7am, please page neurology on call as listed in [place]. 03/27/2018  7:42 PM

## 2018-03-27 NOTE — Progress Notes (Signed)
I spoke with Dr. Leonel Ramsay who evaluated the patient some time after me. He does report some decline in comparison to my exam, and the family also report worsening lethargy/sleepiness. We therefore elected to intubate the patient for airway protection and proceed with EVD.

## 2018-03-27 NOTE — ED Notes (Signed)
Reported bp to Dr Vanita Panda, 164/65. Holding at 5mg  per his comment.

## 2018-03-27 NOTE — Consult Note (Signed)
PULMONARY / CRITICAL CARE MEDICINE   Name: Amber Rogers MRN: [id number] DOB: [birth year]    ADMISSION DATE:  03/27/2018 CONSULTATION DATE:  03/27/2018  REFERRING MD:  Dr. Leonel Ramsay  CHIEF COMPLAINT:  ICH  HISTORY OF PRESENT ILLNESS:  Patient is encephalopathic and/or intubated. Therefore history has been obtained from chart review. 77 year old female with PMH as below, which is significant for HTN, CKD, CHF (EF 55% and grade 2 DD in 2016), and COPD. She presented to [facility] ED 8/22 with complaints of headache, nausea, and vomiting with onset of symptoms around 1200. She was found to be hypertensive. CT scan demonstrated hematoma in the left cerebellum with early hydrocephalus. Initially no neurosurgical intervention was indicated due to her reasonable mental status and dual antiplatelet regimen, however, her condition continued to decline and she required intubation. She was then deemed a candidate for EVD placement. PCCM consulted for medical/vent/BP management.  PAST MEDICAL HISTORY :  She  has a past medical history of Asthma, CHF (congestive heart failure) ([place]), Chronic kidney disease (CKD), active medical management without dialysis, stage 3 (moderate) ([place]), COPD (chronic obstructive pulmonary disease) ([place]), Coronary artery disease, High cholesterol, Hypertension, Myocardial infarction ([place]) (04/2015), Osteoarthritis, and Pneumonia (1996).  PAST SURGICAL HISTORY: She  has a past surgical history that includes Total hip arthroplasty (Right, 02/10/2013); Knee arthroscopy (Right, 2004); Joint replacement; Cardiac catheterization (N/A, 04/14/2015); Cardiac catheterization (N/A, 04/14/2015); and Coronary angioplasty.  No Known Allergies  No current facility-administered medications on file prior to encounter.    Current Outpatient Medications on File Prior to Encounter  Medication Sig  . amLODipine (NORVASC) 10 MG tablet Take 10 mg by mouth daily.  Marland Kitchen aspirin 81 MG chewable tablet Chew 1  tablet (81 mg total) by mouth daily.  Marland Kitchen atorvastatin (LIPITOR) 80 MG tablet Take 1 tablet (80 mg total) by mouth daily at 6 PM. (Patient not taking: Reported on 03/27/2018)  . nitroGLYCERIN (NITROSTAT) 0.4 MG SL tablet Place 1 tablet (0.4 mg total) under the tongue every 5 (five) minutes x 3 doses as needed for chest pain.  . predniSONE (DELTASONE) 5 MG tablet Take 5 mg by mouth daily.  . SYMBICORT 160-4.5 MCG/ACT inhaler Inhale 2 puffs into the lungs 2 (two) times daily.  . ticagrelor (BRILINTA) 90 MG TABS tablet Take 1 tablet (90 mg total) by mouth 2 (two) times daily.    FAMILY HISTORY:  Her family history is not on file.  SOCIAL HISTORY: She  reports that she quit smoking about 2 years ago. Her smoking use included cigarettes. She has a 20.00 pack-year smoking history. She has never used smokeless tobacco. She reports that she does not drink alcohol or use drugs.  REVIEW OF SYSTEMS:   Unable as patient is encephalopathic and intubated  SUBJECTIVE:    VITAL SIGNS: BP (!) 138/59   Pulse 87   Resp 20   Ht 5\' 6"  (1.676 m)   Wt 67.8 kg   SpO2 100%   BMI 24.13 kg/m   HEMODYNAMICS:    VENTILATOR SETTINGS: Vent Mode: PRVC FiO2 (%):  [100 %] 100 % Set Rate:  [5 bmp] 5 bmp Vt Set:  [470 mL] 470 mL PEEP:  [5 cmH20] 5 cmH20 Plateau Pressure:  [15 cmH20] 15 cmH20  INTAKE / OUTPUT: I/O last 3 completed shifts: In: 210.1 [I.V.:210.1] Out: -   PHYSICAL EXAMINATION: General: Elderly female, intubated sedated critically ill Neuro: pupils pinpoint b/l, withdraws arms in response to sternal rub, not obeying commands  HEENT: Orally intubated, MM moist, OP clear  Cardiovascular: RRR no m/r/g Lungs: CTA b/l Abdomen: Soft protuberant, nontender Musculoskeletal: no LE edema  Skin: no rashes   LABS:  BMET Recent Labs  Lab 03/27/18 1403  NA 138  K 4.2  CL 105  CO2 23  BUN 22  CREATININE 1.36*  GLUCOSE 178*    Electrolytes Recent Labs  Lab 03/27/18 1403  CALCIUM 8.8*     CBC Recent Labs  Lab 03/27/18 1403  WBC 14.9*  HGB 11.6*  HCT 37.5  PLT 308    Coag's No results for input(s): APTT, INR in the last 168 hours.  Sepsis Markers No results for input(s): LATICACIDVEN, PROCALCITON, O2SATVEN in the last 168 hours.  ABG No results for input(s): PHART, PCO2ART, PO2ART in the last 168 hours.  Liver Enzymes Recent Labs  Lab 03/27/18 1403  AST 25  ALT 16  ALKPHOS 82  BILITOT 1.2  ALBUMIN 3.4*    Cardiac Enzymes Recent Labs  Lab 03/27/18 1403  TROPONINI <0.03    Glucose No results for input(s): GLUCAP in the last 168 hours.  Imaging Ct Angio Head W Or Wo Contrast  Result Date: 03/27/2018 CLINICAL DATA:  77 y/o  F; nausea, vomiting, dizziness. EXAM: CT ANGIOGRAPHY HEAD TECHNIQUE: Multidetector CT imaging of the head was performed using the standard protocol during bolus administration of intravenous contrast. Multiplanar CT image reconstructions and MIPs were obtained to evaluate the vascular anatomy. CONTRAST:  21mL ISOVUE-370 IOPAMIDOL (ISOVUE-370) INJECTION 76% COMPARISON:  03/27/2018 CT head. FINDINGS: CTA HEAD Anterior circulation: No significant stenosis, proximal occlusion, aneurysm, or vascular malformation. Calcified plaque of the carotid siphons with mild bilateral distal cavernous stenosis. Posterior circulation: No significant stenosis, proximal occlusion, aneurysm, or vascular malformation. Calcified plaque of the vertebral arteries with mild stenosis downstream to the PICA origins. Venous sinuses: As permitted by contrast timing, patent. Anatomic variants: Patent anterior communicating artery. No posterior communicating artery identified, likely hypoplastic or absent. Delayed phase: No abnormal intracranial enhancement. IMPRESSION: 1. No vascular malformation or abnormal enhancement of the brain identified. 2. Stable cerebellar and intraventricular hemorrhage. 3. Patent anterior and posterior intracranial circulation. No large  vessel occlusion, aneurysm, or significant stenosis. 4. Calcific atherosclerosis of the carotid and vertebral arteries with mild stenosis. Electronically Signed   By: Kristine Garbe M.D.   On: 03/27/2018 17:52   Ct Head Wo Contrast  Result Date: 03/27/2018 CLINICAL DATA:  Sudden onset nausea and vomiting EXAM: CT HEAD WITHOUT CONTRAST TECHNIQUE: Contiguous axial images were obtained from the base of the skull through the vertex without intravenous contrast. COMPARISON:  04/16/2015 FINDINGS: Brain: 20 mm hematoma in the central left cerebellum with extension into the fourth ventricle sella projecting LVAD remote of [place] continuing superiorly through the cerebral aqua duct and into the third and lateral ventricles. There is lateral ventriculomegaly when compared to 2016. Periventricular low-density about the atria could be small-vessel disease and/or transependymal CSF flow when compared with prior. 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 Vanita Panda was stabilizing a patient. The findings were already known to him when we reviewed the images by telephone at 2:45 p.m. 03/27/2018. IMPRESSION: 2 cm hematoma in the deep left cerebellum with fourth ventricular extension extending into the third/lateral ventricles and into the dorsal cervical canal. There is mild hydrocephalus at the lateral ventricles when compared to 2016. A deep cerebellar origin favors hypertensive etiology. Electronically Signed  By: Monte Fantasia M.D.   On: 03/27/2018 14:49   Dg Chest Portable 1 View  Result Date: 03/27/2018 CLINICAL DATA:  Post intubation EXAM: PORTABLE CHEST 1 VIEW COMPARISON:  Portable exam 2027 hours compared to 09/28/2016 FINDINGS: Tip of endotracheal tube projects 1.5 cm above carina. Nasogastric tube extends into stomach. Minimal enlargement of cardiac silhouette. Mediastinal contours and  pulmonary vascularity normal. Lungs clear. No pleural effusion or pneumothorax. Bones demineralized. IMPRESSION: Minimal enlargement of cardiac silhouette. Satisfactory tube positions as above. Electronically Signed   By: Lavonia Dana M.D.   On: 03/27/2018 20:45     STUDIES:  CT head 8/22 > 2 cm hematoma in the deep left cerebellum with fourth ventricular extension extending into the third/lateral ventricles and into the dorsal cervical canal. There is mild hydrocephalus at the lateral ventricles when compared to 2016. A deep cerebellar origin favors hypertensive etiology. CT angio head 8/22 > No vascular malformation or abnormal enhancement of the brain identified. Stable cerebellar and intraventricular hemorrhage. Patent anterior and posterior intracranial circulation. No large vessel occlusion, aneurysm, or significant stenosis. Calcific atherosclerosis of the carotid and vertebral arteries with mild stenosis.  CULTURES: None   ANTIBIOTICS: None   SIGNIFICANT EVENTS: 8/22 > admit, EVD placed.   LINES/TUBES: ETT 8/22 > EVD 8/22 >  ASSESSMENT / PLAN:  Intracranial hemorrhage 2cm L cerebellar hematoma with intraventricular extension. Secondary to hypertensive crisis. EVD placed by neurosurgery 8/22.  - Management per neurology and neurosurgery - BP management with nicardipine for SBP goal less than 160 mmHg per Neurosurgery - Hold home aspirin, Brilinta.  - Platelet inhibition p2y12 pending - Transfuse platelets   Acute Hypoxic Respiratory failure: - intubated for airway protection in the setting of ICH - Full vent support - VAP bundle - Check ABG now and adjust vent as needed - CXR on my review shows ETT in good position; no infiltrates - Propofol infusion and fentanyl infusion to keep RASS -1 to -2.   COPD without acute exacerbation - Scheduled duonebs, budesonide nebs, brovana nebs - Prednisone scheduled as a home med, but not clear why she takes this. Only on Symbicort for  COPD, doesn't seem to be end stage. Will hold prednisone.   Chronic CHF - Telemetry monitoring - Holding amlodipine  60 minutes critical care time  Vernie Murders, MD Pulmonary & Critical Care Medicine Pager: [phone]   03/27/2018 9:27 PM

## 2018-03-27 NOTE — Progress Notes (Signed)
Came back for reassessment. Her daughter and Bf are at bedside. Patient remains drowsy but easily awakened She states she has a mild headache.  Follows commands well. Remains oriented  CTA reviewed and negative for aneurysm.  Attending Dr Kathyrn Sheriff to come to discuss possible EVD placement prophylactically based on nature of hemorrhage. See consult attestation.

## 2018-03-27 NOTE — ED Provider Notes (Signed)
[facility] EMERGENCY DEPARTMENT Provider Note   CSN: [id number] Arrival date & time: 03/27/18  1338     History   Chief Complaint Chief Complaint  Patient presents with  . Headache    HPI GIAVANNI Rogers is a 77 y.o. female.  HPI Patient presents with concern of headache, nausea, vomiting, dizziness. With subacute, within the past 3 hours. She does have a history of vertigo, but notes that the symptoms are substantially more severe than those. Dizziness is persistent, worse with eye opening. EMS notes the patient had vomiting on their arrival, but was awake and alert, though with her eyes closed throughout transport. Patient also noted to be hypertensive, with systolic greater than 213 during transport. Patient did not improve with Zofran provided in route. Patient herself states that she feels weak all over, though not focally so, also denies confusion, vision loss, though she acknowledges dizziness worse with eye opening. Past Medical History:  Diagnosis Date  . Asthma   . CHF (congestive heart failure) ([place])   . Chronic kidney disease (CKD), active medical management without dialysis, stage 3 (moderate) ([place])   . COPD (chronic obstructive pulmonary disease) ([place])   . Coronary artery disease   . High cholesterol   . Hypertension   . Myocardial infarction ([place]) 04/2015  . Osteoarthritis    "all over" (09/27/2016)  . Pneumonia 1996    Patient Active Problem List   Diagnosis Date Noted  . ICH (intracerebral hemorrhage) ([place]) 03/27/2018  . Mixed hyperlipidemia 10/26/2016  . AKI (acute kidney injury) ([place]) 09/28/2016  . Acute kidney injury ([place]) 09/27/2016  . Metabolic acidosis, normal anion gap (NAG) 09/27/2016  . Influenza A 09/27/2016  . Dehydration   . Coronary artery disease due to lipid rich plaque 05/03/2015  . Headache   . Tobacco abuse 04/14/2015  . CKD (chronic kidney disease), stage III ([place]) 04/14/2015  . Sinus bradycardia 04/14/2015    . NSVT (nonsustained ventricular tachycardia) ([place]) 04/14/2015  . NSTEMI (non-ST elevated myocardial infarction) ([place]) 04/13/2015  . Essential hypertension   . Arthritis   . Pneumonia   . Asthma   . Expected blood loss anemia 02/11/2013  . 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 CARDIAC CATHETERIZATION N/A 04/14/2015   Procedure: Left Heart Cath and Coronary Angiography;  Surgeon: Jolaine Artist, MD;  Location: [facility];  Service: Cardiovascular;  Laterality: N/A;  . CARDIAC CATHETERIZATION N/A 04/14/2015   Procedure: Coronary Stent Intervention;  Surgeon: Lorretta Harp, MD;  Location: [facility];  Service: Cardiovascular;  Laterality: N/A;  . CORONARY ANGIOPLASTY    . JOINT REPLACEMENT    . KNEE ARTHROSCOPY Right 2004  . TOTAL HIP ARTHROPLASTY Right 02/10/2013   Procedure: TOTAL HIP ARTHROPLASTY ANTERIOR APPROACH;  Surgeon: [name], MD;  Location: [facility];  Service: Orthopedics;  Laterality: Right;     OB History   None      Home Medications    Prior to Admission medications   Medication Sig Start Date End Date Taking? Authorizing Provider  amLODipine (NORVASC) 10 MG tablet Take 10 mg by mouth daily.    [provider]  aspirin 81 MG chewable tablet Chew 1 tablet (81 mg total) by mouth daily. 04/16/15   Lyda Jester M, PA-C  atorvastatin (LIPITOR) 80 MG tablet Take 1 tablet (80 mg total) by mouth daily at 6 PM. Patient not taking: Reported on  03/27/2018 04/16/15   Lyda Jester M, PA-C  nitroGLYCERIN (NITROSTAT) 0.4 MG SL tablet Place 1 tablet (0.4 mg total) under the tongue every 5 (five) minutes x 3 doses as needed for chest pain. 10/25/16   Hilty, Nadean Corwin, MD  predniSONE (DELTASONE) 5 MG tablet Take 5 mg by mouth daily. 01/20/18   [provider]  SYMBICORT 160-4.5 MCG/ACT inhaler Inhale 2 puffs into the lungs 2 (two) times daily. 10/17/16   [provider]   ticagrelor (BRILINTA) 90 MG TABS tablet Take 1 tablet (90 mg total) by mouth 2 (two) times daily. 10/25/16   Hilty, Nadean Corwin, MD    Family History History reviewed. No pertinent family history.  Social History Social History   Tobacco Use  . Smoking status: Former Smoker    Packs/day: 0.50    Years: 40.00    Pack years: 20.00    Types: Cigarettes    Last attempt to quit: 04/14/2015    Years since quitting: 2.9  . Smokeless tobacco: Never Used  Substance Use Topics  . Alcohol use: No    Alcohol/week: 0.0 standard drinks  . Drug use: No     Allergies   Patient has no known allergies.   Review of Systems Review of Systems  Constitutional:       Per HPI, otherwise negative  HENT:       Per HPI, otherwise negative  Respiratory:       Per HPI, otherwise negative  Cardiovascular:       Per HPI, otherwise negative  Gastrointestinal: Positive for nausea and vomiting.  Endocrine:       Negative aside from HPI  Genitourinary:       Neg aside from HPI   Musculoskeletal:       Per HPI, otherwise negative  Skin: Negative.   Neurological: Positive for dizziness. Negative for syncope.     Physical Exam Updated Vital Signs BP (!) 154/68   Pulse 94   Resp 20   Ht 5\' 7"  (1.702 m)   Wt 66 kg   SpO2 99%   BMI 22.79 kg/m   Physical Exam  Constitutional: She is oriented to person, place, and time. She appears well-developed and well-nourished. No distress.  Uncomfortable appearing thin elderly female with her eyes closed, but moving all extremities spontaneously, minimally.   HENT:  Head: Normocephalic and atraumatic.  Eyes: Conjunctivae and EOM are normal.  Cardiovascular: Normal rate and regular rhythm.  Pulmonary/Chest: Effort normal and breath sounds normal. No stridor. No respiratory distress.  Abdominal: She exhibits no distension.  Musculoskeletal: She exhibits no edema or tenderness.  Neurological: She is alert and oriented to person, place, and time. No  cranial nerve deficit.  Skin: Skin is warm and dry.  Psychiatric: She has a normal mood and affect.  Nursing note and vitals reviewed.    ED Treatments / Results  Labs (all labs ordered are listed, but only abnormal results are displayed) Labs Reviewed  COMPREHENSIVE METABOLIC PANEL - Abnormal; Notable for the following components:      Result Value   Glucose, Bld 178 (*)    Creatinine, Ser 1.36 (*)    Calcium 8.8 (*)    Albumin 3.4 (*)    GFR calc non Af Amer 37 (*)    GFR calc Af Amer 43 (*)    All other components within normal limits  CBC WITH DIFFERENTIAL/PLATELET - Abnormal; Notable for the following components:   WBC 14.9 (*)  Hemoglobin 11.6 (*)    Neutro Abs 12.2 (*)    All other components within normal limits  URINALYSIS, ROUTINE W REFLEX MICROSCOPIC - Abnormal; Notable for the following components:   Color, Urine STRAW (*)    Glucose, UA 50 (*)    Ketones, ur 5 (*)    Protein, ur 30 (*)    All other components within normal limits  BRAIN NATRIURETIC PEPTIDE - Abnormal; Notable for the following components:   B Natriuretic Peptide 106.3 (*)    All other components within normal limits  ETHANOL  TROPONIN I  PLATELET INHIBITION P2Y12    EKG EKG Interpretation  Date/Time:  Thursday March 27 2018 15:47:52 EDT Ventricular Rate:  97 PR Interval:    QRS Duration: 86 QT Interval:  352 QTC Calculation: 448 R Axis:   -23 Text Interpretation:  Sinus rhythm Prolonged PR interval Borderline left axis deviation Anterior infarct, old ST-t wave abnormality Abnormal ekg Confirmed by Carmin Muskrat [id number]) on 03/27/2018 3:51:25 PM Also confirmed by Carmin Muskrat ([id number]), editor Philomena Doheny [phone])  on 03/27/2018 4:14:31 PM   Radiology Ct Angio Head W Or Wo Contrast  Result Date: 03/27/2018 CLINICAL DATA:  77 y/o  F; nausea, vomiting, dizziness. EXAM: CT ANGIOGRAPHY HEAD TECHNIQUE: Multidetector CT imaging of the head was performed using the standard protocol  during bolus administration of intravenous contrast. Multiplanar CT image reconstructions and MIPs were obtained to evaluate the vascular anatomy. CONTRAST:  25mL ISOVUE-370 IOPAMIDOL (ISOVUE-370) INJECTION 76% COMPARISON:  03/27/2018 CT head. FINDINGS: CTA HEAD Anterior circulation: No significant stenosis, proximal occlusion, aneurysm, or vascular malformation. Calcified plaque of the carotid siphons with mild bilateral distal cavernous stenosis. Posterior circulation: No significant stenosis, proximal occlusion, aneurysm, or vascular malformation. Calcified plaque of the vertebral arteries with mild stenosis downstream to the PICA origins. Venous sinuses: As permitted by contrast timing, patent. Anatomic variants: Patent anterior communicating artery. No posterior communicating artery identified, likely hypoplastic or absent. Delayed phase: No abnormal intracranial enhancement. IMPRESSION: 1. No vascular malformation or abnormal enhancement of the brain identified. 2. Stable cerebellar and intraventricular hemorrhage. 3. Patent anterior and posterior intracranial circulation. No large vessel occlusion, aneurysm, or significant stenosis. 4. Calcific atherosclerosis of the carotid and vertebral arteries with mild stenosis. Electronically Signed   By: Kristine Garbe M.D.   On: 03/27/2018 17:52   Ct Head Wo Contrast  Result Date: 03/27/2018 CLINICAL DATA:  Sudden onset nausea and vomiting EXAM: CT HEAD WITHOUT CONTRAST TECHNIQUE: Contiguous axial images were obtained from the base of the skull through the vertex without intravenous contrast. COMPARISON:  04/16/2015 FINDINGS: Brain: 20 mm hematoma in the central left cerebellum with extension into the fourth ventricle sella projecting LVAD remote of [place] continuing superiorly through the cerebral aqua duct and into the third and lateral ventricles. There is lateral ventriculomegaly when compared to 2016. Periventricular low-density about the atria  could be small-vessel disease and/or transependymal CSF flow when compared with prior. 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 Vanita Panda was stabilizing a patient. The findings were already known to him when we reviewed the images by telephone at 2:45 p.m. 03/27/2018. IMPRESSION: 2 cm hematoma in the deep left cerebellum with fourth ventricular extension extending into the third/lateral ventricles and into the dorsal cervical canal. There is mild hydrocephalus at the lateral ventricles when compared to 2016. A deep cerebellar origin favors hypertensive etiology. Electronically Signed   By: Angelica Chessman  Watts M.D.   On: 03/27/2018 14:49    Procedures Procedure Name: Intubation Date/Time: 03/27/2018 8:33 PM Performed by: Carmin Muskrat, MD Pre-anesthesia Checklist: Patient identified, Patient being monitored, Emergency Drugs available, Timeout performed and Suction available Oxygen Delivery Method: Nasal cannula Preoxygenation: Pre-oxygenation with 100% oxygen Induction Type: Rapid sequence Ventilation: Unable to mask ventilate Laryngoscope Size: Glidescope and 3 Grade View: Grade I Tube type: Subglottic suction tube Tube size: 7.5 mm Number of attempts: 1 Airway Equipment and Method: Video-laryngoscopy Placement Confirmation: ETT inserted through vocal cords under direct vision,  CO2 detector,  Breath sounds checked- equal and bilateral and Positive ETCO2 Secured at: 24 cm Tube secured with: ETT holder Dental Injury: Teeth and Oropharynx as per pre-operative assessment  Comments: Tolerated intubation after sedation with etomidate, paralysis with succinylcholine with no complications.       (including critical care time)  Medications Ordered in ED Medications   stroke: mapping our early stages of recovery book (has no administration in time range)  acetaminophen  (TYLENOL) tablet 650 mg (has no administration in time range)    Or  acetaminophen (TYLENOL) solution 650 mg (has no administration in time range)    Or  acetaminophen (TYLENOL) suppository 650 mg (has no administration in time range)  senna-docusate (Senokot-S) tablet 1 tablet (has no administration in time range)  famotidine (PEPCID) IVPB 20 mg premix (has no administration in time range)  propofol (DIPRIVAN) 1000 MG/100ML infusion (has no administration in time range)  propofol (DIPRIVAN) 1000 MG/100ML infusion (has no administration in time range)  hydrALAZINE (APRESOLINE) injection 10 mg (10 mg Intravenous Given 03/27/18 1441)  LORazepam (ATIVAN) injection 1 mg (1 mg Intravenous Given 03/27/18 1438)  nicardipine (CARDENE) 20mg  in 0.86% saline 224ml IV infusion (0.1 mg/ml) (0 mg/hr Intravenous Stopped 03/27/18 1855)  iopamidol (ISOVUE-370) 76 % injection 50 mL (50 mLs Intravenous Contrast Given 03/27/18 1702)  nicardipine (CARDENE) 20mg  in 0.86% saline 238ml IV infusion (0.1 mg/ml) (9.5 mg/hr Intravenous Rate/Dose Change 03/27/18 1929)  LORazepam (ATIVAN) injection 0.5 mg (0.5 mg Intravenous Given 03/27/18 1900)  etomidate (AMIDATE) injection (20 mg Intravenous Given 03/27/18 2021)  succinylcholine (ANECTINE) injection (100 mg Intravenous Given 03/27/18 2021)     Initial Impression / Assessment and Plan / ED Course  I have reviewed the triage vital signs and the nursing notes.  Pertinent labs & imaging results that were available during my care of the patient were reviewed by me and considered in my medical decision making (see chart for details).    Initial pressure greater than 811 systolic, patient provided hydralazine. With concern for intracranial process, stat CT also arranged.  Head CT notable for intraventricular, intraparenchymal bleed. On repeat exam after patient returned, she remains oriented to self, moving all extremities, though hesitant to open her eyes or move her head  secondary to discomfort. With initial substantial blood pressure elevation, as well as head bleed, the patient has already had preparation for Cardene drip, after initial hydralazine.   Patient's case discussed with neurosurgery, who has seen and evaluated the patient, defers emergent ventriculostomy. He will return for CTA.  Patient blood pressure consistently 1 four 0/1 4 5  systolic range.   I discussed with our neurology colleagues, neurosurgery colleagues again, given the patient's decrease in functionality, increasing confusion, and pulling at her IV tubing, compromising her ability for receiving appropriate blood pressure control medication. We discussed intubation, and the patient will have this procedure performed now. Family aware. 8:32 PM Decompensation, and to facilitate placement of intraventricular  drain, the patient required intubation. This was discussed the patient, though she was nonsensical.  On subsequent I discussed with her family members, who concurred with indications for the procedure. Procedure performed without complication, see procedure note.   Elderly female presents with acute onset headache, dizziness, vomiting. On arrival the patient is hesitant to open her eyes, move her head, is ill appearing. With substantial blood pressure elevation, there is immediate concern for intracranial process, this is demonstrated on stat CT. After this is performed, the patient had initiation of IV blood pressure medication, which had substantial reduction in her blood pressure. All findings discussed with family multiple times, patient had repeat evaluation several times, and required admission to the ICU for placement of ventriculostomy, after she received intubation in the emergency department.   Final Clinical Impressions(s) / ED Diagnoses  Intracranial hemorrhage  CRITICAL CARE Performed by: Carmin Muskrat Total critical care time: 50 minutes Critical care time was  exclusive of separately billable procedures and treating other patients. Critical care was necessary to treat or prevent imminent or life-threatening deterioration. Critical care was time spent personally by me on the following activities: development of treatment plan with patient and/or surrogate as well as nursing, discussions with consultants, evaluation of patient's response to treatment, examination of patient, obtaining history from patient or surrogate, ordering and performing treatments and interventions, ordering and review of laboratory studies, ordering and review of radiographic studies, pulse oximetry and re-evaluation of patient's condition.    Carmin Muskrat, MD 03/27/18 2038

## 2018-03-27 NOTE — Procedures (Signed)
  NEUROSURGERY PROCEDURE NOTE   PREOP DX:  1. Cerebellar hemorrhage with intraventricular hemorrhage 2. Hydrocephalus  POSTOP DX: Same  PROCEDURE: Right frontal ventriculostomy   SURGEON: Dr. Consuella Lose, MD  ANESTHESIA: IV Sedation (propofol and fentanyl) with Local  EBL: Minimal  SPECIMENS: None  COMPLICATIONS: None  CONDITION: Hemodynamically stable  INDICATIONS: Mrs. Amber Rogers is a 77 y.o. female admitted with cerebellar hemorrhage with intraventricular hemorrhage. She exhibited decline in level of consciousness and worsening confusion while CT and CTA demonstrated some ventriculomegaly. Placement of EVD was therefore indicated. The risks and benefits of the procedure were reviewed in detail with the patient's family. All questions were answered and they provided consent to proceed. P2Y12 assay was checked prior to the procedure and demonstrated a non-therapeutic platelet inhibition indicating normal platelet function.  PROCEDURE IN DETAIL: After consent was obtained from the patient's family, skin of the right frontal scalp was clipped, prepped and draped in the usual sterile fashion.  Scalp was then infiltrated with local anesthetic with epinephrine.  Skin incision was made sharply, and twist drill burr hole was made.  The dura was then incised, and the ventricular catheter was passed in one attempt into the right lateral ventricle.  Good CSF flow was obtained.  The catheter was then tunneled subcutaneously and connected to a drainage system and the skin incision closed.  The drain was then secured in place and the [place] system was opened to drain at 44mmHg.  FINDINGS: 1. Opening pressure 10-15cmH2O 2. Clear CSF

## 2018-03-28 ENCOUNTER — Inpatient Hospital Stay (HOSPITAL_COMMUNITY): Payer: Medicare Other

## 2018-03-28 ENCOUNTER — Other Ambulatory Visit: Payer: Self-pay

## 2018-03-28 DIAGNOSIS — G919 Hydrocephalus, unspecified: Secondary | ICD-10-CM

## 2018-03-28 DIAGNOSIS — G936 Cerebral edema: Secondary | ICD-10-CM

## 2018-03-28 LAB — GLUCOSE, CAPILLARY
GLUCOSE-CAPILLARY: 104 mg/dL — AB (ref 70–99)
GLUCOSE-CAPILLARY: 114 mg/dL — AB (ref 70–99)
GLUCOSE-CAPILLARY: 134 mg/dL — AB (ref 70–99)
Glucose-Capillary: 115 mg/dL — ABNORMAL HIGH (ref 70–99)
Glucose-Capillary: 135 mg/dL — ABNORMAL HIGH (ref 70–99)
Glucose-Capillary: 139 mg/dL — ABNORMAL HIGH (ref 70–99)

## 2018-03-28 LAB — POCT I-STAT 3, ART BLOOD GAS (G3+)
ACID-BASE DEFICIT: 2 mmol/L (ref 0.0–2.0)
BICARBONATE: 23.5 mmol/L (ref 20.0–28.0)
O2 SAT: 98 %
Patient temperature: 98.2
TCO2: 25 mmol/L (ref 22–32)
pCO2 arterial: 39.7 mmHg (ref 32.0–48.0)
pH, Arterial: 7.379 (ref 7.350–7.450)
pO2, Arterial: 109 mmHg — ABNORMAL HIGH (ref 83.0–108.0)

## 2018-03-28 LAB — BASIC METABOLIC PANEL
ANION GAP: 9 (ref 5–15)
BUN: 21 mg/dL (ref 8–23)
CO2: 24 mmol/L (ref 22–32)
Calcium: 8.7 mg/dL — ABNORMAL LOW (ref 8.9–10.3)
Chloride: 106 mmol/L (ref 98–111)
Creatinine, Ser: 1.33 mg/dL — ABNORMAL HIGH (ref 0.44–1.00)
GFR calc Af Amer: 44 mL/min — ABNORMAL LOW (ref >60)
GFR, EST NON AFRICAN AMERICAN: 38 mL/min — AB (ref >60)
GLUCOSE: 114 mg/dL — AB (ref 70–99)
POTASSIUM: 3.8 mmol/L (ref 3.5–5.1)
Sodium: 139 mmol/L (ref 135–145)

## 2018-03-28 LAB — CBC
HCT: 34.5 % — ABNORMAL LOW (ref 36.0–46.0)
Hemoglobin: 10.7 g/dL — ABNORMAL LOW (ref 12.0–15.0)
MCH: 27 pg (ref 26.0–34.0)
MCHC: 31 g/dL (ref 30.0–36.0)
MCV: 87.1 fL (ref 78.0–100.0)
PLATELETS: 333 10*3/uL (ref 150–400)
RBC: 3.96 MIL/uL (ref 3.87–5.11)
RDW: 15.7 % — AB (ref 11.5–15.5)
WBC: 14 10*3/uL — AB (ref 4.0–10.5)

## 2018-03-28 LAB — MAGNESIUM
Magnesium: 1.9 mg/dL (ref 1.7–2.4)
Magnesium: 2 mg/dL (ref 1.7–2.4)
Magnesium: 1.9 mg/dL (ref 1.7–2.4)

## 2018-03-28 LAB — MRSA PCR SCREENING: MRSA BY PCR: NEGATIVE

## 2018-03-28 LAB — PHOSPHORUS
PHOSPHORUS: 3.5 mg/dL (ref 2.5–4.6)
Phosphorus: 3.4 mg/dL (ref 2.5–4.6)
Phosphorus: 3.6 mg/dL (ref 2.5–4.6)

## 2018-03-28 MED ORDER — PREDNISONE 5 MG PO TABS
5.0000 mg | ORAL_TABLET | Freq: Every day | ORAL | Status: DC
  Administered 2018-03-28: 5 mg
  Filled 2018-03-28: qty 1

## 2018-03-28 MED ORDER — INSULIN ASPART 100 UNIT/ML ~~LOC~~ SOLN
2.0000 [IU] | SUBCUTANEOUS | Status: DC
  Administered 2018-03-28 – 2018-03-30 (×7): 2 [IU] via SUBCUTANEOUS
  Administered 2018-03-30: 4 [IU] via SUBCUTANEOUS
  Administered 2018-03-30 – 2018-04-01 (×9): 2 [IU] via SUBCUTANEOUS
  Administered 2018-04-01: 4 [IU] via SUBCUTANEOUS
  Administered 2018-04-01 – 2018-04-04 (×9): 2 [IU] via SUBCUTANEOUS
  Administered 2018-04-04 (×2): 4 [IU] via SUBCUTANEOUS
  Administered 2018-04-04 – 2018-04-06 (×10): 2 [IU] via SUBCUTANEOUS
  Administered 2018-04-06: 4 [IU] via SUBCUTANEOUS
  Administered 2018-04-07 – 2018-04-10 (×11): 2 [IU] via SUBCUTANEOUS
  Administered 2018-04-12: 4 [IU] via SUBCUTANEOUS
  Administered 2018-04-13 (×2): 2 [IU] via SUBCUTANEOUS
  Administered 2018-04-13: 4 [IU] via SUBCUTANEOUS
  Administered 2018-04-14 (×3): 2 [IU] via SUBCUTANEOUS

## 2018-03-28 MED ORDER — AMLODIPINE BESYLATE 10 MG PO TABS
10.0000 mg | ORAL_TABLET | Freq: Every day | ORAL | Status: DC
  Administered 2018-03-28 – 2018-04-24 (×26): 10 mg
  Filled 2018-03-28 (×26): qty 1

## 2018-03-28 MED ORDER — PREDNISONE 5 MG PO TABS
5.0000 mg | ORAL_TABLET | Freq: Every day | ORAL | Status: DC
  Administered 2018-03-28 – 2018-04-03 (×7): 5 mg via ORAL
  Filled 2018-03-28 (×7): qty 1

## 2018-03-28 MED ORDER — INSULIN ASPART 100 UNIT/ML ~~LOC~~ SOLN: 1.0000 [IU] | SUBCUTANEOUS | Status: DC

## 2018-03-28 MED ORDER — VITAL HIGH PROTEIN PO LIQD
1000.0000 mL | ORAL | Status: DC
  Administered 2018-03-28 – 2018-04-03 (×7): 1000 mL
  Filled 2018-03-28 (×5): qty 1000

## 2018-03-28 NOTE — Progress Notes (Addendum)
STROKE TEAM PROGRESS NOTE  HPI: Amber Rogers is a 77 y.o. female with a history of CHF, HTN, hyperlipidemia who presents with cerebellar hemorrhage with intraventricular extension.  She was in her normal state of health until around noon when she had acute onset of nausea vomiting, dizziness.  She also endorsed headache.  She presented to the emergency department where CT scan was done which showed a hematoma in the left cerebellum with what appeared to be early hydrocephalus.Neurosurgery was consulted, favor holding off on emergent ventriculostomy given the fact that she is on dual therapy.  However, per her husband she has continued to decline over the past few hours and is worse now than she was even an hour ago with Dr. Kathyrn Sheriff evaluated her.and placed a ventriculostomy catheter LKW: Noon tpa given?: no, ICH ICH Score: 2  INTERVAL HISTORY Her daughters are at the bedside.  Patient has remained stable since intubation and ventriculostomy placement yesterday.  Her blood pressure is variable this morning with Cardene on and off.  Will resume home medicines and hope it will stabilize.  Continue aggressive care.ventriculostomy catheter is not draining a lot. Blood pressure is controlled patient remains intermittently agitated requiring sedation  Vitals:   03/28/18 0700 03/28/18 0805 03/28/18 0813 03/28/18 0915  BP: 134/70     Pulse: 64     Resp: 15     Temp:  99 F (37.2 C)    TempSrc:  Oral    SpO2: 100% 98% 99% 100%  Weight:      Height:        CBC:  Recent Labs  Lab 03/27/18 1403 03/28/18 0645  WBC 14.9* 14.0*  NEUTROABS 12.2*  --   HGB 11.6* 10.7*  HCT 37.5 34.5*  MCV 86.4 87.1  PLT 308 952    Basic Metabolic Panel:  Recent Labs  Lab 03/27/18 1403 03/28/18 0645  NA 138 139  K 4.2 3.8  CL 105 106  CO2 23 24  GLUCOSE 178* 114*  BUN 22 21  CREATININE 1.36* 1.33*  CALCIUM 8.8* 8.7*  MG  --  1.9  PHOS  --  3.4   Lipid Panel:     Component Value Date/Time    CHOL 244 (H) 04/14/2015 0500   TRIG 53 03/27/2018 2253   HDL 104 04/14/2015 0500   CHOLHDL 2.3 04/14/2015 0500   VLDL 11 04/14/2015 0500   LDLCALC 129 (H) 04/14/2015 0500   HgbA1c:  Lab Results  Component Value Date   HGBA1C 6.0 (H) 04/13/2015   Urine Drug Screen: No results found for: LABOPIA, COCAINSCRNUR, LABBENZ, AMPHETMU, THCU, LABBARB  Alcohol Level     Component Value Date/Time   ETH <10 03/27/2018 1354    IMAGING Ct Angio Head W Or Wo Contrast  Result Date: 03/27/2018 CLINICAL DATA:  77 y/o  F; nausea, vomiting, dizziness. EXAM: CT ANGIOGRAPHY HEAD TECHNIQUE: Multidetector CT imaging of the head was performed using the standard protocol during bolus administration of intravenous contrast. Multiplanar CT image reconstructions and MIPs were obtained to evaluate the vascular anatomy. CONTRAST:  25mL ISOVUE-370 IOPAMIDOL (ISOVUE-370) INJECTION 76% COMPARISON:  03/27/2018 CT head. FINDINGS: CTA HEAD Anterior circulation: No significant stenosis, proximal occlusion, aneurysm, or vascular malformation. Calcified plaque of the carotid siphons with mild bilateral distal cavernous stenosis. Posterior circulation: No significant stenosis, proximal occlusion, aneurysm, or vascular malformation. Calcified plaque of the vertebral arteries with mild stenosis downstream to the PICA origins. Venous sinuses: As permitted by contrast timing, patent. Anatomic variants: Patent  anterior communicating artery. No posterior communicating artery identified, likely hypoplastic or absent. Delayed phase: No abnormal intracranial enhancement. IMPRESSION: 1. No vascular malformation or abnormal enhancement of the brain identified. 2. Stable cerebellar and intraventricular hemorrhage. 3. Patent anterior and posterior intracranial circulation. No large vessel occlusion, aneurysm, or significant stenosis. 4. Calcific atherosclerosis of the carotid and vertebral arteries with mild stenosis. Electronically Signed   By:  Kristine Garbe M.D.   On: 03/27/2018 17:52   Ct Head Wo Contrast  Result Date: 03/27/2018 CLINICAL DATA:  Sudden onset nausea and vomiting EXAM: CT HEAD WITHOUT CONTRAST TECHNIQUE: Contiguous axial images were obtained from the base of the skull through the vertex without intravenous contrast. COMPARISON:  04/16/2015 FINDINGS: Brain: 20 mm hematoma in the central left cerebellum with extension into the fourth ventricle sella projecting LVAD remote of Luschka continuing superiorly through the cerebral aqua duct and into the third and lateral ventricles. There is lateral ventriculomegaly when compared to 2016. Periventricular low-density about the atria could be small-vessel disease and/or transependymal CSF flow when compared with prior. 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 Vanita Panda was stabilizing a patient. The findings were already known to him when we reviewed the images by telephone at 2:45 p.m. 03/27/2018. IMPRESSION: 2 cm hematoma in the deep left cerebellum with fourth ventricular extension extending into the third/lateral ventricles and into the dorsal cervical canal. There is mild hydrocephalus at the lateral ventricles when compared to 2016. A deep cerebellar origin favors hypertensive etiology. Electronically Signed   By: Monte Fantasia M.D.   On: 03/27/2018 14:49   Dg Chest Port 1 View  Result Date: 03/28/2018 CLINICAL DATA:  Respiratory failure. EXAM: PORTABLE CHEST 1 VIEW COMPARISON:  03/27/2018. FINDINGS: Endotracheal tube tip noted at the tracheal bifurcation 1 cm above the lower portion of the carina. Proximal repositioning of approximately 3 cm suggested. NG tube noted with its tip below left hemidiaphragm. Stable cardiomegaly. Low lung volumes. Small left pleural effusion cannot be excluded. No pneumothorax. IMPRESSION: 1. Endotracheal tube tip noted at the  tracheal bifurcation 1 cm above the lower portion of the carina, proximal repositioning of approximately 3 cm suggested. NG tube noted with its tip below left hemidiaphragm. 2.  Stable cardiomegaly.  No pulmonary venous congestion. 3.  Low lung volumes.  Small left pleural effusion. Critical Value/emergent results were called by telephone at the time of interpretation on 03/28/2018 at 6:49 am to nurse Roselyn Reef, who verbally acknowledged these results. Electronically Signed   By: Marcello Moores  Register   On: 03/28/2018 06:50   Dg Chest Portable 1 View  Result Date: 03/27/2018 CLINICAL DATA:  Post intubation EXAM: PORTABLE CHEST 1 VIEW COMPARISON:  Portable exam 2027 hours compared to 09/28/2016 FINDINGS: Tip of endotracheal tube projects 1.5 cm above carina. Nasogastric tube extends into stomach. Minimal enlargement of cardiac silhouette. Mediastinal contours and pulmonary vascularity normal. Lungs clear. No pleural effusion or pneumothorax. Bones demineralized. IMPRESSION: Minimal enlargement of cardiac silhouette. Satisfactory tube positions as above. Electronically Signed   By: Lavonia Dana M.D.   On: 03/27/2018 20:45    PHYSICAL EXAM  elderly lady who is intubated and sedated. She has a right frontal ventriculostomy catheter. . Afebrile. Head is nontraumatic. Neck is supple without bruit.    Cardiac exam no murmur or gallop. Lungs are clear to auscultation. Distal pulses are well felt. Neurological Exam :  She is  stuporose. She barely opens  eyes to sternal rub does not follow commands. Pupils are small 2 mm pinpoint and reactive. Corneal reflexes are present. She has some respiratory effort above ventilator. She has a weak cough and gag. Motor system exam she is able to move all 4 extremities against gravity but does not follow commands consistently. Deep tendon reflexes are symmetric. Sensation is intact bilaterally. Plantars are downgoing. ASSESSMENT/PLAN Amber Rogers is a 77 y.o. female with history of  COPD, CHF, HTN, HLD, CKD III, CAD s/p stent presenting with sudden onset nausea, vomiting and dizziness.   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  Mild hydrocephalus lateral ventricles.    CTA head no vascular malformation or abnormal enhancement.  Stable cerebellar and IVH.  No LVO.  Intracranial atherosclerosis.  Repeat CT head in am   Carotid Doppler  08/2017 R ICA stenosis 50-69%, L ICA < 50%  Repeat carotid doppler pending   2D Echo  Not ordered  LDL NA  HgbA1c NA  SCDs for VTE prophylaxis Diet Order            Diet NPO time specified  Diet effective now               aspirin 81 mg daily and Brilinta 90 mg twice daily prior to admission, now on P2Y12 assay was checked prior to the EVD procedure and demonstrated a non-therapeutic platelet inhibition indicating normal platelet function  Therapy recommendations:  Pending. Continue BR for now  Disposition:  pending   Continue ICU level care  R frontal ventric placed 8/22  Functioning  Minimal OP  Neurosurgeon on board Kathyrn Sheriff)  Acute Hypoxic Respiratory failure  Secondary to stroke  Intubated in the ED with neuro decline   On propofol,  Fentanyl now off   CCM on board  Anticipate extubation over the weekend if she improves  Hypertensive Emergency  BP as high as 209/158 upon arrival  Now on cardene  Home Meds: Norvasc 10, resumed  SBP variable from 89-146  SBP goal < 140  Hyperlipidemia  Home meds:  Lipitor 80  LDL NA  Lipitor on hold for now  Resume statin once extubated and PO or tube access  Plan continue statin at time of d/c  Other Stroke Risk Factors  Advanced  age  Former Cigarette smoker, quit 2 years ago  Coronary artery disease s/p stent  Chronic Congestive heart failure  Other Active Problems  Hx vertigo   COPD - resumed home prednisone per tube  Hospital day # [place], MSN, APRN, ANVP-BC, AGPCNP-BC [facility] Stroke Nurse [place] for Schedule & Pager information 03/28/2018 9:40 AM  I have personally examined this patient, reviewed notes, independently viewed imaging studies, participated in medical decision making and plan of care.ROS completed by me personally and pertinent positives fully documented  I have made any additions or clarifications directly to the above note. Agree with note above.  She presented with nausea vomiting dizziness and had declining mental status requiring intubation as well as emergent ventriculostomy. She has a left cerebellar hematoma with the interventricular extension. She needs strict blood pressure control and close neurological monitoring. Appreciate neurosurgery and pulmonary critical care help. Recommend repeat CT  scan of the head tomorrow and if hemorrhage is stable may consider extubation if tolerated. I had a long discussion with the daughter at the bedside about her prognosis, plan of care and answered questions. Discussed with Dr. Vaughan Browner critical care medicine.This patient is critically ill and at significant risk of neurological worsening, death and care requires constant monitoring of vital signs, hemodynamics,respiratory and cardiac monitoring, extensive review of multiple databases, frequent neurological assessment, discussion with family, other specialists and medical decision making of high complexity.I have made any additions or clarifications directly to the above note.This critical care time does not reflect procedure time, or teaching time or supervisory time of PA/NP/Med Resident etc but could involve care discussion time.  I spent 50 minutes of  neurocritical care time  in the care of  this patient.      Antony Contras, MD Medical Director [facility] Pager: [phone] 03/28/2018 5:12 PM  To contact Stroke Continuity provider, please refer to [email or web address] After hours, contact General Neurology

## 2018-03-28 NOTE — Progress Notes (Signed)
Initial Nutrition Assessment  DOCUMENTATION CODES:   Not applicable  INTERVENTION:   Tube Feeding:  Vital High Protein @ 50 ml/hr Provides 1200 kcals, 106 g of protein and 1008 mL of free water Meets 100% of protein needs; with propofol at current rate, meeting 95% energy needs  NUTRITION DIAGNOSIS:   Inadequate oral intake related to acute illness as evidenced by NPO status.  GOAL:   Patient will meet greater than or equal to 90% of their needs  MONITOR:   Vent status, Labs, Weight trends, TF tolerance  REASON FOR ASSESSMENT:   Ventilator    ASSESSMENT:   77 yo female admitted with deep left cerebellar hemorrhage with 3rd and 4th ventricle extension and mild hydrocephalus. Pt required intubation for airway protection. Pt with hx of HTN, CKD  8/22 Right frontal ventriculostomy  Patient is currently intubated on ventilator support MV: 5.9L/min Temp (24hrs), Avg:98.9 F (37.2 C), Min:98.1 F (36.7 C), Max:99.5 F (37.5 C)  Propofol: 2.4 ml/hr  Daughter at bedside and reports eating well PTA with no significant changes in weight.    Labs: reviewed Meds: prednisone  NUTRITION - FOCUSED PHYSICAL EXAM:    Most Recent Value  Orbital Region  No depletion  Upper Arm Region  No depletion  Thoracic and Lumbar Region  No depletion  Buccal Region  No depletion  Temple Region  No depletion  Clavicle Bone Region  No depletion  Clavicle and Acromion Bone Region  No depletion  Scapular Bone Region  No depletion  Dorsal Hand  No depletion  Patellar Region  No depletion  Anterior Thigh Region  No depletion  Posterior Calf Region  No depletion  Edema (RD Assessment)  None       Diet Order:   Diet Order            Diet NPO time specified  Diet effective now              EDUCATION NEEDS:   No education needs have been identified at this time  Skin:  Skin Assessment: Reviewed RN Assessment  Last BM:  no documented BM  Height:   Ht Readings from Last  1 Encounters:  03/27/18 5\' 6"  (1.676 m)    Weight:   Wt Readings from Last 1 Encounters:  03/28/18 65.7 kg    Ideal Body Weight:  59.1 kg  BMI:  Body mass index is 23.38 kg/m.  Estimated Nutritional Needs:   Kcal:  1334 kcals   Protein:  80-119 g  Fluid:  >/= 1.6 L   Kerman Passey MS, RD, LDN, CNSC [phone] Pager  [phone] Weekend/On-Call Pager

## 2018-03-28 NOTE — Consult Note (Addendum)
PULMONARY / CRITICAL CARE MEDICINE   Name: Amber Rogers MRN: [id number] DOB: Oct 14, 1940    ADMISSION DATE:  03/27/2018 CONSULTATION DATE:  03/27/2018  REFERRING MD:  Dr. Leonel Ramsay  CHIEF COMPLAINT:  ICH  HISTORY OF PRESENT ILLNESS:  Patient is encephalopathic and/or intubated. Therefore history has been obtained from chart review. 77 year old female with PMH as below, which is significant for HTN, CKD, CHF (EF 55% and grade 2 DD in 2016), and COPD. She presented to [facility] ED 8/22 with complaints of headache, nausea, and vomiting with onset of symptoms around 1200. She was found to be hypertensive. CT scan demonstrated hematoma in the left cerebellum with early hydrocephalus. Initially no neurosurgical intervention was indicated due to her reasonable mental status and dual antiplatelet regimen, however, her condition continued to decline and she required intubation. She was then deemed a candidate for EVD placement. PCCM consulted for medical/vent/BP management.  PAST MEDICAL HISTORY :  She  has a past medical history of Asthma, CHF (congestive heart failure) ([place]), Chronic kidney disease (CKD), active medical management without dialysis, stage 3 (moderate) ([place]), COPD (chronic obstructive pulmonary disease) ([place]), Coronary artery disease, High cholesterol, Hypertension, Myocardial infarction ([place]) (04/2015), Osteoarthritis, and Pneumonia (1996).  PAST SURGICAL HISTORY: She  has a past surgical history that includes Total hip arthroplasty (Right, 02/10/2013); Knee arthroscopy (Right, 2004); Joint replacement; Cardiac catheterization (N/A, 04/14/2015); Cardiac catheterization (N/A, 04/14/2015); and Coronary angioplasty.  No Known Allergies  No current facility-administered medications on file prior to encounter.    Current Outpatient Medications on File Prior to Encounter  Medication Sig  . amLODipine (NORVASC) 10 MG tablet Take 10 mg by mouth daily.  Marland Kitchen aspirin 81 MG chewable tablet Chew 1  tablet (81 mg total) by mouth daily.  Marland Kitchen atorvastatin (LIPITOR) 80 MG tablet Take 1 tablet (80 mg total) by mouth daily at 6 PM. (Patient not taking: Reported on 03/27/2018)  . nitroGLYCERIN (NITROSTAT) 0.4 MG SL tablet Place 1 tablet (0.4 mg total) under the tongue every 5 (five) minutes x 3 doses as needed for chest pain.  . predniSONE (DELTASONE) 5 MG tablet Take 5 mg by mouth daily.  . SYMBICORT 160-4.5 MCG/ACT inhaler Inhale 2 puffs into the lungs 2 (two) times daily.  . ticagrelor (BRILINTA) 90 MG TABS tablet Take 1 tablet (90 mg total) by mouth 2 (two) times daily.    FAMILY HISTORY:  Her family history is not on file.  SOCIAL HISTORY: She  reports that she quit smoking about 2 years ago. Her smoking use included cigarettes. She has a 20.00 pack-year smoking history. She has never used smokeless tobacco. She reports that she does not drink alcohol or use drugs.  REVIEW OF SYSTEMS:   Unable to obtain as patient is sedated  SUBJECTIVE:  No acute events overnight.  VITAL SIGNS: BP 134/70   Pulse 64   Temp 99 F (37.2 C) (Oral)   Resp 15   Ht 5\' 6"  (1.676 m)   Wt 65.7 kg   SpO2 100%   BMI 23.38 kg/m   HEMODYNAMICS:    VENTILATOR SETTINGS: Vent Mode: PSV;CPAP FiO2 (%):  [30 %-100 %] 30 % Set Rate:  [15 bmp] 15 bmp Vt Set:  [470 mL] 470 mL PEEP:  [5 cmH20] 5 cmH20 Pressure Support:  [5 cmH20-8 cmH20] 8 cmH20 Plateau Pressure:  [14 cmH20-15 cmH20] 14 cmH20  INTAKE / OUTPUT: I/O last 3 completed shifts: In: 1195.8 [I.V.:1145.8; IV Piggyback:50] Out: 379 [Urine:360; Drains:11]  PHYSICAL  EXAMINATION: Gen:      No acute distress HEENT:  EOMI, sclera anicteric Neck:     No masses; no thyromegaly, ET tube Lungs:    Clear to auscultation bilaterally; normal respiratory effort CV:         Regular rate and rhythm; no murmurs Abd:      + bowel sounds; soft, non-tender; no palpable masses, no distension Ext:    No edema; adequate peripheral perfusion Skin:      Warm and  dry; no rash Neuro: Sedated, unresponsive, EVD drain  LABS:  BMET Recent Labs  Lab 03/27/18 1403 03/28/18 0645  NA 138 139  K 4.2 3.8  CL 105 106  CO2 23 24  BUN 22 21  CREATININE 1.36* 1.33*  GLUCOSE 178* 114*    Electrolytes Recent Labs  Lab 03/27/18 1403 03/28/18 0645  CALCIUM 8.8* 8.7*  MG  --  1.9  PHOS  --  3.4    CBC Recent Labs  Lab 03/27/18 1403 03/28/18 0645  WBC 14.9* 14.0*  HGB 11.6* 10.7*  HCT 37.5 34.5*  PLT 308 333    Coag's Recent Labs  Lab 03/27/18 2253  INR 1.06    Sepsis Markers No results for input(s): LATICACIDVEN, PROCALCITON, O2SATVEN in the last 168 hours.  ABG Recent Labs  Lab 03/27/18 2344 03/28/18 0442  PHART 7.396 7.379  PCO2ART 39.9 39.7  PO2ART 227.0* 109.0*    Liver Enzymes Recent Labs  Lab 03/27/18 1403  AST 25  ALT 16  ALKPHOS 82  BILITOT 1.2  ALBUMIN 3.4*    Cardiac Enzymes Recent Labs  Lab 03/27/18 1403  TROPONINI <0.03    Glucose Recent Labs  Lab 03/28/18 0007 03/28/18 0638  GLUCAP 104* 114*    Imaging Ct Angio Head W Or Wo Contrast  Result Date: 03/27/2018 CLINICAL DATA:  77 y/o  F; nausea, vomiting, dizziness. EXAM: CT ANGIOGRAPHY HEAD TECHNIQUE: Multidetector CT imaging of the head was performed using the standard protocol during bolus administration of intravenous contrast. Multiplanar CT image reconstructions and MIPs were obtained to evaluate the vascular anatomy. CONTRAST:  79mL ISOVUE-370 IOPAMIDOL (ISOVUE-370) INJECTION 76% COMPARISON:  03/27/2018 CT head. FINDINGS: CTA HEAD Anterior circulation: No significant stenosis, proximal occlusion, aneurysm, or vascular malformation. Calcified plaque of the carotid siphons with mild bilateral distal cavernous stenosis. Posterior circulation: No significant stenosis, proximal occlusion, aneurysm, or vascular malformation. Calcified plaque of the vertebral arteries with mild stenosis downstream to the PICA origins. Venous sinuses: As permitted  by contrast timing, patent. Anatomic variants: Patent anterior communicating artery. No posterior communicating artery identified, likely hypoplastic or absent. Delayed phase: No abnormal intracranial enhancement. IMPRESSION: 1. No vascular malformation or abnormal enhancement of the brain identified. 2. Stable cerebellar and intraventricular hemorrhage. 3. Patent anterior and posterior intracranial circulation. No large vessel occlusion, aneurysm, or significant stenosis. 4. Calcific atherosclerosis of the carotid and vertebral arteries with mild stenosis. Electronically Signed   By: Kristine Garbe M.D.   On: 03/27/2018 17:52   Ct Head Wo Contrast  Result Date: 03/27/2018 CLINICAL DATA:  Sudden onset nausea and vomiting EXAM: CT HEAD WITHOUT CONTRAST TECHNIQUE: Contiguous axial images were obtained from the base of the skull through the vertex without intravenous contrast. COMPARISON:  04/16/2015 FINDINGS: Brain: 20 mm hematoma in the central left cerebellum with extension into the fourth ventricle sella projecting LVAD remote of [place] continuing superiorly through the cerebral aqua duct and into the third and lateral ventricles. There is lateral ventriculomegaly when compared to 2016.  Periventricular low-density about the atria could be small-vessel disease and/or transependymal CSF flow when compared with prior. 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 Vanita Panda was stabilizing a patient. The findings were already known to him when we reviewed the images by telephone at 2:45 p.m. 03/27/2018. IMPRESSION: 2 cm hematoma in the deep left cerebellum with fourth ventricular extension extending into the third/lateral ventricles and into the dorsal cervical canal. There is mild hydrocephalus at the lateral ventricles when compared to 2016. A deep cerebellar origin favors hypertensive  etiology. Electronically Signed   By: Monte Fantasia M.D.   On: 03/27/2018 14:49   Dg Chest Port 1 View  Result Date: 03/28/2018 CLINICAL DATA:  Respiratory failure. EXAM: PORTABLE CHEST 1 VIEW COMPARISON:  03/27/2018. FINDINGS: Endotracheal tube tip noted at the tracheal bifurcation 1 cm above the lower portion of the carina. Proximal repositioning of approximately 3 cm suggested. NG tube noted with its tip below left hemidiaphragm. Stable cardiomegaly. Low lung volumes. Small left pleural effusion cannot be excluded. No pneumothorax. IMPRESSION: 1. Endotracheal tube tip noted at the tracheal bifurcation 1 cm above the lower portion of the carina, proximal repositioning of approximately 3 cm suggested. NG tube noted with its tip below left hemidiaphragm. 2.  Stable cardiomegaly.  No pulmonary venous congestion. 3.  Low lung volumes.  Small left pleural effusion. Critical Value/emergent results were called by telephone at the time of interpretation on 03/28/2018 at 6:49 am to nurse Roselyn Reef, who verbally acknowledged these results. Electronically Signed   By: Marcello Moores  Register   On: 03/28/2018 06:50   Dg Chest Portable 1 View  Result Date: 03/27/2018 CLINICAL DATA:  Post intubation EXAM: PORTABLE CHEST 1 VIEW COMPARISON:  Portable exam 2027 hours compared to 09/28/2016 FINDINGS: Tip of endotracheal tube projects 1.5 cm above carina. Nasogastric tube extends into stomach. Minimal enlargement of cardiac silhouette. Mediastinal contours and pulmonary vascularity normal. Lungs clear. No pleural effusion or pneumothorax. Bones demineralized. IMPRESSION: Minimal enlargement of cardiac silhouette. Satisfactory tube positions as above. Electronically Signed   By: Lavonia Dana M.D.   On: 03/27/2018 20:45     STUDIES:  CT head 8/22 > 2 cm hematoma in the deep left cerebellum with fourth ventricular extension extending into the third/lateral ventricles and into the dorsal cervical canal. There is mild hydrocephalus at  the lateral ventricles when compared to 2016. A deep cerebellar origin favors hypertensive etiology. CT angio head 8/22 > No vascular malformation or abnormal enhancement of the brain identified. Stable cerebellar and intraventricular hemorrhage. Patent anterior and posterior intracranial circulation. No large vessel occlusion, aneurysm, or significant stenosis. Calcific atherosclerosis of the carotid and vertebral arteries with mild stenosis.  CULTURES: None   ANTIBIOTICS: None   SIGNIFICANT EVENTS: 8/22 > admit, EVD placed.   LINES/TUBES: ETT 8/22 > EVD 8/22 >  ASSESSMENT / PLAN: Intracranial hemorrhage 2cm L cerebellar hematoma with intraventricular extension. Secondary to hypertensive crisis. EVD placed by neurosurgery 8/22.  Management per neurology and neurosurgery SBP goal less than 160 Holding home aspirin, Brilinta  Acute Hypoxic Respiratory failure: Intubated for airway protection in the setting of intracranial hemorrhage Continue vent support Pressure support weans as tolerated.  No plans on extubation today Discussed with Dr. Leonie Man, Neurology.  Plan is to get CT head tomorrow to reevaluate Likely extubation over the weekend if she improves Remains full code.   COPD without acute exacerbation Continue scheduled nebulizers  Osteoarthritis  On chronic prednisone of 5 mg.  Continue for now.  Chronic CHF Telemetry monitoring.  Holding home amlodipine.  The patient is critically ill with multiple organ system failure and requires high complexity decision making for assessment and support, frequent evaluation and titration of therapies, advanced monitoring, review of radiographic studies and interpretation of complex data.   Critical Care Time devoted to patient care services, exclusive of separately billable procedures, described in this note is 35 minutes.   Marshell Garfinkel MD [place] 03/28/2018, 10:09 AM

## 2018-03-28 NOTE — Progress Notes (Signed)
SLP Cancellation Note  Patient Details Name: JENNIKA RINGGOLD MRN: [id number] DOB: [birth year]   Cancelled treatment:       Reason Eval/Treat Not Completed: Medical issues which prohibited therapy.  Deneise Lever, [place], [place] Speech-Language Pathologist [place] 03/28/2018, 8:07 AM

## 2018-03-28 NOTE — Progress Notes (Signed)
Neurosurgery Progress Note  No issues overnight. Remains intubated. No longer on sedation.  EXAM:  BP 134/70   Pulse 64   Temp 98.1 F (36.7 C) (Oral)   Resp 15   Ht 5\' 6"  (1.676 m)   Wt 65.7 kg   SpO2 99%   BMI 23.38 kg/m   Intubated PERRL Not following commands currently, although nursing reports she was earlier Has been moving all extremities EVD with clear bloody drainage. Fluctuating meniscus. Minimal output.  IMPRESSION/PLAN 77 y.o. female with cerebellar hemorrhage with intraventricular extension, s/p EVD placement yesterday. - EVD remains functioning, although with minimal output - Continue management per PCCM and Neuro

## 2018-03-28 NOTE — Progress Notes (Signed)
OT Cancellation Note  Patient Details Name: Amber Rogers MRN: [id number] DOB: 10/10/1940   Cancelled Treatment:    Reason Eval/Treat Not Completed: Patient not medically ready(Bedrest) OT order received and appreciated however this conflicts with current bedrest order set. Please increase activity tolerance as appropriate and remove bedrest from orders. . Please contact OT at [phone] if bed rest order is discontinued. OT will hold evaluation at this time and will check back as time allows pending increased activity orders.   Parke Poisson B 03/28/2018, 9:12 AM

## 2018-03-28 NOTE — Progress Notes (Addendum)
PT Cancellation Note  Patient Details Name: Amber Rogers MRN: [id number] DOB: 03/30/1941   Cancelled Treatment:    Reason Eval/Treat Not Completed: Active bedrest order   Thanks,    Wells Guiles B. Shelina Luo, PT, DPT [phone]   03/28/2018, 9:25 AM   03/28/2018 @1333 - I spoke with RN as pt is no longer on bedrest, however, she is seated weaning on the vent.  PT will check back tomorrow.  Thanks,  Barbarann Ehlers. [place], [place], DPT [phone]

## 2018-03-29 ENCOUNTER — Inpatient Hospital Stay (HOSPITAL_COMMUNITY): Payer: Medicare Other

## 2018-03-29 ENCOUNTER — Other Ambulatory Visit (HOSPITAL_COMMUNITY): Payer: Medicare Other

## 2018-03-29 DIAGNOSIS — G936 Cerebral edema: Secondary | ICD-10-CM

## 2018-03-29 DIAGNOSIS — I10 Essential (primary) hypertension: Secondary | ICD-10-CM

## 2018-03-29 DIAGNOSIS — J9601 Acute respiratory failure with hypoxia: Secondary | ICD-10-CM

## 2018-03-29 DIAGNOSIS — I615 Nontraumatic intracerebral hemorrhage, intraventricular: Secondary | ICD-10-CM

## 2018-03-29 LAB — BASIC METABOLIC PANEL
Anion gap: 8 (ref 5–15)
BUN: 27 mg/dL — AB (ref 8–23)
CHLORIDE: 107 mmol/L (ref 98–111)
CO2: 23 mmol/L (ref 22–32)
CREATININE: 1.35 mg/dL — AB (ref 0.44–1.00)
Calcium: 8.4 mg/dL — ABNORMAL LOW (ref 8.9–10.3)
GFR calc Af Amer: 44 mL/min — ABNORMAL LOW (ref >60)
GFR, EST NON AFRICAN AMERICAN: 38 mL/min — AB (ref >60)
GLUCOSE: 135 mg/dL — AB (ref 70–99)
POTASSIUM: 3.6 mmol/L (ref 3.5–5.1)
SODIUM: 138 mmol/L (ref 135–145)

## 2018-03-29 LAB — CBC
HCT: 31.7 % — ABNORMAL LOW (ref 36.0–46.0)
Hemoglobin: 9.8 g/dL — ABNORMAL LOW (ref 12.0–15.0)
MCH: 26.9 pg (ref 26.0–34.0)
MCHC: 30.9 g/dL (ref 30.0–36.0)
MCV: 87.1 fL (ref 78.0–100.0)
PLATELETS: 282 10*3/uL (ref 150–400)
RBC: 3.64 MIL/uL — AB (ref 3.87–5.11)
RDW: 15.7 % — ABNORMAL HIGH (ref 11.5–15.5)
WBC: 14.3 10*3/uL — ABNORMAL HIGH (ref 4.0–10.5)

## 2018-03-29 LAB — MAGNESIUM
MAGNESIUM: 2 mg/dL (ref 1.7–2.4)
Magnesium: 2.2 mg/dL (ref 1.7–2.4)

## 2018-03-29 LAB — RAPID URINE DRUG SCREEN, HOSP PERFORMED
AMPHETAMINES: NOT DETECTED
BENZODIAZEPINES: NOT DETECTED
Barbiturates: NOT DETECTED
Cocaine: NOT DETECTED
OPIATES: NOT DETECTED
TETRAHYDROCANNABINOL: NOT DETECTED

## 2018-03-29 LAB — PHOSPHORUS
Phosphorus: 3 mg/dL (ref 2.5–4.6)
Phosphorus: 3.6 mg/dL (ref 2.5–4.6)

## 2018-03-29 LAB — GLUCOSE, CAPILLARY
GLUCOSE-CAPILLARY: 114 mg/dL — AB (ref 70–99)
GLUCOSE-CAPILLARY: 118 mg/dL — AB (ref 70–99)
Glucose-Capillary: 145 mg/dL — ABNORMAL HIGH (ref 70–99)
Glucose-Capillary: 127 mg/dL — ABNORMAL HIGH (ref 70–99)
Glucose-Capillary: 135 mg/dL — ABNORMAL HIGH (ref 70–99)

## 2018-03-29 MED ORDER — POTASSIUM CHLORIDE 20 MEQ/15ML (10%) PO SOLN
40.0000 meq | Freq: Three times a day (TID) | ORAL | Status: AC
  Administered 2018-03-29 (×2): 40 meq
  Filled 2018-03-29 (×2): qty 30

## 2018-03-29 MED ORDER — HEPARIN SODIUM (PORCINE) 5000 UNIT/ML IJ SOLN
5000.0000 [IU] | Freq: Three times a day (TID) | INTRAMUSCULAR | Status: DC
  Administered 2018-03-29 – 2018-04-15 (×48): 5000 [IU] via SUBCUTANEOUS
  Filled 2018-03-29 (×48): qty 1

## 2018-03-29 NOTE — Progress Notes (Signed)
PT Cancellation Note  Patient Details Name: Amber Rogers MRN: [id number] DOB: 17-Aug-1940   Cancelled Treatment:    Reason Eval/Treat Not Completed: Active bedrest order.  Pt remains on bed rest and critically ill.  PT will continue to follow awaiting medical stability and removal of bed rest.   Thanks,    Wells Guiles B. Anthonella Klausner, PT, DPT [phone]   03/29/2018, 9:51 AM

## 2018-03-29 NOTE — Progress Notes (Signed)
STROKE TEAM PROGRESS NOTE   INTERVAL HISTORY Her daughters and son-in-laws are at the bedside.  Patient remained intubated with sedation. Briefly open eyes with painful stimulation. Did not follow any commands. EVD patent, and now pressure set up at [place]. CT repeat yesterday stable ICH and IVH.   Vitals:   03/29/18 0700 03/29/18 0800 03/29/18 0850 03/29/18 0900  BP: (!) 154/66 (!) 143/63  123/60  Pulse: 73 70 (!) 52 (!) 55  Resp: 17 15  15   Temp:  97.6 F (36.4 C)    TempSrc:  Oral    SpO2: 99% 99% 100% 100%  Weight:      Height:        CBC:  Recent Labs  Lab 03/27/18 1403 03/28/18 0645 03/29/18 0558  WBC 14.9* 14.0* 14.3*  NEUTROABS 12.2*  --   --   HGB 11.6* 10.7* 9.8*  HCT 37.5 34.5* 31.7*  MCV 86.4 87.1 87.1  PLT 308 333 321    Basic Metabolic Panel:  Recent Labs  Lab 03/28/18 0645  03/28/18 1702 03/29/18 0558  NA 139  --   --  138  K 3.8  --   --  3.6  CL 106  --   --  107  CO2 24  --   --  23  GLUCOSE 114*  --   --  135*  BUN 21  --   --  27*  CREATININE 1.33*  --   --  1.35*  CALCIUM 8.7*  --   --  8.4*  MG 1.9   < > 1.9 2.0  PHOS 3.4   < > 3.5 3.0   < > = values in this interval not displayed.   Lipid Panel:     Component Value Date/Time   CHOL 244 (H) 04/14/2015 0500   TRIG 53 03/27/2018 2253   HDL 104 04/14/2015 0500   CHOLHDL 2.3 04/14/2015 0500   VLDL 11 04/14/2015 0500   LDLCALC 129 (H) 04/14/2015 0500   HgbA1c:  Lab Results  Component Value Date   HGBA1C 6.0 (H) 04/13/2015   Urine Drug Screen: No results found for: LABOPIA, COCAINSCRNUR, LABBENZ, AMPHETMU, THCU, LABBARB  Alcohol Level     Component Value Date/Time   ETH <10 03/27/2018 1354    IMAGING  Ct Angio Head W Or Wo Contrast 03/27/2018 IMPRESSION:  1. No vascular malformation or abnormal enhancement of the brain identified.  2. Stable cerebellar and intraventricular hemorrhage.  3. Patent anterior and posterior intracranial circulation. No large vessel occlusion,  aneurysm, or significant stenosis.  4. Calcific atherosclerosis of the carotid and vertebral arteries with mild stenosis.    Ct Head Wo Contrast 03/28/2018 IMPRESSION:  1. Stable volume of intracranial hemorrhage within the left cerebellar hemisphere and the ventricular system. Some redistribution of blood products to the lateral ventricle atria and sylvian fissures.  2. No new acute intracranial abnormality.  3. Interval right frontal approach ventriculostomy catheter placement. Mild decrease in hydrocephalus.    Ct Head Wo Contrast 03/27/2018 IMPRESSION:  2 cm hematoma in the deep left cerebellum with fourth ventricular extension extending into the third/lateral ventricles and into the dorsal cervical canal. There is mild hydrocephalus at the lateral ventricles when compared to 2016. A deep cerebellar origin favors hypertensive etiology.   CUS pending  TTE pending  CT repeat pending   PHYSICAL EXAM  elderly lady who is intubated and sedated. She has a right frontal ventriculostomy catheter. . Afebrile. Head is nontraumatic. Neck is supple  without bruit.    Cardiac exam no murmur or gallop. Lungs are clear to auscultation. Distal pulses are well felt. Neurological Exam :  Intubated, eyes closed, on sedation with propofol and fentanyl. She barely opens eyes to sternal rub does not follow commands. Pupils are small 2 mm pinpoint and sluggishly reactive. Corneal reflexes are present. She has some respiratory effort above ventilator. She has a cough and gag. Motor system exam she is able to withdraw all 4 extremities with painful stimuli but does not follow commands or against gravity. Deep tendon reflexes are symmetric, but babinski positive bilaterally. Sensation, coordination and gait not tested.    ASSESSMENT/PLAN Ms. Amber Rogers is a 77 y.o. female with history of COPD, CHF, HTN, HLD, CKD III, CAD s/p stent presenting with sudden onset nausea, vomiting and dizziness.   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  Mild hydrocephalus lateral ventricles.    CTA head no vascular malformation or abnormal enhancement.  Stable cerebellar and IVH.  No LVO.  Intracranial atherosclerosis.  Repeat CT - Stable volume of intracranial hemorrhage  Repeat CT in am  CUS - pending  2D Echo  pending  UDS pending  LDL pending  HgbA1c pending  Heparin subq for VTE prophylaxis  NPO on TF @ 50cc/h  aspirin 81 mg daily and Brilinta 90 mg twice daily prior to admission, now on no antithrombotics  Therapy recommendations:  Pending  Disposition:  pending   Continue ICU level care  Carotid stenosis   CUS 08/2017 R ICA stenosis 50-69%, L ICA < 50%  CUS repeat pending  Obstructive hydrocephalus s/p R frontal EVD   EVD placed 8/22  Patent now on 15cmH2O  Minimal OP  NSG on board  Acute Hypoxic Respiratory failure  Secondary to stroke  Intubated in the ED with neuro decline   On propofol and Fentanyl   CCM on board  Wean off vent as able  Hypertensive Emergency  BP as high as 209/158 upon arrival  Now stable off cardene  Home Meds: Norvasc 10, resumed  SBP goal < 160  Hyperlipidemia  Home meds:  Lipitor 80  LDL pending  Plan to continue statin at time of d/c  Other Stroke Risk Factors  Advanced age  Former Cigarette smoker, quit 2 years ago  Coronary artery disease s/p stent - on ASA and brilinta PTA  Chronic Congestive heart failure  Other Active Problems  Hx vertigo   COPD - resumed home prednisone per tube  Leukocytosis - 14.9 ->14.0->14.3  CKD stage III - Creatinine - 1.35  Hospital day # 2  This patient is  critically ill due to cerebellar ICH with IVH and hydrocephalus, respiratory failure, hypertensive emergency and at significant risk of neurological worsening, death form recurrent bleeding, hydrocephalus, increased ICP, cerebral edema and brain death, seizure. This patient's care requires constant monitoring of vital signs, hemodynamics, respiratory and cardiac monitoring, review of multiple databases, neurological assessment, discussion with family, other specialists and medical decision making of high complexity. I spent 45 minutes of neurocritical care time in the care of this patient. I had long discussion with daughters at bedside, updated pt current condition, treatment plan and potential prognosis. They expressed understanding and appreciation.  Rosalin Hawking, MD PhD Stroke Neurology 03/29/2018 11:44 AM    To contact Stroke Continuity provider, please refer to [email or web address] After hours, contact General Neurology

## 2018-03-29 NOTE — Progress Notes (Signed)
PULMONARY / CRITICAL CARE MEDICINE   Name: Amber Rogers MRN: [id number] DOB: [birth year]    ADMISSION DATE:  03/27/2018 CONSULTATION DATE:  03/27/2018  REFERRING MD:  Dr. Leonel Ramsay  CHIEF COMPLAINT:  ICH  HISTORY OF PRESENT ILLNESS:  Patient is encephalopathic and/or intubated. Therefore history has been obtained from chart review. 77 year old female with PMH as below, which is significant for HTN, CKD, CHF (EF 55% and grade 2 DD in 2016), and COPD. She presented to [facility] ED 8/22 with complaints of headache, nausea, and vomiting with onset of symptoms around 1200. She was found to be hypertensive. CT scan demonstrated hematoma in the left cerebellum with early hydrocephalus. Initially no neurosurgical intervention was indicated due to her reasonable mental status and dual antiplatelet regimen, however, her condition continued to decline and she required intubation. She was then deemed a candidate for EVD placement. PCCM consulted for medical/vent/BP management.  SUBJECTIVE:  No events overnight, no new complaints  VITAL SIGNS: BP 136/61   Pulse 65   Temp 97.6 F (36.4 C) (Oral)   Resp 15   Ht 5\' 6"  (1.676 m)   Wt 67 kg   SpO2 100%   BMI 23.84 kg/m   HEMODYNAMICS:    VENTILATOR SETTINGS: Vent Mode: PRVC FiO2 (%):  [30 %] 30 % Set Rate:  [15 bmp] 15 bmp Vt Set:  [470 mL] 470 mL PEEP:  [5 cmH20] 5 cmH20 Pressure Support:  [8 cmH20] 8 cmH20 Plateau Pressure:  [13 cmH20-16 cmH20] 16 cmH20  INTAKE / OUTPUT: I/O last 3 completed shifts: In: 2484.1 [I.V.:1944.9; NG/GT:389.2; IV Piggyback:150] Out: 1100 [Urine:895; Emesis/NG output:130; Drains:75]  PHYSICAL EXAMINATION: Gen:      Acutely ill appearing female, NAD HEENT:  [place]/EVD in place, PERRL, EOM-I and MMM Neck:      No masses noted, -JVD Lungs:    CTA bilaterally CV:         RRR, Nl S1/S2 and -M/R/G Abd:      Soft, NT, ND and +BS Ext:    -edema and -tenderness Skin:      Intact Neuro:   Sedated, unresponsive, EVD  drain but evidently wakes up and localizes but does not follow commands per RN  LABS:  BMET Recent Labs  Lab 03/27/18 1403 03/28/18 0645 03/29/18 0558  NA 138 139 138  K 4.2 3.8 3.6  CL 105 106 107  CO2 23 24 23   BUN 22 21 27*  CREATININE 1.36* 1.33* 1.35*  GLUCOSE 178* 114* 135*   Electrolytes Recent Labs  Lab 03/27/18 1403  03/28/18 0645 03/28/18 1444 03/28/18 1702 03/29/18 0558  CALCIUM 8.8*  --  8.7*  --   --  8.4*  MG  --    < > 1.9 2.0 1.9 2.0  PHOS  --    < > 3.4 3.6 3.5 3.0   < > = values in this interval not displayed.   CBC Recent Labs  Lab 03/27/18 1403 03/28/18 0645 03/29/18 0558  WBC 14.9* 14.0* 14.3*  HGB 11.6* 10.7* 9.8*  HCT 37.5 34.5* 31.7*  PLT 308 333 282    Coag's Recent Labs  Lab 03/27/18 2253  INR 1.06    Sepsis Markers No results for input(s): LATICACIDVEN, PROCALCITON, O2SATVEN in the last 168 hours.  ABG Recent Labs  Lab 03/27/18 2344 03/28/18 0442  PHART 7.396 7.379  PCO2ART 39.9 39.7  PO2ART 227.0* 109.0*    Liver Enzymes Recent Labs  Lab 03/27/18 1403  AST 25  ALT 16  ALKPHOS 82  BILITOT 1.2  ALBUMIN 3.4*    Cardiac Enzymes Recent Labs  Lab 03/27/18 1403  TROPONINI <0.03    Glucose Recent Labs  Lab 03/28/18 1631 03/28/18 2110 03/28/18 2351 03/29/18 0335 03/29/18 0853 03/29/18 1250  GLUCAP 134* 135* 139* 145* 114* 127*    Imaging Ct Head Wo Contrast  Result Date: 03/28/2018 CLINICAL DATA:  77 y/o F; left cerebellar and interventricular hemorrhage with mild hydrocephalus for follow-up. EXAM: CT HEAD WITHOUT CONTRAST TECHNIQUE: Contiguous axial images were obtained from the base of the skull through the vertex without intravenous contrast. COMPARISON:  03/27/2018 CT head and CTA head. FINDINGS: Brain: Stable volume of intracranial hemorrhage within the left cerebellar hemisphere and the ventricular system. Some interval redistribution of hemorrhage pooling in the occipital horns of lateral  ventricles and within the subarachnoid space of the sylvian fissures. No new acute hemorrhage, mass effect, or herniation. Interval placement of a right frontal approach ventriculostomy catheter into the frontal horn of right lateral ventricle with tip near the foramen of [place]. Mild decrease in size of the lateral and third ventricles. Vascular: No hyperdense vessel or unexpected calcification. Skull: Postsurgical changes related to a right frontal craniotomy mild air and edema in the overlying scalp. Sinuses/Orbits: Intubated patient. No acute abnormality of the paranasal sinuses or orbits. Other: None. IMPRESSION: 1. Stable volume of intracranial hemorrhage within the left cerebellar hemisphere and the ventricular system. Some redistribution of blood products to the lateral ventricle atria and sylvian fissures. 2. No new acute intracranial abnormality. 3. Interval right frontal approach ventriculostomy catheter placement. Mild decrease in hydrocephalus. Electronically Signed   By: Kristine Garbe M.D.   On: 03/28/2018 20:56     STUDIES:  CT head 8/22 > 2 cm hematoma in the deep left cerebellum with fourth ventricular extension extending into the third/lateral ventricles and into the dorsal cervical canal. There is mild hydrocephalus at the lateral ventricles when compared to 2016. A deep cerebellar origin favors hypertensive etiology. CT angio head 8/22 > No vascular malformation or abnormal enhancement of the brain identified. Stable cerebellar and intraventricular hemorrhage. Patent anterior and posterior intracranial circulation. No large vessel occlusion, aneurysm, or significant stenosis. Calcific atherosclerosis of the carotid and vertebral arteries with mild stenosis.  CULTURES: None   ANTIBIOTICS: None   SIGNIFICANT EVENTS: 8/22 > admit, EVD placed.   LINES/TUBES: ETT 8/22 > EVD 8/22 >  ASSESSMENT / PLAN: Intracranial hemorrhage 2cm L cerebellar hematoma with  intraventricular extension. Secondary to hypertensive crisis. EVD placed by neurosurgery 8/22.  Management per neurology and neurosurgery Goal SBP of <160 Holding home ASA and brilanta  Acute Hypoxic Respiratory failure: Intubated for airway protection in the setting of intracranial hemorrhage Full vent support for 2 more days until the "acute phase" is over per neurology No plans on extubation today CT of the head today Full code status  COPD without acute exacerbation Continue scheduled nebs  Osteoarthritis On chronic prednisone of 5 mg.  Continue for now.  Chronic CHF Tele monitoring Norvasc  The patient is critically ill with multiple organ systems failure and requires high complexity decision making for assessment and support, frequent evaluation and titration of therapies, application of advanced monitoring technologies and extensive interpretation of multiple databases.   Critical Care Time devoted to patient care services described in this note is  35  Minutes. This time reflects time of care of this signee Dr Jennet Maduro. This critical care time does not reflect procedure time, or  teaching time or supervisory time of PA/NP/Med student/Med Resident etc but could involve care discussion time.  Rush Farmer, M.D. [facility] Pulmonary/Critical Care Medicine. Pager: [phone]. After hours pager: [phone].

## 2018-03-29 NOTE — Progress Notes (Signed)
200 m; of IV fentanyl wasted in sink witness by Verdis Prime.

## 2018-03-29 NOTE — Progress Notes (Signed)
SLP Cancellation Note  Patient Details Name: MAKAILAH SLAVICK MRN: [id number] DOB: 1940-08-31   Cancelled treatment:        Cancelled due to medical status. Pt is still intubated.  Charlynne Cousins Zay Yeargan, MA, CCC-SLP 03/29/2018 12:14 PM

## 2018-03-29 NOTE — Progress Notes (Signed)
Patient ID: Amber Rogers, female   DOB: 1941/08/04, 77 y.o.   MRN: [id number] evd patent at 10 cm

## 2018-03-29 NOTE — Progress Notes (Signed)
VASCULAR LAB PRELIMINARY  PRELIMINARY  PRELIMINARY  PRELIMINARY  Carotid duplex completed.    Preliminary report:  1-39% ICA stenosis.  Vertebral artery flow is antegrade. Significant calcific plaque bilateral bifurcations, however, velocities are not significantly increased.   Shields Pautz, RVT 03/29/2018, 11:59 AM

## 2018-03-30 ENCOUNTER — Inpatient Hospital Stay (HOSPITAL_COMMUNITY): Payer: Medicare Other

## 2018-03-30 DIAGNOSIS — G911 Obstructive hydrocephalus: Secondary | ICD-10-CM

## 2018-03-30 DIAGNOSIS — N183 Chronic kidney disease, stage 3 (moderate): Secondary | ICD-10-CM

## 2018-03-30 DIAGNOSIS — I361 Nonrheumatic tricuspid (valve) insufficiency: Secondary | ICD-10-CM

## 2018-03-30 DIAGNOSIS — I350 Nonrheumatic aortic (valve) stenosis: Secondary | ICD-10-CM

## 2018-03-30 DIAGNOSIS — D72829 Elevated white blood cell count, unspecified: Secondary | ICD-10-CM

## 2018-03-30 LAB — URINALYSIS, COMPLETE (UACMP) WITH MICROSCOPIC
BILIRUBIN URINE: NEGATIVE
Glucose, UA: NEGATIVE mg/dL
Hgb urine dipstick: NEGATIVE
KETONES UR: NEGATIVE mg/dL
Leukocytes, UA: NEGATIVE
Nitrite: NEGATIVE
PH: 5 (ref 5.0–8.0)
Protein, ur: 30 mg/dL — AB
Specific Gravity, Urine: 1.017 (ref 1.005–1.030)

## 2018-03-30 LAB — CBC
HCT: 34.4 % — ABNORMAL LOW (ref 36.0–46.0)
HEMOGLOBIN: 10 g/dL — AB (ref 12.0–15.0)
MCH: 26.7 pg (ref 26.0–34.0)
MCHC: 29.1 g/dL — ABNORMAL LOW (ref 30.0–36.0)
MCV: 92 fL (ref 78.0–100.0)
Platelets: 312 10*3/uL (ref 150–400)
RBC: 3.74 MIL/uL — AB (ref 3.87–5.11)
RDW: 15.9 % — ABNORMAL HIGH (ref 11.5–15.5)
WBC: 16.2 10*3/uL — ABNORMAL HIGH (ref 4.0–10.5)

## 2018-03-30 LAB — LIPID PANEL
CHOL/HDL RATIO: 1.8 ratio
Cholesterol: 177 mg/dL (ref 0–200)
HDL: 101 mg/dL (ref >40)
LDL Cholesterol: 66 mg/dL (ref 0–99)
Triglycerides: 49 mg/dL (ref <150)
VLDL: 10 mg/dL (ref 0–40)

## 2018-03-30 LAB — BASIC METABOLIC PANEL
Anion gap: 9 (ref 5–15)
BUN: 33 mg/dL — AB (ref 8–23)
CHLORIDE: 110 mmol/L (ref 98–111)
CO2: 21 mmol/L — ABNORMAL LOW (ref 22–32)
Calcium: 8.3 mg/dL — ABNORMAL LOW (ref 8.9–10.3)
Creatinine, Ser: 1.43 mg/dL — ABNORMAL HIGH (ref 0.44–1.00)
GFR calc Af Amer: 41 mL/min — ABNORMAL LOW (ref >60)
GFR calc non Af Amer: 35 mL/min — ABNORMAL LOW (ref >60)
GLUCOSE: 112 mg/dL — AB (ref 70–99)
POTASSIUM: 5.3 mmol/L — AB (ref 3.5–5.1)
Sodium: 140 mmol/L (ref 135–145)

## 2018-03-30 LAB — GLUCOSE, CAPILLARY
GLUCOSE-CAPILLARY: 146 mg/dL — AB (ref 70–99)
GLUCOSE-CAPILLARY: 164 mg/dL — AB (ref 70–99)
Glucose-Capillary: 104 mg/dL — ABNORMAL HIGH (ref 70–99)
Glucose-Capillary: 104 mg/dL — ABNORMAL HIGH (ref 70–99)
Glucose-Capillary: 116 mg/dL — ABNORMAL HIGH (ref 70–99)
Glucose-Capillary: 119 mg/dL — ABNORMAL HIGH (ref 70–99)
Glucose-Capillary: 136 mg/dL — ABNORMAL HIGH (ref 70–99)

## 2018-03-30 LAB — POCT I-STAT 3, ART BLOOD GAS (G3+)
Acid-base deficit: 4 mmol/L — ABNORMAL HIGH (ref 0.0–2.0)
BICARBONATE: 22.7 mmol/L (ref 20.0–28.0)
O2 Saturation: 95 %
Patient temperature: 98.6
TCO2: 24 mmol/L (ref 22–32)
pCO2 arterial: 49.4 mmHg — ABNORMAL HIGH (ref 32.0–48.0)
pH, Arterial: 7.27 — ABNORMAL LOW (ref 7.350–7.450)
pO2, Arterial: 85 mmHg (ref 83.0–108.0)

## 2018-03-30 LAB — ECHOCARDIOGRAM COMPLETE
HEIGHTINCHES: 66 in
WEIGHTICAEL: 2289.26 [oz_av]

## 2018-03-30 LAB — TSH: TSH: 2.531 u[IU]/mL (ref 0.350–4.500)

## 2018-03-30 LAB — POTASSIUM: POTASSIUM: 5.3 mmol/L — AB (ref 3.5–5.1)

## 2018-03-30 LAB — PHOSPHORUS: Phosphorus: 3.6 mg/dL (ref 2.5–4.6)

## 2018-03-30 LAB — HEMOGLOBIN A1C
HEMOGLOBIN A1C: 5.5 % (ref 4.8–5.6)
Mean Plasma Glucose: 111.15 mg/dL

## 2018-03-30 LAB — MAGNESIUM: Magnesium: 2.3 mg/dL (ref 1.7–2.4)

## 2018-03-30 MED ORDER — HYDRALAZINE HCL 20 MG/ML IJ SOLN
10.0000 mg | INTRAMUSCULAR | Status: DC | PRN
  Administered 2018-03-30 – 2018-04-05 (×9): 10 mg via INTRAVENOUS
  Filled 2018-03-30 (×9): qty 1

## 2018-03-30 MED ORDER — SODIUM POLYSTYRENE SULFONATE 15 GM/60ML PO SUSP
30.0000 g | Freq: Two times a day (BID) | ORAL | Status: AC
  Administered 2018-03-30 (×2): 30 g
  Filled 2018-03-30 (×2): qty 120

## 2018-03-30 MED ORDER — FUROSEMIDE 10 MG/ML IJ SOLN
40.0000 mg | Freq: Once | INTRAMUSCULAR | Status: AC
  Administered 2018-03-30: 40 mg via INTRAVENOUS
  Filled 2018-03-30: qty 4

## 2018-03-30 MED ORDER — PANTOPRAZOLE SODIUM 40 MG PO TBEC
40.0000 mg | DELAYED_RELEASE_TABLET | Freq: Every day | ORAL | Status: DC
  Administered 2018-03-30 – 2018-03-31 (×2): 40 mg via ORAL
  Filled 2018-03-30 (×2): qty 1

## 2018-03-30 MED ORDER — FREE WATER
250.0000 mL | Status: DC
  Administered 2018-03-30 – 2018-04-03 (×25): 250 mL

## 2018-03-30 MED ORDER — HYDRALAZINE HCL 20 MG/ML IJ SOLN: 5.0000 mg | INTRAMUSCULAR | Status: DC | PRN

## 2018-03-30 MED ORDER — LABETALOL HCL 100 MG PO TABS
100.0000 mg | ORAL_TABLET | Freq: Three times a day (TID) | ORAL | Status: DC
  Administered 2018-03-30 – 2018-03-31 (×6): 100 mg via ORAL
  Filled 2018-03-30 (×6): qty 1

## 2018-03-30 NOTE — Progress Notes (Signed)
PULMONARY / CRITICAL CARE MEDICINE   Name: Amber Rogers MRN: [id number] DOB: 1941/07/31    ADMISSION DATE:  03/27/2018 CONSULTATION DATE:  03/27/2018  REFERRING MD:  Dr. Leonel Ramsay  CHIEF COMPLAINT:  ICH  HISTORY OF PRESENT ILLNESS:  Patient is encephalopathic and/or intubated. Therefore history has been obtained from chart review. 77 year old female with PMH as below, which is significant for HTN, CKD, CHF (EF 55% and grade 2 DD in 2016), and COPD. She presented to [facility] ED 8/22 with complaints of headache, nausea, and vomiting with onset of symptoms around 1200. She was found to be hypertensive. CT scan demonstrated hematoma in the left cerebellum with early hydrocephalus. Initially no neurosurgical intervention was indicated due to her reasonable mental status and dual antiplatelet regimen, however, her condition continued to decline and she required intubation. She was then deemed a candidate for EVD placement. PCCM consulted for medical/vent/BP management.  SUBJECTIVE:  No events overnight, no new copmlaints  VITAL SIGNS: BP (!) 112/53 (BP Location: Left Arm)   Pulse 80   Temp 99.2 F (37.3 C) (Axillary)   Resp 14   Ht 5\' 6"  (1.676 m)   Wt 64.9 kg   SpO2 93%   BMI 23.09 kg/m   HEMODYNAMICS:    VENTILATOR SETTINGS: Vent Mode: PRVC FiO2 (%):  [30 %-40 %] 40 % Set Rate:  [15 bmp] 15 bmp Vt Set:  [470 mL] 470 mL PEEP:  [5 cmH20] 5 cmH20 Plateau Pressure:  [9 cmH20-16 cmH20] 9 cmH20  INTAKE / OUTPUT: I/O last 3 completed shifts: In: 2397.2 [I.V.:708.1; NG/GT:1589.2; IV Piggyback:100] Out: 9326 [Urine:1450; Emesis/NG output:130; Drains:142]  PHYSICAL EXAMINATION: Gen:      Acutely ill appearing female, NAD HEENT:  [place]/EVD in place, PERRL, EOM-I and MMM Neck:      No masses, -JVD Lungs:    CTA bilaterally CV:         RRR, Nl S1/S2 and -M/R/G Abd:      Soft, NT, ND and +BS Ext:    -edema and -tenderness Skin:      Intact Neuro:   Sedated, unresponsive, EVD drain  but evidently wakes up and localizes but does not follow commands per RN  LABS:  BMET Recent Labs  Lab 03/28/18 0645 03/29/18 0558 03/30/18 0235  NA 139 138 140  K 3.8 3.6 5.3*  CL 106 107 110  CO2 24 23 21*  BUN 21 27* 33*  CREATININE 1.33* 1.35* 1.43*  GLUCOSE 114* 135* 112*   Electrolytes Recent Labs  Lab 03/28/18 0645  03/29/18 0558 03/29/18 1708 03/30/18 0235  CALCIUM 8.7*  --  8.4*  --  8.3*  MG 1.9   < > 2.0 2.2 2.3  PHOS 3.4   < > 3.0 3.6 3.6   < > = values in this interval not displayed.   CBC Recent Labs  Lab 03/28/18 0645 03/29/18 0558 03/30/18 0235  WBC 14.0* 14.3* 16.2*  HGB 10.7* 9.8* 10.0*  HCT 34.5* 31.7* 34.4*  PLT 333 282 312   Coag's Recent Labs  Lab 03/27/18 2253  INR 1.06   Sepsis Markers No results for input(s): LATICACIDVEN, PROCALCITON, O2SATVEN in the last 168 hours.  ABG Recent Labs  Lab 03/27/18 2344 03/28/18 0442 03/30/18 0331  PHART 7.396 7.379 7.270*  PCO2ART 39.9 39.7 49.4*  PO2ART 227.0* 109.0* 85.0    Liver Enzymes Recent Labs  Lab 03/27/18 1403  AST 25  ALT 16  ALKPHOS 82  BILITOT 1.2  ALBUMIN  3.4*    Cardiac Enzymes Recent Labs  Lab 03/27/18 1403  TROPONINI <0.03    Glucose Recent Labs  Lab 03/29/18 1706 03/29/18 1958 03/30/18 0028 03/30/18 0410 03/30/18 0733 03/30/18 1106  GLUCAP 135* 118* 104* 104* 116* 119*    Imaging Ct Head Wo Contrast  Result Date: 03/30/2018 CLINICAL DATA:  77 y/o F; follow-up for intracranial hemorrhage, intraventricular hemorrhage, and hydrocephalus. EXAM: CT HEAD WITHOUT CONTRAST TECHNIQUE: Contiguous axial images were obtained from the base of the skull through the vertex without intravenous contrast. COMPARISON:  03/28/2018 and 03/27/2018 CT head. FINDINGS: Brain: Stable volume acute hemorrhage within the left medial cerebellar hemisphere, ventricular system, and subarachnoid spaces of the sylvian fissures. There is some interval redistribution, for example  decreased hemorrhage within the frontal horns of lateral ventricles, and increased pooling in the occipital horns. No new acute intracranial hemorrhage, stroke, or mass effect. Right frontal approach ventriculostomy catheter is stable in position traversing the frontal horn of right lateral ventricle. Stable ventricle size. Vascular: Calcific atherosclerosis of carotid siphons and vertebral arteries. Skull: Postsurgical changes related to a right frontal approach ventriculostomy catheter with decreased edema in the scalp. Sinuses/Orbits: Intubated patient. Maxillary sinus mucous retention cysts. Normal aeration of mastoid air cells. Orbits are unremarkable. Other: None. IMPRESSION: Stable volume of intraventricular, subarachnoid, and left cerebellar hemorrhage. Stable ventricle size. No new acute intracranial abnormality. Electronically Signed   By: Kristine Garbe M.D.   On: 03/30/2018 05:12   Dg Chest Port 1 View  Result Date: 03/30/2018 CLINICAL DATA:  Endotracheal tube placement. EXAM: PORTABLE CHEST 1 VIEW COMPARISON:  Radiograph of March 28, 2018. FINDINGS: Stable cardiomediastinal silhouette. Endotracheal and nasogastric tubes are unchanged in position. No pneumothorax or pleural effusion is noted. Right lung is clear. Mild left basilar subsegmental atelectasis is noted. Bony thorax is unremarkable. IMPRESSION: Mild left basilar subsegmental atelectasis. Stable support apparatus. Electronically Signed   By: Marijo Conception, M.D.   On: 03/30/2018 07:26     STUDIES:  CT head 8/22 > 2 cm hematoma in the deep left cerebellum with fourth ventricular extension extending into the third/lateral ventricles and into the dorsal cervical canal. There is mild hydrocephalus at the lateral ventricles when compared to 2016. A deep cerebellar origin favors hypertensive etiology. CT angio head 8/22 > No vascular malformation or abnormal enhancement of the brain identified. Stable cerebellar and  intraventricular hemorrhage. Patent anterior and posterior intracranial circulation. No large vessel occlusion, aneurysm, or significant stenosis. Calcific atherosclerosis of the carotid and vertebral arteries with mild stenosis.  CULTURES: None   ANTIBIOTICS: None   SIGNIFICANT EVENTS: 8/22 > admit, EVD placed.   LINES/TUBES: ETT 8/22 > EVD 8/22 >  ASSESSMENT / PLAN: Intracranial hemorrhage 2cm L cerebellar hematoma with intraventricular extension. Secondary to hypertensive crisis. EVD placed by neurosurgery 8/22.  Management per neurology and neurosurgery SBP<160 goal Hold anti-platelet agents for now  Acute Hypoxic Respiratory failure: Intubated for airway protection in the setting of intracranial hemorrhage Maintain on PS as tolerated No extubation until mental status improves Spoke with the daughter, she does not feel her mother would want trach/peg but wants more time to think about it Titrate O2 for sat of 88-92% CT of the head today Full code status  COPD without acute exacerbation Scheduled nebs as ordered  Osteoarthritis Continue chronic prednisone of 5 mg PO per day  Chronic CHF Tele monitoring Norvasc PRN norvasc  Non AG Metabolic acidosis due to hyperchloremia Free water 250 ml q4 Single dose of  lasix 40 mg IV x1  Hyperkalemia: Kayexalate x1 BMET in AM  The patient is critically ill with multiple organ systems failure and requires high complexity decision making for assessment and support, frequent evaluation and titration of therapies, application of advanced monitoring technologies and extensive interpretation of multiple databases.   Critical Care Time devoted to patient care services described in this note is  33  Minutes. This time reflects time of care of this signee Dr Jennet Maduro. This critical care time does not reflect procedure time, or teaching time or supervisory time of PA/NP/Med student/Med Resident etc but could involve care discussion  time.  Rush Farmer, M.D. [facility] Pulmonary/Critical Care Medicine. Pager: [phone]. After hours pager: [phone].

## 2018-03-30 NOTE — Progress Notes (Addendum)
OT Cancellation Note  Patient Details Name: Amber Rogers MRN: [id number] DOB: May 19, 1941   Cancelled Treatment:    Reason Eval/Treat Not Completed: Patient not medically ready. Pt remains on bedrest, intubated, and sedated. Will continue to follow for medical readiness prior to initiation of OT evaluation.   Norman Herrlich, MS OTR/L  Pager: [phone]   Norman Herrlich 03/30/2018, 6:49 AM

## 2018-03-30 NOTE — Progress Notes (Signed)
Patient ID: Amber Rogers, female   DOB: [birth year], 77 y.o.   MRN: [id number] Ventriculostomy remains patent with good output of pink CSF

## 2018-03-30 NOTE — Progress Notes (Signed)
SLP Cancellation Note  Patient Details Name: LACRESHIA BONDARENKO MRN: [id number] DOB: [birth year]   Cancelled treatment:       Reason Eval/Treat Not Completed: Medical issues which prohibited therapy. Pt remains intubated. Will f/u as able.   Germain Osgood 03/30/2018, 7:59 AM  Germain Osgood, M.A. CCC-SLP [phone]

## 2018-03-30 NOTE — Progress Notes (Signed)
  Echocardiogram 2D Echocardiogram has been performed.  Merrie Roof F 03/30/2018, 2:07 PM

## 2018-03-30 NOTE — Progress Notes (Signed)
STROKE TEAM PROGRESS NOTE   INTERVAL HISTORY Her daughters are at the bedside.  Patient remained intubated with sedation. Did not open eyes on pain stimulation. Withdraw to all extremities on pain. Myoclonus on arm extension b/l. Repeat CT head stable, no new abnormalities.    Vitals:   03/30/18 0530 03/30/18 0600 03/30/18 0630 03/30/18 0700  BP: (!) 134/55 (!) 135/53 (!) 137/59 (!) 147/61  Pulse: 67 67 67 69  Resp: 13 13 13 13   Temp:      TempSrc:      SpO2: 98% 98% 98% 97%  Weight:      Height:        CBC:  Recent Labs  Lab 03/27/18 1403  03/29/18 0558 03/30/18 0235  WBC 14.9*   < > 14.3* 16.2*  NEUTROABS 12.2*  --   --   --   HGB 11.6*   < > 9.8* 10.0*  HCT 37.5   < > 31.7* 34.4*  MCV 86.4   < > 87.1 92.0  PLT 308   < > 282 312   < > = values in this interval not displayed.    Basic Metabolic Panel:  Recent Labs  Lab 03/29/18 0558 03/29/18 1708 03/30/18 0235  NA 138  --  140  K 3.6  --  5.3*  CL 107  --  110  CO2 23  --  21*  GLUCOSE 135*  --  112*  BUN 27*  --  33*  CREATININE 1.35*  --  1.43*  CALCIUM 8.4*  --  8.3*  MG 2.0 2.2 2.3  PHOS 3.0 3.6 3.6   Lipid Panel:     Component Value Date/Time   CHOL 177 03/30/2018 0235   TRIG 49 03/30/2018 0235   HDL 101 03/30/2018 0235   CHOLHDL 1.8 03/30/2018 0235   VLDL 10 03/30/2018 0235   LDLCALC 66 03/30/2018 0235   HgbA1c:  Lab Results  Component Value Date   HGBA1C 5.5 03/30/2018   Urine Drug Screen:     Component Value Date/Time   LABOPIA NONE DETECTED 03/29/2018 1110   COCAINSCRNUR NONE DETECTED 03/29/2018 1110   LABBENZ NONE DETECTED 03/29/2018 1110   AMPHETMU NONE DETECTED 03/29/2018 1110   THCU NONE DETECTED 03/29/2018 1110   LABBARB NONE DETECTED 03/29/2018 1110    Alcohol Level     Component Value Date/Time   ETH <10 03/27/2018 1354    IMAGING  Ct Angio Head W Or Wo Contrast 03/27/2018 IMPRESSION:  1. No vascular malformation or abnormal enhancement of the brain identified.  2.  Stable cerebellar and intraventricular hemorrhage.  3. Patent anterior and posterior intracranial circulation. No large vessel occlusion, aneurysm, or significant stenosis.  4. Calcific atherosclerosis of the carotid and vertebral arteries with mild stenosis.    Ct Head Wo Contrast 03/28/2018 IMPRESSION:  1. Stable volume of intracranial hemorrhage within the left cerebellar hemisphere and the ventricular system. Some redistribution of blood products to the lateral ventricle atria and sylvian fissures.  2. No new acute intracranial abnormality.  3. Interval right frontal approach ventriculostomy catheter placement. Mild decrease in hydrocephalus.    Ct Head Wo Contrast 03/27/2018 IMPRESSION:  2 cm hematoma in the deep left cerebellum with fourth ventricular extension extending into the third/lateral ventricles and into the dorsal cervical canal. There is mild hydrocephalus at the lateral ventricles when compared to 2016. A deep cerebellar origin favors hypertensive etiology.   CUS 1-39% ICA stenosis.  Vertebral artery flow is antegrade. Significant calcific plaque  bilateral bifurcations, however, velocities are not significantly increased.   TTE pending  Ct Head Wo Contrast  Result Date: 03/30/2018 CLINICAL DATA:  77 y/o F; follow-up for intracranial hemorrhage, intraventricular hemorrhage, and hydrocephalus. EXAM: CT HEAD WITHOUT CONTRAST TECHNIQUE: Contiguous axial images were obtained from the base of the skull through the vertex without intravenous contrast. COMPARISON:  03/28/2018 and 03/27/2018 CT head. FINDINGS: Brain: Stable volume acute hemorrhage within the left medial cerebellar hemisphere, ventricular system, and subarachnoid spaces of the sylvian fissures. There is some interval redistribution, for example decreased hemorrhage within the frontal horns of lateral ventricles, and increased pooling in the occipital horns. No new acute intracranial hemorrhage, stroke, or mass effect.  Right frontal approach ventriculostomy catheter is stable in position traversing the frontal horn of right lateral ventricle. Stable ventricle size. Vascular: Calcific atherosclerosis of carotid siphons and vertebral arteries. Skull: Postsurgical changes related to a right frontal approach ventriculostomy catheter with decreased edema in the scalp. Sinuses/Orbits: Intubated patient. Maxillary sinus mucous retention cysts. Normal aeration of mastoid air cells. Orbits are unremarkable. Other: None. IMPRESSION: Stable volume of intraventricular, subarachnoid, and left cerebellar hemorrhage. Stable ventricle size. No new acute intracranial abnormality. Electronically Signed   By: Kristine Garbe M.D.   On: 03/30/2018 05:12   Dg Chest Port 1 View  Result Date: 03/30/2018 CLINICAL DATA:  Endotracheal tube placement. EXAM: PORTABLE CHEST 1 VIEW COMPARISON:  Radiograph of March 28, 2018. FINDINGS: Stable cardiomediastinal silhouette. Endotracheal and nasogastric tubes are unchanged in position. No pneumothorax or pleural effusion is noted. Right lung is clear. Mild left basilar subsegmental atelectasis is noted. Bony thorax is unremarkable. IMPRESSION: Mild left basilar subsegmental atelectasis. Stable support apparatus. Electronically Signed   By: Marijo Conception, M.D.   On: 03/30/2018 07:26     PHYSICAL EXAM  elderly lady who is intubated and sedated. She has a right frontal ventriculostomy catheter. . Afebrile. Head is nontraumatic. Neck is supple without bruit.    Cardiac exam no murmur or gallop. Lungs are clear to auscultation. Distal pulses are well felt. Neurological Exam :  Intubated, eyes closed, on sedation with propofol and fentanyl. She did not eyes to pain stimulation, does not follow commands. Pupils are small 2 mm pinpoint and sluggishly reactive. Corneal reflexes are brinsk. She is on CPAP and tolerating well. She has a cough and gag. Motor system exam she is able to mild withdraw all 4  extremities with painful stimuli but does not follow commands or against gravity. Deep tendon reflexes are 3+ UEs and 1+ LEs symmetric, but babinski positive bilaterally. Startle response to loud voice, and myoclonus with UE extension movement bilaterally. Increased muscle tone bilaterally UEs. Sensation, coordination and gait not tested.    ASSESSMENT/PLAN Amber Rogers is a 77 y.o. female with history of COPD, CHF, HTN, HLD, CKD III, CAD s/p stent presenting with sudden onset nausea, vomiting and dizziness.   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  Mild hydrocephalus lateral ventricles.    CTA head no vascular malformation or abnormal enhancement.  Stable cerebellar and IVH.  No LVO.  Intracranial atherosclerosis.  Repeat CT - Stable volume of intracranial hemorrhage  Repeat CT - stable, no acute abnormalities.  CUS - Significant  calcific plaque bilateral bifurcations, however, velocities are not significantly increased.  2D Echo  pending  UDS neg  LDL 66  HgbA1c 5.5  Heparin subq for VTE prophylaxis  NPO on TF @ 50cc/h  aspirin 81 mg daily and Brilinta 90 mg twice daily prior to admission, now on no antithrombotics  Therapy recommendations:  Pending  Disposition:  pending   Encephalopathy   Startle response to loud voice  myoclonus with UE extension movement bilaterally  AKI on CKD - Cre 1.36-1.33-1.35-1.43  Leukocytosis - WBC 14.0->16.2  Overnight one spiking fever 101.1  CXR - left basilar atelectasis  UA pending  Hyperkalemia   K 3.6->5.3  Add kayexalate 30g bid for 2 doses  Check K 5pm today  BMP monitoring daily   Carotid  stenosis   CUS 08/2017 R ICA stenosis 50-69%, L ICA < 50%  CUS repeat showed b/l ICA 1-39% stenosis but significant calcified plaque at carotid bifurcation bilaterally.   Obstructive hydrocephalus s/p R frontal EVD   EVD placed 8/22  Patent now on 15cmH2O  Minimal OP  NSG on board  Repeat CT head 03/30/18 stable without hydrocephalus  Acute Hypoxic Respiratory failure  Secondary to stroke  Intubated in the ED with neuro decline   Still on propofol and Fentanyl   CCM on board  Wean off vent as able  Hypertensive Emergency  BP as high as 209/158 upon arrival  Now stable off cardene  Home Meds: Norvasc 10, resumed  Add labetalol 100mg  tid  SBP goal < 160  Hyperlipidemia  Home meds:  ?? Lipitor 80   LDL 66  Consider to resume lipitor on discharge  Other Stroke Risk Factors  Advanced age  Former Cigarette smoker, quit 2 years ago  Coronary artery disease s/p stent - on ASA and brilinta PTA  Chronic Congestive heart failure  Other Active Problems  Hx vertigo   COPD - resumed home prednisone per tube  Leukocytosis - 14.9 ->14.0->14.3->16.2  CKD stage III - Creatinine - 1.35->1.43  Hospital day # 3  This patient is critically ill due to cerebellar ICH with IVH and hydrocephalus, respiratory failure, hypertensive emergency and at significant risk of neurological worsening, death form recurrent bleeding, hydrocephalus, increased ICP, cerebral edema and brain death, seizure. This patient's care requires constant monitoring of vital signs, hemodynamics, respiratory and cardiac monitoring, review of multiple databases, neurological assessment, discussion with family, other specialists and medical decision making of high complexity. I spent 40 minutes of neurocritical care time in the care of this patient. I had long discussion with daughters at bedside, updated pt current condition, treatment plan and potential prognosis. They expressed understanding and  appreciation.   Rosalin Hawking, MD PhD Stroke Neurology 03/30/2018 10:56 AM   To contact Stroke Continuity provider, please refer to [email or web address] After hours, contact General Neurology

## 2018-03-30 NOTE — Progress Notes (Signed)
PULMONARY / CRITICAL CARE MEDICINE   Name: Amber Rogers MRN: [id number] DOB: 05/23/1941    ADMISSION DATE:  03/27/2018 CONSULTATION DATE:  03/27/2018  REFERRING MD:  Dr. Leonel Ramsay  CHIEF COMPLAINT:  ICH  HISTORY OF PRESENT ILLNESS:  Patient is encephalopathic and/or intubated. Therefore history has been obtained from chart review. 77 year old female with PMH as below, which is significant for HTN, CKD, CHF (EF 55% and grade 2 DD in 2016), and COPD. She presented to [facility] ED 8/22 with complaints of headache, nausea, and vomiting with onset of symptoms around 1200. She was found to be hypertensive. CT scan demonstrated hematoma in the left cerebellum with early hydrocephalus. Initially no neurosurgical intervention was indicated due to her reasonable mental status and dual antiplatelet regimen, however, her condition continued to decline and she required intubation. She was then deemed a candidate for EVD placement. PCCM consulted for medical/vent/BP management.  SUBJECTIVE:  Patient with continued sedation. Withdrawing to pain per nurse but does not localize. Sister at bedside.   VITAL SIGNS: BP (!) 161/62   Pulse 94   Temp 99.2 F (37.3 C) (Axillary)   Resp 17   Ht 5\' 6"  (1.676 m)   Wt 64.9 kg   SpO2 96%   BMI 23.09 kg/m    VENTILATOR SETTINGS: Vent Mode: PRVC FiO2 (%):  [30 %-40 %] 40 % Set Rate:  [15 bmp] 15 bmp Vt Set:  [470 mL] 470 mL PEEP:  [5 cmH20] 5 cmH20 Plateau Pressure:  [9 cmH20-16 cmH20] 9 cmH20  INTAKE / OUTPUT: I/O last 3 completed shifts: In: 2397.2 [I.V.:708.1; NG/GT:1589.2; IV Piggyback:100] Out: 2355 [Urine:1450; Emesis/NG output:130; Drains:142]  PHYSICAL EXAMINATION: Constitution: supine in bed, appears stated age,  HEENT: EVD drain in place, ET/OG tube Cardio: regular rate & rhythm,  Respiratory: CTAB, no w/r/r Abdominal: hypoactive BS, soft, NTTP MSK: no edema, +pedal pulses Neuro: sedated, unresponsive, per RN withdraws to pain, does not  localize Skin: diffuse hematomas    LABS:  BMET Recent Labs  Lab 03/28/18 0645 03/29/18 0558 03/30/18 0235  NA 139 138 140  K 3.8 3.6 5.3*  CL 106 107 110  CO2 24 23 21*  BUN 21 27* 33*  CREATININE 1.33* 1.35* 1.43*  GLUCOSE 114* 135* 112*   Electrolytes Recent Labs  Lab 03/28/18 0645  03/29/18 0558 03/29/18 1708 03/30/18 0235  CALCIUM 8.7*  --  8.4*  --  8.3*  MG 1.9   < > 2.0 2.2 2.3  PHOS 3.4   < > 3.0 3.6 3.6   < > = values in this interval not displayed.   CBC Recent Labs  Lab 03/28/18 0645 03/29/18 0558 03/30/18 0235  WBC 14.0* 14.3* 16.2*  HGB 10.7* 9.8* 10.0*  HCT 34.5* 31.7* 34.4*  PLT 333 282 312    Coag's Recent Labs  Lab 03/27/18 2253  INR 1.06    Sepsis Markers No results for input(s): LATICACIDVEN, PROCALCITON, O2SATVEN in the last 168 hours.  ABG Recent Labs  Lab 03/27/18 2344 03/28/18 0442 03/30/18 0331  PHART 7.396 7.379 7.270*  PCO2ART 39.9 39.7 49.4*  PO2ART 227.0* 109.0* 85.0    Liver Enzymes Recent Labs  Lab 03/27/18 1403  AST 25  ALT 16  ALKPHOS 82  BILITOT 1.2  ALBUMIN 3.4*    Cardiac Enzymes Recent Labs  Lab 03/27/18 1403  TROPONINI <0.03    Glucose Recent Labs  Lab 03/29/18 1706 03/29/18 1958 03/30/18 0028 03/30/18 0410 03/30/18 0733 03/30/18 1106  GLUCAP  135* 118* 104* 104* 116* 119*    Imaging Ct Head Wo Contrast  Result Date: 03/30/2018 CLINICAL DATA:  77 y/o F; follow-up for intracranial hemorrhage, intraventricular hemorrhage, and hydrocephalus. EXAM: CT HEAD WITHOUT CONTRAST TECHNIQUE: Contiguous axial images were obtained from the base of the skull through the vertex without intravenous contrast. COMPARISON:  03/28/2018 and 03/27/2018 CT head. FINDINGS: Brain: Stable volume acute hemorrhage within the left medial cerebellar hemisphere, ventricular system, and subarachnoid spaces of the sylvian fissures. There is some interval redistribution, for example decreased hemorrhage within the  frontal horns of lateral ventricles, and increased pooling in the occipital horns. No new acute intracranial hemorrhage, stroke, or mass effect. Right frontal approach ventriculostomy catheter is stable in position traversing the frontal horn of right lateral ventricle. Stable ventricle size. Vascular: Calcific atherosclerosis of carotid siphons and vertebral arteries. Skull: Postsurgical changes related to a right frontal approach ventriculostomy catheter with decreased edema in the scalp. Sinuses/Orbits: Intubated patient. Maxillary sinus mucous retention cysts. Normal aeration of mastoid air cells. Orbits are unremarkable. Other: None. IMPRESSION: Stable volume of intraventricular, subarachnoid, and left cerebellar hemorrhage. Stable ventricle size. No new acute intracranial abnormality. Electronically Signed   By: Kristine Garbe M.D.   On: 03/30/2018 05:12   Dg Chest Port 1 View  Result Date: 03/30/2018 CLINICAL DATA:  Endotracheal tube placement. EXAM: PORTABLE CHEST 1 VIEW COMPARISON:  Radiograph of March 28, 2018. FINDINGS: Stable cardiomediastinal silhouette. Endotracheal and nasogastric tubes are unchanged in position. No pneumothorax or pleural effusion is noted. Right lung is clear. Mild left basilar subsegmental atelectasis is noted. Bony thorax is unremarkable. IMPRESSION: Mild left basilar subsegmental atelectasis. Stable support apparatus. Electronically Signed   By: Marijo Conception, M.D.   On: 03/30/2018 07:26     STUDIES:  CT head 8/22 > 2 cm hematoma in the deep left cerebellum with fourth ventricular extension extending into the third/lateral ventricles and into the dorsal cervical canal. There is mild hydrocephalus at the lateral ventricles when compared to 2016. A deep cerebellar origin favors hypertensive etiology. CT angio head 8/22 > No vascular malformation or abnormal enhancement of the brain identified. Stable cerebellar and intraventricular hemorrhage. Patent anterior  and posterior intracranial circulation. No large vessel occlusion, aneurysm, or significant stenosis. Calcific atherosclerosis of the carotid and vertebral arteries with mild stenosis. CT Head 8/25: Stable volume of intraventricular, subarachnoid, and left cerebellar hemorrhage. Stable ventricle size. No new acute intracranial abnormality.   CULTURES: None   ANTIBIOTICS: None   SIGNIFICANT EVENTS: 8/22 > admit, EVD placed.   LINES/TUBES: ETT 8/22 > EVD 8/22 >  . sodium chloride Stopped (03/28/18 2114)  . feeding supplement (VITAL HIGH PROTEIN) 50 mL/hr at 03/30/18 1200  . fentaNYL infusion INTRAVENOUS 50 mcg/hr (03/29/18 1100)  . niCARDipine Stopped (03/29/18 0048)  . propofol (DIPRIVAN) infusion 15 mcg/kg/min (03/30/18 0038)    ASSESSMENT / PLAN: Intracranial hemorrhage 2cm L cerebellar hematoma with intraventricular extension. Secondary to hypertensive crisis. EVD placed by neurosurgery 8/22.  Management per neurology and neurosurgery Goal SBP of <160 On labetalol 100 TID and norvasc 10 PRN hydralazine 10mg  bp >160  Holding home ASA and brilanta  Acute Hypoxic Respiratory failure: Intubated for airway protection in the setting of intracranial hemorrhage On PS - Full vent support for 1 more days until the "acute phase" is over per neurology No plans on extubation today Full code status  COPD without acute exacerbation Continue scheduled nebs  Osteoarthritis On chronic prednisone of 5 mg.  Continue for now.  Chronic CHF Tele monitoring Norvasc 10 mgqd   Kristan Votta A, DO 03/30/2018, 11:34 AM Pager: [phone]

## 2018-03-31 ENCOUNTER — Inpatient Hospital Stay (HOSPITAL_COMMUNITY): Payer: Medicare Other

## 2018-03-31 DIAGNOSIS — I619 Nontraumatic intracerebral hemorrhage, unspecified: Secondary | ICD-10-CM

## 2018-03-31 DIAGNOSIS — Z978 Presence of other specified devices: Secondary | ICD-10-CM

## 2018-03-31 LAB — POCT I-STAT 3, ART BLOOD GAS (G3+)
Acid-base deficit: 1 mmol/L (ref 0.0–2.0)
Bicarbonate: 23.8 mmol/L (ref 20.0–28.0)
O2 SAT: 96 %
PCO2 ART: 38.9 mmHg (ref 32.0–48.0)
PH ART: 7.395 (ref 7.350–7.450)
Patient temperature: 98.6
TCO2: 25 mmol/L (ref 22–32)
pO2, Arterial: 85 mmHg (ref 83.0–108.0)

## 2018-03-31 LAB — CBC
HCT: 34.1 % — ABNORMAL LOW (ref 36.0–46.0)
HEMOGLOBIN: 10.5 g/dL — AB (ref 12.0–15.0)
MCH: 27.1 pg (ref 26.0–34.0)
MCHC: 30.8 g/dL (ref 30.0–36.0)
MCV: 88.1 fL (ref 78.0–100.0)
Platelets: 359 10*3/uL (ref 150–400)
RBC: 3.87 MIL/uL (ref 3.87–5.11)
RDW: 15.7 % — AB (ref 11.5–15.5)
WBC: 24.8 10*3/uL — AB (ref 4.0–10.5)

## 2018-03-31 LAB — GLUCOSE, CAPILLARY
GLUCOSE-CAPILLARY: 129 mg/dL — AB (ref 70–99)
GLUCOSE-CAPILLARY: 159 mg/dL — AB (ref 70–99)
GLUCOSE-CAPILLARY: 146 mg/dL — AB (ref 70–99)
Glucose-Capillary: 133 mg/dL — ABNORMAL HIGH (ref 70–99)
Glucose-Capillary: 144 mg/dL — ABNORMAL HIGH (ref 70–99)
Glucose-Capillary: 145 mg/dL — ABNORMAL HIGH (ref 70–99)

## 2018-03-31 LAB — BASIC METABOLIC PANEL
ANION GAP: 11 (ref 5–15)
BUN: 39 mg/dL — ABNORMAL HIGH (ref 8–23)
CALCIUM: 8.3 mg/dL — AB (ref 8.9–10.3)
CO2: 25 mmol/L (ref 22–32)
Chloride: 102 mmol/L (ref 98–111)
Creatinine, Ser: 1.57 mg/dL — ABNORMAL HIGH (ref 0.44–1.00)
GFR calc non Af Amer: 31 mL/min — ABNORMAL LOW (ref >60)
GFR, EST AFRICAN AMERICAN: 36 mL/min — AB (ref >60)
Glucose, Bld: 144 mg/dL — ABNORMAL HIGH (ref 70–99)
POTASSIUM: 3.7 mmol/L (ref 3.5–5.1)
Sodium: 138 mmol/L (ref 135–145)

## 2018-03-31 LAB — PHOSPHORUS: PHOSPHORUS: 4.5 mg/dL (ref 2.5–4.6)

## 2018-03-31 LAB — MAGNESIUM: Magnesium: 2 mg/dL (ref 1.7–2.4)

## 2018-03-31 LAB — TRIGLYCERIDES: Triglycerides: 67 mg/dL (ref <150)

## 2018-03-31 MED ORDER — SODIUM CHLORIDE 0.9 % IV SOLN
250.0000 mL | INTRAVENOUS | Status: DC
  Administered 2018-03-31 (×2): 1000 mL via INTRAVENOUS
  Administered 2018-04-01: 15:00:00 via INTRAVENOUS
  Administered 2018-04-02 – 2018-04-03 (×2): 250 mL via INTRAVENOUS
  Administered 2018-04-03 – 2018-04-04 (×2): 1000 mL via INTRAVENOUS

## 2018-03-31 MED ORDER — SODIUM CHLORIDE 0.9 % IV SOLN
1.0000 g | INTRAVENOUS | Status: DC
  Administered 2018-03-31 – 2018-04-04 (×5): 1 g via INTRAVENOUS
  Filled 2018-03-31 (×5): qty 1

## 2018-03-31 MED ORDER — VANCOMYCIN HCL IN DEXTROSE 750-5 MG/150ML-% IV SOLN
750.0000 mg | INTRAVENOUS | Status: DC
  Administered 2018-03-31: 750 mg via INTRAVENOUS
  Filled 2018-03-31 (×2): qty 150

## 2018-03-31 MED ORDER — FENTANYL CITRATE (PF) 100 MCG/2ML IJ SOLN
25.0000 ug | INTRAMUSCULAR | Status: DC | PRN
  Administered 2018-04-02 – 2018-04-04 (×3): 100 ug via INTRAVENOUS
  Administered 2018-04-05: 50 ug via INTRAVENOUS
  Administered 2018-04-05: 100 ug via INTRAVENOUS
  Administered 2018-04-05 – 2018-04-06 (×2): 50 ug via INTRAVENOUS
  Administered 2018-04-06: 100 ug via INTRAVENOUS
  Administered 2018-04-07: 50 ug via INTRAVENOUS
  Administered 2018-04-08 – 2018-04-11 (×5): 100 ug via INTRAVENOUS
  Filled 2018-03-31 (×14): qty 2

## 2018-03-31 NOTE — Progress Notes (Signed)
Occupational Therapy Discharge Patient Details Name: Amber Rogers MRN: [id number] DOB: [birth year] Today's Date: 03/31/2018 Time:  -     Patient discharged from OT services secondary to Not medically ready to start therapy at this time.  Please see latest therapy progress note for current level of functioning and progress toward goals.    Progress and discharge plan discussed with patient and/or caregiver: Patient unable to participate in discharge planning and no caregivers available  Please reorder therapy as appropriate.      Parke Poisson B 03/31/2018, 9:09 AM

## 2018-03-31 NOTE — Progress Notes (Signed)
Pharmacy Antibiotic Note  Amber Rogers is a 77 y.o. female admitted on 03/27/2018 with L cerebellum hematoma which required EVD placement.  Pharmacy has been consulted for Cefepime and Vancomyin dosing for empiric coverage of possible EVD infection in patient with fever up to 102. WBC up to 24.8 (on prednisone 5mg  as PTA).  SCr 1.57 ( trending up from 1.3 on admission)  Plan: Vancomycin 750mg  IV q24h Cefepime 1gm IV q24h Will f/u renal function, micro data, and pt's clinical condition  Height: 5\' 6"  (167.6 cm) Weight: 145 lb 11.6 oz (66.1 kg) IBW/kg (Calculated) : 59.3  Temp (24hrs), Avg:100 F (37.8 C), Min:99.1 F (37.3 C), Max:102.4 F (39.1 C)  Recent Labs  Lab 03/27/18 1403 03/28/18 0645 03/29/18 0558 03/30/18 0235 03/31/18 0333  WBC 14.9* 14.0* 14.3* 16.2* 24.8*  CREATININE 1.36* 1.33* 1.35* 1.43* 1.57*    Estimated Creatinine Clearance: 29.4 mL/min (A) (by C-G formula based on SCr of 1.57 mg/dL (H)).    No Known Allergies  Antimicrobials this admission: 8/26 Cefepime >>  8/26 Vancomycin >>   Dose adjustments this admission:   Microbiology results: 8/25 BCx:  8/26 Trach asp:   8/23 MRSA PCR: negative  Thank you for allowing pharmacy to be a part of this patient's care.  Sherlon Handing, PharmD, BCPS Clinical pharmacist  **Pharmacist phone directory can now be found on [email or web address] ([email or web address]).  Listed under [place].  03/31/2018 10:16 AM

## 2018-03-31 NOTE — Progress Notes (Addendum)
PULMONARY / CRITICAL CARE MEDICINE   Name: Amber Rogers MRN: [id number] DOB: 07/29/1941    ADMISSION DATE:  03/27/2018 CONSULTATION DATE:  03/27/2018  REFERRING MD:  Dr. Leonel Ramsay  CHIEF COMPLAINT:  ICH  BRIEF SUMMARY:  77 year old female with PMH of HTN, CKD, CHF (EF 55% and grade 2 DD in 2016), and COPD. She presented to [facility] ED 8/22 with complaints of headache, nausea, and vomiting with onset of symptoms around 1200. She was found to be hypertensive. CT scan demonstrated hematoma in the left cerebellum with early hydrocephalus. Initially no neurosurgical intervention was indicated due to her reasonable mental status and dual antiplatelet regimen, however, her condition continued to decline and she required intubation. She was then deemed a candidate for EVD placement. PCCM consulted for medical/vent/BP management.  SUBJECTIVE:  RN reports pt hemodynamically stable. No acute events noted overnight. Tmax 102.4. WBC increased to 24.8.  Received 1mg  versed at 2am.  Fentanyl gtt off since 8/25 at noon.   VITAL SIGNS: BP (!) 148/62   Pulse 86   Temp (!) 100.5 F (38.1 C) (Axillary) Comment: tylenol given  Resp 20   Ht 5\' 6"  (1.676 m)   Wt 66.1 kg   SpO2 96%   BMI 23.52 kg/m   HEMODYNAMICS:    VENTILATOR SETTINGS: Vent Mode: PSV;CPAP FiO2 (%):  [30 %-40 %] 40 % Set Rate:  [15 bmp] 15 bmp Vt Set:  [470 mL] 470 mL PEEP:  [5 cmH20] 5 cmH20 Pressure Support:  [5 cmH20-10 cmH20] 10 cmH20 Plateau Pressure:  [15 cmH20] 15 cmH20  INTAKE / OUTPUT: I/O last 3 completed shifts: In: 3038.6 [I.V.:188.6; NG/GT:2800; IV Piggyback:50] Out: 2438 [Urine:2240; Drains:198]  PHYSICAL EXAMINATION: General: critically ill appearing elderly female in NAD  HEENT: MM pink/moist, ETT, EVD drain with pink drainage to 15 mmHg Neuro: pupils 2-71mm, MAE spontaneously but not to command, withdraws to pain  CV: s1s2 rrr, no m/r/g PULM: even/non-labored, lungs bilaterally clear  NW:GNFA,  non-tender, bsx4 active  Extremities: warm/dry, no LE edema, 1+ in hands with brusing Skin: no rashes or lesions  LABS:  BMET Recent Labs  Lab 03/29/18 0558 03/30/18 0235 03/30/18 1646 03/31/18 0333  NA 138 140  --  138  K 3.6 5.3* 5.3* 3.7  CL 107 110  --  102  CO2 23 21*  --  25  BUN 27* 33*  --  39*  CREATININE 1.35* 1.43*  --  1.57*  GLUCOSE 135* 112*  --  144*   Electrolytes Recent Labs  Lab 03/29/18 0558 03/29/18 1708 03/30/18 0235 03/31/18 0333  CALCIUM 8.4*  --  8.3* 8.3*  MG 2.0 2.2 2.3 2.0  PHOS 3.0 3.6 3.6 4.5   CBC Recent Labs  Lab 03/29/18 0558 03/30/18 0235 03/31/18 0333  WBC 14.3* 16.2* 24.8*  HGB 9.8* 10.0* 10.5*  HCT 31.7* 34.4* 34.1*  PLT 282 312 359   Coag's Recent Labs  Lab 03/27/18 2253  INR 1.06   Sepsis Markers No results for input(s): LATICACIDVEN, PROCALCITON, O2SATVEN in the last 168 hours.  ABG Recent Labs  Lab 03/28/18 0442 03/30/18 0331 03/31/18 0325  PHART 7.379 7.270* 7.395  PCO2ART 39.7 49.4* 38.9  PO2ART 109.0* 85.0 85.0    Liver Enzymes Recent Labs  Lab 03/27/18 1403  AST 25  ALT 16  ALKPHOS 82  BILITOT 1.2  ALBUMIN 3.4*    Cardiac Enzymes Recent Labs  Lab 03/27/18 1403  TROPONINI <0.03    Glucose Recent Labs  Lab 03/30/18 1106 03/30/18 1539 03/30/18 2002 03/30/18 2346 03/31/18 0328 03/31/18 0808  GLUCAP 119* 136* 164* 146* 129* 145*    Imaging Dg Chest Port 1 View  Result Date: 03/31/2018 CLINICAL DATA:  Endotracheal tube EXAM: PORTABLE CHEST 1 VIEW COMPARISON:  03/30/2018 FINDINGS: Endotracheal tube in good position. NG tube in the stomach. Progression of bibasilar atelectasis. Negative for heart failure or edema or effusion. IMPRESSION: Endotracheal tube in good position. Progression of bibasilar atelectasis. Electronically Signed   By: Franchot Gallo M.D.   On: 03/31/2018 08:04     STUDIES:  CT head 8/22 > 2 cm hematoma in the deep left cerebellum with fourth ventricular  extension extending into the third/lateral ventricles and into the dorsal cervical canal. There is mild hydrocephalus at the lateral ventricles when compared to 2016. A deep cerebellar origin favors hypertensive etiology. CT angio head 8/22 > No vascular malformation or abnormal enhancement of the brain identified. Stable cerebellar and intraventricular hemorrhage. Patent anterior and posterior intracranial circulation. No large vessel occlusion, aneurysm, or significant stenosis. Calcific atherosclerosis of the carotid and vertebral arteries with mild stenosis.  CULTURES: BCx2 8/26 >>  Tracheal Aspirate 8/26 >>   ANTIBIOTICS: Vanco 8/26 >>  Cefepime 8/26 >>   SIGNIFICANT EVENTS: 8/22 Admit, EVD placed.   LINES/TUBES: ETT 8/22 > EVD 8/22 >  ASSESSMENT / PLAN:  Intracranial hemorrhage - 2cm L cerebellar hematoma with intraventricular extension. Secondary to hypertensive crisis. Initial pressures as high as 209/158.  Obstructive Hydrocephalus s/p EVD (8/22) P: SBP goal <160  Hold antiplatelets  Further management per Neurology / NSGY  Follow frequent neuro exams Minimize sedation as able   Acute Hypoxic Respiratory failure: Intubated for airway protection in the setting of intracranial hemorrhage P: PRVC 8 cc/kg  Wean on PSV as tolerated  Mental status remains barrier for extubation  Family feels patient would not want trach / PEG  Wean O2 for sats >90%  COPD - without acute exacerbation P: Continue brovana + pulmicort Duoneb Q6  Osteoarthritis P: Prednisone 5mg  QD (home med)  Chronic CHF P: ICU monitoring  Continue Norvasc, labetalol  PRN hydralazine   Non AG Metabolic acidosis due to Hyperchloremia - resolved P: Continue free water 250 ml Q4  Hyperkalemia P: Trend BMP   Leukocytosis  Fever - new 8/22 P: Obtain blood cultures  Follow WBC  CXR negative for infiltrate  ? Neuro fever vs active infection, begin empiric abx ? If need to culture CSF >  neuro discussed with NSGY Tylenol for fever > 101.5  Best Practice GI: PPI  DVT: SUP  Feeding: TF    CC Time: 30 minutes  Noe Gens, NP-C [facility] Pgr: [phone] or if no answer [phone] 03/31/2018, 9:29 AM

## 2018-03-31 NOTE — Progress Notes (Signed)
PT Cancellation Note  Patient Details Name: CYNETHIA SCHINDLER MRN: [id number] DOB: [birth year]   Cancelled Treatment:    Reason Eval/Treat Not Completed: Active bedrest order;PT screened, no needs identified, will sign off Per RN, pt not responsive to any stimuli at this time, not appropriate for PT services. Will sign off for now, please re consult when appropriate/able to participate. Thanks.   Marguarite Arbour A Burgundy Matuszak 03/31/2018, 12:03 PM Wray Kearns, [place], DPT [phone]

## 2018-03-31 NOTE — Progress Notes (Signed)
Neurosurgery Progress Note  Patient remains intubated Febrile over weekend. Tmax 102.4  EXAM:  BP (!) 165/70 (BP Location: Left Arm) Comment: will give hydralazine when eligible  Pulse 88   Temp (!) 100.5 F (38.1 C) (Axillary) Comment: tylenol given  Resp (!) 21   Ht 5\' 6"  (1.676 m)   Wt 66.1 kg   SpO2 98%   BMI 23.52 kg/m   Intubated Grimaces to painful stimulus. Minimal movement in extremities. EVD: Minimal output of clear/pink CSF. Fluctuating meniscus  IMPRESSION/PLAN 77 y.o. female with cerebellar hemorrhage with intraventricular extension, s/p EVD placement  - EVD remains functioning. - Febrile without source. Reviewed with attending. EVD in place for 4 days. She has had depressed mental status since arrival. Highly unlikely to be coming from EVD, especially this early on. Will hold on tapping for CSF presently.  - Continue management per PCCM and Neuro.

## 2018-03-31 NOTE — Progress Notes (Signed)
STROKE TEAM PROGRESS NOTE   INTERVAL HISTORY Her daughters are at the bedside.  Patient remained intubated but off sedation. However, still not following commands. Open eyes on pain stimulation and blinking to visual threat on the right. Withdraw to all extremities on pain. Had spiking fever yesterday and today, with worsening leukocytosis and Cre.    Vitals:   03/31/18 0839 03/31/18 0841 03/31/18 0842 03/31/18 0900  BP:    (!) 148/62  Pulse:    86  Resp:    20  Temp:      TempSrc:      SpO2: 98% 98% 98% 96%  Weight:      Height:        CBC:  Recent Labs  Lab 03/27/18 1403  03/30/18 0235 03/31/18 0333  WBC 14.9*   < > 16.2* 24.8*  NEUTROABS 12.2*  --   --   --   HGB 11.6*   < > 10.0* 10.5*  HCT 37.5   < > 34.4* 34.1*  MCV 86.4   < > 92.0 88.1  PLT 308   < > 312 359   < > = values in this interval not displayed.    Basic Metabolic Panel:  Recent Labs  Lab 03/30/18 0235 03/30/18 1646 03/31/18 0333  NA 140  --  138  K 5.3* 5.3* 3.7  CL 110  --  102  CO2 21*  --  25  GLUCOSE 112*  --  144*  BUN 33*  --  39*  CREATININE 1.43*  --  1.57*  CALCIUM 8.3*  --  8.3*  MG 2.3  --  2.0  PHOS 3.6  --  4.5   Lipid Panel:     Component Value Date/Time   CHOL 177 03/30/2018 0235   TRIG 67 03/30/2018 2248   HDL 101 03/30/2018 0235   CHOLHDL 1.8 03/30/2018 0235   VLDL 10 03/30/2018 0235   LDLCALC 66 03/30/2018 0235   HgbA1c:  Lab Results  Component Value Date   HGBA1C 5.5 03/30/2018   Urine Drug Screen:     Component Value Date/Time   LABOPIA NONE DETECTED 03/29/2018 1110   COCAINSCRNUR NONE DETECTED 03/29/2018 1110   LABBENZ NONE DETECTED 03/29/2018 1110   AMPHETMU NONE DETECTED 03/29/2018 1110   THCU NONE DETECTED 03/29/2018 1110   LABBARB NONE DETECTED 03/29/2018 1110    Alcohol Level     Component Value Date/Time   ETH <10 03/27/2018 1354    IMAGING  Ct Angio Head W Or Wo Contrast 03/27/2018 IMPRESSION:  1. No vascular malformation or abnormal  enhancement of the brain identified.  2. Stable cerebellar and intraventricular hemorrhage.  3. Patent anterior and posterior intracranial circulation. No large vessel occlusion, aneurysm, or significant stenosis.  4. Calcific atherosclerosis of the carotid and vertebral arteries with mild stenosis.    Ct Head Wo Contrast 03/28/2018 IMPRESSION:  1. Stable volume of intracranial hemorrhage within the left cerebellar hemisphere and the ventricular system. Some redistribution of blood products to the lateral ventricle atria and sylvian fissures.  2. No new acute intracranial abnormality.  3. Interval right frontal approach ventriculostomy catheter placement. Mild decrease in hydrocephalus.    Ct Head Wo Contrast 03/27/2018 IMPRESSION:  2 cm hematoma in the deep left cerebellum with fourth ventricular extension extending into the third/lateral ventricles and into the dorsal cervical canal. There is mild hydrocephalus at the lateral ventricles when compared to 2016. A deep cerebellar origin favors hypertensive etiology.   CUS 1-39% ICA  stenosis.  Vertebral artery flow is antegrade. Significant calcific plaque bilateral bifurcations, however, velocities are not significantly increased.   TTE - Left ventricle: The cavity size was normal. Wall thickness was   increased in a pattern of mild LVH. Systolic function was normal.   The estimated ejection fraction was in the range of 60% to 65%.   Doppler parameters are consistent with both elevated ventricular   end-diastolic filling pressure and elevated left atrial filling   pressure. - Aortic valve: There was mild stenosis. Valve area (VTI): 1.88   cm^2. Valve area (Vmax): 1.82 cm^2. Valve area (Vmean): 1.59   cm^2. - Mitral valve: Calcified annulus. - Atrial septum: No defect or patent foramen ovale was identified.  Ct Head Wo Contrast  Result Date: 03/30/2018 CLINICAL DATA:  77 y/o F; follow-up for intracranial hemorrhage, intraventricular  hemorrhage, and hydrocephalus. EXAM: CT HEAD WITHOUT CONTRAST TECHNIQUE: Contiguous axial images were obtained from the base of the skull through the vertex without intravenous contrast. COMPARISON:  03/28/2018 and 03/27/2018 CT head. FINDINGS: Brain: Stable volume acute hemorrhage within the left medial cerebellar hemisphere, ventricular system, and subarachnoid spaces of the sylvian fissures. There is some interval redistribution, for example decreased hemorrhage within the frontal horns of lateral ventricles, and increased pooling in the occipital horns. No new acute intracranial hemorrhage, stroke, or mass effect. Right frontal approach ventriculostomy catheter is stable in position traversing the frontal horn of right lateral ventricle. Stable ventricle size. Vascular: Calcific atherosclerosis of carotid siphons and vertebral arteries. Skull: Postsurgical changes related to a right frontal approach ventriculostomy catheter with decreased edema in the scalp. Sinuses/Orbits: Intubated patient. Maxillary sinus mucous retention cysts. Normal aeration of mastoid air cells. Orbits are unremarkable. Other: None. IMPRESSION: Stable volume of intraventricular, subarachnoid, and left cerebellar hemorrhage. Stable ventricle size. No new acute intracranial abnormality. Electronically Signed   By: Kristine Garbe M.D.   On: 03/30/2018 05:12   Dg Chest Port 1 View  Result Date: 03/30/2018 CLINICAL DATA:  Endotracheal tube placement. EXAM: PORTABLE CHEST 1 VIEW COMPARISON:  Radiograph of March 28, 2018. FINDINGS: Stable cardiomediastinal silhouette. Endotracheal and nasogastric tubes are unchanged in position. No pneumothorax or pleural effusion is noted. Right lung is clear. Mild left basilar subsegmental atelectasis is noted. Bony thorax is unremarkable. IMPRESSION: Mild left basilar subsegmental atelectasis. Stable support apparatus. Electronically Signed   By: Marijo Conception, M.D.   On: 03/30/2018 07:26      PHYSICAL EXAM  elderly lady who is intubated and sedated. She has a right frontal ventriculostomy catheter. . Afebrile. Head is nontraumatic. Neck is supple without bruit.    Cardiac exam no murmur or gallop. Lungs are clear to auscultation. Distal pulses are well felt. Neurological Exam :  Intubated, eyes closed, off sedation. She slowly opened eyes to pain stimulation, does not follow commands. Pupils are small 2 mm pinpoint and sluggishly reactive. Eyes right gazed forced. Blinking to visual threat on the right but not on the left. Corneal reflexes are brinsk. She is on CPAP and tolerating well. She has a cough and gag. Motor system exam she is able to withdraw all 4 extremities with painful stimuli but does not follow commands or against gravity. Deep tendon reflexes are 3+ UEs and 1+ LEs symmetric, but babinski positive bilaterally. Startle response resolved, and myoclonus with UE flexion movement on the left. Increased muscle tone LUE. Sensation, coordination and gait not tested.    ASSESSMENT/PLAN Ms. Amber Rogers is a 77 y.o. female with  history of COPD, CHF, HTN, HLD, CKD III, CAD s/p stent presenting with sudden onset nausea, vomiting and dizziness.   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  Mild hydrocephalus lateral ventricles.    CTA head no vascular malformation or abnormal enhancement.  Stable cerebellar and IVH.  No LVO.  Intracranial atherosclerosis.  Repeat CT - Stable volume of intracranial hemorrhage  Repeat CT - stable, no acute abnormalities.  Repeat CT in am  CUS - Significant calcific plaque bilateral bifurcations, however, velocities are not  significantly increased.  2D Echo EF 60-65%  UDS neg  LDL 66  HgbA1c 5.5  Heparin subq for VTE prophylaxis  NPO on TF @ 50cc/h  aspirin 81 mg daily and Brilinta 90 mg twice daily prior to admission, now on no antithrombotics  Therapy recommendations:  Pending  Disposition:  pending   Encephalopathy   Depressed mental status even off sedation  myoclonus with LUE flexion  AKI on CKD - Cre 1.36-1.33-1.35-1.43->1.57  Leukocytosis - WBC 14.0->16.2->24.8  Overnight one spiking fever 101.1->102.4  CXR - bilateral basilar atelectasis  UA WBC 6-10  Discussed with NSG Dr. Kathyrn Sheriff - not concerning for ventriculitis as this time  Discussed with CCM to add empirical antibiotics.   Hyperkalemia   K 3.6->5.3->3.7  Finished kayexalate 30g bid for 2 doses  BMP monitoring daily   Carotid stenosis   CUS 08/2017 R ICA stenosis 50-69%, L ICA < 50%  CUS repeat showed b/l ICA 1-39% stenosis but significant calcified plaque at carotid bifurcation bilaterally.   Obstructive hydrocephalus s/p R frontal EVD   EVD placed 8/22  Patent now on 15cmH2O  Minimal OP  NSG on board  Repeat CT head 03/30/18 stable without hydrocephalus  Discussed with NSG Dr. Kathyrn Sheriff - not concerning for ventriculitis as this time  Acute Hypoxic Respiratory failure  Secondary to stroke  Intubated in the ED with neuro decline   off propofol and Fentanyl   Still depressed mental status  CCM on board  Wean off vent as able  Hypertensive Emergency  BP stable off cardene  Home Meds: Norvasc 10, resumed  Add labetalol 100mg  tid  SBP goal < 160  Hyperlipidemia  Home meds:  ?? Lipitor 80   LDL 66  Consider to resume lipitor on discharge  Other Stroke Risk Factors  Advanced age  Former Cigarette smoker, quit 2 years ago  Coronary artery disease s/p stent - on ASA and brilinta PTA  Chronic Congestive heart failure  Other Active Problems  Hx vertigo   COPD - on home  prednisone per tube  Leukocytosis - 14.9 ->14.0->14.3->16.2->24.8  CKD stage III - Creatinine - 1.35->1.43->1.57  Hospital day # 4  This patient is critically ill due to cerebellar ICH with IVH and hydrocephalus, respiratory failure, hypertensive emergency and at significant risk of neurological worsening, death form recurrent bleeding, hydrocephalus, increased ICP, cerebral edema and brain death, seizure. This patient's care requires constant monitoring of vital signs, hemodynamics, respiratory and cardiac monitoring, review of multiple databases, neurological assessment, discussion with family, other specialists and medical decision making of high complexity. I spent 45 minutes of neurocritical care time in the care of this patient. I had long discussion with daughters at bedside, updated pt  current condition, treatment plan and potential prognosis. They expressed understanding and appreciation.   Rosalin Hawking, MD PhD Stroke Neurology 03/31/2018 9:48 AM   To contact Stroke Continuity provider, please refer to [email or web address] After hours, contact General Neurology

## 2018-04-01 ENCOUNTER — Inpatient Hospital Stay (HOSPITAL_COMMUNITY): Payer: Medicare Other

## 2018-04-01 LAB — BASIC METABOLIC PANEL
ANION GAP: 10 (ref 5–15)
BUN: 42 mg/dL — ABNORMAL HIGH (ref 8–23)
CALCIUM: 8.2 mg/dL — AB (ref 8.9–10.3)
CO2: 26 mmol/L (ref 22–32)
Chloride: 102 mmol/L (ref 98–111)
Creatinine, Ser: 1.25 mg/dL — ABNORMAL HIGH (ref 0.44–1.00)
GFR calc Af Amer: 48 mL/min — ABNORMAL LOW (ref >60)
GFR calc non Af Amer: 41 mL/min — ABNORMAL LOW (ref >60)
GLUCOSE: 115 mg/dL — AB (ref 70–99)
Potassium: 3 mmol/L — ABNORMAL LOW (ref 3.5–5.1)
Sodium: 138 mmol/L (ref 135–145)

## 2018-04-01 LAB — CBC
HEMATOCRIT: 29.3 % — AB (ref 36.0–46.0)
Hemoglobin: 9.1 g/dL — ABNORMAL LOW (ref 12.0–15.0)
MCH: 27.2 pg (ref 26.0–34.0)
MCHC: 31.1 g/dL (ref 30.0–36.0)
MCV: 87.7 fL (ref 78.0–100.0)
Platelets: 316 10*3/uL (ref 150–400)
RBC: 3.34 MIL/uL — ABNORMAL LOW (ref 3.87–5.11)
RDW: 15.7 % — AB (ref 11.5–15.5)
WBC: 19.2 10*3/uL — AB (ref 4.0–10.5)

## 2018-04-01 LAB — GLUCOSE, CAPILLARY
GLUCOSE-CAPILLARY: 115 mg/dL — AB (ref 70–99)
Glucose-Capillary: 121 mg/dL — ABNORMAL HIGH (ref 70–99)
Glucose-Capillary: 148 mg/dL — ABNORMAL HIGH (ref 70–99)
Glucose-Capillary: 181 mg/dL — ABNORMAL HIGH (ref 70–99)
Glucose-Capillary: 129 mg/dL — ABNORMAL HIGH (ref 70–99)

## 2018-04-01 MED ORDER — POTASSIUM CHLORIDE 20 MEQ/15ML (10%) PO SOLN
40.0000 meq | Freq: Once | ORAL | Status: AC
  Administered 2018-04-01: 40 meq
  Filled 2018-04-01: qty 30

## 2018-04-01 MED ORDER — LABETALOL HCL 100 MG PO TABS
200.0000 mg | ORAL_TABLET | Freq: Three times a day (TID) | ORAL | Status: DC
Start: 2018-04-01 — End: 2018-04-06
  Administered 2018-04-01 – 2018-04-05 (×13): 200 mg via ORAL
  Filled 2018-04-01 (×13): qty 2

## 2018-04-01 MED ORDER — MODAFINIL 100 MG PO TABS
100.0000 mg | ORAL_TABLET | Freq: Every day | ORAL | Status: DC
  Administered 2018-04-02 – 2018-04-09 (×8): 100 mg via ORAL
  Filled 2018-04-01 (×7): qty 1

## 2018-04-01 MED ORDER — POTASSIUM CHLORIDE 20 MEQ/15ML (10%) PO SOLN
40.0000 meq | ORAL | Status: AC
  Administered 2018-04-01 (×2): 40 meq
  Filled 2018-04-01 (×2): qty 30

## 2018-04-01 MED ORDER — PANTOPRAZOLE SODIUM 40 MG PO PACK
40.0000 mg | PACK | Freq: Every day | ORAL | Status: DC
  Administered 2018-04-01 – 2018-04-17 (×16): 40 mg
  Filled 2018-04-01 (×16): qty 20

## 2018-04-01 MED ORDER — VANCOMYCIN HCL IN DEXTROSE 1-5 GM/200ML-% IV SOLN
1000.0000 mg | INTRAVENOUS | Status: DC
  Administered 2018-04-01 – 2018-04-04 (×4): 1000 mg via INTRAVENOUS
  Filled 2018-04-01 (×4): qty 200

## 2018-04-01 NOTE — Progress Notes (Signed)
STROKE TEAM PROGRESS NOTE   INTERVAL HISTORY Her daughters are at the bedside.  Patient remained intubated off sedation. seems more spontaneous movement than yesterday. On weaning trials, tolerating well. Cre trending down as well as WBC. Still has low grade fever, on empiric Abx. Repeat CT stable.   Vitals:   04/01/18 0800 04/01/18 0854 04/01/18 0900 04/01/18 0904  BP: (!) 160/55 (!) 166/60 (!) 155/54 (!) 166/70  Pulse: 68  68 68  Resp: 18  16 17   Temp:      TempSrc:      SpO2: 98%  99% 99%  Weight:      Height:        CBC:  Recent Labs  Lab 03/27/18 1403  03/31/18 0333 04/01/18 0404  WBC 14.9*   < > 24.8* 19.2*  NEUTROABS 12.2*  --   --   --   HGB 11.6*   < > 10.5* 9.1*  HCT 37.5   < > 34.1* 29.3*  MCV 86.4   < > 88.1 87.7  PLT 308   < > 359 316   < > = values in this interval not displayed.    Basic Metabolic Panel:  Recent Labs  Lab 03/30/18 0235  03/31/18 0333 04/01/18 0404  NA 140  --  138 138  K 5.3*   < > 3.7 3.0*  CL 110  --  102 102  CO2 21*  --  25 26  GLUCOSE 112*  --  144* 115*  BUN 33*  --  39* 42*  CREATININE 1.43*  --  1.57* 1.25*  CALCIUM 8.3*  --  8.3* 8.2*  MG 2.3  --  2.0  --   PHOS 3.6  --  4.5  --    < > = values in this interval not displayed.   Lipid Panel:     Component Value Date/Time   CHOL 177 03/30/2018 0235   TRIG 67 03/30/2018 2248   HDL 101 03/30/2018 0235   CHOLHDL 1.8 03/30/2018 0235   VLDL 10 03/30/2018 0235   LDLCALC 66 03/30/2018 0235   HgbA1c:  Lab Results  Component Value Date   HGBA1C 5.5 03/30/2018   Urine Drug Screen:     Component Value Date/Time   LABOPIA NONE DETECTED 03/29/2018 1110   COCAINSCRNUR NONE DETECTED 03/29/2018 1110   LABBENZ NONE DETECTED 03/29/2018 1110   AMPHETMU NONE DETECTED 03/29/2018 1110   THCU NONE DETECTED 03/29/2018 1110   LABBARB NONE DETECTED 03/29/2018 1110    Alcohol Level     Component Value Date/Time   ETH <10 03/27/2018 1354    IMAGING  Ct Angio Head W Or Wo  Contrast 03/27/2018 IMPRESSION:  1. No vascular malformation or abnormal enhancement of the brain identified.  2. Stable cerebellar and intraventricular hemorrhage.  3. Patent anterior and posterior intracranial circulation. No large vessel occlusion, aneurysm, or significant stenosis.  4. Calcific atherosclerosis of the carotid and vertebral arteries with mild stenosis.    Ct Head Wo Contrast 03/28/2018 IMPRESSION:  1. Stable volume of intracranial hemorrhage within the left cerebellar hemisphere and the ventricular system. Some redistribution of blood products to the lateral ventricle atria and sylvian fissures.  2. No new acute intracranial abnormality.  3. Interval right frontal approach ventriculostomy catheter placement. Mild decrease in hydrocephalus.    Ct Head Wo Contrast 03/27/2018 IMPRESSION:  2 cm hematoma in the deep left cerebellum with fourth ventricular extension extending into the third/lateral ventricles and into the dorsal cervical canal.  There is mild hydrocephalus at the lateral ventricles when compared to 2016. A deep cerebellar origin favors hypertensive etiology.   CUS 1-39% ICA stenosis.  Vertebral artery flow is antegrade. Significant calcific plaque bilateral bifurcations, however, velocities are not significantly increased.   TTE - Left ventricle: The cavity size was normal. Wall thickness was   increased in a pattern of mild LVH. Systolic function was normal.   The estimated ejection fraction was in the range of 60% to 65%.   Doppler parameters are consistent with both elevated ventricular   end-diastolic filling pressure and elevated left atrial filling   pressure. - Aortic valve: There was mild stenosis. Valve area (VTI): 1.88   cm^2. Valve area (Vmax): 1.82 cm^2. Valve area (Vmean): 1.59   cm^2. - Mitral valve: Calcified annulus. - Atrial septum: No defect or patent foramen ovale was identified.  Ct Head Wo Contrast  Result Date:  03/30/2018 CLINICAL DATA:  78 y/o F; follow-up for intracranial hemorrhage, intraventricular hemorrhage, and hydrocephalus. EXAM: CT HEAD WITHOUT CONTRAST TECHNIQUE: Contiguous axial images were obtained from the base of the skull through the vertex without intravenous contrast. COMPARISON:  03/28/2018 and 03/27/2018 CT head. FINDINGS: Brain: Stable volume acute hemorrhage within the left medial cerebellar hemisphere, ventricular system, and subarachnoid spaces of the sylvian fissures. There is some interval redistribution, for example decreased hemorrhage within the frontal horns of lateral ventricles, and increased pooling in the occipital horns. No new acute intracranial hemorrhage, stroke, or mass effect. Right frontal approach ventriculostomy catheter is stable in position traversing the frontal horn of right lateral ventricle. Stable ventricle size. Vascular: Calcific atherosclerosis of carotid siphons and vertebral arteries. Skull: Postsurgical changes related to a right frontal approach ventriculostomy catheter with decreased edema in the scalp. Sinuses/Orbits: Intubated patient. Maxillary sinus mucous retention cysts. Normal aeration of mastoid air cells. Orbits are unremarkable. Other: None. IMPRESSION: Stable volume of intraventricular, subarachnoid, and left cerebellar hemorrhage. Stable ventricle size. No new acute intracranial abnormality. Electronically Signed   By: Kristine Garbe M.D.   On: 03/30/2018 05:12    Ct Head Wo Contrast  Result Date: 04/01/2018 CLINICAL DATA:  77 y/o  F; follow-up of intracranial hemorrhage. EXAM: CT HEAD WITHOUT CONTRAST TECHNIQUE: Contiguous axial images were obtained from the base of the skull through the vertex without intravenous contrast. COMPARISON:  03/30/2018 CT head.  09/24/2014 CT head. FINDINGS: Brain: Partial interval dispersion of intraventricular, subarachnoid, and left cerebellar brain parenchymal hemorrhage. No new acute intracranial  hemorrhage, stroke, or focal mass effect. Decreased size of the lateral and third ventricles, ventricle size now similar to 2016 CT head. Stable position of right frontal approach ventriculostomy catheter with tip in the right lateral ventricle near the foramen of Monro. Vascular: Calcific atherosclerosis of carotid siphons and vertebral arteries. No hyperdense vessel identified. Skull: Right frontal burr hole postsurgical changes. Torus palatini and maxillaris. Sinuses/Orbits: Endotracheal and enteric tubes. Left maxillary sinus small mucous retention cyst. Visualized paranasal sinuses and mastoid air cells are otherwise normally aerated. Orbits are unremarkable. Other: None. IMPRESSION: 1. Partial interval dispersion of intraventricular, subarachnoid, and left cerebellar brain parenchymal hemorrhage. 2. Resolution of hydrocephalus. Stable position of right frontal approach ventriculostomy catheter. 3. No new acute intracranial process. Electronically Signed   By: Kristine Garbe M.D.   On: 04/01/2018 05:26   Dg Chest Port 1 View  Result Date: 04/01/2018 CLINICAL DATA:  Ventilator support EXAM: PORTABLE CHEST 1 VIEW COMPARISON:  03/31/2018 FINDINGS: Endotracheal tube tip is 2.5 cm above the carina. Nasogastric  tube enters the stomach. Atelectasis in both lower lobes appears similar. Upper lungs remain clear. IMPRESSION: Endotracheal tube and nasogastric tube unchanged in well-positioned. Persistent atelectasis in both lower lobes. Electronically Signed   By: Nelson Chimes M.D.   On: 04/01/2018 09:08     PHYSICAL EXAM  elderly lady who is intubated and sedated. She has a right frontal ventriculostomy catheter. . Afebrile. Head is nontraumatic. Neck is supple without bruit.    Cardiac exam no murmur or gallop. Lungs are clear to auscultation. Distal pulses are well felt. Neurological Exam :  Intubated, eyes closed, off sedation. Not open eyes on pain stimulation but grimace, does not follow  commands. Pupils are small 2 mm pinpoint and reactive to light. Eyes right gazed preference, able to roll to the right but briefly. Blinking to visual threat on the right but not on the left. Corneal reflexes are brinsk. She is on CPAP and tolerating well. She has a cough and gag. Motor system exam she is able to withdraw all 4 extremities with painful stimuli but does not follow commands, able to against gravity BUEs but BLEs 2/5. Deep tendon reflexes are 3+ UEs and 1+ LEs symmetric, but babinski positive bilaterally. Sensation, coordination and gait not tested.    ASSESSMENT/PLAN Ms. NAYLA DIAS is a 77 y.o. female with history of COPD, CHF, HTN, HLD, CKD III, CAD s/p stent presenting with sudden onset nausea, vomiting and dizziness.   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  Mild hydrocephalus lateral ventricles.    CTA head no vascular malformation or abnormal enhancement.  Stable cerebellar and IVH.  No LVO.  Intracranial atherosclerosis.  Repeat CT x 3 - Stable volume of intracranial hemorrhage and hydrocephalus  CUS - Significant calcific plaque bilateral bifurcations, however, velocities are not significantly increased.  2D Echo EF 60-65%  UDS neg  LDL 66  HgbA1c 5.5  Heparin subq for VTE prophylaxis  NPO on TF @ 50cc/h  aspirin 81 mg daily and Brilinta 90 mg twice daily prior to admission, now on no antithrombotics  Therapy recommendations:  Pending  Disposition:  pending   Encephalopathy   Depressed mental status even off sedation  Could be due to brainstem irritation with 4th IVH  AKI on CKD - Cre 1.36-1.33-1.35-1.43->1.57->1.25  Leukocytosis -  WBC 14.0->16.2->24.8->19.2  fever Tmax 101.1->102.4->100.5  CXR - bilateral basilar atelectasis  UA WBC 6-10  Discussed with NSG Dr. Kathyrn Sheriff - not concerning for ventriculitis as this time  On vanco/cefepime for empirical antibiotics.   Hyperkalemia   K 3.6->5.3->3.7->3.0   Supplement  Check magnesium in am  BMP monitoring daily   Carotid stenosis   CUS 08/2017 R ICA stenosis 50-69%, L ICA < 50%  CUS repeat showed b/l ICA 1-39% stenosis but significant calcified plaque at carotid bifurcation bilaterally.   Obstructive hydrocephalus s/p R frontal EVD   EVD placed 8/22  Patent now on 15cmH2O  Minimal OP  NSG on board  Repeat CT head showed resolution of hydrocephalus  Discussed with NSG Dr. Kathyrn Sheriff - not concerning for ventriculitis as this time  Acute Hypoxic Respiratory failure  Secondary to stroke  Intubated in the ED with neuro decline   off propofol and Fentanyl  Still depressed mental status  CCM on board  Tolerating weaning trials well  Hypertensive Emergency  BP stable off cardene  Home Meds: Norvasc 10, resumed  increase labetalol to 200mg  tid  SBP goal < 160  Hyperlipidemia  Home meds:  ?? Lipitor 80   LDL 66  Consider to resume lipitor on discharge  Other Stroke Risk Factors  Advanced age  Former Cigarette smoker, quit 2 years ago  Coronary artery disease s/p stent - on ASA and brilinta PTA  Chronic Congestive heart failure  Other Active Problems  Hx vertigo   COPD - on home prednisone per tube  Leukocytosis - 14.9 ->14.0->14.3->16.2->24.8->19.2  CKD stage III - Creatinine - 1.35->1.43->1.57->1.25  Hospital day # 5  This patient is critically ill due to cerebellar ICH with IVH and hydrocephalus, respiratory failure, hypertensive emergency and at significant risk of neurological worsening, death form recurrent bleeding, hydrocephalus, increased ICP, cerebral edema and brain death, seizure. This patient's care  requires constant monitoring of vital signs, hemodynamics, respiratory and cardiac monitoring, review of multiple databases, neurological assessment, discussion with family, other specialists and medical decision making of high complexity. I spent 45 minutes of neurocritical care time in the care of this patient. I had long discussion with daughters at bedside, updated pt current condition, treatment plan and potential prognosis. They expressed understanding and appreciation. They would like to continue observation at this time.   Rosalin Hawking, MD PhD Stroke Neurology 04/01/2018 10:32 AM   To contact Stroke Continuity provider, please refer to [email or web address] After hours, contact General Neurology

## 2018-04-01 NOTE — Progress Notes (Signed)
SLP Cancellation Note  Patient Details Name: Amber Rogers MRN: [id number] DOB: 1940/11/26   Cancelled treatment:       Reason Eval/Treat Not Completed: Medical issues which prohibited therapy. Will sign off at this time.    Drue Harr, Katherene Ponto 04/01/2018, 7:43 AM

## 2018-04-01 NOTE — Progress Notes (Signed)
Patient transported from 4N30 to CT and back without any complications. RT suctioned patient removing small amount of tan thick secretions.

## 2018-04-01 NOTE — Progress Notes (Signed)
Pharmacy Antibiotic Note  Amber Rogers is a 77 y.o. female admitted on 03/27/2018 with L cerebellum hematoma which required EVD placement.  Pharmacy consulted for Cefepime and Vancomyin dosing for empiric coverage of possible EVD infection in patient with fever up to 102 on 8/26.  Tmax 100.5 past 24 hours, WBC 24.8->19.2 (on pred 5mg  as PTA).   SCr back down to 1.25 ( trending up from 1.3 on admission)  Plan: Change Vancomycin to 1000mg  IV q24h Continue Cefepime 1gm IV q24h Will f/u renal function, micro data, and pt's clinical condition  Height: 5\' 6"  (167.6 cm) Weight: 147 lb 4.3 oz (66.8 kg) IBW/kg (Calculated) : 59.3  Temp (24hrs), Avg:99 F (37.2 C), Min:98.4 F (36.9 C), Max:99.6 F (37.6 C)  Recent Labs  Lab 03/28/18 0645 03/29/18 0558 03/30/18 0235 03/31/18 0333 04/01/18 0404  WBC 14.0* 14.3* 16.2* 24.8* 19.2*  CREATININE 1.33* 1.35* 1.43* 1.57* 1.25*    Estimated Creatinine Clearance: 37 mL/min (A) (by C-G formula based on SCr of 1.25 mg/dL (H)).    No Known Allergies  Antimicrobials this admission: 8/26 Cefepime >>  8/26 Vancomycin >>   Dose adjustments this admission:   Microbiology results: 8/25 BCx:  8/26 Trach asp:   8/23 MRSA PCR: negative  Thank you for allowing pharmacy to be a part of this patient's care.  Sherlon Handing, PharmD, BCPS Clinical pharmacist  **Pharmacist phone directory can now be found on [email or web address] ([email or web address]).  Listed under [place].  04/01/2018 9:53 AM

## 2018-04-01 NOTE — Progress Notes (Signed)
EVD remains functional with minimal clear pink CSF and fluctuating meniscus. Continue current care by Neuro/PCCM.

## 2018-04-01 NOTE — Progress Notes (Addendum)
PULMONARY / CRITICAL CARE MEDICINE   Name: Amber Rogers MRN: [id number] DOB: Apr 10, 1941    ADMISSION DATE:  03/27/2018 CONSULTATION DATE:  03/27/2018  REFERRING MD:  Dr. Leonel Ramsay  CHIEF COMPLAINT:  ICH  BRIEF SUMMARY:  77 year old female with PMH of HTN, CKD, CHF (EF 55% and grade 2 DD in 2016), and COPD. She presented to [facility] ED 8/22 with complaints of headache, nausea, and vomiting with onset of symptoms around 1200. She was found to be hypertensive. CT scan demonstrated hematoma in the left cerebellum with early hydrocephalus. Initially no neurosurgical intervention was indicated due to her reasonable mental status and dual antiplatelet regimen, however, her condition continued to decline and she required intubation. She was then deemed a candidate for EVD placement. PCCM consulted for medical/vent/BP management.  SUBJECTIVE:  Tmax 100.5 / WBC 19.2.  4L+ for admit.  RN reports no acute events.  BP elevated, pending hydralazine IVP.   VITAL SIGNS: BP (!) 156/58   Pulse 69   Temp 99.2 F (37.3 C) (Axillary)   Resp 17   Ht 5\' 6"  (1.676 m)   Wt 66.8 kg   SpO2 98%   BMI 23.77 kg/m   HEMODYNAMICS:    VENTILATOR SETTINGS: Vent Mode: PRVC FiO2 (%):  [30 %-40 %] 30 % Set Rate:  [15 bmp-20 bmp] 15 bmp Vt Set:  [480 mL-490 mL] 490 mL PEEP:  [5 cmH20] 5 cmH20 Pressure Support:  [8 cmH20-10 cmH20] 8 cmH20 Plateau Pressure:  [13 cmH20-16 cmH20] 13 cmH20  INTAKE / OUTPUT: I/O last 3 completed shifts: In: 4400.9 [I.V.:1000.3; NG/GT:3120; IV Piggyback:280.6] Out: 2231 [Urine:1985; Drains:246]  PHYSICAL EXAMINATION: General: ill appearing elderly female in NAD lying in bed  HEENT: MM pink/moist, ETT, R IVD in place with pink drainage  Neuro: pupils 2-81mm, no follow commands but moves all extremities with stimulation  CV: s1s2 rrr, no m/r/g PULM: even/non-labored, lungs bilaterally clear  NW:GNFA, non-tender, bsx4 active  Extremities: warm/dry, no LE edema, mild edema in  hands  Skin: no rashes or lesions, bruising to hands / arms  LABS:  BMET Recent Labs  Lab 03/30/18 0235 03/30/18 1646 03/31/18 0333 04/01/18 0404  NA 140  --  138 138  K 5.3* 5.3* 3.7 3.0*  CL 110  --  102 102  CO2 21*  --  25 26  BUN 33*  --  39* 42*  CREATININE 1.43*  --  1.57* 1.25*  GLUCOSE 112*  --  144* 115*   Electrolytes Recent Labs  Lab 03/29/18 1708 03/30/18 0235 03/31/18 0333 04/01/18 0404  CALCIUM  --  8.3* 8.3* 8.2*  MG 2.2 2.3 2.0  --   PHOS 3.6 3.6 4.5  --    CBC Recent Labs  Lab 03/30/18 0235 03/31/18 0333 04/01/18 0404  WBC 16.2* 24.8* 19.2*  HGB 10.0* 10.5* 9.1*  HCT 34.4* 34.1* 29.3*  PLT 312 359 316   Coag's Recent Labs  Lab 03/27/18 2253  INR 1.06   Sepsis Markers No results for input(s): LATICACIDVEN, PROCALCITON, O2SATVEN in the last 168 hours.  ABG Recent Labs  Lab 03/28/18 0442 03/30/18 0331 03/31/18 0325  PHART 7.379 7.270* 7.395  PCO2ART 39.7 49.4* 38.9  PO2ART 109.0* 85.0 85.0    Liver Enzymes Recent Labs  Lab 03/27/18 1403  AST 25  ALT 16  ALKPHOS 82  BILITOT 1.2  ALBUMIN 3.4*    Cardiac Enzymes Recent Labs  Lab 03/27/18 1403  TROPONINI <0.03    Glucose Recent  Labs  Lab 03/31/18 1134 03/31/18 1614 03/31/18 1944 03/31/18 2331 04/01/18 0350 04/01/18 0751  GLUCAP 159* 146* 144* 133* 121* 148*    Imaging Ct Head Wo Contrast  Result Date: 04/01/2018 CLINICAL DATA:  77 y/o  F; follow-up of intracranial hemorrhage. EXAM: CT HEAD WITHOUT CONTRAST TECHNIQUE: Contiguous axial images were obtained from the base of the skull through the vertex without intravenous contrast. COMPARISON:  03/30/2018 CT head.  09/24/2014 CT head. FINDINGS: Brain: Partial interval dispersion of intraventricular, subarachnoid, and left cerebellar brain parenchymal hemorrhage. No new acute intracranial hemorrhage, stroke, or focal mass effect. Decreased size of the lateral and third ventricles, ventricle size now similar to 2016  CT head. Stable position of right frontal approach ventriculostomy catheter with tip in the right lateral ventricle near the foramen of Monro. Vascular: Calcific atherosclerosis of carotid siphons and vertebral arteries. No hyperdense vessel identified. Skull: Right frontal burr hole postsurgical changes. Torus palatini and maxillaris. Sinuses/Orbits: Endotracheal and enteric tubes. Left maxillary sinus small mucous retention cyst. Visualized paranasal sinuses and mastoid air cells are otherwise normally aerated. Orbits are unremarkable. Other: None. IMPRESSION: 1. Partial interval dispersion of intraventricular, subarachnoid, and left cerebellar brain parenchymal hemorrhage. 2. Resolution of hydrocephalus. Stable position of right frontal approach ventriculostomy catheter. 3. No new acute intracranial process. Electronically Signed   By: Kristine Garbe M.D.   On: 04/01/2018 05:26     STUDIES:  CT head 8/22 > 2 cm hematoma in the deep left cerebellum with fourth ventricular extension extending into the third/lateral ventricles and into the dorsal cervical canal. There is mild hydrocephalus at the lateral ventricles when compared to 2016. A deep cerebellar origin favors hypertensive etiology. CT angio head 8/22 > No vascular malformation or abnormal enhancement of the brain identified. Stable cerebellar and intraventricular hemorrhage. Patent anterior and posterior intracranial circulation. No large vessel occlusion, aneurysm, or significant stenosis. Calcific atherosclerosis of the carotid and vertebral arteries with mild stenosis. CT Head w/o 8/27 >  Partial interval dispersion of intraventricular, subarachnoid and left cerebellar brain parenchymal hemorrhage.  Resolution of hydrocephalus.  Stable position of right frontal approach ventric catheter, no new acute process.   CULTURES: BCx2 8/26 >>  Tracheal Aspirate 8/26 >>   ANTIBIOTICS: Vanco 8/26 >>  Cefepime 8/26 >>   SIGNIFICANT  EVENTS: 8/22 Admit, EVD placed.  8/26 Fever, empiric abx added   LINES/TUBES: ETT 8/22 > EVD 8/22 >  ASSESSMENT / PLAN:  Intracranial hemorrhage - 2cm L cerebellar hematoma with intraventricular extension. Secondary to hypertensive crisis. Initial pressures as high as 209/158.  Obstructive Hydrocephalus s/p EVD (8/22) P: Goal SBP <160  Management per Neurology / NSGY  Hold antiplatelets  Follow neuro exam  Minimize sedation as able  Early PT efforts   Acute Hypoxic Respiratory failure: Intubated for airway protection in the setting of intracranial hemorrhage P: PRVC 8 cc/kg as rest mode  Daily PSV wean as tolerated  Mental status barrier for extuabation  Wean O2 for sats >90% Per prior discussion, family would not want trach / PEG.   COPD - without acute exacerbation P: Brovana + Pulmicort  Duoneb Q6  Osteoarthritis P: Continue home prednisone 5mg  QD  Chronic CHF P: Monitor in ICU  Norvasc + labetalol  PRN hydralazine for BP goals  Non AG Metabolic acidosis due to Hyperchloremia - resolved P: Free water 250 ml Q4 Monitor AG  Hyperkalemia P: Trend electrolytes  KCL 8/27   Leukocytosis  Fever - new 8/26 P: Follow cultures, WBC trend  Pulmonary hygiene as able  Continue empiric antibiotics Tylenol PRN for fever > 101.5  Anemia  P: Trend CBC  Monitor for bleeding  Transfuse per ICU guidelines    Best Practice GI: PPI  DVT: SUP  Feeding: TF   Family: Daughters x2 updated at bedside am 8/27 on NP rounds  CC Time: 30 minutes  Noe Gens, NP-C [facility] Pgr: [phone] or if no answer [phone] 04/01/2018, 8:20 AM

## 2018-04-02 ENCOUNTER — Inpatient Hospital Stay (HOSPITAL_COMMUNITY): Payer: Medicare Other

## 2018-04-02 LAB — MAGNESIUM: Magnesium: 1.9 mg/dL (ref 1.7–2.4)

## 2018-04-02 LAB — GLUCOSE, CAPILLARY
GLUCOSE-CAPILLARY: 114 mg/dL — AB (ref 70–99)
Glucose-Capillary: 114 mg/dL — ABNORMAL HIGH (ref 70–99)
Glucose-Capillary: 116 mg/dL — ABNORMAL HIGH (ref 70–99)
Glucose-Capillary: 121 mg/dL — ABNORMAL HIGH (ref 70–99)
Glucose-Capillary: 127 mg/dL — ABNORMAL HIGH (ref 70–99)
Glucose-Capillary: 127 mg/dL — ABNORMAL HIGH (ref 70–99)
Glucose-Capillary: 129 mg/dL — ABNORMAL HIGH (ref 70–99)

## 2018-04-02 LAB — CBC
HEMATOCRIT: 29.7 % — AB (ref 36.0–46.0)
HEMOGLOBIN: 9.1 g/dL — AB (ref 12.0–15.0)
MCH: 26.8 pg (ref 26.0–34.0)
MCHC: 30.6 g/dL (ref 30.0–36.0)
MCV: 87.6 fL (ref 78.0–100.0)
Platelets: 355 10*3/uL (ref 150–400)
RBC: 3.39 MIL/uL — ABNORMAL LOW (ref 3.87–5.11)
RDW: 15.7 % — ABNORMAL HIGH (ref 11.5–15.5)
WBC: 15.3 10*3/uL — ABNORMAL HIGH (ref 4.0–10.5)

## 2018-04-02 LAB — CULTURE, RESPIRATORY

## 2018-04-02 LAB — BASIC METABOLIC PANEL
Anion gap: 9 (ref 5–15)
BUN: 40 mg/dL — ABNORMAL HIGH (ref 8–23)
CHLORIDE: 104 mmol/L (ref 98–111)
CO2: 25 mmol/L (ref 22–32)
CREATININE: 1.12 mg/dL — AB (ref 0.44–1.00)
Calcium: 8.3 mg/dL — ABNORMAL LOW (ref 8.9–10.3)
GFR calc Af Amer: 55 mL/min — ABNORMAL LOW (ref >60)
GFR calc non Af Amer: 47 mL/min — ABNORMAL LOW (ref >60)
Glucose, Bld: 124 mg/dL — ABNORMAL HIGH (ref 70–99)
Potassium: 4.1 mmol/L (ref 3.5–5.1)
Sodium: 138 mmol/L (ref 135–145)

## 2018-04-02 LAB — CULTURE, RESPIRATORY W GRAM STAIN: Culture: NORMAL

## 2018-04-02 MED ORDER — FENTANYL CITRATE (PF) 100 MCG/2ML IJ SOLN
INTRAMUSCULAR | Status: AC
  Filled 2018-04-02: qty 2

## 2018-04-02 MED ORDER — FENTANYL CITRATE (PF) 100 MCG/2ML IJ SOLN
25.0000 ug | Freq: Once | INTRAMUSCULAR | Status: AC
  Administered 2018-04-02: 25 ug via INTRAVENOUS

## 2018-04-02 MED ORDER — MIDAZOLAM HCL 2 MG/2ML IJ SOLN
INTRAMUSCULAR | Status: AC
  Filled 2018-04-02: qty 2

## 2018-04-02 MED ORDER — MIDAZOLAM HCL 2 MG/2ML IJ SOLN
INTRAMUSCULAR | Status: AC
  Administered 2018-04-02: 2 mg
  Filled 2018-04-02: qty 2

## 2018-04-02 NOTE — Progress Notes (Signed)
PULMONARY / CRITICAL CARE MEDICINE   Name: Amber Rogers MRN: [id number] DOB: 03-16-1941    ADMISSION DATE:  03/27/2018 CONSULTATION DATE:  03/27/2018  REFERRING MD:  Dr. Leonel Ramsay  CHIEF COMPLAINT:  ICH  BRIEF SUMMARY:  77 year old female with PMH of HTN, CKD, CHF (EF 55% and grade 2 DD in 2016), and COPD. She presented to [facility] ED 8/22 with complaints of headache, nausea, and vomiting with onset of symptoms around 1200. She was found to be hypertensive. CT scan demonstrated hematoma in the left cerebellum with early hydrocephalus. Initially no neurosurgical intervention was indicated due to her reasonable mental status and dual antiplatelet regimen, however, her condition continued to decline and she required intubation. She was then deemed a candidate for EVD placement. PCCM consulted for medical/vent/BP management.  SUBJECTIVE:  RN reports pt received versed overnight for a bath.  Tmax 100.1.  Concern EVD clotted off.  RN reports mental status less alert than evening prior. ETT leak / pt bit tube   VITAL SIGNS: BP (!) 152/56   Pulse 64   Temp 100.1 F (37.8 C) (Axillary)   Resp (!) 21   Ht 5\' 6"  (1.676 m)   Wt 70.4 kg   SpO2 96%   BMI 25.05 kg/m   HEMODYNAMICS:    VENTILATOR SETTINGS: Vent Mode: CPAP;PSV FiO2 (%):  [30 %] 30 % Set Rate:  [15 bmp] 15 bmp Vt Set:  [490 mL] 490 mL PEEP:  [5 cmH20] 5 cmH20 Pressure Support:  [8 cmH20] 8 cmH20 Plateau Pressure:  [10 cmH20-15 cmH20] 11 cmH20  INTAKE / OUTPUT: I/O last 3 completed shifts: In: 5517.2 [I.V.:1717.2; Other:200; NG/GT:3300; IV Piggyback:300] Out: 2220 [Urine:2080; Drains:140]  PHYSICAL EXAMINATION: General: critically ill appearing elderly female  HEENT: MM pink/moist, ETT Neuro: pupils 25mm bilaterally, reactive, gives thumbs up, moves all ext's, intermittently follows commands CV: s1s2 rrr, no m/r/g PULM: even/non-labored, lungs bilaterally clear anterior, diminished bases BZ:JIRC, non-tender, bsx4  active  Extremities: warm/dry, 1+ edema in hands, none in LE's  Skin: no rashes or lesions, bruising on upper extremities   LABS:  BMET Recent Labs  Lab 03/31/18 0333 04/01/18 0404 04/02/18 0335  NA 138 138 138  K 3.7 3.0* 4.1  CL 102 102 104  CO2 25 26 25   BUN 39* 42* 40*  CREATININE 1.57* 1.25* 1.12*  GLUCOSE 144* 115* 124*   Electrolytes Recent Labs  Lab 03/29/18 1708 03/30/18 0235 03/31/18 0333 04/01/18 0404 04/02/18 0335  CALCIUM  --  8.3* 8.3* 8.2* 8.3*  MG 2.2 2.3 2.0  --  1.9  PHOS 3.6 3.6 4.5  --   --    CBC Recent Labs  Lab 03/31/18 0333 04/01/18 0404 04/02/18 0335  WBC 24.8* 19.2* 15.3*  HGB 10.5* 9.1* 9.1*  HCT 34.1* 29.3* 29.7*  PLT 359 316 355   Coag's Recent Labs  Lab 03/27/18 2253  INR 1.06   Sepsis Markers No results for input(s): LATICACIDVEN, PROCALCITON, O2SATVEN in the last 168 hours.  ABG Recent Labs  Lab 03/28/18 0442 03/30/18 0331 03/31/18 0325  PHART 7.379 7.270* 7.395  PCO2ART 39.7 49.4* 38.9  PO2ART 109.0* 85.0 85.0    Liver Enzymes Recent Labs  Lab 03/27/18 1403  AST 25  ALT 16  ALKPHOS 82  BILITOT 1.2  ALBUMIN 3.4*    Cardiac Enzymes Recent Labs  Lab 03/27/18 1403  TROPONINI <0.03    Glucose Recent Labs  Lab 04/01/18 1251 04/01/18 1552 04/01/18 1953 04/02/18 0005 04/02/18 0413  04/02/18 0822  GLUCAP 181* 115* 129* 114* 114* 116*    Imaging No results found.   STUDIES:  CT head 8/22 > 2 cm hematoma in the deep left cerebellum with fourth ventricular extension extending into the third/lateral ventricles and into the dorsal cervical canal. There is mild hydrocephalus at the lateral ventricles when compared to 2016. A deep cerebellar origin favors hypertensive etiology. CT angio head 8/22 > No vascular malformation or abnormal enhancement of the brain identified. Stable cerebellar and intraventricular hemorrhage. Patent anterior and posterior intracranial circulation. No large vessel occlusion,  aneurysm, or significant stenosis. Calcific atherosclerosis of the carotid and vertebral arteries with mild stenosis. CT Head w/o 8/27 >  Partial interval dispersion of intraventricular, subarachnoid and left cerebellar brain parenchymal hemorrhage.  Resolution of hydrocephalus.  Stable position of right frontal approach ventric catheter, no new acute process.   CULTURES: BCx2 8/26 >>  Tracheal Aspirate 8/26 >>   ANTIBIOTICS: Vanco 8/26 >>  Cefepime 8/26 >>   SIGNIFICANT EVENTS: 8/22 Admit, EVD placed.  8/26 Fever, empiric abx added   LINES/TUBES: ETT 8/22 > EVD 8/22 >  ASSESSMENT / PLAN:  Intracranial hemorrhage - 2cm L cerebellar hematoma with intraventricular extension. Secondary to hypertensive crisis. Initial pressures as high as 209/158.  Obstructive Hydrocephalus s/p EVD (8/22) P: SBP goal <160  Follow neuro exam  May need repeat neuro imaging 8/28 pending exam with concern for drain occlusion Continue provigil during daytime, nighttime rest  Management per Neurology / NSGY  Hold home antiplatelets  Discontinue versed  PT efforts as able  Acute Hypoxic Respiratory failure: Intubated for airway protection in the setting of intracranial hemorrhage Hx Tobacco Abuse  P: PRVC 8cc/kg Wean PEEP / FiO2 for sats > 90% PSV as tolerated  If not able to extubate in next 24 hours, will need to exchange ETT due to leak  Family reconsidering decision regarding trach given her neuro improvement in the last 24 hours   COPD - without acute exacerbation P: Duoneb Q6  Continue brovana + pulmicort  Osteoarthritis P: Prednisone 5mg  QD / home med   Chronic CHF P: ICU monitoring  Continue norvasc, labetalol  PRN hydralazine for BP goal  Non AG Metabolic acidosis due to Hyperchloremia - resolved Hyperkalemia P: Trend BMP / urinary output Replace electrolytes as indicated Avoid nephrotoxic agents, ensure adequate renal perfusion Free water 250 ml Q4   Leukocytosis   Fever - new 8/26 P: Empiric abx as above  Follow cultures  PRN tylenol for fever > 101.5.  Doubt infectious process, likely neurological fever   Anemia  P: Trend CBC Transfuse per ICU guidelines   Best Practice GI: PPI  DVT: SUP  Feeding: TF   Family: Family updated at bedside 8/28.    CC Time: 30 minutes   Noe Gens, NP-C [facility] Pgr: [phone] or if no answer [phone] 04/02/2018, 8:40 AM

## 2018-04-02 NOTE — Progress Notes (Signed)
Patient taken to CT and returned to 4N30. Patient tolerated well. No complications. Vitals stable. RT will continue to monitor.

## 2018-04-02 NOTE — Progress Notes (Signed)
STROKE TEAM PROGRESS NOTE   INTERVAL HISTORY Her daughters are at the bedside.  Patient remained intubated off sedation. No significant neuro change this am comparing with yesterday am. However, as per RN, pt was awake alert and following commands yesterday afternoon 3pm to 8pm at least. However, she received versed at 2:25am and her EVD was clogged this am. NSG on board, will likely to repeat CT at lunch time to decide on further management.    Vitals:   04/02/18 0700 04/02/18 0800 04/02/18 0850 04/02/18 1117  BP: (!) 156/60 (!) 152/56  (!) 145/61  Pulse: 64 65    Resp: (!) 21 (!) 21  (!) 25  Temp:   100.3 F (37.9 C)   TempSrc:   Axillary   SpO2: 96% 96%    Weight:      Height:        CBC:  Recent Labs  Lab 03/27/18 1403  04/01/18 0404 04/02/18 0335  WBC 14.9*   < > 19.2* 15.3*  NEUTROABS 12.2*  --   --   --   HGB 11.6*   < > 9.1* 9.1*  HCT 37.5   < > 29.3* 29.7*  MCV 86.4   < > 87.7 87.6  PLT 308   < > 316 355   < > = values in this interval not displayed.    Basic Metabolic Panel:  Recent Labs  Lab 03/30/18 0235  03/31/18 0333 04/01/18 0404 04/02/18 0335  NA 140  --  138 138 138  K 5.3*   < > 3.7 3.0* 4.1  CL 110  --  102 102 104  CO2 21*  --  25 26 25   GLUCOSE 112*  --  144* 115* 124*  BUN 33*  --  39* 42* 40*  CREATININE 1.43*  --  1.57* 1.25* 1.12*  CALCIUM 8.3*  --  8.3* 8.2* 8.3*  MG 2.3  --  2.0  --  1.9  PHOS 3.6  --  4.5  --   --    < > = values in this interval not displayed.   Lipid Panel:     Component Value Date/Time   CHOL 177 03/30/2018 0235   TRIG 67 03/30/2018 2248   HDL 101 03/30/2018 0235   CHOLHDL 1.8 03/30/2018 0235   VLDL 10 03/30/2018 0235   LDLCALC 66 03/30/2018 0235   HgbA1c:  Lab Results  Component Value Date   HGBA1C 5.5 03/30/2018   Urine Drug Screen:     Component Value Date/Time   LABOPIA NONE DETECTED 03/29/2018 1110   COCAINSCRNUR NONE DETECTED 03/29/2018 1110   LABBENZ NONE DETECTED 03/29/2018 1110   AMPHETMU  NONE DETECTED 03/29/2018 1110   THCU NONE DETECTED 03/29/2018 1110   LABBARB NONE DETECTED 03/29/2018 1110    Alcohol Level     Component Value Date/Time   ETH <10 03/27/2018 1354    IMAGING  Ct Angio Head W Or Wo Contrast 03/27/2018 IMPRESSION:  1. No vascular malformation or abnormal enhancement of the brain identified.  2. Stable cerebellar and intraventricular hemorrhage.  3. Patent anterior and posterior intracranial circulation. No large vessel occlusion, aneurysm, or significant stenosis.  4. Calcific atherosclerosis of the carotid and vertebral arteries with mild stenosis.    Ct Head Wo Contrast 03/28/2018 IMPRESSION:  1. Stable volume of intracranial hemorrhage within the left cerebellar hemisphere and the ventricular system. Some redistribution of blood products to the lateral ventricle atria and sylvian fissures.  2. No new acute  intracranial abnormality.  3. Interval right frontal approach ventriculostomy catheter placement. Mild decrease in hydrocephalus.    Ct Head Wo Contrast 03/27/2018 IMPRESSION:  2 cm hematoma in the deep left cerebellum with fourth ventricular extension extending into the third/lateral ventricles and into the dorsal cervical canal. There is mild hydrocephalus at the lateral ventricles when compared to 2016. A deep cerebellar origin favors hypertensive etiology.   CUS 1-39% ICA stenosis.  Vertebral artery flow is antegrade. Significant calcific plaque bilateral bifurcations, however, velocities are not significantly increased.   TTE - Left ventricle: The cavity size was normal. Wall thickness was   increased in a pattern of mild LVH. Systolic function was normal.   The estimated ejection fraction was in the range of 60% to 65%.   Doppler parameters are consistent with both elevated ventricular   end-diastolic filling pressure and elevated left atrial filling   pressure. - Aortic valve: There was mild stenosis. Valve area (VTI): 1.88   cm^2.  Valve area (Vmax): 1.82 cm^2. Valve area (Vmean): 1.59   cm^2. - Mitral valve: Calcified annulus. - Atrial septum: No defect or patent foramen ovale was identified.  Ct Head Wo Contrast  Result Date: 03/30/2018 CLINICAL DATA:  77 y/o F; follow-up for intracranial hemorrhage, intraventricular hemorrhage, and hydrocephalus. EXAM: CT HEAD WITHOUT CONTRAST TECHNIQUE: Contiguous axial images were obtained from the base of the skull through the vertex without intravenous contrast. COMPARISON:  03/28/2018 and 03/27/2018 CT head. FINDINGS: Brain: Stable volume acute hemorrhage within the left medial cerebellar hemisphere, ventricular system, and subarachnoid spaces of the sylvian fissures. There is some interval redistribution, for example decreased hemorrhage within the frontal horns of lateral ventricles, and increased pooling in the occipital horns. No new acute intracranial hemorrhage, stroke, or mass effect. Right frontal approach ventriculostomy catheter is stable in position traversing the frontal horn of right lateral ventricle. Stable ventricle size. Vascular: Calcific atherosclerosis of carotid siphons and vertebral arteries. Skull: Postsurgical changes related to a right frontal approach ventriculostomy catheter with decreased edema in the scalp. Sinuses/Orbits: Intubated patient. Maxillary sinus mucous retention cysts. Normal aeration of mastoid air cells. Orbits are unremarkable. Other: None. IMPRESSION: Stable volume of intraventricular, subarachnoid, and left cerebellar hemorrhage. Stable ventricle size. No new acute intracranial abnormality. Electronically Signed   By: Kristine Garbe M.D.   On: 03/30/2018 05:12    Ct Head Wo Contrast  Result Date: 04/01/2018 CLINICAL DATA:  77 y/o  F; follow-up of intracranial hemorrhage. EXAM: CT HEAD WITHOUT CONTRAST TECHNIQUE: Contiguous axial images were obtained from the base of the skull through the vertex without intravenous contrast. COMPARISON:   03/30/2018 CT head.  09/24/2014 CT head. FINDINGS: Brain: Partial interval dispersion of intraventricular, subarachnoid, and left cerebellar brain parenchymal hemorrhage. No new acute intracranial hemorrhage, stroke, or focal mass effect. Decreased size of the lateral and third ventricles, ventricle size now similar to 2016 CT head. Stable position of right frontal approach ventriculostomy catheter with tip in the right lateral ventricle near the foramen of Monro. Vascular: Calcific atherosclerosis of carotid siphons and vertebral arteries. No hyperdense vessel identified. Skull: Right frontal burr hole postsurgical changes. Torus palatini and maxillaris. Sinuses/Orbits: Endotracheal and enteric tubes. Left maxillary sinus small mucous retention cyst. Visualized paranasal sinuses and mastoid air cells are otherwise normally aerated. Orbits are unremarkable. Other: None. IMPRESSION: 1. Partial interval dispersion of intraventricular, subarachnoid, and left cerebellar brain parenchymal hemorrhage. 2. Resolution of hydrocephalus. Stable position of right frontal approach ventriculostomy catheter. 3. No new acute intracranial process. Electronically  Signed   By: Kristine Garbe M.D.   On: 04/01/2018 05:26   Dg Chest Port 1 View  Result Date: 04/01/2018 CLINICAL DATA:  Ventilator support EXAM: PORTABLE CHEST 1 VIEW COMPARISON:  03/31/2018 FINDINGS: Endotracheal tube tip is 2.5 cm above the carina. Nasogastric tube enters the stomach. Atelectasis in both lower lobes appears similar. Upper lungs remain clear. IMPRESSION: Endotracheal tube and nasogastric tube unchanged in well-positioned. Persistent atelectasis in both lower lobes. Electronically Signed   By: Nelson Chimes M.D.   On: 04/01/2018 09:08     PHYSICAL EXAM  elderly lady who is intubated and sedated. She has a right frontal ventriculostomy catheter. . Afebrile. Head is nontraumatic. Neck is supple without bruit.    Cardiac exam no murmur or  gallop. Lungs are clear to auscultation. Distal pulses are well felt. Neurological Exam :  Intubated, eyes closed, off sedation. Not open eyes on pain stimulation but grimace, does not follow commands. Pupils are small 2 mm pinpoint and reactive to light. Eyes right gazed preference, able to roll to the right but briefly. Blinking to visual threat on the right but not on the left. Corneal reflexes are brinsk. She is on CPAP and tolerating well. She has a cough and gag. Motor system exam she is able to withdraw all 4 extremities with painful stimuli but does not follow commands, able to against gravity RUE and localizing to pain with RUE. However, LUE 1/5 and BLEs 2/5 on pain stimulation. Deep tendon reflexes are 1+ symmetric, but babinski positive bilaterally. Sensation, coordination and gait not tested.    ASSESSMENT/PLAN Ms. Amber Rogers is a 77 y.o. female with history of COPD, CHF, HTN, HLD, CKD III, CAD s/p stent presenting with sudden onset nausea, vomiting and dizziness.   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  Mild hydrocephalus lateral ventricles.    CTA head no vascular malformation or abnormal enhancement.  Stable cerebellar and IVH.  No LVO.  Intracranial atherosclerosis.  Repeat CT x 3 - Stable volume of intracranial hemorrhage and hydrocephalus  CUS - Significant calcific plaque bilateral bifurcations, however, velocities are not significantly increased.  2D Echo EF 60-65%  UDS neg  LDL 66  HgbA1c 5.5  Heparin subq for VTE prophylaxis  NPO on TF @ 50cc/h  aspirin 81 mg daily and Brilinta 90 mg twice daily prior to admission, now on no  antithrombotics  Therapy recommendations:  Pending  Disposition:  pending   Encephalopathy   Depressed mental status even off sedation, however, following commands on 04/01/18 pm  Could be due to brainstem irritation with 4th IVH vs. Sleep wake cycle disturbance  AKI on CKD - Cre 1.36-1.33-1.35-1.43->1.57->1.25->1.12  Leukocytosis - WBC 14.0->16.2->24.8->19.2->15.3  fever Tmax 101.1->102.4->100.5->afebrile  CXR - bilateral basilar atelectasis  UA WBC 6-10  Discussed with NSG Dr. Kathyrn Sheriff - not concerning for ventriculitis as this time  On vanco/cefepime for empirical antibiotics.   Hyperkalemia   K 3.6->5.3->3.7->3.0->4.1  Supplement  Magnesium 1.9  BMP monitoring daily   Carotid stenosis   CUS 08/2017 R ICA stenosis 50-69%, L ICA < 50%  CUS repeat showed b/l ICA 1-39% stenosis but significant calcified plaque at carotid bifurcation bilaterally.   Obstructive hydrocephalus s/p R frontal EVD  EVD placed 8/22, not patent on 8/28  Clogging vs. Ventricle collapse - plan for CT at lunch time  NSG on board  Repeat CT head showed resolution of hydrocephalus  Discussed with NSG Dr. Kathyrn Sheriff - not concerning for ventriculitis as this time  Acute Hypoxic Respiratory failure  Secondary to stroke  Intubated in the ED with neuro decline   off propofol and Fentanyl   Still depressed mental status  CCM on board  Tolerating weaning trials well  Hypertensive Emergency  BP stable off cardene  Home Meds: Norvasc 10, resumed  increase labetalol to 200mg  tid  SBP goal < 160  Hyperlipidemia  Home meds:  ?? Lipitor 80   LDL 66  Consider to resume lipitor on discharge  Other Stroke Risk Factors  Advanced age  Former Cigarette smoker, quit 2 years ago  Coronary artery disease s/p stent - on ASA and brilinta PTA  Chronic Congestive heart failure  Other Active Problems  Hx vertigo   COPD - on home prednisone per tube  Leukocytosis - 14.9  ->14.0->14.3->16.2->24.8->19.2->15.3  CKD stage III - Creatinine - 1.35->1.43->1.57->1.25->1.12  Hospital day # 6  This patient is critically ill due to cerebellar ICH with IVH and hydrocephalus, respiratory failure, hypertensive emergency and at significant risk of neurological worsening, death form recurrent bleeding, hydrocephalus, increased ICP, cerebral edema and brain death, seizure. This patient's care requires constant monitoring of vital signs, hemodynamics, respiratory and cardiac monitoring, review of multiple databases, neurological assessment, discussion with family, other specialists and medical decision making of high complexity. I spent 35 minutes of neurocritical care time in the care of this patient. I had long discussion with daughters at bedside, updated pt current condition, treatment plan and potential prognosis. They expressed understanding and appreciation. I also discussed with Dr. Kathyrn Sheriff in the hallway.   Rosalin Hawking, MD PhD Stroke Neurology 04/02/2018 11:57 AM   To contact Stroke Continuity provider, please refer to [email or web address] After hours, contact General Neurology

## 2018-04-02 NOTE — Progress Notes (Signed)
ETT exchanged over tube exchanger per Dr. Lynetta Mare.  Color change on end-tidal CO2 detector, breath sounds auscultated, condensation noted in ETT.  CXR ordered.  Family updated on status.     Noe Gens, NP-C [facility] Pgr: [phone] or if no answer [phone] 04/02/2018, 3:53 PM

## 2018-04-02 NOTE — Progress Notes (Signed)
  NEUROSURGERY PROGRESS NOTE   Drain became non-functional last night. Per RN, was briskly following commands at shift change yesterday evening.  EXAM:  BP (!) 152/56   Pulse 64   Temp 100.1 F (37.8 C) (Axillary)   Resp (!) 21   Ht 5\' 6"  (1.676 m)   Wt 70.4 kg   SpO2 96%   BMI 25.05 kg/m   No spontaneous eye opening ?tracking Breathes over vent W/d to noxious stim No drainge from EVD, non-fluctuating meniscus  IMAGING: CT yesterday demonstrates decrease in ventricular size, decreased amount of pos fossa/IV hemorrhage.  IMPRESSION:  77 y.o. female with pos fossa hemorrhage EVD non-functional - clogged, ?ventricular collapse  PLAN: - will cont to monitor neurologic exam. If she begins to follow commands again will keep EVD and monitor exam. If not, will repeat CT to determine if she is developing HCP and replace drain if necessary.

## 2018-04-02 NOTE — Progress Notes (Signed)
Patient's ETT was exchanged due to patient biting hole in tube. 7.5 ETT was replaced without complication using tube exchanger. Vital signs stable throughout. Patient tolerated well. RT will continue to monitor.

## 2018-04-03 ENCOUNTER — Inpatient Hospital Stay (HOSPITAL_COMMUNITY): Payer: Medicare Other

## 2018-04-03 DIAGNOSIS — R509 Fever, unspecified: Secondary | ICD-10-CM

## 2018-04-03 DIAGNOSIS — J9600 Acute respiratory failure, unspecified whether with hypoxia or hypercapnia: Secondary | ICD-10-CM

## 2018-04-03 DIAGNOSIS — R7989 Other specified abnormal findings of blood chemistry: Secondary | ICD-10-CM

## 2018-04-03 LAB — CSF CELL COUNT WITH DIFFERENTIAL
Eosinophils, CSF: 0 % (ref 0–1)
Eosinophils, CSF: 0 % (ref 0–1)
Lymphs, CSF: 10 % — ABNORMAL LOW (ref 40–80)
Lymphs, CSF: 15 % — ABNORMAL LOW (ref 40–80)
Monocyte-Macrophage-Spinal Fluid: 13 % — ABNORMAL LOW (ref 15–45)
Monocyte-Macrophage-Spinal Fluid: 19 % (ref 15–45)
RBC Count, CSF: 8000 /mm3 — ABNORMAL HIGH
RBC Count, CSF: 8000 /mm3 — ABNORMAL HIGH
Segmented Neutrophils-CSF: 77 % — ABNORMAL HIGH (ref 0–6)
Segmented Neutrophils-CSF: 65 % — ABNORMAL HIGH (ref 0–6)
Tube #: 1
Tube #: 3
WBC, CSF: 46 /mm3 (ref 0–5)
WBC, CSF: 46 /mm3 (ref 0–5)

## 2018-04-03 LAB — CBC
HCT: 28.9 % — ABNORMAL LOW (ref 36.0–46.0)
HEMOGLOBIN: 8.7 g/dL — AB (ref 12.0–15.0)
MCH: 26.8 pg (ref 26.0–34.0)
MCHC: 30.1 g/dL (ref 30.0–36.0)
MCV: 88.9 fL (ref 78.0–100.0)
Platelets: 396 10*3/uL (ref 150–400)
RBC: 3.25 MIL/uL — ABNORMAL LOW (ref 3.87–5.11)
RDW: 15.9 % — AB (ref 11.5–15.5)
WBC: 12.4 10*3/uL — AB (ref 4.0–10.5)

## 2018-04-03 LAB — GLUCOSE, CAPILLARY
GLUCOSE-CAPILLARY: 119 mg/dL — AB (ref 70–99)
GLUCOSE-CAPILLARY: 117 mg/dL — AB (ref 70–99)
Glucose-Capillary: 121 mg/dL — ABNORMAL HIGH (ref 70–99)
Glucose-Capillary: 134 mg/dL — ABNORMAL HIGH (ref 70–99)
Glucose-Capillary: 138 mg/dL — ABNORMAL HIGH (ref 70–99)
Glucose-Capillary: 146 mg/dL — ABNORMAL HIGH (ref 70–99)

## 2018-04-03 LAB — POCT I-STAT 3, ART BLOOD GAS (G3+)
ACID-BASE EXCESS: 1 mmol/L (ref 0.0–2.0)
BICARBONATE: 26.7 mmol/L (ref 20.0–28.0)
O2 Saturation: 100 %
Patient temperature: 98.4
TCO2: 28 mmol/L (ref 22–32)
pCO2 arterial: 45.5 mmHg (ref 32.0–48.0)
pH, Arterial: 7.376 (ref 7.350–7.450)
pO2, Arterial: 300 mmHg — ABNORMAL HIGH (ref 83.0–108.0)

## 2018-04-03 LAB — BASIC METABOLIC PANEL
Anion gap: 9 (ref 5–15)
BUN: 39 mg/dL — ABNORMAL HIGH (ref 8–23)
CALCIUM: 8.4 mg/dL — AB (ref 8.9–10.3)
CHLORIDE: 107 mmol/L (ref 98–111)
CO2: 24 mmol/L (ref 22–32)
CREATININE: 1.11 mg/dL — AB (ref 0.44–1.00)
GFR calc non Af Amer: 48 mL/min — ABNORMAL LOW (ref >60)
GFR, EST AFRICAN AMERICAN: 55 mL/min — AB (ref >60)
Glucose, Bld: 125 mg/dL — ABNORMAL HIGH (ref 70–99)
Potassium: 4.2 mmol/L (ref 3.5–5.1)
Sodium: 140 mmol/L (ref 135–145)

## 2018-04-03 LAB — PROTEIN AND GLUCOSE, CSF
Glucose, CSF: 73 mg/dL — ABNORMAL HIGH (ref 40–70)
Total  Protein, CSF: 84 mg/dL — ABNORMAL HIGH (ref 15–45)

## 2018-04-03 MED ORDER — LACTATED RINGERS IV BOLUS
1000.0000 mL | Freq: Once | INTRAVENOUS | Status: AC
  Administered 2018-04-03: 1000 mL via INTRAVENOUS

## 2018-04-03 MED ORDER — FENTANYL CITRATE (PF) 100 MCG/2ML IJ SOLN
100.0000 ug | Freq: Once | INTRAMUSCULAR | Status: AC
  Administered 2018-04-03: 100 ug via INTRAVENOUS

## 2018-04-03 MED ORDER — ETOMIDATE 2 MG/ML IV SOLN
20.0000 mg | Freq: Once | INTRAVENOUS | Status: AC
  Administered 2018-04-03: 20 mg via INTRAVENOUS

## 2018-04-03 MED ORDER — ROCURONIUM BROMIDE 50 MG/5ML IV SOLN
70.0000 mg | Freq: Once | INTRAVENOUS | Status: AC
  Administered 2018-04-03: 70 mg via INTRAVENOUS
  Filled 2018-04-03: qty 7

## 2018-04-03 MED ORDER — DEXAMETHASONE SODIUM PHOSPHATE 10 MG/ML IJ SOLN
4.0000 mg | Freq: Four times a day (QID) | INTRAMUSCULAR | Status: DC
  Administered 2018-04-03 – 2018-04-04 (×3): 4 mg via INTRAVENOUS
  Filled 2018-04-03 (×2): qty 1

## 2018-04-03 MED ORDER — MIDAZOLAM HCL 2 MG/2ML IJ SOLN
INTRAMUSCULAR | Status: AC
  Administered 2018-04-03: 2 mg via INTRAVENOUS
  Filled 2018-04-03: qty 2

## 2018-04-03 MED ORDER — RACEPINEPHRINE HCL 2.25 % IN NEBU
INHALATION_SOLUTION | RESPIRATORY_TRACT | Status: AC
  Filled 2018-04-03: qty 0.5

## 2018-04-03 MED ORDER — DEXAMETHASONE SODIUM PHOSPHATE 10 MG/ML IJ SOLN
INTRAMUSCULAR | Status: AC
  Administered 2018-04-03: 4 mg via INTRAVENOUS
  Filled 2018-04-03: qty 1

## 2018-04-03 MED ORDER — FREE WATER
250.0000 mL | Freq: Three times a day (TID) | Status: DC
  Administered 2018-04-03 – 2018-04-11 (×22): 250 mL

## 2018-04-03 MED ORDER — SODIUM BICARBONATE 8.4 % IV SOLN
50.0000 meq | Freq: Once | INTRAVENOUS | Status: AC
  Administered 2018-04-03: 50 meq via INTRAVENOUS

## 2018-04-03 MED ORDER — MIDAZOLAM HCL 2 MG/2ML IJ SOLN
2.0000 mg | Freq: Once | INTRAMUSCULAR | Status: AC
  Administered 2018-04-03: 2 mg via INTRAVENOUS

## 2018-04-03 MED ORDER — VITAL AF 1.2 CAL PO LIQD
1000.0000 mL | ORAL | Status: DC
  Administered 2018-04-03 – 2018-04-09 (×6): 1000 mL
  Filled 2018-04-03 (×3): qty 1000

## 2018-04-03 MED ORDER — SODIUM BICARBONATE 8.4 % IV SOLN
INTRAVENOUS | Status: AC
  Administered 2018-04-03: 50 meq via INTRAVENOUS
  Filled 2018-04-03: qty 100

## 2018-04-03 MED ORDER — RACEPINEPHRINE HCL 2.25 % IN NEBU
0.5000 mL | INHALATION_SOLUTION | Freq: Once | RESPIRATORY_TRACT | Status: AC
  Administered 2018-04-03: 0.5 mL via RESPIRATORY_TRACT

## 2018-04-03 NOTE — Progress Notes (Signed)
Nutrition Follow-up  INTERVENTION:   Change tube feeding: - Vital AF 1.2 @ 50 ml/hr (1200 ml/day)  Tube feeding regimen provides 1440 kcal, 90 grams of protein, and 972 ml of H2O.   NUTRITION DIAGNOSIS:   Inadequate oral intake related to acute illness as evidenced by NPO status.  Ongoing, being addressed via TF  GOAL:   Patient will meet greater than or equal to 90% of their needs  Met via TF  MONITOR:   Vent status, Labs, Weight trends, TF tolerance  REASON FOR ASSESSMENT:   Ventilator    ASSESSMENT:   77 yo female admitted with deep left cerebellar hemorrhage with 3rd and 4th ventricle extension and mild hydrocephalus. Pt required intubation for airway protection. Pt with hx of HTN, CKD  8/22 - R frontal ventriculostomy 8/27 - EVD became non-functional in the evening 8/28 - ETT replaced, EVD replaced  Discussed pt with RN and during ICU rounds.  Pt's weight has increased since admission likely related to fluid status. Will continue to use pt's admission weight to estimate needs.  Patient is currently intubated on ventilator support. MV: 8.2 L/min Temp (24hrs), Avg:100 F (37.8 C), Min:99 F (37.2 C), Max:102 F (38.9 C) BP: 129/54 MAP: 77  Propofol: none IVF: NaCl @ 50 ml/hr  Current tube feeding: Vital High Protein @ 50 ml/hr, 250 ml free water q 4 hours  Medications reviewed and include: sliding scale Novolog q 4 hours, 40 mg Protonix daily  Labs reviewed: BUN 39 (H), creatinine 1.11 (H), hemoglobin 8.7 (L), HCT 28.9 (L) CBG's: 121, 119, 121, 127, 127, 129 x 24 hours  UOP: 1925 ml x 24 hours (1.1 ml/kg/hr) EVD: 165 ml output x 24 hours I/O's: +9.1 L since admission  Diet Order:   Diet Order            Diet NPO time specified  Diet effective now              EDUCATION NEEDS:   No education needs have been identified at this time  Skin:  Skin Assessment: Reviewed RN Assessment  Last BM:  03/31/18  Height:   Ht Readings from Last 1  Encounters:  03/27/18 5' 6"  (1.676 m)    Weight:   Wt Readings from Last 1 Encounters:  04/03/18 72.6 kg    Ideal Body Weight:  59.1 kg  BMI:  Body mass index is 25.83 kg/m.  Estimated Nutritional Needs:   Kcal:  1507  Protein:  80-119 g  Fluid:  >/= 1.6 L    Gaynell Face, MS, RD, LDN Pager: [phone] Weekend/After Hours: [phone]

## 2018-04-03 NOTE — Progress Notes (Signed)
PULMONARY / CRITICAL CARE MEDICINE   Name: Amber Rogers MRN: [id number] DOB: Apr 11, 1941    ADMISSION DATE:  03/27/2018 CONSULTATION DATE:  03/27/2018  REFERRING MD:  Dr. Leonel Ramsay  CHIEF COMPLAINT:  ICH  BRIEF SUMMARY:  77 year old female with PMH of HTN, CKD, CHF (EF 55% and grade 2 DD in 2016), and COPD. She presented to [facility] ED 8/22 with complaints of headache, nausea, and vomiting with onset of symptoms around 1200. She was found to be hypertensive. CT scan demonstrated hematoma in the left cerebellum with early hydrocephalus. Initially no neurosurgical intervention was indicated due to her reasonable mental status and dual antiplatelet regimen, however, her condition continued to decline and she required intubation. She was then deemed a candidate for EVD placement. PCCM consulted for medical/vent/BP management.  SUBJECTIVE:  RN reports fever to 100.7 (core temp).  WBC down to 12.4.  No acute events overnight.    VITAL SIGNS: BP (!) 157/62 (BP Location: Right Arm)   Pulse 68   Temp 99.2 F (37.3 C) (Axillary)   Resp (!) 23   Ht 5\' 6"  (1.676 m)   Wt 72.6 kg   SpO2 95%   BMI 25.83 kg/m   HEMODYNAMICS:    VENTILATOR SETTINGS: Vent Mode: CPAP;PSV FiO2 (%):  [30 %] 30 % Set Rate:  [15 bmp] 15 bmp Vt Set:  [490 mL] 490 mL PEEP:  [5 cmH20] 5 cmH20 Pressure Support:  [8 cmH20] 8 cmH20 Plateau Pressure:  [15 cmH20-19 cmH20] 19 cmH20  INTAKE / OUTPUT: I/O last 3 completed shifts: In: 5758.2 [I.V.:1646.2; NG/GT:3850; IV Piggyback:262] Out: 3085 [Urine:2900; Drains:185]  PHYSICAL EXAMINATION: General: elderly female in NAD on vent  HEENT: MM pink/moist, ETT, R IVC drain in place  Neuro: eyes closed, pupils 83mm, grimaces to pain, moves all extremities but does not follow commands CV: s1s2 rrr, no m/r/g PULM: even/non-labored, lungs bilaterally clear, diminished bases  KZ:LDJT, non-tender, bsx4 active  Extremities: warm/dry, 1+ edema in hands, none in LE's  Skin: no  rashes or lesions  LABS:  BMET Recent Labs  Lab 04/01/18 0404 04/02/18 0335 04/03/18 0259  NA 138 138 140  K 3.0* 4.1 4.2  CL 102 104 107  CO2 26 25 24   BUN 42* 40* 39*  CREATININE 1.25* 1.12* 1.11*  GLUCOSE 115* 124* 125*   Electrolytes Recent Labs  Lab 03/29/18 1708 03/30/18 0235 03/31/18 0333 04/01/18 0404 04/02/18 0335 04/03/18 0259  CALCIUM  --  8.3* 8.3* 8.2* 8.3* 8.4*  MG 2.2 2.3 2.0  --  1.9  --   PHOS 3.6 3.6 4.5  --   --   --    CBC Recent Labs  Lab 04/01/18 0404 04/02/18 0335 04/03/18 0259  WBC 19.2* 15.3* 12.4*  HGB 9.1* 9.1* 8.7*  HCT 29.3* 29.7* 28.9*  PLT 316 355 396   Coag's Recent Labs  Lab 03/27/18 2253  INR 1.06   Sepsis Markers No results for input(s): LATICACIDVEN, PROCALCITON, O2SATVEN in the last 168 hours.  ABG Recent Labs  Lab 03/28/18 0442 03/30/18 0331 03/31/18 0325  PHART 7.379 7.270* 7.395  PCO2ART 39.7 49.4* 38.9  PO2ART 109.0* 85.0 85.0    Liver Enzymes Recent Labs  Lab 03/27/18 1403  AST 25  ALT 16  ALKPHOS 82  BILITOT 1.2  ALBUMIN 3.4*    Cardiac Enzymes Recent Labs  Lab 03/27/18 1403  TROPONINI <0.03    Glucose Recent Labs  Lab 04/02/18 0822 04/02/18 1124 04/02/18 1757 04/02/18 1948 04/02/18  2343 04/03/18 0358  GLUCAP 116* 129* 127* 127* 121* 119*    Imaging Ct Head Wo Contrast  Result Date: 04/02/2018 CLINICAL DATA:  Postop unresponsiveness. EXAM: CT HEAD WITHOUT CONTRAST TECHNIQUE: Contiguous axial images were obtained from the base of the skull through the vertex without intravenous contrast. COMPARISON:  Earlier today FINDINGS: Brain: Recurrent lateral and third ventricular hydrocephalus with ballooning of the horns. Clot is newly seen along the EVD catheter, which is presumably dysfunctional. There is also increased edema around the catheter which is likely tracking CSF. No rebleeding is seen. There is stable left cerebellar, intraventricular, and subarachnoid clot. Negative for  infarct. Vascular: Negative Skull: Unremarkable burr hole for EVD.  Right parietal osteoma. Sinuses/Orbits: Negative Other: During reading, case discussed with RN Roselyn Reef and findings were expected. She is currently calling Dr Kathyrn Sheriff for orders. IMPRESSION: 1. Recurrent lateral and third ventricular hydrocephalus. Newly seen clot along the EVD, presumed cause of catheter dysfunction. 2. No interval bleeding. 3. Increased low-density along the EVD, likely tracking CSF. 4. No herniation or infarct. Electronically Signed   By: Monte Fantasia M.D.   On: 04/02/2018 13:14   Dg Chest Port 1 View  Result Date: 04/02/2018 CLINICAL DATA:  77 year old female with a history of endotracheal tube placement EXAM: PORTABLE CHEST 1 VIEW COMPARISON:  04/02/2018, 04/01/2018 FINDINGS: Cardiomediastinal silhouette unchanged in size and contour. Right rotation somewhat accentuates the heart border. Endotracheal tube terminates approximately 2.0 cm above the carina, slightly withdrawn from the prior. Gastric tube projects over the mediastinum, terminating out of the field of view. Low lung volumes. No large pleural effusion. Similar appearance of patchy airspace opacities at the lung bases. No pneumothorax. IMPRESSION: Endotracheal tube terminates approximately 2.0 cm by of the carina, slightly withdrawn from the prior. Unchanged gastric tube. Similar appearance of low lung volumes and patchy opacities at the lung bases, likely atelectasis. Electronically Signed   By: Corrie Mckusick D.O.   On: 04/02/2018 16:18     STUDIES:  CT head 8/22 > 2 cm hematoma in the deep left cerebellum with fourth ventricular extension extending into the third/lateral ventricles and into the dorsal cervical canal. There is mild hydrocephalus at the lateral ventricles when compared to 2016. A deep cerebellar origin favors hypertensive etiology. CT angio head 8/22 > No vascular malformation or abnormal enhancement of the brain identified. Stable  cerebellar and intraventricular hemorrhage. Patent anterior and posterior intracranial circulation. No large vessel occlusion, aneurysm, or significant stenosis. Calcific atherosclerosis of the carotid and vertebral arteries with mild stenosis. CT Head w/o 8/27 >  Partial interval dispersion of intraventricular, subarachnoid and left cerebellar brain parenchymal hemorrhage.  Resolution of hydrocephalus.  Stable position of right frontal approach ventric catheter, no new acute process.   CULTURES: BCx2 8/26 >>  Tracheal Aspirate 8/26 >> negative  CSF 8/29 >>  ANTIBIOTICS: Vanco 8/26 >>  Cefepime 8/26 >>   SIGNIFICANT EVENTS: 8/22 Admit, EVD placed.  8/26 Fever, empiric abx added  8/28 EVD replaced (clogged), ETT replaced (bit through).  Added provigil.   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 Secondary to hypertensive crisis. Initial pressures as high as 209/158.  Obstructive Hydrocephalus s/p EVD (8/22) P: CSF culture per NSGY  SBP goal <160 Continue am provigil, nighttime rest  Management per NSGY / Neurology  Hold home antiplatelets  Passive ROM   Acute Hypoxic Respiratory failure: Intubated for airway protection  in the setting of intracranial hemorrhage Hx Tobacco Abuse  P: PRVC as rest mode  PSV during day as tolerated  O2 to support sats > 90% Pending neuro exam, family may want to pursue trach   COPD - without acute exacerbation P: Duoneb Q6  Brovana + Pulmicort BID   Osteoarthritis P: Continue prednisone 5mg  QD   Chronic CHF P: Monitor in ICU  PO norvasc, labetalol  PRN hydralazine for BP goal   Non AG Metabolic acidosis due to Hyperchloremia - resolved Hyperkalemia P: Continue free water 250 Q4  Trend BMP / urinary output Replace electrolytes as indicated Avoid nephrotoxic agents, ensure adequate renal perfusion  Leukocytosis   Fever - new 8/26 P: CSF culture per NSGY  Follow cultures Continue empiric abx for now, D4/x   Anemia  P: Monitor CBC  Transfuse per ICU guidelines   Best Practice GI: PPI  DVT: SUP  Feeding: TF   Family: Daughter updated at bedside 8/29 on plan of care.    CC Time: 30 minutes   Noe Gens, NP-C [facility] Pgr: [phone] or if no answer [phone] 04/03/2018, 8:25 AM

## 2018-04-03 NOTE — Procedures (Signed)
Intubation Procedure Note Amber Rogers [id number] 07-28-1941  Procedure: Intubation Indications: stridor post extbation - called emergently to 4N30  Procedure Details Consent: Risks of procedure as well as the alternatives and risks of each were explained to the (patient/caregiver).  Consent for procedure obtained. verbally from family  Due to emergent call for post extubation stridor Time Out: Verified patient identification, verified procedure, site/side was marked, verified correct patient position, special equipment/implants available, medications/allergies/relevent history reviewed, required imaging and test results available.  Performed  Maximum sterile technique was used including cap, gloves, gown, hand hygiene and mask.  MAC - glidescope used . Mild arytenoid swelling + but mostly mucus in vocal cords   Evaluation Hemodynamic Status: BP stable throughout; O2 sats: transiently fell during during procedure - in fact she was in active stridor and desaturating upon my arrival pre-intubation Patient's Current Condition: miild hypotenson post intubation Complications: No apparent complications Patient did tolerate procedure well. Chest X-ray ordered to verify placement.  CXR: pending - et tube 24cm.    Dr. Brand Males, M.D., [facility].C.P Pulmonary and Critical Care Medicine Staff Physician, [facility] Director - Interstitial Lung Disease  Program  P[facility] [place] [facility] [place], [place], [address]  Pager: [phone], If no answer or between  15:00h - 7:00h: call [phone] Telephone: [phone]

## 2018-04-03 NOTE — Progress Notes (Signed)
At ~1800 pt developed stridor. Pt was sitting upright in bed in respiratory distress. Oxygen saturations were > 90% on 6L nasal cannula. RT and MD notified. Verbal orders received for 4mg  Decadron and racemic epi nebulizer.    1824: Notified Dr. Lynetta Mare of no improvement in respiratory distress after interventions were implemented. He will notify provider who is on campus to come assess pt. Pt unable to follow simple commands. O2 increased to 10L, sat currently 95%.   Due to interventions in progress, EVD drain temporarily clamped to prevent excess drainage.

## 2018-04-03 NOTE — Procedures (Addendum)
  NEUROSURGERY PROCEDURE NOTE   PREOP DX: Hydrocephalus  POSTOP DX: Same  PROCEDURE: Right frontal ventriculostomy replacement  SURGEON: Dr. Consuella Lose, MD  ANESTHESIA: IV Sedation (versed and fentanyl) with Local  EBL: Minimal  SPECIMENS: None  COMPLICATIONS: None  CONDITION: Hemodynamically stable  INDICATIONS: Mrs. Amber Rogers is a 77 y.o. female initially presented to the hospital with headache, CT scan demonstrating posterior fossa hemorrhage with extension into the ventricular system.  She had ventriculostomy placed with good decompression of the ventricular system.  Unfortunately overnight she was found to have no active drainage from her ventriculostomy.  She continued to have depressed mental status.  CT scan demonstrated ventricular enlargement suggestive of hydrocephalus.  Replacement of the ventricular drain was therefore indicated.  The risks and benefits of the surgery were explained in detail to the patient's family.  All questions were answered and consent was obtained.  PROCEDURE IN DETAIL: After consent was obtained from the patient's family, the right frontal region was prepped and draped in the usual sterile fashion.  The patient was administered 2 mg of Versed and 100 mcg of fentanyl.  The previous incision was then opened.  The previous ventriculostomy catheter was then removed.  A new catheter was then placed into attempts to a depth of approximately 7 cm..  Brisk flow of blood-tinged CSF was obtained.  The ventricular catheter was tunneled subcutaneously and secured with a 3-0 nylon stitch.  The incision was closed with skin staples.  Sterile dressing was applied.  FINDINGS: 1. Opening pressure approx 20cm H2O 2. Blood tinged CSF

## 2018-04-03 NOTE — Progress Notes (Signed)
EVD replaced yesterday due to dysfunction. Today appears to be functioning well with fluctuating meniscus and minimal output. Drainage is yellow and cloudy. Will obtain CSF cultures, gram stain, cell count.

## 2018-04-03 NOTE — Procedures (Signed)
Extubation Procedure Note  Patient Details:   Name: Amber Rogers DOB: 1941/03/21 MRN: [id number]   Airway Documentation:    Vent end date: 04/03/18 Vent end time: 1700   Evaluation  O2 sats: stable throughout Complications: No apparent complications Patient did tolerate procedure well. Bilateral Breath Sounds: Clear, Diminished   Yes   Pt extubated to 2L N/C.;  No stridor noted.  RN @ bedside.  Donnetta Hail 04/03/2018, 5:08 PM

## 2018-04-03 NOTE — Progress Notes (Signed)
STROKE TEAM PROGRESS NOTE   INTERVAL HISTORY Her RN is at the bedside.  Patient remained intubated off sedation. Neuro no significant change from yesterday I saw her. Her CT head showed recurrent hydrocephalus yesterday and EVD was replaced yesterday. Had spiking fever this am and will check CSF. Currently on cefepime and vanco.   Vitals:   04/03/18 0900 04/03/18 1000 04/03/18 1100 04/03/18 1123  BP: (!) 148/57 (!) 146/62 (!) 126/54 (!) 126/54  Pulse: 66 64 (!) 56 (!) 55  Resp: 20 19 20 18   Temp: (!) 100.8 F (38.2 C) 100.2 F (37.9 C) 99.3 F (37.4 C)   TempSrc: Esophageal     SpO2: 97% 97% 96% 96%  Weight:      Height:        CBC:  Recent Labs  Lab 03/27/18 1403  04/02/18 0335 04/03/18 0259  WBC 14.9*   < > 15.3* 12.4*  NEUTROABS 12.2*  --   --   --   HGB 11.6*   < > 9.1* 8.7*  HCT 37.5   < > 29.7* 28.9*  MCV 86.4   < > 87.6 88.9  PLT 308   < > 355 396   < > = values in this interval not displayed.    Basic Metabolic Panel:  Recent Labs  Lab 03/30/18 0235  03/31/18 0333  04/02/18 0335 04/03/18 0259  NA 140  --  138   < > 138 140  K 5.3*   < > 3.7   < > 4.1 4.2  CL 110  --  102   < > 104 107  CO2 21*  --  25   < > 25 24  GLUCOSE 112*  --  144*   < > 124* 125*  BUN 33*  --  39*   < > 40* 39*  CREATININE 1.43*  --  1.57*   < > 1.12* 1.11*  CALCIUM 8.3*  --  8.3*   < > 8.3* 8.4*  MG 2.3  --  2.0  --  1.9  --   PHOS 3.6  --  4.5  --   --   --    < > = values in this interval not displayed.   Lipid Panel:     Component Value Date/Time   CHOL 177 03/30/2018 0235   TRIG 67 03/30/2018 2248   HDL 101 03/30/2018 0235   CHOLHDL 1.8 03/30/2018 0235   VLDL 10 03/30/2018 0235   LDLCALC 66 03/30/2018 0235   HgbA1c:  Lab Results  Component Value Date   HGBA1C 5.5 03/30/2018   Urine Drug Screen:     Component Value Date/Time   LABOPIA NONE DETECTED 03/29/2018 1110   COCAINSCRNUR NONE DETECTED 03/29/2018 1110   LABBENZ NONE DETECTED 03/29/2018 1110   AMPHETMU  NONE DETECTED 03/29/2018 1110   THCU NONE DETECTED 03/29/2018 1110   LABBARB NONE DETECTED 03/29/2018 1110    Alcohol Level     Component Value Date/Time   ETH <10 03/27/2018 1354    IMAGING  Ct Angio Head W Or Wo Contrast 03/27/2018 IMPRESSION:  1. No vascular malformation or abnormal enhancement of the brain identified.  2. Stable cerebellar and intraventricular hemorrhage.  3. Patent anterior and posterior intracranial circulation. No large vessel occlusion, aneurysm, or significant stenosis.  4. Calcific atherosclerosis of the carotid and vertebral arteries with mild stenosis.    Ct Head Wo Contrast 03/28/2018 IMPRESSION:  1. Stable volume of intracranial hemorrhage within the left cerebellar  hemisphere and the ventricular system. Some redistribution of blood products to the lateral ventricle atria and sylvian fissures.  2. No new acute intracranial abnormality.  3. Interval right frontal approach ventriculostomy catheter placement. Mild decrease in hydrocephalus.    Ct Head Wo Contrast 03/27/2018 IMPRESSION:  2 cm hematoma in the deep left cerebellum with fourth ventricular extension extending into the third/lateral ventricles and into the dorsal cervical canal. There is mild hydrocephalus at the lateral ventricles when compared to 2016. A deep cerebellar origin favors hypertensive etiology.   CUS 1-39% ICA stenosis.  Vertebral artery flow is antegrade. Significant calcific plaque bilateral bifurcations, however, velocities are not significantly increased.   TTE - Left ventricle: The cavity size was normal. Wall thickness was   increased in a pattern of mild LVH. Systolic function was normal.   The estimated ejection fraction was in the range of 60% to 65%.   Doppler parameters are consistent with both elevated ventricular   end-diastolic filling pressure and elevated left atrial filling   pressure. - Aortic valve: There was mild stenosis. Valve area (VTI): 1.88   cm^2.  Valve area (Vmax): 1.82 cm^2. Valve area (Vmean): 1.59   cm^2. - Mitral valve: Calcified annulus. - Atrial septum: No defect or patent foramen ovale was identified.  Ct Head Wo Contrast  Result Date: 03/30/2018 CLINICAL DATA:  77 y/o F; follow-up for intracranial hemorrhage, intraventricular hemorrhage, and hydrocephalus. EXAM: CT HEAD WITHOUT CONTRAST TECHNIQUE: Contiguous axial images were obtained from the base of the skull through the vertex without intravenous contrast. COMPARISON:  03/28/2018 and 03/27/2018 CT head. FINDINGS: Brain: Stable volume acute hemorrhage within the left medial cerebellar hemisphere, ventricular system, and subarachnoid spaces of the sylvian fissures. There is some interval redistribution, for example decreased hemorrhage within the frontal horns of lateral ventricles, and increased pooling in the occipital horns. No new acute intracranial hemorrhage, stroke, or mass effect. Right frontal approach ventriculostomy catheter is stable in position traversing the frontal horn of right lateral ventricle. Stable ventricle size. Vascular: Calcific atherosclerosis of carotid siphons and vertebral arteries. Skull: Postsurgical changes related to a right frontal approach ventriculostomy catheter with decreased edema in the scalp. Sinuses/Orbits: Intubated patient. Maxillary sinus mucous retention cysts. Normal aeration of mastoid air cells. Orbits are unremarkable. Other: None. IMPRESSION: Stable volume of intraventricular, subarachnoid, and left cerebellar hemorrhage. Stable ventricle size. No new acute intracranial abnormality. Electronically Signed   By: Kristine Garbe M.D.   On: 03/30/2018 05:12    Ct Head Wo Contrast  Result Date: 04/01/2018 CLINICAL DATA:  77 y/o  F; follow-up of intracranial hemorrhage. EXAM: CT HEAD WITHOUT CONTRAST TECHNIQUE: Contiguous axial images were obtained from the base of the skull through the vertex without intravenous contrast. COMPARISON:   03/30/2018 CT head.  09/24/2014 CT head. FINDINGS: Brain: Partial interval dispersion of intraventricular, subarachnoid, and left cerebellar brain parenchymal hemorrhage. No new acute intracranial hemorrhage, stroke, or focal mass effect. Decreased size of the lateral and third ventricles, ventricle size now similar to 2016 CT head. Stable position of right frontal approach ventriculostomy catheter with tip in the right lateral ventricle near the foramen of Monro. Vascular: Calcific atherosclerosis of carotid siphons and vertebral arteries. No hyperdense vessel identified. Skull: Right frontal burr hole postsurgical changes. Torus palatini and maxillaris. Sinuses/Orbits: Endotracheal and enteric tubes. Left maxillary sinus small mucous retention cyst. Visualized paranasal sinuses and mastoid air cells are otherwise normally aerated. Orbits are unremarkable. Other: None. IMPRESSION: 1. Partial interval dispersion of intraventricular, subarachnoid, and left  cerebellar brain parenchymal hemorrhage. 2. Resolution of hydrocephalus. Stable position of right frontal approach ventriculostomy catheter. 3. No new acute intracranial process. Electronically Signed   By: Kristine Garbe M.D.   On: 04/01/2018 05:26   Dg Chest Port 1 View  Result Date: 04/01/2018 CLINICAL DATA:  Ventilator support EXAM: PORTABLE CHEST 1 VIEW COMPARISON:  03/31/2018 FINDINGS: Endotracheal tube tip is 2.5 cm above the carina. Nasogastric tube enters the stomach. Atelectasis in both lower lobes appears similar. Upper lungs remain clear. IMPRESSION: Endotracheal tube and nasogastric tube unchanged in well-positioned. Persistent atelectasis in both lower lobes. Electronically Signed   By: Nelson Chimes M.D.   On: 04/01/2018 09:08   Ct Head Wo Contrast  Result Date: 04/02/2018 CLINICAL DATA:  Postop unresponsiveness. EXAM: CT HEAD WITHOUT CONTRAST TECHNIQUE: Contiguous axial images were obtained from the base of the skull through the  vertex without intravenous contrast. COMPARISON:  Earlier today FINDINGS: Brain: Recurrent lateral and third ventricular hydrocephalus with ballooning of the horns. Clot is newly seen along the EVD catheter, which is presumably dysfunctional. There is also increased edema around the catheter which is likely tracking CSF. No rebleeding is seen. There is stable left cerebellar, intraventricular, and subarachnoid clot. Negative for infarct. Vascular: Negative Skull: Unremarkable burr hole for EVD.  Right parietal osteoma. Sinuses/Orbits: Negative Other: During reading, case discussed with RN Amber Rogers and findings were expected. She is currently calling Dr Kathyrn Sheriff for orders. IMPRESSION: 1. Recurrent lateral and third ventricular hydrocephalus. Newly seen clot along the EVD, presumed cause of catheter dysfunction. 2. No interval bleeding. 3. Increased low-density along the EVD, likely tracking CSF. 4. No herniation or infarct. Electronically Signed   By: Monte Fantasia M.D.   On: 04/02/2018 13:14     PHYSICAL EXAM  elderly lady who is intubated and sedated. She has a right frontal ventriculostomy catheter. . Afebrile. Head is nontraumatic. Neck is supple without bruit.    Cardiac exam no murmur or gallop. Lungs are clear to auscultation. Distal pulses are well felt. Neurological Exam :  Intubated, eyes closed, off sedation. Not open eyes on pain stimulation but grimace, does not follow commands. Pupils are small 2 mm pinpoint and reactive to light. Eyes right gazed preference, doll's eye sluggish. Blinking to visual threat on the right but not on the left. Corneal reflexes are brinsk. She is on CPAP and tolerating well. She has a cough and gag. Motor system exam she is able to withdraw all 4 extremities with painful stimuli but does not follow commands, able to against gravity RUE 3/5 and localizing to pain with RUE. RLE 3-/5 on pain, but LUE 1/5 and LLEs 2-/5 with mild withdraw on pain stimulation. Deep tendon  reflexes are 1+ symmetric, but babinski positive bilaterally. Sensation, coordination and gait not tested.    ASSESSMENT/PLAN Amber Rogers is a 77 y.o. female with history of COPD, CHF, HTN, HLD, CKD III, CAD s/p stent presenting with sudden onset nausea, vomiting and dizziness.   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  Mild hydrocephalus lateral ventricles.    CTA head no vascular malformation or abnormal enhancement.  Stable cerebellar and IVH.  No LVO.  Intracranial atherosclerosis.  Repeat CT x 3 - Stable  volume of intracranial hemorrhage and hydrocephalus  CUS - Significant calcific plaque bilateral bifurcations, however, velocities are not significantly increased.  2D Echo EF 60-65%  UDS neg  LDL 66  HgbA1c 5.5  Heparin subq for VTE prophylaxis  NPO on TF @ 50cc/h  aspirin 81 mg daily and Brilinta 90 mg twice daily prior to admission, now on no antithrombotics  Therapy recommendations:  Pending  Disposition:  pending   Encephalopathy   Depressed mental status even off sedation, however, following commands on 04/01/18 pm  Could be due to brainstem irritation with 4th IVH vs. Sleep wake cycle disturbance  AKI on CKD - Cre 1.36-1.33-1.35-1.43->1.57->1.25->1.12->1.11  Leukocytosis - WBC 14.0->16.2->24.8->19.2->15.3->12.4  fever Tmax 101.1->102.4->100.5->afebrile->102  CXR - bilateral basilar atelectasis  UA WBC 6-10  CSF pending  On vanco/cefepime for empirical antibiotics.   Obstructive hydrocephalus s/p R frontal EVD   EVD placed 8/22, replaced 8/28  CT showed recurrent hydro prior to EVD replacement  NSG on board  Spike fever -  CSF pending  Hyperkalemia   K 3.6->5.3->3.7->3.0->4.1->4.2  Supplement  Magnesium 1.9  BMP monitoring daily   Carotid stenosis   CUS 08/2017 R ICA stenosis 50-69%, L ICA < 50%  CUS repeat showed b/l ICA 1-39% stenosis but significant calcified plaque at carotid bifurcation bilaterally.   Acute Hypoxic Respiratory failure  Secondary to stroke  Intubated in the ED with neuro decline   off propofol and Fentanyl   Still depressed mental status  CCM on board  Tolerating weaning trials well  Hypertensive Emergency  BP stable off cardene  Home Meds: Norvasc 10, resumed  increase labetalol to 200mg  tid  SBP goal < 160  Hyperlipidemia  Home meds:  ?? Lipitor 80   LDL 66  Consider to resume statin on discharge  Other Stroke Risk Factors  Advanced age  Former Cigarette smoker, quit 2 years ago  Coronary artery disease s/p stent - on ASA and brilinta PTA  Chronic Congestive heart failure  Other Active Problems  Hx vertigo   COPD - on home prednisone per tube  Leukocytosis - 14.9 ->14.0->14.3->16.2->24.8->19.2->15.3->12.4  CKD stage III - Creatinine - 1.35->1.43->1.57->1.25->1.12->1.11  Hospital day # 7  This patient is critically ill due to cerebellar ICH with IVH and hydrocephalus, respiratory failure, hypertensive emergency and at significant risk of neurological worsening, death form recurrent bleeding, hydrocephalus, increased ICP, cerebral edema and brain death, seizure. This patient's care requires constant monitoring of vital signs, hemodynamics, respiratory and cardiac monitoring, review of multiple databases, neurological assessment, discussion with family, other specialists and medical decision making of high complexity. I spent 35 minutes of neurocritical care time in the care of this patient. I had long discussion with daughters at bedside, updated pt current condition, treatment plan and potential prognosis. They expressed understanding and  appreciation. I also discussed with Dr. Lynetta Mare in the hallway.   Rosalin Hawking, MD PhD Stroke Neurology 04/03/2018 11:30 AM   To contact Stroke Continuity provider, please refer to [email or web address] After hours, contact General Neurology

## 2018-04-03 NOTE — Plan of Care (Signed)
At 1215, pt opening eyes, moving lower extremities to simple verbal commands, and nodding appropriately to questions.

## 2018-04-04 DIAGNOSIS — G9340 Encephalopathy, unspecified: Secondary | ICD-10-CM

## 2018-04-04 LAB — PATHOLOGIST SMEAR REVIEW

## 2018-04-04 LAB — CBC
HCT: 30.1 % — ABNORMAL LOW (ref 36.0–46.0)
Hemoglobin: 9.1 g/dL — ABNORMAL LOW (ref 12.0–15.0)
MCH: 26.5 pg (ref 26.0–34.0)
MCHC: 30.2 g/dL (ref 30.0–36.0)
MCV: 87.5 fL (ref 78.0–100.0)
PLATELETS: 400 10*3/uL (ref 150–400)
RBC: 3.44 MIL/uL — AB (ref 3.87–5.11)
RDW: 15.6 % — ABNORMAL HIGH (ref 11.5–15.5)
WBC: 16.7 10*3/uL — ABNORMAL HIGH (ref 4.0–10.5)

## 2018-04-04 LAB — BASIC METABOLIC PANEL
Anion gap: 13 (ref 5–15)
BUN: 38 mg/dL — ABNORMAL HIGH (ref 8–23)
CHLORIDE: 103 mmol/L (ref 98–111)
CO2: 25 mmol/L (ref 22–32)
Calcium: 8.6 mg/dL — ABNORMAL LOW (ref 8.9–10.3)
Creatinine, Ser: 1.16 mg/dL — ABNORMAL HIGH (ref 0.44–1.00)
GFR calc Af Amer: 52 mL/min — ABNORMAL LOW (ref >60)
GFR, EST NON AFRICAN AMERICAN: 45 mL/min — AB (ref >60)
GLUCOSE: 146 mg/dL — AB (ref 70–99)
POTASSIUM: 4.5 mmol/L (ref 3.5–5.1)
Sodium: 141 mmol/L (ref 135–145)

## 2018-04-04 LAB — VANCOMYCIN, TROUGH: VANCOMYCIN TR: 12 ug/mL — AB (ref 15–20)

## 2018-04-04 LAB — GLUCOSE, CAPILLARY
GLUCOSE-CAPILLARY: 117 mg/dL — AB (ref 70–99)
GLUCOSE-CAPILLARY: 165 mg/dL — AB (ref 70–99)
GLUCOSE-CAPILLARY: 172 mg/dL — AB (ref 70–99)
Glucose-Capillary: 126 mg/dL — ABNORMAL HIGH (ref 70–99)
Glucose-Capillary: 134 mg/dL — ABNORMAL HIGH (ref 70–99)
Glucose-Capillary: 144 mg/dL — ABNORMAL HIGH (ref 70–99)

## 2018-04-04 MED ORDER — FUROSEMIDE 10 MG/ML IJ SOLN
40.0000 mg | Freq: Once | INTRAMUSCULAR | Status: AC
  Administered 2018-04-04: 40 mg via INTRAVENOUS
  Filled 2018-04-04: qty 4

## 2018-04-04 MED ORDER — PREDNISONE 5 MG PO TABS
5.0000 mg | ORAL_TABLET | Freq: Every day | ORAL | Status: DC
  Administered 2018-04-05 – 2018-04-15 (×10): 5 mg
  Filled 2018-04-04 (×11): qty 1

## 2018-04-04 MED ORDER — DEXAMETHASONE SODIUM PHOSPHATE 10 MG/ML IJ SOLN
4.0000 mg | Freq: Four times a day (QID) | INTRAMUSCULAR | Status: AC
  Administered 2018-04-04: 4 mg via INTRAVENOUS
  Filled 2018-04-04: qty 1

## 2018-04-04 MED ORDER — CHLORHEXIDINE GLUCONATE 0.12% ORAL RINSE (MEDLINE KIT)
15.0000 mL | Freq: Two times a day (BID) | OROMUCOSAL | Status: DC
  Administered 2018-04-04 – 2018-04-11 (×16): 15 mL via OROMUCOSAL

## 2018-04-04 MED ORDER — SODIUM CHLORIDE 0.9 % IV SOLN
INTRAVENOUS | Status: DC
  Administered 2018-04-04 – 2018-04-17 (×10): via INTRAVENOUS

## 2018-04-04 MED ORDER — ORAL CARE MOUTH RINSE
15.0000 mL | OROMUCOSAL | Status: DC
  Administered 2018-04-04 – 2018-04-11 (×76): 15 mL via OROMUCOSAL

## 2018-04-04 MED ORDER — VANCOMYCIN HCL 10 G IV SOLR
1250.0000 mg | INTRAVENOUS | Status: DC
  Administered 2018-04-05 – 2018-04-06 (×2): 1250 mg via INTRAVENOUS
  Filled 2018-04-04 (×3): qty 1250

## 2018-04-04 MED ORDER — VANCOMYCIN HCL 500 MG IV SOLR
500.0000 mg | Freq: Once | INTRAVENOUS | Status: AC
  Administered 2018-04-04: 500 mg via INTRAVENOUS
  Filled 2018-04-04: qty 500

## 2018-04-04 MED ORDER — SODIUM CHLORIDE 0.9 % IV SOLN
2.0000 g | INTRAVENOUS | Status: DC
  Administered 2018-04-05 – 2018-04-06 (×2): 2 g via INTRAVENOUS
  Filled 2018-04-04 (×3): qty 2

## 2018-04-04 MED ORDER — SODIUM CHLORIDE 0.9 % IV SOLN: 250.0000 mL | INTRAVENOUS | Status: DC

## 2018-04-04 NOTE — Progress Notes (Signed)
Pharmacy Antibiotic Note  Amber Rogers is a 77 y.o. female admitted on 03/27/2018 with L cerebellum hematoma which required EVD placement.  Pharmacy consulted for Cefepime and Vancomyin dosing for empiric coverage of possible EVD infection in patient with fever up to 102 on 8/26.  Tmax 100.4 past 24 hours, WBC 24.8->19.2>16.7 (on pred 5mg  as PTA).   SCr back down to 1.16  Vancomycin trough drawn today was 12 mcg/ml (goal for empiric coverage of CSF is 15 - 20 mcg/ml)  Plan: Give an additional dose of vancomycin 500 mg x 1 today, then increase Vancomycin to 1250mg  IV q24h Increase Cefepime to 2gm IV q24h for CSF coverage Will f/u renal function, micro data, and pt's clinical condition  Height: 5\' 6"  (167.6 cm) Weight: 155 lb 10.3 oz (70.6 kg) IBW/kg (Calculated) : 59.3  Temp (24hrs), Avg:99.4 F (37.4 C), Min:98.4 F (36.9 C), Max:100.4 F (38 C)  Recent Labs  Lab 03/31/18 0333 04/01/18 0404 04/02/18 0335 04/03/18 0259 04/04/18 0248 04/04/18 1038  WBC 24.8* 19.2* 15.3* 12.4* 16.7*  --   CREATININE 1.57* 1.25* 1.12* 1.11* 1.16*  --   VANCOTROUGH  --   --   --   --   --  12*    Estimated Creatinine Clearance: 39.8 mL/min (A) (by C-G formula based on SCr of 1.16 mg/dL (H)).    No Known Allergies  Antimicrobials this admission: 8/26 Cefepime >>  8/26 Vancomycin >>   Dose adjustments this admission:   Microbiology results: 8/25 BCx: ngtd 8/26 Trach asp: ngtd   8/23 MRSA PCR: negative 8/29 CSF: pending  Thank you for allowing pharmacy to be a part of this patient's care.  Alanda Slim, PharmD, [facility] Clinical Pharmacist Please see AMION for all Pharmacists' Contact Phone Numbers 04/04/2018, 12:04 PM

## 2018-04-04 NOTE — Progress Notes (Signed)
EVD remains functional. Bloody clear discharge with fluctuating meniscus. Minimal output. CSF gram stain yesterday without identifiable organism. Cultures pending although doubtful to be positive.

## 2018-04-04 NOTE — Plan of Care (Signed)
Pt with RR in the 40s, restless and agitated. CPOT score 5, biting on ETT. PRN Fentanyl administered. RT notified, who put her back on full support and will put in a bite block as pt has bitten through previous ETTs.

## 2018-04-04 NOTE — Progress Notes (Addendum)
PULMONARY / CRITICAL CARE MEDICINE   Name: Amber Rogers MRN: [id number] DOB: 05-Sep-1940    ADMISSION DATE:  03/27/2018 CONSULTATION DATE:  03/27/2018  REFERRING MD:  Dr. Leonel Ramsay  CHIEF COMPLAINT:  ICH  BRIEF SUMMARY:  77 year old female with PMH of HTN, CKD, CHF (EF 55% and grade 2 DD in 2016), and COPD. She presented to [facility] ED 8/22 with complaints of headache, nausea, and vomiting with onset of symptoms around 1200. She was found to be hypertensive. CT scan demonstrated hematoma in the left cerebellum with early hydrocephalus. Initially no neurosurgical intervention was indicated due to her reasonable mental status and dual antiplatelet regimen, however, her condition continued to decline and she required intubation. She was then deemed a candidate for EVD placement. PCCM consulted for medical/vent/BP management.  SUBJECTIVE:   Had stridor and resp distress develop gradually after extubation  with hypoxia yesterday , re-intubated last pm. Glidescope noted mild arytenoid swelling with mucus at vocal cords. Given racemic epi, and steroids  Febrile and wbc tr up .  Remains positive 13L +bal since admit  Weaning this am , good volumes and O2 sats  Groggy , follows commands intermittently (no sedation)   VITAL SIGNS: BP (!) 150/57 (BP Location: Right Arm)   Pulse 72   Temp (!) 100.4 F (38 C) (Axillary) Comment: tylenol given Simultaneous filing. User may not have seen previous data. Comment (Src): Simultaneous filing. User may not have seen previous data.  Resp (!) 22   Ht _0  (1.676 m)   Wt 70.6 kg   SpO2 97%   BMI 25.12 kg/m   HEMODYNAMICS:    VENTILATOR SETTINGS: Vent Mode: CPAP;PSV FiO2 (%):  [30 %-100 %] 40 % Set Rate:  [15 bmp] 15 bmp Vt Set:  [490 mL] 490 mL PEEP:  [5 cmH20] 5 cmH20 Pressure Support:  [8 cmH20] 8 cmH20 Plateau Pressure:  [17 cmH20-22 cmH20] 17 cmH20  INTAKE / OUTPUT: I/O last 3 completed shifts: In: 7914.8 [I.V.:2562.3; Other:10;  TZ/GY:1749.4; IV Piggyback:1648.3] Out: 2120 [Urine:1800; Drains:320]  PHYSICAL EXAMINATION: General: elderly female in NAD on vent  HEENT: MM pink/moist, ETT, R IVC drain in place  Neuro: eyes closed, pupils 18m, grimaces to pain, moves all extremities , f/c intermittently  CV: s1s2 rrr, no m/r/g PULM: even/non-labored, lungs bilaterally clear, diminished bases  GWH:QPRF non-tender, bsx4 active  Extremities: warm/dry, 1-2+ edema in hands, none in LE's  Skin: no rashes or lesions  LABS:  BMET Recent Labs  Lab 04/02/18 0335 04/03/18 0259 04/04/18 0248  NA 138 140 141  K 4.1 4.2 4.5  CL 104 107 103  CO2 _1 BUN 40* 39* 38*  CREATININE 1.12* 1.11* 1.16*  GLUCOSE 124* 125* 146*   Electrolytes Recent Labs  Lab 03/29/18 1708 03/30/18 0235 03/31/18 0333  04/02/18 0335 04/03/18 0259 04/04/18 0248  CALCIUM  --  8.3* 8.3*   < > 8.3* 8.4* 8.6*  MG 2.2 2.3 2.0  --  1.9  --   --   PHOS 3.6 3.6 4.5  --   --   --   --    < > = values in this interval not displayed.   CBC Recent Labs  Lab 04/02/18 0335 04/03/18 0259 04/04/18 0248  WBC 15.3* 12.4* 16.7*  HGB 9.1* 8.7* 9.1*  HCT 29.7* 28.9* 30.1*  PLT 355 396 400   Coag's No results for input(s): APTT, INR in the last 168 hours. Sepsis Markers No results for  input(s): LATICACIDVEN, PROCALCITON, O2SATVEN in the last 168 hours.  ABG Recent Labs  Lab 03/30/18 0331 03/31/18 0325 04/03/18 2023  PHART 7.270* 7.395 7.376  PCO2ART 49.4* 38.9 45.5  PO2ART 85.0 85.0 300.0*    Liver Enzymes No results for input(s): AST, ALT, ALKPHOS, BILITOT, ALBUMIN in the last 168 hours.  Cardiac Enzymes No results for input(s): TROPONINI, PROBNP in the last 168 hours.  Glucose Recent Labs  Lab 04/03/18 1243 04/03/18 1617 04/03/18 2004 04/03/18 2340 04/04/18 0336 04/04/18 0828  GLUCAP 138* 117* 134* 146* 117* 165*    Imaging Dg Chest Port 1 View  Result Date: 04/03/2018 CLINICAL DATA:  Acute respiratory failure  EXAM: PORTABLE CHEST 1 VIEW COMPARISON:  04/03/2018 FINDINGS: Endotracheal tube and NG tube are unchanged. Cardiomegaly with vascular congestion. Small bilateral effusions with bibasilar atelectasis. IMPRESSION: Cardiomegaly with vascular congestion. Small effusions with bibasilar atelectasis. No change. Electronically Signed   By: Rolm Baptise M.D.   On: 04/03/2018 19:57   Dg Abd Portable 1v  Result Date: 04/03/2018 CLINICAL DATA:  NG tube placement EXAM: PORTABLE ABDOMEN - 1 VIEW COMPARISON:  None. FINDINGS: NG tube tip is in the distal stomach near the pylorus. Bibasilar atelectasis. Gas within mildly distended colon. IMPRESSION: NG tube tip in the distal stomach. Electronically Signed   By: Rolm Baptise M.D.   On: 04/03/2018 21:28     STUDIES:  CT head 8/22 > 2 cm hematoma in the deep left cerebellum with fourth ventricular extension extending into the third/lateral ventricles and into the dorsal cervical canal. There is mild hydrocephalus at the lateral ventricles when compared to 2016. A deep cerebellar origin favors hypertensive etiology. CT angio head 8/22 > No vascular malformation or abnormal enhancement of the brain identified. Stable cerebellar and intraventricular hemorrhage. Patent anterior and posterior intracranial circulation. No large vessel occlusion, aneurysm, or significant stenosis. Calcific atherosclerosis of the carotid and vertebral arteries with mild stenosis. CT Head w/o 8/27 >  Partial interval dispersion of intraventricular, subarachnoid and left cerebellar brain parenchymal hemorrhage.  Resolution of hydrocephalus.  Stable position of right frontal approach ventric catheter, no new acute process.   CULTURES: BCx2 8/26 >>  Tracheal Aspirate 8/26 >> negative  CSF 8/29 >>  ANTIBIOTICS: Vanco 8/26 >>  Cefepime 8/26 >>   SIGNIFICANT EVENTS: 8/22 Admit, EVD placed.  8/26 Fever, empiric abx added  8/28 EVD replaced (clogged), ETT replaced (bit through).  Added  provigil.   LINES/TUBES: ETT 8/22 > 8/28, 8/28 >8/29 , 8/29 > EVD 8/22 >  ASSESSMENT / PLAN:  Intracranial hemorrhage - 2cm L cerebellar hematoma with intraventricular extension. Secondary to hypertensive crisis. Initial pressures as high as 209/158.  Obstructive Hydrocephalus s/p EVD (8/22) P: CSF culture per NSGY  SBP goal <160 Continue am provigil, nighttime rest  Management per NSGY / Neurology  Hold home antiplatelets  Passive ROM   Acute Hypoxic Respiratory failure: Intubated for airway protection in the setting of intracranial hemorrhage Hx Tobacco Abuse  -8/29 reintubated for stridor  P: PRVC as rest mode  PSV during day as tolerated  Wean as able, mentation this am barrier for extubation  O2 to support sats > 90% Pending neuro exam, family may want to pursue trach  Check cxr in am  Decadron  - complete for total of 4 doses   COPD - without acute exacerbation P: Duoneb Q6  Brovana + Pulmicort BID   Osteoarthritis P: Restart prednisone 77m QD tomorrow   Chronic CHF +13 L Bal since admit  P: Monitor in ICU  PO norvasc, labetalol  PRN hydralazine for BP goal  Lasix 21m IV x1 KVO IVF   Non AG Metabolic acidosis due to Hyperchloremia - resolved Hyperkalemia-resolved  P: Continue free water 250 Q4  Trend BMP / urinary output Replace electrolytes as indicated Avoid nephrotoxic agents, ensure adequate renal perfusion  Leukocytosis  Fever - new 8/26 P: CSF culture per NSGY  Follow cultures Continue empiric abx for now, D5/x -at day 7 consider d/c as suspect fever is neurogenic   Anemia  P: Monitor CBC  Transfuse per ICU guidelines   Best Practice GI: PPI  DVT: SUP  Feeding: TF   Family: Daughter updated at bedside 8/30  on plan of care.    Micha Erck NP-C  [facility]  [phone]  04/04/2018

## 2018-04-04 NOTE — Progress Notes (Signed)
STROKE TEAM PROGRESS NOTE   INTERVAL HISTORY Her RN and daughter are at the bedside.  Patient followed commands yesterday afternoon and then was extubated. However, she was re-intubated after one hour due to respiratory distress. Currently pt not following commands, not open eyes, neuro exam similar as yesterday morning.     Vitals:   04/04/18 0700 04/04/18 0723 04/04/18 0728 04/04/18 0732  BP: (!) 145/58 (!) 145/58    Pulse: 71 73    Resp: (!) 22 (!) 25    Temp:      TempSrc:      SpO2: 98% 98% 98% 98%  Weight:      Height:        CBC:  Recent Labs  Lab 04/03/18 0259 04/04/18 0248  WBC 12.4* 16.7*  HGB 8.7* 9.1*  HCT 28.9* 30.1*  MCV 88.9 87.5  PLT 396 637    Basic Metabolic Panel:  Recent Labs  Lab 03/30/18 0235  03/31/18 0333  04/02/18 0335 04/03/18 0259 04/04/18 0248  NA 140  --  138   < > 138 140 141  K 5.3*   < > 3.7   < > 4.1 4.2 4.5  CL 110  --  102   < > 104 107 103  CO2 21*  --  25   < > 25 24 25   GLUCOSE 112*  --  144*   < > 124* 125* 146*  BUN 33*  --  39*   < > 40* 39* 38*  CREATININE 1.43*  --  1.57*   < > 1.12* 1.11* 1.16*  CALCIUM 8.3*  --  8.3*   < > 8.3* 8.4* 8.6*  MG 2.3  --  2.0  --  1.9  --   --   PHOS 3.6  --  4.5  --   --   --   --    < > = values in this interval not displayed.   Lipid Panel:     Component Value Date/Time   CHOL 177 03/30/2018 0235   TRIG 67 03/30/2018 2248   HDL 101 03/30/2018 0235   CHOLHDL 1.8 03/30/2018 0235   VLDL 10 03/30/2018 0235   LDLCALC 66 03/30/2018 0235   HgbA1c:  Lab Results  Component Value Date   HGBA1C 5.5 03/30/2018   Urine Drug Screen:     Component Value Date/Time   LABOPIA NONE DETECTED 03/29/2018 1110   COCAINSCRNUR NONE DETECTED 03/29/2018 1110   LABBENZ NONE DETECTED 03/29/2018 1110   AMPHETMU NONE DETECTED 03/29/2018 1110   THCU NONE DETECTED 03/29/2018 1110   LABBARB NONE DETECTED 03/29/2018 1110    Alcohol Level     Component Value Date/Time   ETH <10 03/27/2018 1354     IMAGING  Ct Angio Head W Or Wo Contrast 03/27/2018 IMPRESSION:  1. No vascular malformation or abnormal enhancement of the brain identified.  2. Stable cerebellar and intraventricular hemorrhage.  3. Patent anterior and posterior intracranial circulation. No large vessel occlusion, aneurysm, or significant stenosis.  4. Calcific atherosclerosis of the carotid and vertebral arteries with mild stenosis.    Ct Head Wo Contrast 03/28/2018 IMPRESSION:  1. Stable volume of intracranial hemorrhage within the left cerebellar hemisphere and the ventricular system. Some redistribution of blood products to the lateral ventricle atria and sylvian fissures.  2. No new acute intracranial abnormality.  3. Interval right frontal approach ventriculostomy catheter placement. Mild decrease in hydrocephalus.    Ct Head Wo Contrast 03/27/2018 IMPRESSION:  2  cm hematoma in the deep left cerebellum with fourth ventricular extension extending into the third/lateral ventricles and into the dorsal cervical canal. There is mild hydrocephalus at the lateral ventricles when compared to 2016. A deep cerebellar origin favors hypertensive etiology.   CUS 1-39% ICA stenosis.  Vertebral artery flow is antegrade. Significant calcific plaque bilateral bifurcations, however, velocities are not significantly increased.   TTE - Left ventricle: The cavity size was normal. Wall thickness was   increased in a pattern of mild LVH. Systolic function was normal.   The estimated ejection fraction was in the range of 60% to 65%.   Doppler parameters are consistent with both elevated ventricular   end-diastolic filling pressure and elevated left atrial filling   pressure. - Aortic valve: There was mild stenosis. Valve area (VTI): 1.88   cm^2. Valve area (Vmax): 1.82 cm^2. Valve area (Vmean): 1.59   cm^2. - Mitral valve: Calcified annulus. - Atrial septum: No defect or patent foramen ovale was identified.  Ct Head Wo  Contrast  Result Date: 03/30/2018 CLINICAL DATA:  77 y/o F; follow-up for intracranial hemorrhage, intraventricular hemorrhage, and hydrocephalus. EXAM: CT HEAD WITHOUT CONTRAST TECHNIQUE: Contiguous axial images were obtained from the base of the skull through the vertex without intravenous contrast. COMPARISON:  03/28/2018 and 03/27/2018 CT head. FINDINGS: Brain: Stable volume acute hemorrhage within the left medial cerebellar hemisphere, ventricular system, and subarachnoid spaces of the sylvian fissures. There is some interval redistribution, for example decreased hemorrhage within the frontal horns of lateral ventricles, and increased pooling in the occipital horns. No new acute intracranial hemorrhage, stroke, or mass effect. Right frontal approach ventriculostomy catheter is stable in position traversing the frontal horn of right lateral ventricle. Stable ventricle size. Vascular: Calcific atherosclerosis of carotid siphons and vertebral arteries. Skull: Postsurgical changes related to a right frontal approach ventriculostomy catheter with decreased edema in the scalp. Sinuses/Orbits: Intubated patient. Maxillary sinus mucous retention cysts. Normal aeration of mastoid air cells. Orbits are unremarkable. Other: None. IMPRESSION: Stable volume of intraventricular, subarachnoid, and left cerebellar hemorrhage. Stable ventricle size. No new acute intracranial abnormality. Electronically Signed   By: Kristine Garbe M.D.   On: 03/30/2018 05:12    Ct Head Wo Contrast  Result Date: 04/01/2018 CLINICAL DATA:  77 y/o  F; follow-up of intracranial hemorrhage. EXAM: CT HEAD WITHOUT CONTRAST TECHNIQUE: Contiguous axial images were obtained from the base of the skull through the vertex without intravenous contrast. COMPARISON:  03/30/2018 CT head.  09/24/2014 CT head. FINDINGS: Brain: Partial interval dispersion of intraventricular, subarachnoid, and left cerebellar brain parenchymal hemorrhage. No new  acute intracranial hemorrhage, stroke, or focal mass effect. Decreased size of the lateral and third ventricles, ventricle size now similar to 2016 CT head. Stable position of right frontal approach ventriculostomy catheter with tip in the right lateral ventricle near the foramen of Monro. Vascular: Calcific atherosclerosis of carotid siphons and vertebral arteries. No hyperdense vessel identified. Skull: Right frontal burr hole postsurgical changes. Torus palatini and maxillaris. Sinuses/Orbits: Endotracheal and enteric tubes. Left maxillary sinus small mucous retention cyst. Visualized paranasal sinuses and mastoid air cells are otherwise normally aerated. Orbits are unremarkable. Other: None. IMPRESSION: 1. Partial interval dispersion of intraventricular, subarachnoid, and left cerebellar brain parenchymal hemorrhage. 2. Resolution of hydrocephalus. Stable position of right frontal approach ventriculostomy catheter. 3. No new acute intracranial process. Electronically Signed   By: Kristine Garbe M.D.   On: 04/01/2018 05:26   Dg Chest Port 1 View  Result Date: 04/01/2018 CLINICAL DATA:  Ventilator support EXAM: PORTABLE CHEST 1 VIEW COMPARISON:  03/31/2018 FINDINGS: Endotracheal tube tip is 2.5 cm above the carina. Nasogastric tube enters the stomach. Atelectasis in both lower lobes appears similar. Upper lungs remain clear. IMPRESSION: Endotracheal tube and nasogastric tube unchanged in well-positioned. Persistent atelectasis in both lower lobes. Electronically Signed   By: Nelson Chimes M.D.   On: 04/01/2018 09:08   Ct Head Wo Contrast  Result Date: 04/02/2018 CLINICAL DATA:  Postop unresponsiveness. EXAM: CT HEAD WITHOUT CONTRAST TECHNIQUE: Contiguous axial images were obtained from the base of the skull through the vertex without intravenous contrast. COMPARISON:  Earlier today FINDINGS: Brain: Recurrent lateral and third ventricular hydrocephalus with ballooning of the horns. Clot is newly  seen along the EVD catheter, which is presumably dysfunctional. There is also increased edema around the catheter which is likely tracking CSF. No rebleeding is seen. There is stable left cerebellar, intraventricular, and subarachnoid clot. Negative for infarct. Vascular: Negative Skull: Unremarkable burr hole for EVD.  Right parietal osteoma. Sinuses/Orbits: Negative Other: During reading, case discussed with RN Roselyn Reef and findings were expected. She is currently calling Dr Kathyrn Sheriff for orders. IMPRESSION: 1. Recurrent lateral and third ventricular hydrocephalus. Newly seen clot along the EVD, presumed cause of catheter dysfunction. 2. No interval bleeding. 3. Increased low-density along the EVD, likely tracking CSF. 4. No herniation or infarct. Electronically Signed   By: Monte Fantasia M.D.   On: 04/02/2018 13:14     PHYSICAL EXAM Elderly lady who is intubated and sedated. She has a right frontal ventriculostomy catheter. . Afebrile. Head is nontraumatic. Neck is supple without bruit.    Cardiac exam no murmur or gallop. Lungs are clear to auscultation. Distal pulses are well felt. Neurological Exam :  Intubated, eyes closed, off sedation. Not open eyes on pain stimulation but grimace, does not follow commands. Pupils are small 2 mm pinpoint and reactive to light. Eyes right gazed preference, doll's eye sluggish. Blinking to visual threat on the right but not on the left. Corneal reflexes are brinsk. She is on CPAP and tolerating well. She has a cough and gag. Motor system exam she is able to withdraw all 4 extremities with painful stimuli but does not follow commands, able to against gravity RUE 3+/5 and localizing to pain with RUE. RLE 3/5 on pain, but LUE 1/5 and LLEs 2+/5 on pain stimulation. Deep tendon reflexes are 1+ symmetric, but babinski positive bilaterally. Sensation, coordination and gait not tested.    ASSESSMENT/PLAN Ms. AKSHITHA CULMER is a 77 y.o. female with history of COPD, CHF, HTN,  HLD, CKD III, CAD s/p stent presenting with sudden onset nausea, vomiting and dizziness.   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  Mild hydrocephalus lateral ventricles.    CTA head no vascular malformation or abnormal enhancement.  Stable cerebellar and IVH.  No LVO.  Intracranial atherosclerosis.  Repeat CT x 3 - Stable volume of intracranial hemorrhage and hydrocephalus  CUS - Significant calcific plaque bilateral bifurcations, however, velocities are not significantly increased.  2D Echo EF 60-65%  UDS neg  LDL 66  HgbA1c 5.5  Heparin subq for VTE prophylaxis  NPO on TF @ 50cc/h  aspirin 81 mg daily and Brilinta 90  mg twice daily prior to admission, now on no antithrombotics  Therapy recommendations:  Pending  Disposition:  pending   Encephalopathy   Depressed mental status even off sedation, however, following commands on 04/01/18 pm  Could be due to brainstem irritation with 4th IVH vs. Sleep wake cycle disturbance  AKI on CKD - Cre 1.36-1.33-1.35-1.43->1.57->1.25->1.12->1.11  Leukocytosis - WBC 14.0->16.2->24.8->19.2->15.3->12.4  fever Tmax 101.1->102.4->100.5->afebrile->102->100.4  CXR - bilateral basilar atelectasis  UA WBC 6-10  CSF WBC 46, RBC 8000, protein 84, glucose 73, culture pending  On vanco/cefepime for empirical antibiotics.   Obstructive hydrocephalus s/p R frontal EVD   EVD placed 8/22, replaced 8/28  CT showed recurrent hydro prior to EVD replacement  NSG on board  Currently EVD patent  Carotid stenosis   CUS 08/2017 R ICA stenosis 50-69%, L ICA < 50%  CUS repeat showed b/l ICA 1-39% stenosis but significant calcified  plaque at carotid bifurcation bilaterally.   Acute Hypoxic Respiratory failure - extubation and re-intubation  Intubated in the ED with neuro decline   off sedation   Still depressed mental status - however intermittent lucid periods in the afternoon and following commands.   CCM on board  Extubated and re-intubated 04/03/18  Hypertensive Emergency  BP stable off cardene  Home Meds: Norvasc 10, resumed  increase labetalol to 200mg  tid  SBP goal < 160  Hyperlipidemia  Home meds:  ?? Lipitor 80   LDL 66  Consider to resume statin on discharge  Other Stroke Risk Factors  Advanced age  Former Cigarette smoker, quit 2 years ago  Coronary artery disease s/p stent - on ASA and brilinta PTA  Chronic Congestive heart failure  Other Active Problems  Hx vertigo   COPD - on home prednisone per tube  Leukocytosis - 14.9 ->14.0->14.3->16.2->24.8->19.2->15.3->12.4->16.7  CKD stage III - Creatinine - 1.35->1.43->1.57->1.25->1.12->1.11->1.16  Hospital day # 8  This patient is critically ill due to cerebellar ICH with IVH and hydrocephalus, respiratory failure, hypertensive emergency and at significant risk of neurological worsening, death form recurrent bleeding, hydrocephalus, increased ICP, cerebral edema and brain death, seizure. This patient's care requires constant monitoring of vital signs, hemodynamics, respiratory and cardiac monitoring, review of multiple databases, neurological assessment, discussion with family, other specialists and medical decision making of high complexity. I spent 35 minutes of neurocritical care time in the care of this patient. I had long discussion with daughter at bedside, updated pt current condition, treatment plan and potential prognosis. She expressed understanding and appreciation. I also discussed with Dr. Lynetta Mare in the hallway.   Rosalin Hawking, MD PhD Stroke Neurology 04/04/2018 11:02 AM    To contact Stroke Continuity provider,  please refer to [email or web address] After hours, contact General Neurology

## 2018-04-04 NOTE — Progress Notes (Signed)
PT Cancellation Note  Patient Details Name: Amber Rogers MRN: [id number] DOB: Jul 07, 1941   Cancelled Treatment:    Reason Eval/Treat Not Completed: Medical issues which prohibited therapy New orders received, then pt was re-intubated yesterday evening. RN reports to attempt again after the weekend.  Will attempt another day   Reginia Naas 04/04/2018, 2:05 PM  Magda Kiel, [place] 04/04/2018

## 2018-04-04 NOTE — Progress Notes (Signed)
Pt with minimal urinary output via external catheter. Straight cath'd x2 this shift per urinary retention protocol, each time with over 1L of urine output. Pt is challenging to catheterize due to anatomy and intermittent agitation. Discussed with Dr. Lynetta Mare, verbal order received to insert Foley catheter if pt requires catheterization again for acute urinary retention.

## 2018-04-04 NOTE — Progress Notes (Signed)
Spoke with Raquel Sarna RN about the patient needing a central line. The patient has had 6 PIV's during hospital duration at this time, the patient has limited access and the current PIV is infusing. RN Raquel Sarna states she will pass information on to night shift to make MD aware if current PIV fails.

## 2018-04-05 ENCOUNTER — Inpatient Hospital Stay: Payer: Self-pay

## 2018-04-05 ENCOUNTER — Inpatient Hospital Stay (HOSPITAL_COMMUNITY): Payer: Medicare Other

## 2018-04-05 LAB — GLUCOSE, CAPILLARY
GLUCOSE-CAPILLARY: 133 mg/dL — AB (ref 70–99)
GLUCOSE-CAPILLARY: 134 mg/dL — AB (ref 70–99)
GLUCOSE-CAPILLARY: 103 mg/dL — AB (ref 70–99)
Glucose-Capillary: 123 mg/dL — ABNORMAL HIGH (ref 70–99)
Glucose-Capillary: 129 mg/dL — ABNORMAL HIGH (ref 70–99)
Glucose-Capillary: 133 mg/dL — ABNORMAL HIGH (ref 70–99)

## 2018-04-05 LAB — CBC
HCT: 28.1 % — ABNORMAL LOW (ref 36.0–46.0)
Hemoglobin: 8.6 g/dL — ABNORMAL LOW (ref 12.0–15.0)
MCH: 26.9 pg (ref 26.0–34.0)
MCHC: 30.6 g/dL (ref 30.0–36.0)
MCV: 87.8 fL (ref 78.0–100.0)
Platelets: 416 10*3/uL — ABNORMAL HIGH (ref 150–400)
RBC: 3.2 MIL/uL — AB (ref 3.87–5.11)
RDW: 15.6 % — ABNORMAL HIGH (ref 11.5–15.5)
WBC: 19.9 10*3/uL — AB (ref 4.0–10.5)

## 2018-04-05 LAB — BASIC METABOLIC PANEL
ANION GAP: 11 (ref 5–15)
BUN: 51 mg/dL — ABNORMAL HIGH (ref 8–23)
CO2: 26 mmol/L (ref 22–32)
Calcium: 8.7 mg/dL — ABNORMAL LOW (ref 8.9–10.3)
Chloride: 104 mmol/L (ref 98–111)
Creatinine, Ser: 1.25 mg/dL — ABNORMAL HIGH (ref 0.44–1.00)
GFR, EST AFRICAN AMERICAN: 48 mL/min — AB (ref >60)
GFR, EST NON AFRICAN AMERICAN: 41 mL/min — AB (ref >60)
GLUCOSE: 135 mg/dL — AB (ref 70–99)
POTASSIUM: 4.3 mmol/L (ref 3.5–5.1)
SODIUM: 141 mmol/L (ref 135–145)

## 2018-04-05 LAB — CULTURE, BLOOD (ROUTINE X 2)
CULTURE: NO GROWTH
Culture: NO GROWTH
Special Requests: ADEQUATE
Special Requests: ADEQUATE

## 2018-04-05 MED ORDER — SODIUM CHLORIDE 0.9% FLUSH: 10.0000 mL | INTRAVENOUS | Status: DC | PRN

## 2018-04-05 MED ORDER — DEXMEDETOMIDINE HCL IN NACL 400 MCG/100ML IV SOLN
0.4000 ug/kg/h | INTRAVENOUS | Status: DC
  Administered 2018-04-05: 0.4 ug/kg/h via INTRAVENOUS
  Administered 2018-04-06: 0.2 ug/kg/h via INTRAVENOUS
  Administered 2018-04-07: 0.4 ug/kg/h via INTRAVENOUS
  Administered 2018-04-07: 0.6 ug/kg/h via INTRAVENOUS
  Administered 2018-04-08: 0.5 ug/kg/h via INTRAVENOUS
  Administered 2018-04-08: 0.7 ug/kg/h via INTRAVENOUS
  Administered 2018-04-08: 0.5 ug/kg/h via INTRAVENOUS
  Administered 2018-04-10 – 2018-04-11 (×2): 0.4 ug/kg/h via INTRAVENOUS
  Filled 2018-04-05 (×9): qty 100

## 2018-04-05 MED ORDER — FUROSEMIDE 10 MG/ML IJ SOLN
20.0000 mg | Freq: Once | INTRAMUSCULAR | Status: AC
  Administered 2018-04-05: 20 mg via INTRAVENOUS
  Filled 2018-04-05: qty 2

## 2018-04-05 MED ORDER — SODIUM CHLORIDE 0.9% FLUSH
10.0000 mL | Freq: Two times a day (BID) | INTRAVENOUS | Status: DC
  Administered 2018-04-05 – 2018-04-07 (×5): 10 mL
  Administered 2018-04-08: 20 mL
  Administered 2018-04-08 – 2018-04-15 (×15): 10 mL
  Administered 2018-04-16: 40 mL
  Administered 2018-04-16: 10 mL
  Administered 2018-04-17: 20 mL
  Administered 2018-04-19: 10 mL
  Administered 2018-04-19: 40 mL
  Administered 2018-04-19 – 2018-04-21 (×3): 20 mL
  Administered 2018-04-21: 10 mL
  Administered 2018-04-22 – 2018-04-23 (×2): 20 mL

## 2018-04-05 MED ORDER — CHLORHEXIDINE GLUCONATE CLOTH 2 % EX PADS
6.0000 | MEDICATED_PAD | Freq: Every day | CUTANEOUS | Status: DC
  Administered 2018-04-05 – 2018-04-24 (×19): 6 via TOPICAL

## 2018-04-05 NOTE — Progress Notes (Signed)
STROKE TEAM PROGRESS NOTE   INTERVAL HISTORY Her RN and daughter are at the bedside.  Patient followed commands 04/03/18 afternoon and then was extubated. However, she was re-intubated after one hour due to respiratory distress. Currently pt   following commands, not open eyes, neuro exam similar as yesterday morning. Blood pressure adequately controlled. He is currently weaning on the ventilator. Ventriculostomy draining well but not much output at pop off at 5    Vitals:   04/05/18 0900 04/05/18 1000 04/05/18 1100 04/05/18 1203  BP: 140/75 (!) 132/59 (!) 128/56 132/60  Pulse: (!) 57 62 (!) 55   Resp: 17 16 16 16   Temp:    99.1 F (37.3 C)  TempSrc:    Oral  SpO2: 97% 96% 97% 96%  Weight:      Height:        CBC:  Recent Labs  Lab 04/04/18 0248 04/05/18 0328  WBC 16.7* 19.9*  HGB 9.1* 8.6*  HCT 30.1* 28.1*  MCV 87.5 87.8  PLT 400 416*    Basic Metabolic Panel:  Recent Labs  Lab 03/30/18 0235  03/31/18 0333  04/02/18 0335  04/04/18 0248 04/05/18 0328  NA 140  --  138   < > 138   < > 141 141  K 5.3*   < > 3.7   < > 4.1   < > 4.5 4.3  CL 110  --  102   < > 104   < > 103 104  CO2 21*  --  25   < > 25   < > 25 26  GLUCOSE 112*  --  144*   < > 124*   < > 146* 135*  BUN 33*  --  39*   < > 40*   < > 38* 51*  CREATININE 1.43*  --  1.57*   < > 1.12*   < > 1.16* 1.25*  CALCIUM 8.3*  --  8.3*   < > 8.3*   < > 8.6* 8.7*  MG 2.3  --  2.0  --  1.9  --   --   --   PHOS 3.6  --  4.5  --   --   --   --   --    < > = values in this interval not displayed.   Lipid Panel:     Component Value Date/Time   CHOL 177 03/30/2018 0235   TRIG 67 03/30/2018 2248   HDL 101 03/30/2018 0235   CHOLHDL 1.8 03/30/2018 0235   VLDL 10 03/30/2018 0235   LDLCALC 66 03/30/2018 0235   HgbA1c:  Lab Results  Component Value Date   HGBA1C 5.5 03/30/2018   Urine Drug Screen:     Component Value Date/Time   LABOPIA NONE DETECTED 03/29/2018 1110   COCAINSCRNUR NONE DETECTED 03/29/2018 1110   LABBENZ NONE DETECTED 03/29/2018 1110   AMPHETMU NONE DETECTED 03/29/2018 1110   THCU NONE DETECTED 03/29/2018 1110   LABBARB NONE DETECTED 03/29/2018 1110    Alcohol Level     Component Value Date/Time   ETH <10 03/27/2018 1354    IMAGING  Ct Angio Head W Or Wo Contrast 03/27/2018 IMPRESSION:  1. No vascular malformation or abnormal enhancement of the brain identified.  2. Stable cerebellar and intraventricular hemorrhage.  3. Patent anterior and posterior intracranial circulation. No large vessel occlusion, aneurysm, or significant stenosis.  4. Calcific atherosclerosis of the carotid and vertebral arteries with mild stenosis.    Ct Head  Wo Contrast 03/28/2018 IMPRESSION:  1. Stable volume of intracranial hemorrhage within the left cerebellar hemisphere and the ventricular system. Some redistribution of blood products to the lateral ventricle atria and sylvian fissures.  2. No new acute intracranial abnormality.  3. Interval right frontal approach ventriculostomy catheter placement. Mild decrease in hydrocephalus.    Ct Head Wo Contrast 03/27/2018 IMPRESSION:  2 cm hematoma in the deep left cerebellum with fourth ventricular extension extending into the third/lateral ventricles and into the dorsal cervical canal. There is mild hydrocephalus at the lateral ventricles when compared to 2016. A deep cerebellar origin favors hypertensive etiology.   CUS 1-39% ICA stenosis.  Vertebral artery flow is antegrade. Significant calcific plaque bilateral bifurcations, however, velocities are not significantly increased.   TTE - Left ventricle: The cavity size was normal. Wall thickness was   increased in a pattern of mild LVH. Systolic function was normal.   The estimated ejection fraction was in the range of 60% to 65%.   Doppler parameters are consistent with both elevated ventricular   end-diastolic filling pressure and elevated left atrial filling   pressure. - Aortic valve: There  was mild stenosis. Valve area (VTI): 1.88   cm^2. Valve area (Vmax): 1.82 cm^2. Valve area (Vmean): 1.59   cm^2. - Mitral valve: Calcified annulus. - Atrial septum: No defect or patent foramen ovale was identified.  Ct Head Wo Contrast  Result Date: 03/30/2018 CLINICAL DATA:  77 y/o F; follow-up for intracranial hemorrhage, intraventricular hemorrhage, and hydrocephalus. EXAM: CT HEAD WITHOUT CONTRAST TECHNIQUE: Contiguous axial images were obtained from the base of the skull through the vertex without intravenous contrast. COMPARISON:  03/28/2018 and 03/27/2018 CT head. FINDINGS: Brain: Stable volume acute hemorrhage within the left medial cerebellar hemisphere, ventricular system, and subarachnoid spaces of the sylvian fissures. There is some interval redistribution, for example decreased hemorrhage within the frontal horns of lateral ventricles, and increased pooling in the occipital horns. No new acute intracranial hemorrhage, stroke, or mass effect. Right frontal approach ventriculostomy catheter is stable in position traversing the frontal horn of right lateral ventricle. Stable ventricle size. Vascular: Calcific atherosclerosis of carotid siphons and vertebral arteries. Skull: Postsurgical changes related to a right frontal approach ventriculostomy catheter with decreased edema in the scalp. Sinuses/Orbits: Intubated patient. Maxillary sinus mucous retention cysts. Normal aeration of mastoid air cells. Orbits are unremarkable. Other: None. IMPRESSION: Stable volume of intraventricular, subarachnoid, and left cerebellar hemorrhage. Stable ventricle size. No new acute intracranial abnormality. Electronically Signed   By: Kristine Garbe M.D.   On: 03/30/2018 05:12    Ct Head Wo Contrast  Result Date: 04/01/2018 CLINICAL DATA:  77 y/o  F; follow-up of intracranial hemorrhage. EXAM: CT HEAD WITHOUT CONTRAST TECHNIQUE: Contiguous axial images were obtained from the base of the skull through  the vertex without intravenous contrast. COMPARISON:  03/30/2018 CT head.  09/24/2014 CT head. FINDINGS: Brain: Partial interval dispersion of intraventricular, subarachnoid, and left cerebellar brain parenchymal hemorrhage. No new acute intracranial hemorrhage, stroke, or focal mass effect. Decreased size of the lateral and third ventricles, ventricle size now similar to 2016 CT head. Stable position of right frontal approach ventriculostomy catheter with tip in the right lateral ventricle near the foramen of Monro. Vascular: Calcific atherosclerosis of carotid siphons and vertebral arteries. No hyperdense vessel identified. Skull: Right frontal burr hole postsurgical changes. Torus palatini and maxillaris. Sinuses/Orbits: Endotracheal and enteric tubes. Left maxillary sinus small mucous retention cyst. Visualized paranasal sinuses and mastoid air cells are otherwise normally aerated.  Orbits are unremarkable. Other: None. IMPRESSION: 1. Partial interval dispersion of intraventricular, subarachnoid, and left cerebellar brain parenchymal hemorrhage. 2. Resolution of hydrocephalus. Stable position of right frontal approach ventriculostomy catheter. 3. No new acute intracranial process. Electronically Signed   By: Kristine Garbe M.D.   On: 04/01/2018 05:26   Dg Chest Port 1 View  Result Date: 04/01/2018 CLINICAL DATA:  Ventilator support EXAM: PORTABLE CHEST 1 VIEW COMPARISON:  03/31/2018 FINDINGS: Endotracheal tube tip is 2.5 cm above the carina. Nasogastric tube enters the stomach. Atelectasis in both lower lobes appears similar. Upper lungs remain clear. IMPRESSION: Endotracheal tube and nasogastric tube unchanged in well-positioned. Persistent atelectasis in both lower lobes. Electronically Signed   By: Nelson Chimes M.D.   On: 04/01/2018 09:08   Ct Head Wo Contrast  Result Date: 04/02/2018 CLINICAL DATA:  Postop unresponsiveness. EXAM: CT HEAD WITHOUT CONTRAST TECHNIQUE: Contiguous axial images  were obtained from the base of the skull through the vertex without intravenous contrast. COMPARISON:  Earlier today FINDINGS: Brain: Recurrent lateral and third ventricular hydrocephalus with ballooning of the horns. Clot is newly seen along the EVD catheter, which is presumably dysfunctional. There is also increased edema around the catheter which is likely tracking CSF. No rebleeding is seen. There is stable left cerebellar, intraventricular, and subarachnoid clot. Negative for infarct. Vascular: Negative Skull: Unremarkable burr hole for EVD.  Right parietal osteoma. Sinuses/Orbits: Negative Other: During reading, case discussed with RN Roselyn Reef and findings were expected. She is currently calling Dr Kathyrn Sheriff for orders. IMPRESSION: 1. Recurrent lateral and third ventricular hydrocephalus. Newly seen clot along the EVD, presumed cause of catheter dysfunction. 2. No interval bleeding. 3. Increased low-density along the EVD, likely tracking CSF. 4. No herniation or infarct. Electronically Signed   By: Monte Fantasia M.D.   On: 04/02/2018 13:14     PHYSICAL EXAM Elderly lady who is intubated and sedated. She has a right frontal ventriculostomy catheter. . Afebrile. Head is nontraumatic. Neck is supple without bruit.    Cardiac exam no murmur or gallop. Lungs are clear to auscultation. Distal pulses are well felt. Neurological Exam :  Intubated, eyes closed, off sedation. Does open eyes on verbal stimulation   does   follow commands well on right side and gaze. Pupils are small 2 mm pinpoint and reactive to light. Eyes right gaze preference but able to look to the left and midline, doll's eye sluggish. Blinking to visual threat on the right but not on the left. Corneal reflexes are brinsk. She is on CPAP and tolerating well. She has a cough and gag. Motor system exam she is able to withdraw all 4 extremities with painful stimuli but does not follow commands, able to against gravity RUE 3+/5 and localizing to  pain with RUE. RLE 3/5 on pain, but LUE 1/5 and LLEs 2+/5 on pain stimulation. Deep tendon reflexes are 1+ symmetric, but babinski positive bilaterally. Sensation, coordination and gait not tested.    ASSESSMENT/PLAN Ms. ASYA DERRYBERRY is a 77 y.o. female with history of COPD, CHF, HTN, HLD, CKD III, CAD s/p stent presenting with sudden onset nausea, vomiting and dizziness.   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  Mild hydrocephalus lateral ventricles.  CTA head no vascular malformation or abnormal enhancement.  Stable cerebellar and IVH.  No LVO.  Intracranial atherosclerosis.  Repeat CT x 3 - Stable volume of intracranial hemorrhage and hydrocephalus  CUS - Significant calcific plaque bilateral bifurcations, however, velocities are not significantly increased.  2D Echo EF 60-65%  UDS neg  LDL 66  HgbA1c 5.5  Heparin subq for VTE prophylaxis  NPO on TF @ 50cc/h  aspirin 81 mg daily and Brilinta 90 mg twice daily prior to admission, now on no antithrombotics  Therapy recommendations:  Pending  Disposition:  pending   Encephalopathy   Depressed mental status even off sedation, however, following commands on 04/01/18 pm  Could be due to brainstem irritation with 4th IVH vs. Sleep wake cycle disturbance  AKI on CKD - Cre 1.36-1.33-1.35-1.43->1.57->1.25->1.12->1.11  Leukocytosis - WBC 14.0->16.2->24.8->19.2->15.3->12.4  fever Tmax 101.1->102.4->100.5->afebrile->102->100.4  CXR - bilateral basilar atelectasis  UA WBC 6-10  CSF WBC 46, RBC 8000, protein 84, glucose 73, culture pending  On vanco/cefepime for empirical antibiotics.   Obstructive  hydrocephalus s/p R frontal EVD   EVD placed 8/22, replaced 8/28  CT showed recurrent hydro prior to EVD replacement  NSG on board  Currently EVD patent  Carotid stenosis   CUS 08/2017 R ICA stenosis 50-69%, L ICA < 50%  CUS repeat showed b/l ICA 1-39% stenosis but significant calcified plaque at carotid bifurcation bilaterally.   Acute Hypoxic Respiratory failure - extubation and re-intubation  Intubated in the ED with neuro decline   off sedation   Still depressed mental status - however intermittent lucid periods in the afternoon and following commands.   CCM on board  Extubated and re-intubated 04/03/18  Hypertensive Emergency  BP stable off cardene  Home Meds: Norvasc 10, resumed  increase labetalol to 200mg  tid  SBP goal < 160  Hyperlipidemia  Home meds:  ?? Lipitor 80   LDL 66  Consider to resume statin on discharge  Other Stroke Risk Factors  Advanced age  Former Cigarette smoker, quit 2 years ago  Coronary artery disease s/p stent - on ASA and brilinta PTA  Chronic Congestive heart failure  Other Active Problems  Hx vertigo   COPD - on home prednisone per tube  Leukocytosis - 14.9 ->14.0->14.3->16.2->24.8->19.2->15.3->12.4->16.7  CKD stage III - Creatinine - 1.35->1.43->1.57->1.25->1.12->1.11->1.16  Hospital day # 9 Plan continue ventilatory wean and strict blood pressure control. Change ventriculostomy pop off to 10 cm. Hopefully extubate over the next few days if tolerated. Long discussion with the daughter at the bedside and answered questions This patient is critically ill due to cerebellar ICH with IVH and hydrocephalus, respiratory failure, hypertensive emergency and at significant risk of neurological worsening, death form recurrent bleeding, hydrocephalus, increased ICP, cerebral edema and brain death, seizure. This patient's care requires constant monitoring of vital signs, hemodynamics, respiratory and cardiac monitoring, review of  multiple databases, neurological assessment, discussion with family, other specialists and medical decision making of high complexity. I spent 35 minutes of neurocritical care time in the care of this patient. I had long discussion with daughter at bedside, updated pt current condition, treatment plan and potential prognosis. She expressed understanding and appreciation. I also discussed with PCCM PA-c, Dr Vertell Limber and answered questions.    Antony Contras, MD Stroke Neurology 04/05/2018 1:43 PM    To contact Stroke Continuity provider, please refer to [email or web address] After hours, contact General Neurology

## 2018-04-05 NOTE — Progress Notes (Signed)
Subjective: Patient reports on ventilator  Objective: Vital signs in last 24 hours: Temp:  [98.7 F (37.1 C)-101.2 F (38.4 C)] 98.7 F (37.1 C) (08/31 0330) Pulse Rate:  [55-66] 58 (08/31 0825) Resp:  [15-24] 19 (08/31 0825) BP: (101-169)/(44-90) 134/54 (08/31 0825) SpO2:  [96 %-100 %] 97 % (08/31 0825) FiO2 (%):  [30 %-40 %] 30 % (08/31 0825) Weight:  [69.1 kg] 69.1 kg (08/31 0600)  Intake/Output from previous day: 08/30 0701 - 08/31 0700 In: 2502.6 [I.V.:372.2; NG/GT:1700; IV Piggyback:430.4] Out: 3854 [Urine:3675; Drains:179] Intake/Output this shift: Total I/O In: 60 [I.V.:10; NG/GT:50] Out: 79 [Urine:75; Drains:4]  Physical Exam: EVD currently at 5 cm with decreased output and stable neuro exam.  Lab Results: Recent Labs    04/04/18 0248 04/05/18 0328  WBC 16.7* 19.9*  HGB 9.1* 8.6*  HCT 30.1* 28.1*  PLT 400 416*   BMET Recent Labs    04/04/18 0248 04/05/18 0328  NA 141 141  K 4.5 4.3  CL 103 104  CO2 25 26  GLUCOSE 146* 135*  BUN 38* 51*  CREATININE 1.16* 1.25*  CALCIUM 8.6* 8.7*    Studies/Results: Dg Chest Port 1 View  Result Date: 04/05/2018 CLINICAL DATA:  Respiratory failure EXAM: PORTABLE CHEST 1 VIEW COMPARISON:  Two days ago FINDINGS: Endotracheal tube tip 1 cm above the carina. An orogastric tube reaches the stomach. Haziness of the lower chest with streaky density. No edema, effusion, or pneumothorax. Chronic cardiomegaly. IMPRESSION: 1. Stable hardware positioning. Endotracheal tube tip is 1 cm above the carina. 2. Low lung volumes with streaky opacities at the bases favoring atelectasis. Aspiration and infection is not excluded. Electronically Signed   By: Monte Fantasia M.D.   On: 04/05/2018 08:29   Dg Chest Port 1 View  Result Date: 04/03/2018 CLINICAL DATA:  Acute respiratory failure EXAM: PORTABLE CHEST 1 VIEW COMPARISON:  04/03/2018 FINDINGS: Endotracheal tube and NG tube are unchanged. Cardiomegaly with vascular congestion. Small  bilateral effusions with bibasilar atelectasis. IMPRESSION: Cardiomegaly with vascular congestion. Small effusions with bibasilar atelectasis. No change. Electronically Signed   By: Rolm Baptise M.D.   On: 04/03/2018 19:57   Dg Abd Portable 1v  Result Date: 04/03/2018 CLINICAL DATA:  NG tube placement EXAM: PORTABLE ABDOMEN - 1 VIEW COMPARISON:  None. FINDINGS: NG tube tip is in the distal stomach near the pylorus. Bibasilar atelectasis. Gas within mildly distended colon. IMPRESSION: NG tube tip in the distal stomach. Electronically Signed   By: Rolm Baptise M.D.   On: 04/03/2018 21:28    Assessment/Plan: Per discussion with Dr. Leonie Man, will raise EVD to 10 cm and consider clamping with eventual removal.      LOS: 9 days    Fynlee Rowlands D, MD 04/05/2018, 10:25 AM

## 2018-04-05 NOTE — Progress Notes (Signed)
PULMONARY / CRITICAL CARE MEDICINE   Name: MINERVA BLUETT MRN: [id number] DOB: 29-Jul-1941    ADMISSION DATE:  03/27/2018 CONSULTATION DATE:  03/27/2018  REFERRING MD:  Dr. Leonel Ramsay  CHIEF COMPLAINT:  ICH  BRIEF SUMMARY:  77 year old female with PMH of HTN, CKD, CHF (EF 55% and grade 2 DD in 2016), and COPD. She presented to [facility] ED 8/22 with complaints of headache, nausea, and vomiting with onset of symptoms around 1200. She was found to be hypertensive. CT scan demonstrated hematoma in the left cerebellum with early hydrocephalus. Initially no neurosurgical intervention was indicated due to her reasonable mental status and dual antiplatelet regimen, however, her condition continued to decline and she required intubation. She was then deemed a candidate for EVD placement. PCCM consulted for medical/vent/BP management.  SUBJECTIVE:   Increased alertness this am, followed simple commands, maex 4  Lasix given yesterday with good UOP (-1.4L x 24h)  Weaned well yesterday .  Fevers and elevated wbc persist -on abx. Cx pending   VITAL SIGNS: BP (!) 141/62   Pulse (!) 59   Temp 98.7 F (37.1 C) (Oral)   Resp 18   Ht _0  (1.676 m)   Wt 69.1 kg   SpO2 96%   BMI 24.59 kg/m   HEMODYNAMICS:    VENTILATOR SETTINGS: Vent Mode: PRVC FiO2 (%):  [40 %] 40 % Set Rate:  [15 bmp] 15 bmp Vt Set:  [490 mL] 490 mL PEEP:  [5 cmH20] 5 cmH20 Pressure Support:  [8 cmH20] 8 cmH20 Plateau Pressure:  [10 cmH20-17 cmH20] 16 cmH20  INTAKE / OUTPUT: I/O last 3 completed shifts: In: 5936.7 [I.V.:1696.2; XT/KW:4097.3; IV Piggyback:1656.3] Out: 5329 [Urine:4375; Drains:268]  PHYSICAL EXAMINATION: General: Elderly female in no acute distress on vent  HEENT: MMM, pink and moist ETT R IVC drain in place  Neuro: Opens eyes, follows simple commands increased alertness moves all extremities x4  CV: S1-S2, RRR, no MRG  PULM: Clear to auscultation, decreased breath sounds in the bases  GI: Soft,  bowel sounds positive, tube feeds Extremities: Warm dry, 1+ edema  Skin: Intact without rashes  LABS:  BMET Recent Labs  Lab 04/03/18 0259 04/04/18 0248 04/05/18 0328  NA 140 141 141  K 4.2 4.5 4.3  CL 107 103 104  CO2 _1 BUN 39* 38* 51*  CREATININE 1.11* 1.16* 1.25*  GLUCOSE 125* 146* 135*   Electrolytes Recent Labs  Lab 03/29/18 1708  03/30/18 0235 03/31/18 0333  04/02/18 0335 04/03/18 0259 04/04/18 0248 04/05/18 0328  CALCIUM  --    < > 8.3* 8.3*   < > 8.3* 8.4* 8.6* 8.7*  MG 2.2  --  2.3 2.0  --  1.9  --   --   --   PHOS 3.6  --  3.6 4.5  --   --   --   --   --    < > = values in this interval not displayed.   CBC Recent Labs  Lab 04/03/18 0259 04/04/18 0248 04/05/18 0328  WBC 12.4* 16.7* 19.9*  HGB 8.7* 9.1* 8.6*  HCT 28.9* 30.1* 28.1*  PLT 396 400 416*   Coag's No results for input(s): APTT, INR in the last 168 hours. Sepsis Markers No results for input(s): LATICACIDVEN, PROCALCITON, O2SATVEN in the last 168 hours.  ABG Recent Labs  Lab 03/30/18 0331 03/31/18 0325 04/03/18 2023  PHART 7.270* 7.395 7.376  PCO2ART 49.4* 38.9 45.5  PO2ART 85.0 85.0 300.0*  Liver Enzymes No results for input(s): AST, ALT, ALKPHOS, BILITOT, ALBUMIN in the last 168 hours.  Cardiac Enzymes No results for input(s): TROPONINI, PROBNP in the last 168 hours.  Glucose Recent Labs  Lab 04/04/18 0828 04/04/18 1140 04/04/18 1549 04/04/18 2005 04/04/18 2355 04/05/18 0354  GLUCAP 165* 172* 134* 144* 126* 123*    Imaging No results found.   STUDIES:  CT head 8/22 > 2 cm hematoma in the deep left cerebellum with fourth ventricular extension extending into the third/lateral ventricles and into the dorsal cervical canal. There is mild hydrocephalus at the lateral ventricles when compared to 2016. A deep cerebellar origin favors hypertensive etiology. CT angio head 8/22 > No vascular malformation or abnormal enhancement of the brain identified. Stable  cerebellar and intraventricular hemorrhage. Patent anterior and posterior intracranial circulation. No large vessel occlusion, aneurysm, or significant stenosis. Calcific atherosclerosis of the carotid and vertebral arteries with mild stenosis. CT Head w/o 8/27 >  Partial interval dispersion of intraventricular, subarachnoid and left cerebellar brain parenchymal hemorrhage.  Resolution of hydrocephalus.  Stable position of right frontal approach ventric catheter, no new acute process.   CULTURES: BCx2 8/26 >>  Tracheal Aspirate 8/26 >> negative  CSF 8/29 >>  ANTIBIOTICS: Vanco 8/26 >>  Cefepime 8/26 >>   SIGNIFICANT EVENTS: 8/22 Admit, EVD placed.  8/26 Fever, empiric abx added  8/28 EVD replaced (clogged), ETT replaced (bit through).  Added provigil.  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 Secondary to hypertensive crisis. Initial pressures as high as 209/158.  Obstructive Hydrocephalus s/p EVD (8/22) P: CSF culture per NSGY  SBP goal <160 Continue am provigil, nighttime rest  Management per NSGY / Neurology  Hold home antiplatelets  Passive ROM   Acute Hypoxic Respiratory failure: Intubated for airway protection in the setting of intracranial hemorrhage Hx Tobacco Abuse  -8/29 reintubated for stridor s/p racemic epi and decadron  P: PRVC as rest mode  PSV during day as tolerated  Wean as able,  O2 to support sats > 90% If unable to extubate, may need to consider trach next week.    COPD - without acute exacerbation P: Duoneb Q6  Brovana + Pulmicort BID   Osteoarthritis P: Cont prednisone 37m QD (chronic dose)   Chronic CHF +12 L Bal since admit   P: Monitor in ICU  PO norvasc, labetalol  PRN hydralazine for BP goal  Lasix 257mIV x 1 today   Non AG Metabolic acidosis due to  Hyperchloremia - resolved Hyperkalemia-resolved  P: Cont free water 250 Q8 Trend BMP / urinary output Replace electrolytes as indicated Avoid nephrotoxic agents, ensure adequate renal perfusion  Leukocytosis  Fever - new 8/26 P: CSF culture per NSGY  Follow cultures Continue empiric abx for now, D6/x -at day 7 consider d/c as suspect fever is neurogenic   Anemia  P: Monitor CBC  Transfuse per ICU guidelines   Best Practice GI: PPI  DVT: Hep  Feeding: TF   Family: Daughter updated at bedside 8/30  on plan of care.    Tammy Parrett NP-C  [facility]  [phone] 04/05/2018     Attending Note:  I have examined patient, reviewed labs, studies and notes.   [age]ear old woman with hypertension, CKD, diastolic CHF, COPD, admitted with an intracranial hemorrhage that required  ventriculostomy.  Intubated for airway protection.  She failed extubation on 8/29 due to stridor.  Steroids and racemic epi were given.  Her mental status is beginning to improve with the addition of provigil and decreased sedation.   Vitals:   04/05/18 0900 04/05/18 1000 04/05/18 1100 04/05/18 1203  BP: 140/72 (!) 132/59 (!) 128/56 132/60  Pulse: (!) 57 62 (!) 55   Resp: _0 Temp:    99.1 F (37.3 C)  TempSrc:    Oral  SpO2: 97% 96% 97% 96%  Weight:      Height:      Patient is ill-appearing, intubated.  She has ventriculostomy in place.  She is more awake, alert, nods to questions and follows commands.  She is tolerating pressure support ventilation currently without difficulty.  Lungs are decreased at the bases but otherwise clear.  Her heart is regular without a murmur.  Abdomen is soft, nontender with positive bowel sounds.  She has 1+ lower extremity edema.  CSF cultures from 8/29 are negative.  Patient is tolerating pressure support and her mental status is definitely improved.  I evaluated her for a cuff leak and there was none present.  I am still hesitant to  extubate given her recent failure due to upper airway issues.  Question whether this was amatory nature or possibly primary neurological deficit given her intracranial hemorrhage.  She likely deserves another attempt at extubation before committing her tracheostomy.  We will continue to try to make progress and assess her for possible extubation.  Continue her current antibiotics for now although cultures have been negative.  Most recent CSF culture from 8/29.    Independent critical care time is 33 minutes.   Baltazar Apo, MD, PhD 04/05/2018, 1:12 PM [facility] [phone] or if no answer [phone]

## 2018-04-05 NOTE — Progress Notes (Signed)
Peripherally Inserted Central Catheter/Midline Placement  The IV Nurse has discussed with the patient and/or persons authorized to consent for the patient, the purpose of this procedure and the potential benefits and risks involved with this procedure.  The benefits include less needle sticks, lab draws from the catheter, and the patient may be discharged home with the catheter. Risks include, but not limited to, infection, bleeding, blood clot (thrombus formation), and puncture of an artery; nerve damage and irregular heartbeat and possibility to perform a PICC exchange if needed/ordered by physician.  Alternatives to this procedure were also discussed.  Bard Power PICC patient education guide, fact sheet on infection prevention and patient information card has been provided to patient /or left at bedside.  Both daughters at bedside, signed consent due to pt intubated and sedated.  PICC/Midline Placement Documentation  PICC Double Lumen 51/70/01 PICC Right Basilic 38 cm 0 cm (Active)  Indication for Insertion or Continuance of Line Vasoactive infusions;Limited venous access - need for IV therapy >5 days (PICC only);Poor Vasculature-patient has had multiple peripheral attempts or PIVs lasting less than 24 hours 04/05/2018  1:53 PM  Exposed Catheter (cm) 0 cm 04/05/2018  1:53 PM  Site Assessment Clean;Dry;Intact 04/05/2018  1:53 PM  Lumen #1 Status Flushed;Saline locked;Blood return noted 04/05/2018  1:53 PM  Lumen #2 Status Flushed;Saline locked;Blood return noted 04/05/2018  1:53 PM  Dressing Type Transparent 04/05/2018  1:53 PM  Dressing Status Clean;Dry;Intact;Antimicrobial disc in place 04/05/2018  1:53 PM  Line Care Connections checked and tightened 04/05/2018  1:53 PM  Line Adjustment (NICU/IV Team Only) No 04/05/2018  1:53 PM  Dressing Intervention New dressing 04/05/2018  1:53 PM  Dressing Change Due 04/12/18 04/05/2018  1:53 PM       Rolena Infante 04/05/2018, 1:54 PM

## 2018-04-06 ENCOUNTER — Inpatient Hospital Stay (HOSPITAL_COMMUNITY): Payer: Medicare Other

## 2018-04-06 LAB — CBC
HCT: 27.2 % — ABNORMAL LOW (ref 36.0–46.0)
Hemoglobin: 8.1 g/dL — ABNORMAL LOW (ref 12.0–15.0)
MCH: 26.6 pg (ref 26.0–34.0)
MCHC: 29.8 g/dL — ABNORMAL LOW (ref 30.0–36.0)
MCV: 89.2 fL (ref 78.0–100.0)
Platelets: 459 10*3/uL — ABNORMAL HIGH (ref 150–400)
RBC: 3.05 MIL/uL — AB (ref 3.87–5.11)
RDW: 15.8 % — AB (ref 11.5–15.5)
WBC: 13.7 10*3/uL — AB (ref 4.0–10.5)

## 2018-04-06 LAB — GLUCOSE, CAPILLARY
GLUCOSE-CAPILLARY: 122 mg/dL — AB (ref 70–99)
GLUCOSE-CAPILLARY: 166 mg/dL — AB (ref 70–99)
GLUCOSE-CAPILLARY: 105 mg/dL — AB (ref 70–99)
Glucose-Capillary: 100 mg/dL — ABNORMAL HIGH (ref 70–99)
Glucose-Capillary: 118 mg/dL — ABNORMAL HIGH (ref 70–99)
Glucose-Capillary: 126 mg/dL — ABNORMAL HIGH (ref 70–99)

## 2018-04-06 LAB — CSF CULTURE W GRAM STAIN: Culture: NO GROWTH

## 2018-04-06 LAB — BASIC METABOLIC PANEL
ANION GAP: 8 (ref 5–15)
BUN: 51 mg/dL — ABNORMAL HIGH (ref 8–23)
CALCIUM: 8.1 mg/dL — AB (ref 8.9–10.3)
CO2: 28 mmol/L (ref 22–32)
Chloride: 101 mmol/L (ref 98–111)
Creatinine, Ser: 1.24 mg/dL — ABNORMAL HIGH (ref 0.44–1.00)
GFR, EST AFRICAN AMERICAN: 48 mL/min — AB (ref >60)
GFR, EST NON AFRICAN AMERICAN: 41 mL/min — AB (ref >60)
GLUCOSE: 141 mg/dL — AB (ref 70–99)
POTASSIUM: 4.2 mmol/L (ref 3.5–5.1)
Sodium: 137 mmol/L (ref 135–145)

## 2018-04-06 MED ORDER — HYDRALAZINE HCL 20 MG/ML IJ SOLN
10.0000 mg | INTRAMUSCULAR | Status: DC | PRN
  Administered 2018-04-06 – 2018-04-18 (×7): 10 mg via INTRAVENOUS
  Filled 2018-04-06 (×8): qty 1

## 2018-04-06 MED ORDER — LABETALOL HCL 100 MG PO TABS
100.0000 mg | ORAL_TABLET | Freq: Three times a day (TID) | ORAL | Status: DC
  Administered 2018-04-06 – 2018-04-09 (×11): 100 mg via ORAL
  Filled 2018-04-06 (×12): qty 1

## 2018-04-06 NOTE — Progress Notes (Signed)
STROKE TEAM PROGRESS NOTE   INTERVAL HISTORY Her RN is at the bedside.  Patient became restless yesterday evening requiring light sedation with Precedex. She did tolerate a trial of CPAP for a while. She had persistent bradycardia with a heart rate in the 50s. Blood pressure adequately controlled.   Ventriculostomy draining well but not much output at pop off at 10    Vitals:   04/06/18 1000 04/06/18 1100 04/06/18 1140 04/06/18 1200  BP: (!) 136/50 (!) 128/53 (!) 139/56 (!) 148/52  Pulse: (!) 58 (!) 52  (!) 56  Resp: 15 16 18 13   Temp:      TempSrc:      SpO2: 100% 100% 99% 100%  Weight:      Height:        CBC:  Recent Labs  Lab 04/05/18 0328 04/06/18 0429  WBC 19.9* 13.7*  HGB 8.6* 8.1*  HCT 28.1* 27.2*  MCV 87.8 89.2  PLT 416* 459*    Basic Metabolic Panel:  Recent Labs  Lab 03/31/18 0333  04/02/18 0335  04/05/18 0328 04/06/18 0429  NA 138   < > 138   < > 141 137  K 3.7   < > 4.1   < > 4.3 4.2  CL 102   < > 104   < > 104 101  CO2 25   < > 25   < > 26 28  GLUCOSE 144*   < > 124*   < > 135* 141*  BUN 39*   < > 40*   < > 51* 51*  CREATININE 1.57*   < > 1.12*   < > 1.25* 1.24*  CALCIUM 8.3*   < > 8.3*   < > 8.7* 8.1*  MG 2.0  --  1.9  --   --   --   PHOS 4.5  --   --   --   --   --    < > = values in this interval not displayed.   Lipid Panel:     Component Value Date/Time   CHOL 177 03/30/2018 0235   TRIG 67 03/30/2018 2248   HDL 101 03/30/2018 0235   CHOLHDL 1.8 03/30/2018 0235   VLDL 10 03/30/2018 0235   LDLCALC 66 03/30/2018 0235   HgbA1c:  Lab Results  Component Value Date   HGBA1C 5.5 03/30/2018   Urine Drug Screen:     Component Value Date/Time   LABOPIA NONE DETECTED 03/29/2018 1110   COCAINSCRNUR NONE DETECTED 03/29/2018 1110   LABBENZ NONE DETECTED 03/29/2018 1110   AMPHETMU NONE DETECTED 03/29/2018 1110   THCU NONE DETECTED 03/29/2018 1110   LABBARB NONE DETECTED 03/29/2018 1110    Alcohol Level     Component Value Date/Time   ETH  <10 03/27/2018 1354    IMAGING  Ct Angio Head W Or Wo Contrast 03/27/2018 IMPRESSION:  1. No vascular malformation or abnormal enhancement of the brain identified.  2. Stable cerebellar and intraventricular hemorrhage.  3. Patent anterior and posterior intracranial circulation. No large vessel occlusion, aneurysm, or significant stenosis.  4. Calcific atherosclerosis of the carotid and vertebral arteries with mild stenosis.    Ct Head Wo Contrast 03/28/2018 IMPRESSION:  1. Stable volume of intracranial hemorrhage within the left cerebellar hemisphere and the ventricular system. Some redistribution of blood products to the lateral ventricle atria and sylvian fissures.  2. No new acute intracranial abnormality.  3. Interval right frontal approach ventriculostomy catheter placement. Mild decrease in hydrocephalus.  Ct Head Wo Contrast 03/27/2018 IMPRESSION:  2 cm hematoma in the deep left cerebellum with fourth ventricular extension extending into the third/lateral ventricles and into the dorsal cervical canal. There is mild hydrocephalus at the lateral ventricles when compared to 2016. A deep cerebellar origin favors hypertensive etiology.   CUS 1-39% ICA stenosis.  Vertebral artery flow is antegrade. Significant calcific plaque bilateral bifurcations, however, velocities are not significantly increased.   TTE - Left ventricle: The cavity size was normal. Wall thickness was   increased in a pattern of mild LVH. Systolic function was normal.   The estimated ejection fraction was in the range of 60% to 65%.   Doppler parameters are consistent with both elevated ventricular   end-diastolic filling pressure and elevated left atrial filling   pressure. - Aortic valve: There was mild stenosis. Valve area (VTI): 1.88   cm^2. Valve area (Vmax): 1.82 cm^2. Valve area (Vmean): 1.59   cm^2. - Mitral valve: Calcified annulus. - Atrial septum: No defect or patent foramen ovale was  identified.  Ct Head Wo Contrast  Result Date: 03/30/2018 CLINICAL DATA:  77 y/o F; follow-up for intracranial hemorrhage, intraventricular hemorrhage, and hydrocephalus. EXAM: CT HEAD WITHOUT CONTRAST TECHNIQUE: Contiguous axial images were obtained from the base of the skull through the vertex without intravenous contrast. COMPARISON:  03/28/2018 and 03/27/2018 CT head. FINDINGS: Brain: Stable volume acute hemorrhage within the left medial cerebellar hemisphere, ventricular system, and subarachnoid spaces of the sylvian fissures. There is some interval redistribution, for example decreased hemorrhage within the frontal horns of lateral ventricles, and increased pooling in the occipital horns. No new acute intracranial hemorrhage, stroke, or mass effect. Right frontal approach ventriculostomy catheter is stable in position traversing the frontal horn of right lateral ventricle. Stable ventricle size. Vascular: Calcific atherosclerosis of carotid siphons and vertebral arteries. Skull: Postsurgical changes related to a right frontal approach ventriculostomy catheter with decreased edema in the scalp. Sinuses/Orbits: Intubated patient. Maxillary sinus mucous retention cysts. Normal aeration of mastoid air cells. Orbits are unremarkable. Other: None. IMPRESSION: Stable volume of intraventricular, subarachnoid, and left cerebellar hemorrhage. Stable ventricle size. No new acute intracranial abnormality. Electronically Signed   By: Kristine Garbe M.D.   On: 03/30/2018 05:12    Ct Head Wo Contrast  Result Date: 04/01/2018 CLINICAL DATA:  77 y/o  F; follow-up of intracranial hemorrhage. EXAM: CT HEAD WITHOUT CONTRAST TECHNIQUE: Contiguous axial images were obtained from the base of the skull through the vertex without intravenous contrast. COMPARISON:  03/30/2018 CT head.  09/24/2014 CT head. FINDINGS: Brain: Partial interval dispersion of intraventricular, subarachnoid, and left cerebellar brain  parenchymal hemorrhage. No new acute intracranial hemorrhage, stroke, or focal mass effect. Decreased size of the lateral and third ventricles, ventricle size now similar to 2016 CT head. Stable position of right frontal approach ventriculostomy catheter with tip in the right lateral ventricle near the foramen of Monro. Vascular: Calcific atherosclerosis of carotid siphons and vertebral arteries. No hyperdense vessel identified. Skull: Right frontal burr hole postsurgical changes. Torus palatini and maxillaris. Sinuses/Orbits: Endotracheal and enteric tubes. Left maxillary sinus small mucous retention cyst. Visualized paranasal sinuses and mastoid air cells are otherwise normally aerated. Orbits are unremarkable. Other: None. IMPRESSION: 1. Partial interval dispersion of intraventricular, subarachnoid, and left cerebellar brain parenchymal hemorrhage. 2. Resolution of hydrocephalus. Stable position of right frontal approach ventriculostomy catheter. 3. No new acute intracranial process. Electronically Signed   By: Kristine Garbe M.D.   On: 04/01/2018 05:26   Dg Chest [facility]  1 View  Result Date: 04/01/2018 CLINICAL DATA:  Ventilator support EXAM: PORTABLE CHEST 1 VIEW COMPARISON:  03/31/2018 FINDINGS: Endotracheal tube tip is 2.5 cm above the carina. Nasogastric tube enters the stomach. Atelectasis in both lower lobes appears similar. Upper lungs remain clear. IMPRESSION: Endotracheal tube and nasogastric tube unchanged in well-positioned. Persistent atelectasis in both lower lobes. Electronically Signed   By: Nelson Chimes M.D.   On: 04/01/2018 09:08   Ct Head Wo Contrast  Result Date: 04/02/2018 CLINICAL DATA:  Postop unresponsiveness. EXAM: CT HEAD WITHOUT CONTRAST TECHNIQUE: Contiguous axial images were obtained from the base of the skull through the vertex without intravenous contrast. COMPARISON:  Earlier today FINDINGS: Brain: Recurrent lateral and third ventricular hydrocephalus with ballooning  of the horns. Clot is newly seen along the EVD catheter, which is presumably dysfunctional. There is also increased edema around the catheter which is likely tracking CSF. No rebleeding is seen. There is stable left cerebellar, intraventricular, and subarachnoid clot. Negative for infarct. Vascular: Negative Skull: Unremarkable burr hole for EVD.  Right parietal osteoma. Sinuses/Orbits: Negative Other: During reading, case discussed with RN Roselyn Reef and findings were expected. She is currently calling Dr Kathyrn Sheriff for orders. IMPRESSION: 1. Recurrent lateral and third ventricular hydrocephalus. Newly seen clot along the EVD, presumed cause of catheter dysfunction. 2. No interval bleeding. 3. Increased low-density along the EVD, likely tracking CSF. 4. No herniation or infarct. Electronically Signed   By: Monte Fantasia M.D.   On: 04/02/2018 13:14     PHYSICAL EXAM Elderly lady who is intubated and sedated. She has a right frontal ventriculostomy catheter. . Afebrile. Head is nontraumatic. Neck is supple without bruit.    Cardiac exam no murmur or gallop. Lungs are clear to auscultation. Distal pulses are well felt. Neurological Exam :  Intubated, eyes closed,  On mild sedation. Does open eyes on verbal stimulation   does   follow commands well on right side and gaze. Pupils are small 2 mm pinpoint and reactive to light. Eyes right gaze preference but able to look to the left and midline, doll's eye sluggish. Blinking to visual threat on the right but not on the left. Corneal reflexes are brinsk. She is on CPAP and tolerating well. She has a cough and gag. Motor system exam she is able to withdraw all 4 extremities with painful stimuli but does not follow commands, able to against gravity RUE 3+/5 and localizing to pain with RUE. RLE 3/5 on pain, but LUE 1/5 and LLEs 2+/5 on pain stimulation. Deep tendon reflexes are 1+ symmetric, but babinski positive bilaterally. Sensation, coordination and gait not  tested.    ASSESSMENT/PLAN Ms. Amber Rogers is a 77 y.o. female with history of COPD, CHF, HTN, HLD, CKD III, CAD s/p stent presenting with sudden onset nausea, vomiting and dizziness.   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  Mild hydrocephalus lateral ventricles.    CTA head no vascular malformation or abnormal enhancement.  Stable cerebellar and IVH.  No LVO.  Intracranial atherosclerosis.  Repeat CT x 3 - Stable volume of intracranial hemorrhage and hydrocephalus  CUS - Significant calcific plaque bilateral bifurcations, however, velocities are not significantly increased.  2D Echo EF 60-65%  UDS  neg  LDL 66  HgbA1c 5.5  Heparin subq for VTE prophylaxis  NPO on TF @ 50cc/h  aspirin 81 mg daily and Brilinta 90 mg twice daily prior to admission, now on no antithrombotics  Therapy recommendations:  Pending  Disposition:  pending   Encephalopathy   Depressed mental status even off sedation, however, following commands on 04/01/18 pm  Could be due to brainstem irritation with 4th IVH vs. Sleep wake cycle disturbance  AKI on CKD - Cre 1.36-1.33-1.35-1.43->1.57->1.25->1.12->1.11  Leukocytosis - WBC 14.0->16.2->24.8->19.2->15.3->12.4  fever Tmax 101.1->102.4->100.5->afebrile->102->100.4  CXR - bilateral basilar atelectasis  UA WBC 6-10  CSF WBC 46, RBC 8000, protein 84, glucose 73, culture pending  On  cefepime for empirical antibiotics.   Obstructive hydrocephalus s/p R frontal EVD   EVD placed 8/22, replaced 8/28  CT showed recurrent hydro prior to EVD replacement  NSG on board  Currently EVD patent  Carotid stenosis   CUS 08/2017 R ICA  stenosis 50-69%, L ICA < 50%  CUS repeat showed b/l ICA 1-39% stenosis but significant calcified plaque at carotid bifurcation bilaterally.   Acute Hypoxic Respiratory failure - extubation and re-intubation  Intubated in the ED with neuro decline   off sedation   Still depressed mental status - however intermittent lucid periods in the afternoon and following commands.   CCM on board  Extubated and re-intubated 04/03/18  Hypertensive Emergency  BP stable off cardene  Home Meds: Norvasc 10, resumed  increase labetalol to 200mg  tid  SBP goal < 160  Hyperlipidemia  Home meds:  ?? Lipitor 80   LDL 66  Consider to resume statin on discharge  Other Stroke Risk Factors  Advanced age  Former Cigarette smoker, quit 2 years ago  Coronary artery disease s/p stent - on ASA and brilinta PTA  Chronic Congestive heart failure  Other Active Problems  Hx vertigo   COPD - on home prednisone per tube  Leukocytosis - 14.9 ->14.0->14.3->16.2->24.8->19.2->15.3->12.4->16.7  CKD stage III - Creatinine - 1.35->1.43->1.57->1.25->1.12->1.11->1.16  Hospital day # 10 Plan continue ventilatory wean and strict blood pressure control. Continue to wean off ventriculostomy as per neurosurgery Hopefully extubate over the next few days if tolerated. Long discussion with Dr. Lamonte Sakai and Dr. Kathyrn Sheriff: at the bedside and answered questions This patient is critically ill due to cerebellar ICH with IVH and hydrocephalus, respiratory failure, hypertensive emergency and at significant risk of neurological worsening, death form recurrent bleeding, hydrocephalus, increased ICP, cerebral edema and brain death, seizure. This patient's care requires constant monitoring of vital signs, hemodynamics, respiratory and cardiac monitoring, review of multiple databases, neurological assessment, discussion with family, other specialists and medical decision making of high complexity. I spent 35 minutes of  neurocritical care time in the care of this patient. I had long discussion with daughter at bedside, updated pt current condition, treatment plan and potential prognosis. She expressed understanding and appreciation. I also discussed with PCCM PA-c, Dr Vertell Limber and answered questions.    Antony Contras, MD Stroke Neurology 04/06/2018 12:45 PM    To contact Stroke Continuity provider, please refer to [email or web address] After hours, contact General Neurology

## 2018-04-06 NOTE — Progress Notes (Signed)
  NEUROSURGERY PROGRESS NOTE   No issues overnight.   EXAM:  BP (!) 162/67   Pulse 61   Temp 99 F (37.2 C) (Axillary)   Resp 17   Ht 5\' 6"  (1.676 m)   Wt 66.2 kg   SpO2 100%   BMI 23.56 kg/m   Somnolent but arouses Breathing over vent Follows commands, Moves all extremities, EVD in place, functional, open at 10.  IMPRESSION:  77 y.o. female with pos fossa hemorrhage and HCP. VDRF  PLAN: - Will raise EVD to 79mmHg today, cont to monitor neurologic exam. I have a high suspicion she will need permanent CSF diversion.

## 2018-04-06 NOTE — Progress Notes (Signed)
PULMONARY / CRITICAL CARE MEDICINE   Name: Amber Rogers MRN: [id number] DOB: Aug 21, 1940    ADMISSION DATE:  03/27/2018 CONSULTATION DATE:  03/27/2018  REFERRING MD:  Dr. Leonel Ramsay  CHIEF COMPLAINT:  ICH  BRIEF SUMMARY:  77 year old female with PMH of HTN, CKD, CHF (EF 55% and grade 2 DD in 2016), and COPD. She presented to [facility] ED 8/22 with complaints of headache, nausea, and vomiting with onset of symptoms around 1200. She was found to be hypertensive. CT scan demonstrated hematoma in the left cerebellum with early hydrocephalus. Initially no neurosurgical intervention was indicated due to her reasonable mental status and dual antiplatelet regimen, however, her condition continued to decline and she required intubation. She was then deemed a candidate for EVD placement. PCCM consulted for medical/vent/BP management.  SUBJECTIVE:   Bradycardia persists, HR tr low 50s  Increased agitation with stimulation , precedex started overnight at low dose .  More groggy this am .  Weaned well yesterday. Weaning this am, good volumes and O2 sats  No cuff leak yesterday or today .  Temps tr down , wbc tr down .   VITAL SIGNS: BP (!) 141/46   Pulse (!) 55   Temp 99.7 F (37.6 C) (Axillary)   Resp 15   Ht _0  (1.676 m)   Wt 66.2 kg   SpO2 100%   BMI 23.56 kg/m   HEMODYNAMICS:    VENTILATOR SETTINGS: Vent Mode: PSV;CPAP FiO2 (%):  [30 %] 30 % Set Rate:  [15 bmp] 15 bmp Vt Set:  [490 mL] 490 mL PEEP:  [5 cmH20] 5 cmH20 Pressure Support:  [5 cmH20] 5 cmH20 Plateau Pressure:  [13 cmH20-17 cmH20] 17 cmH20  INTAKE / OUTPUT: I/O last 3 completed shifts: In: 3345.3 [I.V.:395.2; NG/GT:2600; IV Piggyback:350.1] Out: 5573 [Urine:3210; Drains:171]  PHYSICAL EXAMINATION: General: Elderly female no acute distress on vent  HEENT: MMM, ETT, or IVC drain in place Neuro: Opens eyes to voice more groggy this morning agitated with stimuli, moves all extremities x4  CV: S1-S2, RRR, no MRG   PULM: Clear to auscultation decreased breath sounds in the bases on vent  GI: Soft, bowel sounds positive, tube feeds  Extremities: Warm dry, decreased edema  Skin: Intact without rash LABS:  BMET Recent Labs  Lab 04/04/18 0248 04/05/18 0328 04/06/18 0429  NA 141 141 137  K 4.5 4.3 4.2  CL 103 104 101  CO2 _1 BUN 38* 51* 51*  CREATININE 1.16* 1.25* 1.24*  GLUCOSE 146* 135* 141*   Electrolytes Recent Labs  Lab 03/31/18 0333  04/02/18 0335  04/04/18 0248 04/05/18 0328 04/06/18 0429  CALCIUM 8.3*   < > 8.3*   < > 8.6* 8.7* 8.1*  MG 2.0  --  1.9  --   --   --   --   PHOS 4.5  --   --   --   --   --   --    < > = values in this interval not displayed.   CBC Recent Labs  Lab 04/04/18 0248 04/05/18 0328 04/06/18 0429  WBC 16.7* 19.9* 13.7*  HGB 9.1* 8.6* 8.1*  HCT 30.1* 28.1* 27.2*  PLT 400 416* 459*   Coag's No results for input(s): APTT, INR in the last 168 hours. Sepsis Markers No results for input(s): LATICACIDVEN, PROCALCITON, O2SATVEN in the last 168 hours.  ABG Recent Labs  Lab 03/31/18 0325 04/03/18 2023  PHART 7.395 7.376  PCO2ART 38.9 45.5  PO2ART 85.0 300.0*    Liver Enzymes No results for input(s): AST, ALT, ALKPHOS, BILITOT, ALBUMIN in the last 168 hours.  Cardiac Enzymes No results for input(s): TROPONINI, PROBNP in the last 168 hours.  Glucose Recent Labs  Lab 04/05/18 1208 04/05/18 1505 04/05/18 1926 04/05/18 2314 04/06/18 0327 04/06/18 0745  GLUCAP 129* 134* 133* 103* 118* 122*    Imaging [place] Ekg Site Rite  Result Date: 04/05/2018 If Site Rite image not attached, placement could not be confirmed due to current cardiac rhythm.    STUDIES:  CT head 8/22 > 2 cm hematoma in the deep left cerebellum with fourth ventricular extension extending into the third/lateral ventricles and into the dorsal cervical canal. There is mild hydrocephalus at the lateral ventricles when compared to 2016. A deep cerebellar origin favors  hypertensive etiology. CT angio head 8/22 > No vascular malformation or abnormal enhancement of the brain identified. Stable cerebellar and intraventricular hemorrhage. Patent anterior and posterior intracranial circulation. No large vessel occlusion, aneurysm, or significant stenosis. Calcific atherosclerosis of the carotid and vertebral arteries with mild stenosis. CT Head w/o 8/27 >  Partial interval dispersion of intraventricular, subarachnoid and left cerebellar brain parenchymal hemorrhage.  Resolution of hydrocephalus.  Stable position of right frontal approach ventric catheter, no new acute process.   CULTURES: BCx2 8/26 >> NEG  Tracheal Aspirate 8/26 >> negative  CSF 8/29 >>  ANTIBIOTICS: Vanco 8/26 >>  Cefepime 8/26 >>   SIGNIFICANT EVENTS: 8/22 Admit, EVD placed.  8/26 Fever, empiric abx added  8/28 EVD replaced (clogged), ETT replaced (bit through).  Added provigil.  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 Secondary to hypertensive crisis. Initial pressures as high as 209/158.  Obstructive Hydrocephalus s/p EVD (8/22) Agitation  P: CSF culture per NSGY  SBP goal <160 Continue am provigil, nighttime rest  Management per NSGY / Neurology  Hold home antiplatelets  Passive ROM  Wean Precedex (added 8/31) as able   Acute Hypoxic Respiratory failure: Intubated for airway protection in the setting of intracranial hemorrhage Hx Tobacco Abuse  -8/29 reintubated for stridor s/p racemic epi and decadron  9/1 weaning well, no cuff leak  P: PRVC as rest mode  PSV during day as tolerated  Wean as able,  O2 to support sats > 90% If unable to extubate, may need to consider trach next week.    COPD - without acute exacerbation P: Duoneb Q6  Brovana + Pulmicort BID   Osteoarthritis P: Cont  prednisone 35m QD (chronic dose)   Chronic CHF +12 L Bal since admit  9/1 even bal , does not appear volume overload currently  Bradycardia  HTN   P: Monitor in ICU  PO norvasc,  Decrease Labetolol 1092mThree times a day   PRN hydralazine for BP goal  Hold diuresis today   Non AG Metabolic acidosis due to Hyperchloremia - resolved Hyperkalemia-resolved  P: Cont free water 250 Q8 Trend BMP / urinary output Replace electrolytes as indicated Avoid nephrotoxic agents, ensure adequate renal perfusion  Leukocytosis -wbc tr down  Fever - new 8/26-temp tr down  P: CSF culture per NSGY  Follow cultures -ngtd  Continue empiric abx for now, D7/x -check CSF cx in am , if neg consider d/c abx .   Anemia  P: Monitor CBC  Transfuse per ICU guidelines  Best Practice GI: PPI  DVT: Hep  Feeding: TF   Family: Daughter updated at bedside 8/30  on plan of care.    Tammy Parrett NP-C  [facility]  [phone]   04/06/2018

## 2018-04-07 ENCOUNTER — Inpatient Hospital Stay (HOSPITAL_COMMUNITY): Payer: Medicare Other

## 2018-04-07 LAB — CBC
HEMATOCRIT: 27 % — AB (ref 36.0–46.0)
Hemoglobin: 8.2 g/dL — ABNORMAL LOW (ref 12.0–15.0)
MCH: 26.7 pg (ref 26.0–34.0)
MCHC: 30.4 g/dL (ref 30.0–36.0)
MCV: 87.9 fL (ref 78.0–100.0)
PLATELETS: 430 10*3/uL — AB (ref 150–400)
RBC: 3.07 MIL/uL — ABNORMAL LOW (ref 3.87–5.11)
RDW: 15.5 % (ref 11.5–15.5)
WBC: 15.3 10*3/uL — ABNORMAL HIGH (ref 4.0–10.5)

## 2018-04-07 LAB — GLUCOSE, CAPILLARY
GLUCOSE-CAPILLARY: 96 mg/dL (ref 70–99)
Glucose-Capillary: 127 mg/dL — ABNORMAL HIGH (ref 70–99)
Glucose-Capillary: 130 mg/dL — ABNORMAL HIGH (ref 70–99)
Glucose-Capillary: 132 mg/dL — ABNORMAL HIGH (ref 70–99)
Glucose-Capillary: 133 mg/dL — ABNORMAL HIGH (ref 70–99)
Glucose-Capillary: 135 mg/dL — ABNORMAL HIGH (ref 70–99)

## 2018-04-07 LAB — BASIC METABOLIC PANEL
Anion gap: 6 (ref 5–15)
BUN: 36 mg/dL — ABNORMAL HIGH (ref 8–23)
CALCIUM: 8.3 mg/dL — AB (ref 8.9–10.3)
CO2: 29 mmol/L (ref 22–32)
CREATININE: 1.05 mg/dL — AB (ref 0.44–1.00)
Chloride: 101 mmol/L (ref 98–111)
GFR calc Af Amer: 59 mL/min — ABNORMAL LOW (ref >60)
GFR calc non Af Amer: 51 mL/min — ABNORMAL LOW (ref >60)
GLUCOSE: 124 mg/dL — AB (ref 70–99)
Potassium: 4.3 mmol/L (ref 3.5–5.1)
Sodium: 136 mmol/L (ref 135–145)

## 2018-04-07 NOTE — Progress Notes (Signed)
STROKE TEAM PROGRESS NOTE   INTERVAL HISTORY Her RN is at the bedside.  Patient  Is not alert and interactive today. She remains neurologically stable.. Blood pressure adequately controlled.   Ventriculostomy draining well but not much output at pop off at 15    Vitals:   04/07/18 1000 04/07/18 1100 04/07/18 1114 04/07/18 1200  BP: (!) 147/73 (!) 166/60 (!) 166/60 (!) 157/63  Pulse: (!) 59 62 64 62  Resp: 20 18 (!) 21 (!) 22  Temp:      TempSrc:      SpO2: 100% 100% 100% 100%  Weight:      Height:        CBC:  Recent Labs  Lab 04/06/18 0429 04/07/18 0929  WBC 13.7* 15.3*  HGB 8.1* 8.2*  HCT 27.2* 27.0*  MCV 89.2 87.9  PLT 459* 430*    Basic Metabolic Panel:  Recent Labs  Lab 04/02/18 0335  04/06/18 0429 04/07/18 0929  NA 138   < > 137 136  K 4.1   < > 4.2 4.3  CL 104   < > 101 101  CO2 25   < > 28 29  GLUCOSE 124*   < > 141* 124*  BUN 40*   < > 51* 36*  CREATININE 1.12*   < > 1.24* 1.05*  CALCIUM 8.3*   < > 8.1* 8.3*  MG 1.9  --   --   --    < > = values in this interval not displayed.   Lipid Panel:     Component Value Date/Time   CHOL 177 03/30/2018 0235   TRIG 67 03/30/2018 2248   HDL 101 03/30/2018 0235   CHOLHDL 1.8 03/30/2018 0235   VLDL 10 03/30/2018 0235   LDLCALC 66 03/30/2018 0235   HgbA1c:  Lab Results  Component Value Date   HGBA1C 5.5 03/30/2018   Urine Drug Screen:     Component Value Date/Time   LABOPIA NONE DETECTED 03/29/2018 1110   COCAINSCRNUR NONE DETECTED 03/29/2018 1110   LABBENZ NONE DETECTED 03/29/2018 1110   AMPHETMU NONE DETECTED 03/29/2018 1110   THCU NONE DETECTED 03/29/2018 1110   LABBARB NONE DETECTED 03/29/2018 1110    Alcohol Level     Component Value Date/Time   ETH <10 03/27/2018 1354    IMAGING  Ct Angio Head W Or Wo Contrast 03/27/2018 IMPRESSION:  1. No vascular malformation or abnormal enhancement of the brain identified.  2. Stable cerebellar and intraventricular hemorrhage.  3. Patent anterior  and posterior intracranial circulation. No large vessel occlusion, aneurysm, or significant stenosis.  4. Calcific atherosclerosis of the carotid and vertebral arteries with mild stenosis.    Ct Head Wo Contrast 03/28/2018 IMPRESSION:  1. Stable volume of intracranial hemorrhage within the left cerebellar hemisphere and the ventricular system. Some redistribution of blood products to the lateral ventricle atria and sylvian fissures.  2. No new acute intracranial abnormality.  3. Interval right frontal approach ventriculostomy catheter placement. Mild decrease in hydrocephalus.    Ct Head Wo Contrast 03/27/2018 IMPRESSION:  2 cm hematoma in the deep left cerebellum with fourth ventricular extension extending into the third/lateral ventricles and into the dorsal cervical canal. There is mild hydrocephalus at the lateral ventricles when compared to 2016. A deep cerebellar origin favors hypertensive etiology.   CUS 1-39% ICA stenosis.  Vertebral artery flow is antegrade. Significant calcific plaque bilateral bifurcations, however, velocities are not significantly increased.   TTE - Left ventricle: The cavity size was  normal. Wall thickness was   increased in a pattern of mild LVH. Systolic function was normal.   The estimated ejection fraction was in the range of 60% to 65%.   Doppler parameters are consistent with both elevated ventricular   end-diastolic filling pressure and elevated left atrial filling   pressure. - Aortic valve: There was mild stenosis. Valve area (VTI): 1.88   cm^2. Valve area (Vmax): 1.82 cm^2. Valve area (Vmean): 1.59   cm^2. - Mitral valve: Calcified annulus. - Atrial septum: No defect or patent foramen ovale was identified.  Ct Head Wo Contrast  Result Date: 03/30/2018 CLINICAL DATA:  77 y/o F; follow-up for intracranial hemorrhage, intraventricular hemorrhage, and hydrocephalus. EXAM: CT HEAD WITHOUT CONTRAST TECHNIQUE: Contiguous axial images were obtained  from the base of the skull through the vertex without intravenous contrast. COMPARISON:  03/28/2018 and 03/27/2018 CT head. FINDINGS: Brain: Stable volume acute hemorrhage within the left medial cerebellar hemisphere, ventricular system, and subarachnoid spaces of the sylvian fissures. There is some interval redistribution, for example decreased hemorrhage within the frontal horns of lateral ventricles, and increased pooling in the occipital horns. No new acute intracranial hemorrhage, stroke, or mass effect. Right frontal approach ventriculostomy catheter is stable in position traversing the frontal horn of right lateral ventricle. Stable ventricle size. Vascular: Calcific atherosclerosis of carotid siphons and vertebral arteries. Skull: Postsurgical changes related to a right frontal approach ventriculostomy catheter with decreased edema in the scalp. Sinuses/Orbits: Intubated patient. Maxillary sinus mucous retention cysts. Normal aeration of mastoid air cells. Orbits are unremarkable. Other: None. IMPRESSION: Stable volume of intraventricular, subarachnoid, and left cerebellar hemorrhage. Stable ventricle size. No new acute intracranial abnormality. Electronically Signed   By: Kristine Garbe M.D.   On: 03/30/2018 05:12    Ct Head Wo Contrast  Result Date: 04/01/2018 CLINICAL DATA:  77 y/o  F; follow-up of intracranial hemorrhage. EXAM: CT HEAD WITHOUT CONTRAST TECHNIQUE: Contiguous axial images were obtained from the base of the skull through the vertex without intravenous contrast. COMPARISON:  03/30/2018 CT head.  09/24/2014 CT head. FINDINGS: Brain: Partial interval dispersion of intraventricular, subarachnoid, and left cerebellar brain parenchymal hemorrhage. No new acute intracranial hemorrhage, stroke, or focal mass effect. Decreased size of the lateral and third ventricles, ventricle size now similar to 2016 CT head. Stable position of right frontal approach ventriculostomy catheter with  tip in the right lateral ventricle near the foramen of Monro. Vascular: Calcific atherosclerosis of carotid siphons and vertebral arteries. No hyperdense vessel identified. Skull: Right frontal burr hole postsurgical changes. Torus palatini and maxillaris. Sinuses/Orbits: Endotracheal and enteric tubes. Left maxillary sinus small mucous retention cyst. Visualized paranasal sinuses and mastoid air cells are otherwise normally aerated. Orbits are unremarkable. Other: None. IMPRESSION: 1. Partial interval dispersion of intraventricular, subarachnoid, and left cerebellar brain parenchymal hemorrhage. 2. Resolution of hydrocephalus. Stable position of right frontal approach ventriculostomy catheter. 3. No new acute intracranial process. Electronically Signed   By: Kristine Garbe M.D.   On: 04/01/2018 05:26   Dg Chest Port 1 View  Result Date: 04/01/2018 CLINICAL DATA:  Ventilator support EXAM: PORTABLE CHEST 1 VIEW COMPARISON:  03/31/2018 FINDINGS: Endotracheal tube tip is 2.5 cm above the carina. Nasogastric tube enters the stomach. Atelectasis in both lower lobes appears similar. Upper lungs remain clear. IMPRESSION: Endotracheal tube and nasogastric tube unchanged in well-positioned. Persistent atelectasis in both lower lobes. Electronically Signed   By: Nelson Chimes M.D.   On: 04/01/2018 09:08   Ct Head Wo Contrast  Result  Date: 04/02/2018 CLINICAL DATA:  Postop unresponsiveness. EXAM: CT HEAD WITHOUT CONTRAST TECHNIQUE: Contiguous axial images were obtained from the base of the skull through the vertex without intravenous contrast. COMPARISON:  Earlier today FINDINGS: Brain: Recurrent lateral and third ventricular hydrocephalus with ballooning of the horns. Clot is newly seen along the EVD catheter, which is presumably dysfunctional. There is also increased edema around the catheter which is likely tracking CSF. No rebleeding is seen. There is stable left cerebellar, intraventricular, and  subarachnoid clot. Negative for infarct. Vascular: Negative Skull: Unremarkable burr hole for EVD.  Right parietal osteoma. Sinuses/Orbits: Negative Other: During reading, case discussed with RN Amber Rogers and findings were expected. She is currently calling Dr Kathyrn Sheriff for orders. IMPRESSION: 1. Recurrent lateral and third ventricular hydrocephalus. Newly seen clot along the EVD, presumed cause of catheter dysfunction. 2. No interval bleeding. 3. Increased low-density along the EVD, likely tracking CSF. 4. No herniation or infarct. Electronically Signed   By: Monte Fantasia M.D.   On: 04/02/2018 13:14     PHYSICAL EXAM Elderly lady who is intubated and sedated. She has a right frontal ventriculostomy catheter. . Afebrile. Head is nontraumatic. Neck is supple without bruit.    Cardiac exam no murmur or gallop. Lungs are clear to auscultation. Distal pulses are well felt. Neurological Exam :  Intubated, awake and alert  does   follow commands well on right side and gaze. Pupils are small 2 mm pinpoint and reactive to light. Eyes right gaze preference but able to look to the left and midline,  . Blinking to visual threat on the right but not on the left. he is on CPAP and tolerating well. She has a cough and gag. Motor system exam she is able to withdraw all 4 extremities with painful stimuli but does not follow commands, able to against gravity RUE 3+/5 and localizing to pain with RUE. RLE 3/5 on pain, but LUE 1/5 and LLEs 2+/5 on pain stimulation. Deep tendon reflexes are 1+ symmetric, but babinski positive bilaterally. Sensation, coordination and gait not tested.    ASSESSMENT/PLAN Amber Rogers is a 77 y.o. female with history of COPD, CHF, HTN, HLD, CKD III, CAD s/p stent presenting with sudden onset nausea, vomiting and dizziness.   ICH:  left cerebellar ICH w/ IVH and mild hydrocephalus, hemorrhage likely secondary to hypertension  Worsening mental status and hydrocephalus on CT, NS consulted  (Nundkumar) with EVD placed 03/27/2018  CT head 2 cm left cerebellar hemorrhage with fourth ventricular extension, extending into the third/lateral ventricles and into the dorsal cervical canal.  Mild hydrocephalus lateral ventricles.    CTA head no vascular malformation or abnormal enhancement.  Stable cerebellar and IVH.  No LVO.  Intracranial atherosclerosis.  Repeat CT x 3 - Stable volume of intracranial hemorrhage and hydrocephalus  CUS - Significant calcific plaque bilateral bifurcations, however, velocities are not significantly increased.  2D Echo EF 60-65%  UDS neg  LDL 66  HgbA1c 5.5  Heparin subq for VTE prophylaxis  NPO on TF @ 50cc/h  aspirin 81 mg daily and Brilinta 90 mg twice daily prior to admission, now on no antithrombotics  Therapy recommendations:  Pending  Disposition:  pending   Encephalopathy   Depressed mental status even off sedation, however, following commands on 04/01/18 pm  Could be due to brainstem irritation with 4th IVH vs. Sleep wake cycle disturbance  AKI on CKD - Cre 1.36-1.33-1.35-1.43->1.57->1.25->1.12->1.11  Leukocytosis - WBC 14.0->16.2->24.8->19.2->15.3->12.4  fever Tmax 101.1->102.4->100.5->afebrile->102->100.4  CXR -  bilateral basilar atelectasis  UA WBC 6-10  CSF WBC 46, RBC 8000, protein 84, glucose 73, culture pending  On  cefepime for empirical antibiotics.   Obstructive hydrocephalus s/p R frontal EVD   EVD placed 8/22, replaced 8/28  CT showed recurrent hydro prior to EVD replacement  NSG on board  Currently EVD patent  Carotid stenosis   CUS 08/2017 R ICA stenosis 50-69%, L ICA < 50%  CUS repeat showed b/l ICA 1-39% stenosis but significant calcified plaque at carotid bifurcation bilaterally.   Acute Hypoxic Respiratory failure - extubation and re-intubation  Intubated in the ED with neuro decline   off sedation   Still depressed mental status - however intermittent lucid periods in the afternoon and  following commands.   CCM on board  Extubated and re-intubated 04/03/18  Hypertensive Emergency  BP stable off cardene  Home Meds: Norvasc 10, resumed  increase labetalol to 200mg  tid  SBP goal < 160  Hyperlipidemia  Home meds:  ?? Lipitor 80   LDL 66  Consider to resume statin on discharge  Other Stroke Risk Factors  Advanced age  Former Cigarette smoker, quit 2 years ago  Coronary artery disease s/p stent - on ASA and brilinta PTA  Chronic Congestive heart failure  Other Active Problems  Hx vertigo   COPD - on home prednisone per tube  Leukocytosis - 14.9 ->14.0->14.3->16.2->24.8->19.2->15.3->12.4->16.7  CKD stage III - Creatinine - 1.35->1.43->1.57->1.25->1.12->1.11->1.16  Hospital day # 11 Plan continue ventilatory wean and strict blood pressure control and  change goal to systolic below 035. Continue to wean off ventriculostomy as per neurosurgery Hopefully extubate over the next few days if tolerated. Long discussion with CCM NP and Dr. Kathyrn Sheriff: at the bedside and answered questions.Plan repeat CT scan of the head without contrast tomorrow morning and increase ventriculostomy pop off to 20 cm today This patient is critically ill due to cerebellar ICH with IVH and hydrocephalus, respiratory failure, hypertensive emergency and at significant risk of neurological worsening, death form recurrent bleeding, hydrocephalus, increased ICP, cerebral edema and brain death, seizure. This patient's care requires constant monitoring of vital signs, hemodynamics, respiratory and cardiac monitoring, review of multiple databases, neurological assessment, discussion with family, other specialists and medical decision making of high complexity. I spent 32 minutes of neurocritical care time in the care of this patient. I had long discussion with daughter at bedside, updated pt current condition, treatment plan and potential prognosis. She expressed understanding and appreciation. I  also discussed with PCCM PA-c, Dr Vertell Limber and answered questions.    Antony Contras, MD Stroke Neurology 04/07/2018 12:51 PM    To contact Stroke Continuity provider, please refer to [email or web address] After hours, contact General Neurology

## 2018-04-07 NOTE — Progress Notes (Signed)
PT Cancellation Note  Patient Details Name: Amber Rogers MRN: [id number] DOB: 1941/05/19   Cancelled Treatment:    Reason Eval/Treat Not Completed: Patient not medically ready   Duncan Dull 04/07/2018, 11:59 AM Alben Deeds, PT DPT  Board Certified Neurologic Specialist [phone]

## 2018-04-07 NOTE — Progress Notes (Signed)
  NEUROSURGERY PROGRESS NOTE   No issues overnight.   EXAM:  BP (!) 159/64   Pulse (!) 57   Temp 100.3 F (37.9 C) (Axillary)   Resp 17   Ht 5\' 6"  (1.676 m)   Wt 67.2 kg   SpO2 100%   BMI 23.91 kg/m   Arouses easily Breathing over vent Follows commands, Moves all extremities, EVD in place, functional, open at 15.  IMPRESSION:  77 y.o. female with pos fossa hemorrhage and HCP. VDRF  PLAN: - Will raise EVD to 48mmHg today, cont to monitor neurologic exam. Plan on [facility] tomorrow am as baseline, then will clamp.

## 2018-04-07 NOTE — Progress Notes (Signed)
PULMONARY / CRITICAL CARE MEDICINE   Name: Amber Rogers MRN: [id number] DOB: 1941-07-27    ADMISSION DATE:  03/27/2018 CONSULTATION DATE:  03/27/2018  REFERRING MD:  Dr. Leonel Ramsay  CHIEF COMPLAINT:  ICH  BRIEF SUMMARY:  77 year old female with PMH of HTN, CKD, CHF (EF 55% and grade 2 DD in 2016), and COPD. She presented to [facility] ED 8/22 with complaints of headache, nausea, and vomiting with onset of symptoms around 1200. She was found to be hypertensive. CT scan demonstrated hematoma in the left cerebellum with early hydrocephalus. Initially no neurosurgical intervention was indicated due to her reasonable mental status and dual antiplatelet regimen, however, her condition continued to decline and she required intubation. She was then deemed a candidate for EVD placement. PCCM consulted for medical/vent/BP management.  SUBJECTIVE:   Bradycardia improved w/ decreased labetolol dose 50-60  Following commands intermittently , some agitation on occasion  B/p slightly up this am, has been under good control  Weaned well yesterday , no cuff leak  Weaning this am , good volumes and sats.   Marland Kitchen   VITAL SIGNS: BP (!) 159/64   Pulse (!) 57   Temp 100.3 F (37.9 C) (Axillary)   Resp 17   Ht _0  (1.676 m)   Wt 67.2 kg   SpO2 100%   BMI 23.91 kg/m   HEMODYNAMICS:    VENTILATOR SETTINGS: Vent Mode: PSV;CPAP FiO2 (%):  [30 %] 30 % Set Rate:  [15 bmp] 15 bmp Vt Set:  [470 mL] 470 mL PEEP:  [5 cmH20] 5 cmH20 Pressure Support:  [5 cmH20] 5 cmH20 Plateau Pressure:  [10 cmH20-15 cmH20] 10 cmH20  INTAKE / OUTPUT: I/O last 3 completed shifts: In: 3326.2 [I.V.:546.2; NG/GT:2430; IV Piggyback:350] Out: 2869 [Urine:2310; Emesis/NG output:495; Drains:64]  PHYSICAL EXAMINATION: General: Elderly female on vent   HEENT: MMM, ETT, IVC drain in place  Neuro: Opens eyes to voice follows intermittent commands moves all extremities x4 CV: S1-S2, RRR, no MRG   PULM: Clear bilaterally  decreased breath sounds in the bases   GI: Soft bowel sounds positive tube feeds   Extremities: Warm dry trace edema   Skin: Intact no edema LABS:  BMET Recent Labs  Lab 04/04/18 0248 04/05/18 0328 04/06/18 0429  NA 141 141 137  K 4.5 4.3 4.2  CL 103 104 101  CO2 _1 BUN 38* 51* 51*  CREATININE 1.16* 1.25* 1.24*  GLUCOSE 146* 135* 141*   Electrolytes Recent Labs  Lab 04/02/18 0335  04/04/18 0248 04/05/18 0328 04/06/18 0429  CALCIUM 8.3*   < > 8.6* 8.7* 8.1*  MG 1.9  --   --   --   --    < > = values in this interval not displayed.   CBC Recent Labs  Lab 04/04/18 0248 04/05/18 0328 04/06/18 0429  WBC 16.7* 19.9* 13.7*  HGB 9.1* 8.6* 8.1*  HCT 30.1* 28.1* 27.2*  PLT 400 416* 459*   Coag's No results for input(s): APTT, INR in the last 168 hours. Sepsis Markers No results for input(s): LATICACIDVEN, PROCALCITON, O2SATVEN in the last 168 hours.  ABG Recent Labs  Lab 04/03/18 2023  PHART 7.376  PCO2ART 45.5  PO2ART 300.0*    Liver Enzymes No results for input(s): AST, ALT, ALKPHOS, BILITOT, ALBUMIN in the last 168 hours.  Cardiac Enzymes No results for input(s): TROPONINI, PROBNP in the last 168 hours.  Glucose Recent Labs  Lab 04/06/18 1302 04/06/18 1609 04/06/18 2036  04/06/18 2356 04/07/18 0346 04/07/18 0819  GLUCAP 166* 105* 126* 100* 130* 132*    Imaging No results found.   STUDIES:  CT head 8/22 > 2 cm hematoma in the deep left cerebellum with fourth ventricular extension extending into the third/lateral ventricles and into the dorsal cervical canal. There is mild hydrocephalus at the lateral ventricles when compared to 2016. A deep cerebellar origin favors hypertensive etiology. CT angio head 8/22 > No vascular malformation or abnormal enhancement of the brain identified. Stable cerebellar and intraventricular hemorrhage. Patent anterior and posterior intracranial circulation. No large vessel occlusion, aneurysm, or significant  stenosis. Calcific atherosclerosis of the carotid and vertebral arteries with mild stenosis. CT Head w/o 8/27 >  Partial interval dispersion of intraventricular, subarachnoid and left cerebellar brain parenchymal hemorrhage.  Resolution of hydrocephalus.  Stable position of right frontal approach ventric catheter, no new acute process.   CULTURES: BCx2 8/26 >> NEG  Tracheal Aspirate 8/26 >> negative  CSF 8/29 >>  ANTIBIOTICS: Vanco 8/26 >> 9/2 Cefepime 8/26 >> 9/2  SIGNIFICANT EVENTS: 8/22 Admit, EVD placed.  8/26 Fever, empiric abx added  8/28 EVD replaced (clogged), ETT replaced (bit through).  Added provigil.  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 Secondary to hypertensive crisis. Initial pressures as high as 209/158.  Obstructive Hydrocephalus s/p EVD (8/22) Intermittent Agitation  P: CSF culture per NSGY  SBP goal <180 Continue am provigil, nighttime rest  Management per NSGY / Neurology  Hold home antiplatelets  Passive ROM  Wean Precedex (added 8/31) as able   Acute Hypoxic Respiratory failure: Intubated for airway protection in the setting of intracranial hemorrhage Hx Tobacco Abuse  -8/29 reintubated for stridor s/p racemic epi and decadron  9/2 weaning well  P: PRVC as rest mode  PSV during day as tolerated  Wean as able,  O2 to support sats > 90% If unable to extubate, may need to consider trach next week.  Check cxr today    COPD - without acute exacerbation P: Duoneb Q6  Brovana + Pulmicort BID   Osteoarthritis P: Cont prednisone 56m QD (chronic dose)   Chronic CHF +12 L Bal since admit  9/2 even bal  Bradycardia improved  HTN -b/p goal <180   P: Monitor in ICU  PO norvasc,  Cont Labetolol 1015mThree times a day (dose decreased 9/1) PRN hydralazine for BP goal   Hold diuresis today   Non AG Metabolic acidosis due to Hyperchloremia - resolved Hyperkalemia-resolved  P: Cont free water 250 Q8 Trend BMP / urinary output Replace electrolytes as indicated Avoid nephrotoxic agents, ensure adequate renal perfusion  Leukocytosis -wbc tr down  Fever - new 8/26-temp tr down  P: CSF culture per NSGY  Follow cultures -ngtd  Continue empiric abx for now, D8 /x -CSF cx neg , d/c abx   Anemia  P: Monitor CBC  Transfuse per ICU guidelines   Best Practice GI: PPI  DVT: Hep  Feeding: TF   Family: Daughter updated at bedside 9/1  on plan of care.    Quatavious Rossa NP-C  [facility]  [phone] 04/07/2018

## 2018-04-08 ENCOUNTER — Inpatient Hospital Stay (HOSPITAL_COMMUNITY): Payer: Medicare Other

## 2018-04-08 LAB — GLUCOSE, CAPILLARY
GLUCOSE-CAPILLARY: 130 mg/dL — AB (ref 70–99)
Glucose-Capillary: 112 mg/dL — ABNORMAL HIGH (ref 70–99)
Glucose-Capillary: 130 mg/dL — ABNORMAL HIGH (ref 70–99)
Glucose-Capillary: 113 mg/dL — ABNORMAL HIGH (ref 70–99)
Glucose-Capillary: 116 mg/dL — ABNORMAL HIGH (ref 70–99)
Glucose-Capillary: 111 mg/dL — ABNORMAL HIGH (ref 70–99)

## 2018-04-08 LAB — BASIC METABOLIC PANEL
Anion gap: 6 (ref 5–15)
BUN: 33 mg/dL — AB (ref 8–23)
CO2: 27 mmol/L (ref 22–32)
CREATININE: 0.95 mg/dL (ref 0.44–1.00)
Calcium: 8.3 mg/dL — ABNORMAL LOW (ref 8.9–10.3)
Chloride: 103 mmol/L (ref 98–111)
GFR calc Af Amer: >60 mL/min (ref >60)
GFR, EST NON AFRICAN AMERICAN: 57 mL/min — AB (ref >60)
Glucose, Bld: 125 mg/dL — ABNORMAL HIGH (ref 70–99)
POTASSIUM: 4.2 mmol/L (ref 3.5–5.1)
SODIUM: 136 mmol/L (ref 135–145)

## 2018-04-08 LAB — CBC
HCT: 26.9 % — ABNORMAL LOW (ref 36.0–46.0)
Hemoglobin: 8.3 g/dL — ABNORMAL LOW (ref 12.0–15.0)
MCH: 27.2 pg (ref 26.0–34.0)
MCHC: 30.9 g/dL (ref 30.0–36.0)
MCV: 88.2 fL (ref 78.0–100.0)
PLATELETS: 413 10*3/uL — AB (ref 150–400)
RBC: 3.05 MIL/uL — ABNORMAL LOW (ref 3.87–5.11)
RDW: 15.5 % (ref 11.5–15.5)
WBC: 16.7 10*3/uL — ABNORMAL HIGH (ref 4.0–10.5)

## 2018-04-08 LAB — ANAEROBIC CULTURE

## 2018-04-08 NOTE — Progress Notes (Signed)
STROKE TEAM PROGRESS NOTE   INTERVAL HISTORY Her  Family member is at the bedside.  Patient  is   alert and interactive today. She remains neurologically stable.. Blood pressure adequately controlled.   Ventriculostomy draining well but not much output at pop off at 20.the patient's CT scan of the head today shows stable appearance of the contracting brainstem hematoma and mild hydrocephalus which appears stable.    Vitals:   04/08/18 1100 04/08/18 1110 04/08/18 1200 04/08/18 1351  BP: (!) 173/65 (!) 173/65 126/85 (!) 160/64  Pulse: 66 65 64 66  Resp: 20 19 19 20   Temp:   99.7 F (37.6 C)   TempSrc:   Axillary   SpO2: 100% 100% 100% 98%  Weight:      Height:        CBC:  Recent Labs  Lab 04/07/18 0929 04/08/18 0432  WBC 15.3* 16.7*  HGB 8.2* 8.3*  HCT 27.0* 26.9*  MCV 87.9 88.2  PLT 430* 413*    Basic Metabolic Panel:  Recent Labs  Lab 04/02/18 0335  04/07/18 0929 04/08/18 0432  NA 138   < > 136 136  K 4.1   < > 4.3 4.2  CL 104   < > 101 103  CO2 25   < > 29 27  GLUCOSE 124*   < > 124* 125*  BUN 40*   < > 36* 33*  CREATININE 1.12*   < > 1.05* 0.95  CALCIUM 8.3*   < > 8.3* 8.3*  MG 1.9  --   --   --    < > = values in this interval not displayed.   Lipid Panel:     Component Value Date/Time   CHOL 177 03/30/2018 0235   TRIG 67 03/30/2018 2248   HDL 101 03/30/2018 0235   CHOLHDL 1.8 03/30/2018 0235   VLDL 10 03/30/2018 0235   LDLCALC 66 03/30/2018 0235   HgbA1c:  Lab Results  Component Value Date   HGBA1C 5.5 03/30/2018   Urine Drug Screen:     Component Value Date/Time   LABOPIA NONE DETECTED 03/29/2018 1110   COCAINSCRNUR NONE DETECTED 03/29/2018 1110   LABBENZ NONE DETECTED 03/29/2018 1110   AMPHETMU NONE DETECTED 03/29/2018 1110   THCU NONE DETECTED 03/29/2018 1110   LABBARB NONE DETECTED 03/29/2018 1110    Alcohol Level     Component Value Date/Time   ETH <10 03/27/2018 1354    IMAGING  Ct Angio Head W Or Wo  Contrast 03/27/2018 IMPRESSION:  1. No vascular malformation or abnormal enhancement of the brain identified.  2. Stable cerebellar and intraventricular hemorrhage.  3. Patent anterior and posterior intracranial circulation. No large vessel occlusion, aneurysm, or significant stenosis.  4. Calcific atherosclerosis of the carotid and vertebral arteries with mild stenosis.    Ct Head Wo Contrast 03/28/2018 IMPRESSION:  1. Stable volume of intracranial hemorrhage within the left cerebellar hemisphere and the ventricular system. Some redistribution of blood products to the lateral ventricle atria and sylvian fissures.  2. No new acute intracranial abnormality.  3. Interval right frontal approach ventriculostomy catheter placement. Mild decrease in hydrocephalus.    Ct Head Wo Contrast 03/27/2018 IMPRESSION:  2 cm hematoma in the deep left cerebellum with fourth ventricular extension extending into the third/lateral ventricles and into the dorsal cervical canal. There is mild hydrocephalus at the lateral ventricles when compared to 2016. A deep cerebellar origin favors hypertensive etiology.   CUS 1-39% ICA stenosis.  Vertebral artery flow  is antegrade. Significant calcific plaque bilateral bifurcations, however, velocities are not significantly increased.   TTE - Left ventricle: The cavity size was normal. Wall thickness was   increased in a pattern of mild LVH. Systolic function was normal.   The estimated ejection fraction was in the range of 60% to 65%.   Doppler parameters are consistent with both elevated ventricular   end-diastolic filling pressure and elevated left atrial filling   pressure. - Aortic valve: There was mild stenosis. Valve area (VTI): 1.88   cm^2. Valve area (Vmax): 1.82 cm^2. Valve area (Vmean): 1.59   cm^2. - Mitral valve: Calcified annulus. - Atrial septum: No defect or patent foramen ovale was identified.  Ct Head Wo Contrast  Result Date:  03/30/2018 CLINICAL DATA:  77 y/o F; follow-up for intracranial hemorrhage, intraventricular hemorrhage, and hydrocephalus. EXAM: CT HEAD WITHOUT CONTRAST TECHNIQUE: Contiguous axial images were obtained from the base of the skull through the vertex without intravenous contrast. COMPARISON:  03/28/2018 and 03/27/2018 CT head. FINDINGS: Brain: Stable volume acute hemorrhage within the left medial cerebellar hemisphere, ventricular system, and subarachnoid spaces of the sylvian fissures. There is some interval redistribution, for example decreased hemorrhage within the frontal horns of lateral ventricles, and increased pooling in the occipital horns. No new acute intracranial hemorrhage, stroke, or mass effect. Right frontal approach ventriculostomy catheter is stable in position traversing the frontal horn of right lateral ventricle. Stable ventricle size. Vascular: Calcific atherosclerosis of carotid siphons and vertebral arteries. Skull: Postsurgical changes related to a right frontal approach ventriculostomy catheter with decreased edema in the scalp. Sinuses/Orbits: Intubated patient. Maxillary sinus mucous retention cysts. Normal aeration of mastoid air cells. Orbits are unremarkable. Other: None. IMPRESSION: Stable volume of intraventricular, subarachnoid, and left cerebellar hemorrhage. Stable ventricle size. No new acute intracranial abnormality. Electronically Signed   By: Kristine Garbe M.D.   On: 03/30/2018 05:12    Ct Head Wo Contrast  Result Date: 04/01/2018 CLINICAL DATA:  77 y/o  F; follow-up of intracranial hemorrhage. EXAM: CT HEAD WITHOUT CONTRAST TECHNIQUE: Contiguous axial images were obtained from the base of the skull through the vertex without intravenous contrast. COMPARISON:  03/30/2018 CT head.  09/24/2014 CT head. FINDINGS: Brain: Partial interval dispersion of intraventricular, subarachnoid, and left cerebellar brain parenchymal hemorrhage. No new acute intracranial  hemorrhage, stroke, or focal mass effect. Decreased size of the lateral and third ventricles, ventricle size now similar to 2016 CT head. Stable position of right frontal approach ventriculostomy catheter with tip in the right lateral ventricle near the foramen of Monro. Vascular: Calcific atherosclerosis of carotid siphons and vertebral arteries. No hyperdense vessel identified. Skull: Right frontal burr hole postsurgical changes. Torus palatini and maxillaris. Sinuses/Orbits: Endotracheal and enteric tubes. Left maxillary sinus small mucous retention cyst. Visualized paranasal sinuses and mastoid air cells are otherwise normally aerated. Orbits are unremarkable. Other: None. IMPRESSION: 1. Partial interval dispersion of intraventricular, subarachnoid, and left cerebellar brain parenchymal hemorrhage. 2. Resolution of hydrocephalus. Stable position of right frontal approach ventriculostomy catheter. 3. No new acute intracranial process. Electronically Signed   By: Kristine Garbe M.D.   On: 04/01/2018 05:26   Dg Chest Port 1 View  Result Date: 04/01/2018 CLINICAL DATA:  Ventilator support EXAM: PORTABLE CHEST 1 VIEW COMPARISON:  03/31/2018 FINDINGS: Endotracheal tube tip is 2.5 cm above the carina. Nasogastric tube enters the stomach. Atelectasis in both lower lobes appears similar. Upper lungs remain clear. IMPRESSION: Endotracheal tube and nasogastric tube unchanged in well-positioned. Persistent atelectasis in both lower  lobes. Electronically Signed   By: Nelson Chimes M.D.   On: 04/01/2018 09:08   Ct Head Wo Contrast  Result Date: 04/02/2018 CLINICAL DATA:  Postop unresponsiveness. EXAM: CT HEAD WITHOUT CONTRAST TECHNIQUE: Contiguous axial images were obtained from the base of the skull through the vertex without intravenous contrast. COMPARISON:  Earlier today FINDINGS: Brain: Recurrent lateral and third ventricular hydrocephalus with ballooning of the horns. Clot is newly seen along the EVD  catheter, which is presumably dysfunctional. There is also increased edema around the catheter which is likely tracking CSF. No rebleeding is seen. There is stable left cerebellar, intraventricular, and subarachnoid clot. Negative for infarct. Vascular: Negative Skull: Unremarkable burr hole for EVD.  Right parietal osteoma. Sinuses/Orbits: Negative Other: During reading, case discussed with RN Roselyn Reef and findings were expected. She is currently calling Dr Kathyrn Sheriff for orders. IMPRESSION: 1. Recurrent lateral and third ventricular hydrocephalus. Newly seen clot along the EVD, presumed cause of catheter dysfunction. 2. No interval bleeding. 3. Increased low-density along the EVD, likely tracking CSF. 4. No herniation or infarct. Electronically Signed   By: Monte Fantasia M.D.   On: 04/02/2018 13:14     PHYSICAL EXAM Elderly lady who is intubated  . She has a right frontal ventriculostomy catheter. . Afebrile. Head is nontraumatic. Neck is supple without bruit.    Cardiac exam no murmur or gallop. Lungs are clear to auscultation. Distal pulses are well felt. Neurological Exam :  Intubated, awake and alert  does  follow commands well on right side and gaze. Pupils are small 2 mm pinpoint and reactive to light. Eyes right gaze preference but able to look to the left and midline,  . Blinking to visual threat on the right but not on the left. he is on CPAP and tolerating well. She has a cough and gag. Motor system exam she is able to withdraw all 4 extremities with painful stimuli but does not follow commands, able to against gravity RUE 3+/5 and localizing to pain with RUE. RLE 3/5 on pain, but LUE 1/5 and LLEs 2+/5 on pain stimulation. Deep tendon reflexes are 1+ symmetric, but babinski positive bilaterally. Sensation, coordination and gait not tested.    ASSESSMENT/PLAN Ms. DEALVA LAFOY is a 77 y.o. female with history of COPD, CHF, HTN, HLD, CKD III, CAD s/p stent presenting with sudden onset nausea,  vomiting and dizziness.   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  Mild hydrocephalus lateral ventricles.    CTA head no vascular malformation or abnormal enhancement.  Stable cerebellar and IVH.  No LVO.  Intracranial atherosclerosis.  Repeat CT x 3 - Stable volume of intracranial hemorrhage and hydrocephalus  CUS - Significant calcific plaque bilateral bifurcations, however, velocities are not significantly increased.  2D Echo EF 60-65%  UDS neg  LDL 66  HgbA1c 5.5  Heparin subq for VTE prophylaxis  NPO on TF @ 50cc/h  aspirin 81 mg daily and Brilinta 90 mg twice daily prior to admission, now on no antithrombotics  Therapy recommendations:  Pending  Disposition:  pending   Encephalopathy   Depressed mental status even off sedation, however, following commands on 04/01/18 pm  Could be due to brainstem irritation with 4th IVH vs. Sleep  wake cycle disturbance  AKI on CKD - Cre 1.36-1.33-1.35-1.43->1.57->1.25->1.12->1.11  Leukocytosis - WBC 14.0->16.2->24.8->19.2->15.3->12.4  fever Tmax 101.1->102.4->100.5->afebrile->102->100.4  CXR - bilateral basilar atelectasis  UA WBC 6-10  CSF WBC 46, RBC 8000, protein 84, glucose 73, culture pending  On  cefepime for empirical antibiotics.   Obstructive hydrocephalus s/p R frontal EVD   EVD placed 8/22, replaced 8/28  CT showed recurrent hydro prior to EVD replacement  NSG on board  Currently EVD patent  Carotid stenosis   CUS 08/2017 R ICA stenosis 50-69%, L ICA < 50%  CUS repeat showed b/l ICA 1-39% stenosis but significant calcified plaque at carotid bifurcation bilaterally.   Acute Hypoxic  Respiratory failure - extubation and re-intubation  Intubated in the ED with neuro decline   off sedation   Still depressed mental status - however intermittent lucid periods in the afternoon and following commands.   CCM on board  Extubated and re-intubated 04/03/18  Hypertensive Emergency  BP stable off cardene  Home Meds: Norvasc 10, resumed  increase labetalol to 200mg  tid  SBP goal < 160  Hyperlipidemia  Home meds:  ?? Lipitor 80   LDL 66  Consider to resume statin on discharge  Other Stroke Risk Factors  Advanced age  Former Cigarette smoker, quit 2 years ago  Coronary artery disease s/p stent - on ASA and brilinta PTA  Chronic Congestive heart failure  Other Active Problems  Hx vertigo   COPD - on home prednisone per tube  Leukocytosis - 14.9 ->14.0->14.3->16.2->24.8->19.2->15.3->12.4->16.7  CKD stage III - Creatinine - 1.35->1.43->1.57->1.25->1.12->1.11->1.16  Hospital day # 12 Plan continue ventilatory wean and strict blood pressure control   goal   systolic below 732. Continue to wean off ventriculostomy as per neurosurgery Hopefully extubate over the next few days if tolerated. Long discussion with Dr Lynetta Mare and Dr. Kathyrn Sheriff: at the bedside and answered questions This patient is critically ill due to cerebellar ICH with IVH and hydrocephalus, respiratory failure, hypertensive emergency and at significant risk of neurological worsening, death form recurrent bleeding, hydrocephalus, increased ICP, cerebral edema and brain death, seizure. This patient's care requires constant monitoring of vital signs, hemodynamics, respiratory and cardiac monitoring, review of multiple databases, neurological assessment, discussion with family, other specialists and medical decision making of high complexity. I spent 32 minutes of neurocritical care time in the care of this patient. I had long discussion with daughter at bedside, updated pt current condition, treatment  plan and potential prognosis. Antony Contras, MD Stroke Neurology 04/08/2018 1:57 PM    To contact Stroke Continuity provider, please refer to [email or web address] After hours, contact General Neurology

## 2018-04-08 NOTE — Progress Notes (Signed)
Rehab Admissions Coordinator Note:  Per PT recommendation, Patient was screened by Jhonnie Garner for appropriateness for an Inpatient Acute Rehab Consult.  Noted pt remains on the vent at this time. AC will follow along and will request IP rehab consult order once pt progressing medically.    Jhonnie Garner 04/08/2018, 1:32 PM  I can be reached at [phone].

## 2018-04-08 NOTE — Progress Notes (Signed)
Patient transported from 4N30 to CT and back without any complications.

## 2018-04-08 NOTE — Evaluation (Signed)
Physical Therapy Evaluation Patient Details Name: Amber Rogers MRN: [id number] DOB: 18-Jan-1941 Today's Date: 04/08/2018   History of Present Illness  77 year old female with PMH of HTN, CKD, CHF (EF 55% and grade 2 DD in 2016), and COPD. She presented to [facility] ED 8/22 with complaints of headache, nausea, and vomiting with onset of symptoms around 1200. She was found to be hypertensive. CT scan demonstrated hematoma in the left cerebellum with early hydrocephalus. Initially no neurosurgical intervention was indicated due to her reasonable mental status and dual antiplatelet regimen, however, her condition continued to decline and she required intubation. She was then deemed a candidate for EVD placement.  Clinical Impression  Pt admitted with/for left cerebellar hemorrhage s/p EVD.  On evaluation, pt was more resistant than she was able to give directed assist to task.  At this time, she needed total assist..  Pt currently limited functionally due to the problems listed. ( See problems list.)   Pt will benefit from PT to maximize function and safety in order to get ready for next venue listed below.     Follow Up Recommendations CIR;Other (comment)(TBA further after a more extensive mobility assessment)    Equipment Recommendations  Other (comment)(TBA)    Recommendations for Other Services Rehab consult     Precautions / Restrictions Precautions Precautions: Fall;Other (comment)(restraints non violent)      Mobility  Bed Mobility Overal bed mobility: Needs Assistance Bed Mobility: Rolling;Sidelying to Sit;Sit to Supine Rolling: Max assist Sidelying to sit: Total assist   Sit to supine: Total assist;+2 for physical assistance   General bed mobility comments: pt more resistant than with directed assist.  Movements are uncoordinated and generally resistant  Transfers                 General transfer comment: unable today  Ambulation/Gait                 Stairs            Wheelchair Mobility    Modified Rankin (Stroke Patients Only) Modified Rankin (Stroke Patients Only) Pre-Morbid Rankin Score: No symptoms Modified Rankin: Severe disability     Balance Overall balance assessment: Needs assistance Sitting-balance support: Feet supported;Feet unsupported;No upper extremity supported;Bilateral upper extremity supported Sitting balance-Leahy Scale: Zero Sitting balance - Comments: Heavy resistance posteriorly Postural control: Posterior lean                                   Pertinent Vitals/Pain Pain Assessment: Faces Faces Pain Scale: Hurts a little bit Pain Location: ?general LE during MMT Pain Descriptors / Indicators: Grimacing;Guarding Pain Intervention(s): Monitored during session    Home Living Family/patient expects to be discharged to:: Private residence Living Arrangements: Spouse/significant other                    Prior Function                 Hand Dominance        Extremity/Trunk Assessment   Upper Extremity Assessment Upper Extremity Assessment: Defer to OT evaluation    Lower Extremity Assessment Lower Extremity Assessment: RLE deficits/detail;LLE deficits/detail RLE Deficits / Details: Noted small spontaneous movements.  Pt resistant to all MMT in both flexion and extension at >3+/5. RLE Coordination: decreased fine motor LLE Deficits / Details: noted spontaneous movement.   Pt resistant to MMT in both flexion and  extension LLE Coordination: decreased fine motor       Communication      Cognition Arousal/Alertness: Awake/alert;Lethargic Behavior During Therapy: Restless;WFL for tasks assessed/performed Overall Cognitive Status: Difficult to assess                                        General Comments General comments (skin integrity, edema, etc.): vss    Exercises     Assessment/Plan    PT Assessment Patient needs continued PT  services  PT Problem List Decreased activity tolerance;Decreased balance;Decreased mobility;Decreased coordination       PT Treatment Interventions Functional mobility training;Therapeutic activities;Balance training;Neuromuscular re-education;Patient/family education;Gait training;DME instruction    PT Goals (Current goals can be found in the Care Plan section)  Acute Rehab PT Goals PT Goal Formulation: Patient unable to participate in goal setting Time For Goal Achievement: 04/22/18 Potential to Achieve Goals: Fair    Frequency Min 3X/week   Barriers to discharge        Co-evaluation               AM-PAC PT "6 Clicks" Daily Activity  Outcome Measure Difficulty turning over in bed (including adjusting bedclothes, sheets and blankets)?: Unable Difficulty moving from lying on back to sitting on the side of the bed? : Unable Difficulty sitting down on and standing up from a chair with arms (e.g., wheelchair, bedside commode, etc,.)?: Unable Help needed moving to and from a bed to chair (including a wheelchair)?: Total Help needed walking in hospital room?: Total Help needed climbing 3-5 steps with a railing? : Total 6 Click Score: 6    End of Session Equipment Utilized During Treatment: Other (comment)(vent) Activity Tolerance: Patient tolerated treatment well;Other (comment) Patient left: in bed;with call bell/phone within reach;with bed alarm set;with nursing/sitter in room   PT Visit Diagnosis: Other abnormalities of gait and mobility (R26.89);Other symptoms and signs involving the nervous system (R29.898)    Time: 2423-5361 PT Time Calculation (min) (ACUTE ONLY): 20 min   Charges:   PT Evaluation $PT Eval Moderate Complexity: 1 Mod          04/08/2018  [name], PT [phone]  (pager)  Tessie Fass Cambryn Charters 04/08/2018, 1:16 PM

## 2018-04-08 NOTE — Progress Notes (Signed)
PULMONARY / CRITICAL CARE MEDICINE   Name: Amber Rogers MRN: [id number] DOB: 09/09/1940    ADMISSION DATE:  03/27/2018 CONSULTATION DATE:  03/27/2018  REFERRING MD:  Dr. Leonel Ramsay  CHIEF COMPLAINT:  ICH  BRIEF SUMMARY:  77 year old female with PMH of HTN, CKD, CHF (EF 55% and grade 2 DD in 2016), and COPD. She presented to [facility] ED 8/22 with complaints of headache, nausea, and vomiting with onset of symptoms around 1200. She was found to be hypertensive. CT scan demonstrated hematoma in the left cerebellum with early hydrocephalus. Initially no neurosurgical intervention was indicated due to her reasonable mental status and dual antiplatelet regimen, however, her condition continued to decline and she required intubation. She was then deemed a candidate for EVD placement. PCCM consulted for medical/vent/BP management.  SUBJECTIVE:  Tolerating PSV at 5/5, following some basic commands intermittently.  Per RN, was more alert earlier this AM.  VITAL SIGNS: BP (!) 141/57   Pulse (!) 52   Temp 97.8 F (36.6 C) (Axillary)   Resp 12   Ht 5\' 6"  (1.676 m)   Wt 66.8 kg   SpO2 100%   BMI 23.77 kg/m   HEMODYNAMICS:    VENTILATOR SETTINGS: Vent Mode: PRVC FiO2 (%):  [30 %] 30 % Set Rate:  [15 bmp] 15 bmp Vt Set:  [470 mL] 470 mL PEEP:  [5 cmH20] 5 cmH20 Pressure Support:  [5 cmH20-8 cmH20] 8 cmH20 Plateau Pressure:  [13 cmH20-16 cmH20] 13 cmH20  INTAKE / OUTPUT: I/O last 3 completed shifts: In: 2568.5 [I.V.:711; NG/GT:1857.5] Out: 9233 [Urine:3815; Drains:17]  PHYSICAL EXAMINATION: General: Elderly female on vent, in NAD HEENT: MMM, ETT, IVC drain in place  Neuro: Opens eyes to voice follows intermittent commands moves all extremities CV: RRR, no MRG   PULM:  Resps even and unlabored.  Coarse bilaterally GI: BS x 4, S/NT/ND   Extremities: Warm dry trace edema   Skin: Intact no edema  LABS:  BMET Recent Labs  Lab 04/06/18 0429 04/07/18 0929 04/08/18 0432  NA 137  136 136  K 4.2 4.3 4.2  CL 101 101 103  CO2 28 29 27   BUN 51* 36* 33*  CREATININE 1.24* 1.05* 0.95  GLUCOSE 141* 124* 125*   Electrolytes Recent Labs  Lab 04/02/18 0335  04/06/18 0429 04/07/18 0929 04/08/18 0432  CALCIUM 8.3*   < > 8.1* 8.3* 8.3*  MG 1.9  --   --   --   --    < > = values in this interval not displayed.   CBC Recent Labs  Lab 04/06/18 0429 04/07/18 0929 04/08/18 0432  WBC 13.7* 15.3* 16.7*  HGB 8.1* 8.2* 8.3*  HCT 27.2* 27.0* 26.9*  PLT 459* 430* 413*   Coag's No results for input(s): APTT, INR in the last 168 hours. Sepsis Markers No results for input(s): LATICACIDVEN, PROCALCITON, O2SATVEN in the last 168 hours.  ABG Recent Labs  Lab 04/03/18 2023  PHART 7.376  PCO2ART 45.5  PO2ART 300.0*    Liver Enzymes No results for input(s): AST, ALT, ALKPHOS, BILITOT, ALBUMIN in the last 168 hours.  Cardiac Enzymes No results for input(s): TROPONINI, PROBNP in the last 168 hours.  Glucose Recent Labs  Lab 04/07/18 1140 04/07/18 1510 04/07/18 1954 04/07/18 2348 04/08/18 0337 04/08/18 0814  GLUCAP 133* 127* 96 135* 112* 130*    Imaging Ct Head Wo Contrast  Result Date: 04/08/2018 CLINICAL DATA:  Follow up intracranial hemorrhage. Decreased ventriculostomy output. EXAM: CT HEAD  WITHOUT CONTRAST TECHNIQUE: Contiguous axial images were obtained from the base of the skull through the vertex without intravenous contrast. COMPARISON:  CT HEAD April 02, 2018 FINDINGS: BRAIN: Stable ventriculomegaly with blood products within the lateral ventricles, layering in the occipital horns and, blood products and fourth ventricle. Similar blood products along the RIGHT frontal ventriculostomy catheter with surrounding vasogenic edema. Patchy white matter supratentorial hypodensities close of aforementioned abnormality compatible with mild chronic small vessel ischemic changes. Minimal likely redistributed subarachnoid hemorrhage. No acute large vascular territory  infarct. Evolving LEFT cerebellar hematoma with surrounding low-density vasogenic edema and effacement of the fourth ventricle. Old small RIGHT cerebellar infarct. VASCULAR: Moderate calcific atherosclerosis of the carotid siphons. SKULL: No skull fracture. RIGHT frontal oral hold with mild scalp soft tissue swelling and skin staples. No significant scalp soft tissue swelling. SINUSES/ORBITS: Bilateral mastoid effusions. LEFT maxillary mucosal retention cysts with trace paranasal sinus mucosal thickening. Included ocular globes and orbital contents are non-suspicious. OTHER: Endotracheal tube in place.  Multiple dental caries. IMPRESSION: 1. Evolving LEFT cerebellar hematoma with intraventricular extension. 2. Stable mild hydrocephalus with RIGHT frontal ventriculostomy catheter, similar blood products along catheter track. 3. Small volume redistributed subarachnoid hemorrhage. Electronically Signed   By: Elon Alas M.D.   On: 04/08/2018 06:00   Dg Chest Port 1 View  Result Date: 04/08/2018 CLINICAL DATA:  Respiratory failure /ETT,HX COPD,CHF EXAM: PORTABLE CHEST 1 VIEW COMPARISON:  04/07/2018 FINDINGS: Endotracheal tube tip 2.8 cm above the carina. Right PICC line tip: Lower SVC. Nasogastric tube enters the stomach. The patient is rotated to the right on today's radiograph, reducing diagnostic sensitivity and specificity. Continued bandlike opacity at the right lung base with some adjacent volume loss, favoring atelectasis. Indistinct retrocardiac airspace opacity, stable, with some obscuration of the left hemidiaphragm. Borderline enlargement of the cardiopericardial silhouette. No edema. IMPRESSION: 1. Stable bibasilar airspace opacities. The right basilar airspace opacity favors atelectasis. The left lower lobe airspace opacity is nonspecific. 2. Borderline enlargement of the cardiopericardial silhouette. Electronically Signed   By: Van Clines M.D.   On: 04/08/2018 07:16   Dg Chest Port 1  View  Result Date: 04/07/2018 CLINICAL DATA:  77 year old female status post endotracheal tube revision EXAM: PORTABLE CHEST 1 VIEW COMPARISON:  Prior radiograph earlier this morning at 9:38 a.m. FINDINGS: The tip of the endotracheal tube is now 2.6 cm above the carina. Unchanged position of right PICC with the tip overlying the superior cavoatrial junction. The gastric tube is also unchanged, the tip lies below the field of view presumably within the stomach. Stable cardiomegaly and left greater than right base basilar atelectasis. No acute osseous abnormality. IMPRESSION: Successful repositioning of endotracheal tube. The endotracheal tube tip is now 2.6 cm above the carina. Electronically Signed   By: Jacqulynn Cadet M.D.   On: 04/07/2018 11:36   Dg Chest Port 1 View  Result Date: 04/07/2018 CLINICAL DATA:  77 year old female with respiratory failure, intubated EXAM: PORTABLE CHEST 1 VIEW COMPARISON:  Prior chest x-ray 04/06/2018 FINDINGS: The patient remains intubated. The tip of the endotracheal tube is 1.4 cm above the carina. Right upper extremity PICC with the tip overlying the cavoatrial junction. A gastric tube is present. The tip is not identified but lies below the diaphragm, presumably within the stomach. Stable cardiomegaly. Atherosclerotic calcifications in the transverse aorta. Slightly improved inspiratory volumes with persistent but decreasing left greater than right basilar atelectasis. No acute osseous abnormality. IMPRESSION: 1. Stable and satisfactory support apparatus. 2. Improved inspiratory volumes with decreasing  bibasilar atelectasis. 3. Stable cardiomegaly. 4.  Aortic Atherosclerosis (ICD10-170.0). Electronically Signed   By: Jacqulynn Cadet M.D.   On: 04/07/2018 10:29     STUDIES:  CT head 8/22 > 2 cm hematoma in the deep left cerebellum with fourth ventricular extension extending into the third/lateral ventricles and into the dorsal cervical canal. There is mild  hydrocephalus at the lateral ventricles when compared to 2016. A deep cerebellar origin favors hypertensive etiology. CT angio head 8/22 > No vascular malformation or abnormal enhancement of the brain identified. Stable cerebellar and intraventricular hemorrhage. Patent anterior and posterior intracranial circulation. No large vessel occlusion, aneurysm, or significant stenosis. Calcific atherosclerosis of the carotid and vertebral arteries with mild stenosis. CT Head w/o 8/27 >  Partial interval dispersion of intraventricular, subarachnoid and left cerebellar brain parenchymal hemorrhage.  Resolution of hydrocephalus.  Stable position of right frontal approach ventric catheter, no new acute process.  CT head 9/3 > evolving left cerebellar hematoma with intraventricular extension, stable mild hydrocephalus with right frontal ventriculostomy, small volume redistributed SAH  CULTURES: BCx2 8/26 >> NEG  Tracheal Aspirate 8/26 >> negative  CSF 8/29 >> negative  ANTIBIOTICS: Vanco 8/26 >> 9/2 Cefepime 8/26 >> 9/2  SIGNIFICANT EVENTS: 8/22 Admit, EVD placed.  8/26 Fever, empiric abx added  8/28 EVD replaced (clogged), ETT replaced (bit through).  Added provigil.  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 Secondary to hypertensive crisis. Initial pressures as high as 209/158.  Obstructive Hydrocephalus s/p EVD (8/22) Intermittent Agitation  P: SBP goal <180 Management per NSGY / Neurology  Hold home antiplatelets  Wean Precedex (added 8/31) as able  RASS goal: 0  Intubated for airway protection in the setting of intracranial hemorrhage - has failed extubation x 1 due to stridor. Hx Tobacco Abuse  P: Continue PSV as able, will push for extubation trial if mental status remains good this AM If unable to extubate, may  need to consider trach next week - continue to push for extubation Continue bronchial hygiene Follow CXR   COPD - without acute exacerbation P: Duoneb Q6  Brovana + Pulmicort BID   Osteoarthritis P: Cont prednisone 5mg  QD (chronic dose)   Hx Chronic CHF, HTN Bradycardia improved   P: Monitor in ICU  Cont Labetolol, amlodipine PRN hydralazine for SBP goal < 180 Hold diuresis today   Non AG Metabolic acidosis due to Hyperchloremia - resolved Hyperkalemia - resolved  P: Cont free water 250 Q8 Trend BMP / urinary output Replace electrolytes as indicated  Avoid nephrotoxic agents, ensure adequate renal perfusion  Leukocytosis - no indication of infection Fever - new 8/26-temp tr down.  S/p course of abx.  CSF negative. P: Monitor clinically  Anemia - no signs of bleeding P: Monitor CBC  Transfuse for Hgb < 7   Best Practice GI: PPI  DVT: Hep  Feeding: TF   Family: Daughter updated at bedside 9/1, 9/3 on plan of care.   CC time: 30 min.   Montey Hora, [facility] Pager: [phone]  or [phone] 04/08/2018, 9:09 AM

## 2018-04-09 ENCOUNTER — Inpatient Hospital Stay (HOSPITAL_COMMUNITY): Payer: Medicare Other

## 2018-04-09 LAB — BASIC METABOLIC PANEL
Anion gap: 6 (ref 5–15)
BUN: 30 mg/dL — AB (ref 8–23)
CHLORIDE: 105 mmol/L (ref 98–111)
CO2: 26 mmol/L (ref 22–32)
Calcium: 8.1 mg/dL — ABNORMAL LOW (ref 8.9–10.3)
Creatinine, Ser: 0.92 mg/dL (ref 0.44–1.00)
GFR calc Af Amer: >60 mL/min (ref >60)
GFR calc non Af Amer: 59 mL/min — ABNORMAL LOW (ref >60)
GLUCOSE: 115 mg/dL — AB (ref 70–99)
POTASSIUM: 4.3 mmol/L (ref 3.5–5.1)
Sodium: 137 mmol/L (ref 135–145)

## 2018-04-09 LAB — GLUCOSE, CAPILLARY
GLUCOSE-CAPILLARY: 123 mg/dL — AB (ref 70–99)
Glucose-Capillary: 139 mg/dL — ABNORMAL HIGH (ref 70–99)
Glucose-Capillary: 116 mg/dL — ABNORMAL HIGH (ref 70–99)
Glucose-Capillary: 114 mg/dL — ABNORMAL HIGH (ref 70–99)
Glucose-Capillary: 109 mg/dL — ABNORMAL HIGH (ref 70–99)
Glucose-Capillary: 128 mg/dL — ABNORMAL HIGH (ref 70–99)

## 2018-04-09 LAB — MAGNESIUM: Magnesium: 2.1 mg/dL (ref 1.7–2.4)

## 2018-04-09 LAB — CBC
HEMATOCRIT: 25.6 % — AB (ref 36.0–46.0)
Hemoglobin: 7.6 g/dL — ABNORMAL LOW (ref 12.0–15.0)
MCH: 26.6 pg (ref 26.0–34.0)
MCHC: 29.7 g/dL — ABNORMAL LOW (ref 30.0–36.0)
MCV: 89.5 fL (ref 78.0–100.0)
Platelets: 432 10*3/uL — ABNORMAL HIGH (ref 150–400)
RBC: 2.86 MIL/uL — ABNORMAL LOW (ref 3.87–5.11)
RDW: 15.6 % — AB (ref 11.5–15.5)
WBC: 17.5 10*3/uL — AB (ref 4.0–10.5)

## 2018-04-09 LAB — PHOSPHORUS: Phosphorus: 3.4 mg/dL (ref 2.5–4.6)

## 2018-04-09 MED ORDER — MODAFINIL 100 MG PO TABS
100.0000 mg | ORAL_TABLET | Freq: Every day | ORAL | Status: DC
  Administered 2018-04-10 – 2018-04-16 (×6): 100 mg via ORAL
  Filled 2018-04-09 (×7): qty 1

## 2018-04-09 MED ORDER — MIDAZOLAM HCL 2 MG/2ML IJ SOLN
4.0000 mg | Freq: Once | INTRAMUSCULAR | Status: AC
  Administered 2018-04-11: 4 mg via INTRAVENOUS
  Filled 2018-04-09: qty 4

## 2018-04-09 MED ORDER — FENTANYL CITRATE (PF) 100 MCG/2ML IJ SOLN
200.0000 ug | Freq: Once | INTRAMUSCULAR | Status: AC
  Administered 2018-04-11: 100 ug via INTRAVENOUS
  Filled 2018-04-09: qty 4

## 2018-04-09 MED ORDER — MELATONIN 3 MG PO TABS
3.0000 mg | ORAL_TABLET | Freq: Every day | ORAL | Status: DC
  Filled 2018-04-09: qty 1

## 2018-04-09 MED ORDER — MELATONIN 3 MG PO TABS
3.0000 mg | ORAL_TABLET | Freq: Every day | ORAL | Status: DC
  Administered 2018-04-09 – 2018-04-13 (×4): 3 mg
  Filled 2018-04-09 (×5): qty 1

## 2018-04-09 MED ORDER — ETOMIDATE 2 MG/ML IV SOLN
40.0000 mg | Freq: Once | INTRAVENOUS | Status: AC
  Administered 2018-04-10: 20 mg via INTRAVENOUS

## 2018-04-09 MED ORDER — LABETALOL HCL 100 MG PO TABS
100.0000 mg | ORAL_TABLET | Freq: Three times a day (TID) | ORAL | Status: DC
  Administered 2018-04-09 – 2018-04-14 (×11): 100 mg
  Filled 2018-04-09 (×10): qty 1

## 2018-04-09 MED ORDER — PROPOFOL 500 MG/50ML IV EMUL
5.0000 ug/kg/min | Freq: Once | INTRAVENOUS | Status: AC
  Administered 2018-04-11: 20 ug/kg/min via INTRAVENOUS

## 2018-04-09 MED ORDER — VECURONIUM BROMIDE 10 MG IV SOLR
10.0000 mg | Freq: Once | INTRAVENOUS | Status: AC
  Administered 2018-04-11: 10 mg via INTRAVENOUS
  Filled 2018-04-09: qty 10

## 2018-04-09 NOTE — Progress Notes (Signed)
STROKE TEAM PROGRESS NOTE   INTERVAL HISTORY Amber Rogers  daughter is at the bedside.  Patient  is   alert and interactive today. She remains neurologically stable.. Blood pressure adequately controlled.    Ventriculostomy now clamped  Plan is for elective tracheostomy on Friday a.m. By CCM team   Vitals:   04/09/18 1300 04/09/18 1400 04/09/18 1500 04/09/18 1501  BP: (!) 151/74 (!) 152/60 (!) 155/64   Pulse: 66 69 69   Resp: 20 18 19    Temp:      TempSrc:      SpO2: 100% 99% 99% 99%  Weight:      Height:        CBC:  Recent Labs  Lab 04/08/18 0432 04/09/18 0500  WBC 16.7* 17.5*  HGB 8.3* 7.6*  HCT 26.9* 25.6*  MCV 88.2 89.5  PLT 413* 432*    Basic Metabolic Panel:  Recent Labs  Lab 04/08/18 0432 04/09/18 0500  NA 136 137  K 4.2 4.3  CL 103 105  CO2 27 26  GLUCOSE 125* 115*  BUN 33* 30*  CREATININE 0.95 0.92  CALCIUM 8.3* 8.1*  MG  --  2.1  PHOS  --  3.4   Lipid Panel:     Component Value Date/Time   CHOL 177 03/30/2018 0235   TRIG 67 03/30/2018 2248   HDL 101 03/30/2018 0235   CHOLHDL 1.8 03/30/2018 0235   VLDL 10 03/30/2018 0235   LDLCALC 66 03/30/2018 0235   HgbA1c:  Lab Results  Component Value Date   HGBA1C 5.5 03/30/2018   Urine Drug Screen:     Component Value Date/Time   LABOPIA NONE DETECTED 03/29/2018 1110   COCAINSCRNUR NONE DETECTED 03/29/2018 1110   LABBENZ NONE DETECTED 03/29/2018 1110   AMPHETMU NONE DETECTED 03/29/2018 1110   THCU NONE DETECTED 03/29/2018 1110   LABBARB NONE DETECTED 03/29/2018 1110    Alcohol Level     Component Value Date/Time   ETH <10 03/27/2018 1354    IMAGING  Ct Angio Head W Or Wo Contrast 03/27/2018 IMPRESSION:  1. No vascular malformation or abnormal enhancement of the brain identified.  2. Stable cerebellar and intraventricular hemorrhage.  3. Patent anterior and posterior intracranial circulation. No large vessel occlusion, aneurysm, or significant stenosis.  4. Calcific atherosclerosis of the  carotid and vertebral arteries with mild stenosis.    Ct Head Wo Contrast 03/28/2018 IMPRESSION:  1. Stable volume of intracranial hemorrhage within the left cerebellar hemisphere and the ventricular system. Some redistribution of blood products to the lateral ventricle atria and sylvian fissures.  2. No new acute intracranial abnormality.  3. Interval right frontal approach ventriculostomy catheter placement. Mild decrease in hydrocephalus.    Ct Head Wo Contrast 03/27/2018 IMPRESSION:  2 cm hematoma in the deep left cerebellum with fourth ventricular extension extending into the third/lateral ventricles and into the dorsal cervical canal. There is mild hydrocephalus at the lateral ventricles when compared to 2016. A deep cerebellar origin favors hypertensive etiology.   CUS 1-39% ICA stenosis.  Vertebral artery flow is antegrade. Significant calcific plaque bilateral bifurcations, however, velocities are not significantly increased.   TTE - Left ventricle: The cavity size was normal. Wall thickness was   increased in a pattern of mild LVH. Systolic function was normal.   The estimated ejection fraction was in the range of 60% to 65%.   Doppler parameters are consistent with both elevated ventricular   end-diastolic filling pressure and elevated left atrial filling  pressure. - Aortic valve: There was mild stenosis. Valve area (VTI): 1.88   cm^2. Valve area (Vmax): 1.82 cm^2. Valve area (Vmean): 1.59   cm^2. - Mitral valve: Calcified annulus. - Atrial septum: No defect or patent foramen ovale was identified.  Ct Head Wo Contrast  Result Date: 03/30/2018 CLINICAL DATA:  77 y/o F; follow-up for intracranial hemorrhage, intraventricular hemorrhage, and hydrocephalus. EXAM: CT HEAD WITHOUT CONTRAST TECHNIQUE: Contiguous axial images were obtained from the base of the skull through the vertex without intravenous contrast. COMPARISON:  03/28/2018 and 03/27/2018 CT head. FINDINGS: Brain:  Stable volume acute hemorrhage within the left medial cerebellar hemisphere, ventricular system, and subarachnoid spaces of the sylvian fissures. There is some interval redistribution, for example decreased hemorrhage within the frontal horns of lateral ventricles, and increased pooling in the occipital horns. No new acute intracranial hemorrhage, stroke, or mass effect. Right frontal approach ventriculostomy catheter is stable in position traversing the frontal horn of right lateral ventricle. Stable ventricle size. Vascular: Calcific atherosclerosis of carotid siphons and vertebral arteries. Skull: Postsurgical changes related to a right frontal approach ventriculostomy catheter with decreased edema in the scalp. Sinuses/Orbits: Intubated patient. Maxillary sinus mucous retention cysts. Normal aeration of mastoid air cells. Orbits are unremarkable. Other: None. IMPRESSION: Stable volume of intraventricular, subarachnoid, and left cerebellar hemorrhage. Stable ventricle size. No new acute intracranial abnormality. Electronically Signed   By: Kristine Garbe M.D.   On: 03/30/2018 05:12    Ct Head Wo Contrast  Result Date: 04/01/2018 CLINICAL DATA:  77 y/o  F; follow-up of intracranial hemorrhage. EXAM: CT HEAD WITHOUT CONTRAST TECHNIQUE: Contiguous axial images were obtained from the base of the skull through the vertex without intravenous contrast. COMPARISON:  03/30/2018 CT head.  09/24/2014 CT head. FINDINGS: Brain: Partial interval dispersion of intraventricular, subarachnoid, and left cerebellar brain parenchymal hemorrhage. No new acute intracranial hemorrhage, stroke, or focal mass effect. Decreased size of the lateral and third ventricles, ventricle size now similar to 2016 CT head. Stable position of right frontal approach ventriculostomy catheter with tip in the right lateral ventricle near the foramen of Monro. Vascular: Calcific atherosclerosis of carotid siphons and vertebral arteries. No  hyperdense vessel identified. Skull: Right frontal burr hole postsurgical changes. Torus palatini and maxillaris. Sinuses/Orbits: Endotracheal and enteric tubes. Left maxillary sinus small mucous retention cyst. Visualized paranasal sinuses and mastoid air cells are otherwise normally aerated. Orbits are unremarkable. Other: None. IMPRESSION: 1. Partial interval dispersion of intraventricular, subarachnoid, and left cerebellar brain parenchymal hemorrhage. 2. Resolution of hydrocephalus. Stable position of right frontal approach ventriculostomy catheter. 3. No new acute intracranial process. Electronically Signed   By: Kristine Garbe M.D.   On: 04/01/2018 05:26   Dg Chest Port 1 View  Result Date: 04/01/2018 CLINICAL DATA:  Ventilator support EXAM: PORTABLE CHEST 1 VIEW COMPARISON:  03/31/2018 FINDINGS: Endotracheal tube tip is 2.5 cm above the carina. Nasogastric tube enters the stomach. Atelectasis in both lower lobes appears similar. Upper lungs remain clear. IMPRESSION: Endotracheal tube and nasogastric tube unchanged in well-positioned. Persistent atelectasis in both lower lobes. Electronically Signed   By: Nelson Chimes M.D.   On: 04/01/2018 09:08   Ct Head Wo Contrast  Result Date: 04/02/2018 CLINICAL DATA:  Postop unresponsiveness. EXAM: CT HEAD WITHOUT CONTRAST TECHNIQUE: Contiguous axial images were obtained from the base of the skull through the vertex without intravenous contrast. COMPARISON:  Earlier today FINDINGS: Brain: Recurrent lateral and third ventricular hydrocephalus with ballooning of the horns. Clot is newly seen along the  EVD catheter, which is presumably dysfunctional. There is also increased edema around the catheter which is likely tracking CSF. No rebleeding is seen. There is stable left cerebellar, intraventricular, and subarachnoid clot. Negative for infarct. Vascular: Negative Skull: Unremarkable burr hole for EVD.  Right parietal osteoma. Sinuses/Orbits: Negative  Other: During reading, case discussed with RN Roselyn Reef and findings were expected. She is currently calling Dr Kathyrn Sheriff for orders. IMPRESSION: 1. Recurrent lateral and third ventricular hydrocephalus. Newly seen clot along the EVD, presumed cause of catheter dysfunction. 2. No interval bleeding. 3. Increased low-density along the EVD, likely tracking CSF. 4. No herniation or infarct. Electronically Signed   By: Monte Fantasia M.D.   On: 04/02/2018 13:14     PHYSICAL EXAM Elderly lady who is intubated  . She has a right frontal ventriculostomy catheter. . Afebrile. Head is nontraumatic. Neck is supple without bruit.    Cardiac exam no murmur or gallop. Lungs are clear to auscultation. Distal pulses are well felt. Neurological Exam :  Intubated, awake and alert  does  follow commands well on right side and gaze. Pupils are small 2 mm pinpoint and reactive to light. Eyes right gaze preference but able to look to the left and midline,  . Blinking to visual threat on the right but not on the left.   Motor system exam she is able to withdraw all 4 extremities with painful stimuli but does not follow commands, able to against gravity RUE 3+/5 and localizing to pain with RUE. RLE 3/5 on pain, but LUE 1/5 and LLEs 2+/5 on pain stimulation. Deep tendon reflexes are 1+ symmetric, but babinski positive bilaterally. Sensation, coordination and gait not tested.    ASSESSMENT/PLAN Amber Rogers is a 77 y.o. female with history of COPD, CHF, HTN, HLD, CKD III, CAD s/p stent presenting with sudden onset nausea, vomiting and dizziness.   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  Mild hydrocephalus  lateral ventricles.    CTA head no vascular malformation or abnormal enhancement.  Stable cerebellar and IVH.  No LVO.  Intracranial atherosclerosis.  Repeat CT x 3 - Stable volume of intracranial hemorrhage and hydrocephalus  CUS - Significant calcific plaque bilateral bifurcations, however, velocities are not significantly increased.  2D Echo EF 60-65%  UDS neg  LDL 66  HgbA1c 5.5  Heparin subq for VTE prophylaxis  NPO on TF @ 50cc/h  aspirin 81 mg daily and Brilinta 90 mg twice daily prior to admission, now on no antithrombotics  Therapy recommendations:  Pending  Disposition:  pending   Encephalopathy   Depressed mental status even off sedation, however, following commands on 04/01/18 pm  Could be due to brainstem irritation with 4th IVH vs. Sleep wake cycle disturbance  AKI on CKD - Cre 1.36-1.33-1.35-1.43->1.57->1.25->1.12->1.11  Leukocytosis - WBC 14.0->16.2->24.8->19.2->15.3->12.4  fever Tmax 101.1->102.4->100.5->afebrile->102->100.4  CXR - bilateral basilar atelectasis  UA WBC 6-10  CSF WBC 46, RBC 8000, protein 84, glucose 73, culture negative  On  cefepime for empirical antibiotics.   Obstructive hydrocephalus s/p R frontal EVD   EVD placed 8/22, replaced 8/28  CT showed recurrent hydro prior to EVD replacement  NSG on board  Currently EVD patent  Carotid stenosis   CUS 08/2017  R ICA stenosis 50-69%, L ICA < 50%  CUS repeat showed b/l ICA 1-39% stenosis but significant calcified plaque at carotid bifurcation bilaterally.   Acute Hypoxic Respiratory failure - extubation and re-intubation  Intubated in the ED with neuro decline   off sedation   Still depressed mental status - however intermittent lucid periods in the afternoon and following commands.   CCM on board  Extubated and re-intubated 04/03/18  Hypertensive Emergency  BP stable off cardene  Home Meds: Norvasc 10, resumed  increase labetalol to 200mg  tid  SBP goal <  160  Hyperlipidemia  Home meds:  ?? Lipitor 80   LDL 66  Consider to resume statin on discharge  Other Stroke Risk Factors  Advanced age  Former Cigarette smoker, quit 2 years ago  Coronary artery disease s/p stent - on ASA and brilinta PTA  Chronic Congestive heart failure  Other Active Problems  Hx vertigo   COPD - on home prednisone per tube  Leukocytosis - 14.9 ->14.0->14.3->16.2->24.8->19.2->15.3->12.4->16.7  CKD stage III - Creatinine - 1.35->1.43->1.57->1.25->1.12->1.11->1.16  Hospital day # 13 Plan continue ventilatory wean and strict blood pressure control   goal   systolic below 761. Continue to wean off ventriculostomy as per neurosurgery Hopefully DC ventriculostomy tomorrow if follow-up CT scan appears stable   Long discussion with Dr Lynetta Mare and Dr. Kathyrn Sheriff: at the bedside and answered questions This patient is critically ill due to cerebellar [place] with IVH and hydrocephalus, respiratory failure, hypertensive emergency and at significant risk of neurological worsening, death form recurrent bleeding, hydrocephalus, increased ICP, cerebral edema and brain death, seizure. This patient's care requires constant monitoring of vital signs, hemodynamics, respiratory and cardiac monitoring, review of multiple databases, neurological assessment, discussion with family, other specialists and medical decision making of high complexity. I spent 30 minutes of neurocritical care time in the care of this patient. I had long discussion with daughter at bedside, updated pt current condition, treatment plan and potential prognosis. Antony Contras, MD Stroke Neurology 04/09/2018 3:10 PM    To contact Stroke Continuity provider, please refer to [email or web address] After hours, contact General Neurology

## 2018-04-09 NOTE — Progress Notes (Addendum)
Neurosurgery Progress Note  No issues overnight.   EXAM:  BP (!) 153/61   Pulse (!) 59   Temp 99.9 F (37.7 C) (Axillary)   Resp (!) 21   Ht 5\' 6"  (1.676 m)   Wt 66.7 kg   SpO2 100%   BMI 23.73 kg/m   Intubated Follows commands (showing thumbs b/l, moving feet) EVD clamped  PLAN Doing well this am and following commands Keep EVD clamped. Repeat head CT tomorrow am.   I have seen and examined [name] and agree with the exam, impression, and plan as documented in the note by Ferne Reus, PA-C.  Consuella Lose, MD [facility]

## 2018-04-09 NOTE — Evaluation (Signed)
Occupational Therapy Evaluation Patient Details Name: Amber Rogers MRN: [id number] DOB: Nov 17, 1940 Today's Date: 04/09/2018    History of Present Illness 77 year old female with PMH of HTN, CKD, CHF (EF 55% and grade 2 DD in 2016), and COPD. She presented to [facility] ED 8/22 with complaints of headache, nausea, and vomiting with onset of symptoms around 1200. She was found to be hypertensive. CT scan demonstrated hematoma in the left cerebellum with early hydrocephalus. Initially no neurosurgical intervention was indicated due to her reasonable mental status and dual antiplatelet regimen, however, her condition continued to decline and she required intubation. She was then deemed a candidate for EVD placement.   Clinical Impression   PT admitted with L cerebellum hydrocephalus s/p EVD placement. Pt currently with functional limitiations due to the deficits listed below (see OT problem list). Pt currently requires total (A) for adls and with balance deficits. Pt demonstrates extensor tone which could benefit basic transfer however pt at risk for eob sitting with LOB due to tone.  Pt will benefit from skilled OT to increase their independence and safety with adls and balance to allow discharge SNF.     Follow Up Recommendations  SNF    Equipment Recommendations  3 in 1 bedside commode;Wheelchair (measurements OT);Wheelchair cushion (measurements OT);Hospital bed    Recommendations for Other Services       Precautions / Restrictions Precautions Precautions: Fall;Other (comment) Precaution Comments: ventri in place/ vent pending trach/ foley/ wrist restraints Restrictions Weight Bearing Restrictions: No      Mobility Bed Mobility Overal bed mobility: Needs Assistance Bed Mobility: Rolling;Supine to Sit;Sit to Supine Rolling: Max assist   Supine to sit: Total assist;+2 for physical assistance Sit to supine: Total assist;+2 for physical assistance   General bed mobility comments:  3rd therapist present to help with lines /leads vent and ventri line  Transfers Overall transfer level: Needs assistance   Transfers: Sit to/from Stand Sit to Stand: Max assist         General transfer comment: pt does not achieve full upright posture but able to weight bear with bil LE blocked    Balance Overall balance assessment: Needs assistance Sitting-balance support: Single extremity supported;Feet supported Sitting balance-Leahy Scale: Zero Sitting balance - Comments: heavy resistance and extending bil LE Postural control: Posterior lean                     High Level Balance Comments: extensor tone helps with sit<>stand           ADL either performed or assessed with clinical judgement   ADL Overall ADL's : Needs assistance/impaired Eating/Feeding: NPO   Grooming: Maximal assistance Grooming Details (indicate cue type and reason): washing face Upper Body Bathing: Total assistance   Lower Body Bathing: Total assistance   Upper Body Dressing : Total assistance   Lower Body Dressing: Total assistance                 General ADL Comments: pt completed sit to stand this session x2 for pad placement     Vision Baseline Vision/History: No visual deficits(per daughter) Additional Comments: scanning room but difficult to fully assess due to following commands     Perception     Praxis      Pertinent Vitals/Pain Pain Assessment: Faces Faces Pain Scale: Hurts little more Pain Location: generalize Pain Descriptors / Indicators: Grimacing Pain Intervention(s): Monitored during session;Premedicated before session;Repositioned     Hand Dominance Right  Extremity/Trunk Assessment Upper Extremity Assessment Upper Extremity Assessment: RUE deficits/detail RUE Deficits / Details: 3 out 5 but noted to have edema. pt moving L more than R. pt does show elbow flexion to attempt to grab tubing. pt with wrist restraint currenlty   Lower Extremity  Assessment Lower Extremity Assessment: Defer to PT evaluation   Cervical / Trunk Assessment Cervical / Trunk Assessment: Kyphotic   Communication Communication Communication: Other (comment)(intubated with vetn)   Cognition Arousal/Alertness: Awake/alert Behavior During Therapy: Restless;Impulsive Overall Cognitive Status: Difficult to assess                                 General Comments: pt restless and nods "YES" when asked if the vent is the source of discomfort. pt attempting to reach for tubing during session   General Comments  risk for skin break down at wrist restraints due to edema an drestless    Exercises     Shoulder Instructions      Home Living Family/patient expects to be discharged to:: Private residence Living Arrangements: Spouse/significant other Available Help at Discharge: Family Type of Home: House Home Access: Stairs to enter Technical brewer of Steps: 3 Entrance Stairs-Rails: None Home Layout: One level     Bathroom Shower/Tub: Teacher, early years/pre: Standard         Additional Comments: hx from daughter present in the room however the daughter was asking the patient "is that right momma? " "Momma do you have stairs? " daughter seemed uncertain at times regarding the answers.       Prior Functioning/Environment Level of Independence: Independent        Comments: driving, going out to lunch with granddaughter. daughter reports "she drove to my daughters two weeks ago for a birthday party"        OT Problem List: Decreased strength;Decreased range of motion;Decreased activity tolerance;Impaired balance (sitting and/or standing);Impaired vision/perception;Decreased coordination;Decreased cognition;Decreased safety awareness;Decreased knowledge of use of DME or AE;Decreased knowledge of precautions;Cardiopulmonary status limiting activity;Impaired tone;Obesity;Impaired UE functional use;Pain;Increased edema       OT Treatment/Interventions: Self-care/ADL training;Therapeutic exercise;Neuromuscular education;Energy conservation;DME and/or AE instruction;Manual therapy;Modalities;Splinting;Therapeutic activities;Cognitive remediation/compensation;Visual/perceptual remediation/compensation;Patient/family education;Balance training    OT Goals(Current goals can be found in the care plan section) Acute Rehab OT Goals Patient Stated Goal: none stated intubated OT Goal Formulation: Patient unable to participate in goal setting Time For Goal Achievement: 04/23/18 Potential to Achieve Goals: Good  OT Frequency: Min 2X/week   Barriers to D/C:            Co-evaluation PT/OT/SLP Co-Evaluation/Treatment: Yes Reason for Co-Treatment: Complexity of the patient's impairments (multi-system involvement);Necessary to address cognition/behavior during functional activity;For patient/therapist safety;To address functional/ADL transfers   OT goals addressed during session: ADL's and self-care;Proper use of Adaptive equipment and DME;Strengthening/ROM      AM-PAC PT "6 Clicks" Daily Activity     Outcome Measure Help from another person eating meals?: A Lot Help from another person taking care of personal grooming?: Total Help from another person toileting, which includes using toliet, bedpan, or urinal?: Total Help from another person bathing (including washing, rinsing, drying)?: Total Help from another person to put on and taking off regular upper body clothing?: Total Help from another person to put on and taking off regular lower body clothing?: Total 6 Click Score: 7   End of Session Equipment Utilized During Treatment: Oxygen Nurse Communication: Mobility status;Need for lift equipment;Precautions  Activity Tolerance: Patient tolerated treatment well Patient left: in bed;with call bell/phone within reach;with bed alarm set;with restraints reapplied;with SCD's reapplied;with family/visitor  present  OT Visit Diagnosis: Unsteadiness on feet (R26.81);Muscle weakness (generalized) (M62.81);Pain;Cognitive communication deficit (U57.505)                Time: 1833-5825 OT Time Calculation (min): 28 min Charges:  OT General Charges $OT Visit: 1 Visit OT Evaluation $OT Eval High Complexity: 1 High   Jeri Modena, OTR/L  Acute Rehabilitation Services Pager: [phone] Office: [phone] .   Parke Poisson B 04/09/2018, 5:41 PM

## 2018-04-09 NOTE — Progress Notes (Signed)
Nutrition Follow-up  DOCUMENTATION CODES:   Not applicable  INTERVENTION:   Continue Vital AF 1.2 @  ml/hr 50 (1200 ml/day) via OGT  Tube feeding regimen provides 1440 kcal, 90 grams of protein, and 972 ml of H2O. Meets 106% of calorie needs and 100% of protein needs.   NUTRITION DIAGNOSIS:   Inadequate oral intake related to acute illness as evidenced by NPO status.  Ongoing  GOAL:   Patient will meet greater than or equal to 90% of their needs  Met with TF  MONITOR:   Vent status, Labs, Weight trends, TF tolerance  REASON FOR ASSESSMENT:   Ventilator    ASSESSMENT:   77 yo female admitted with deep left cerebellar hemorrhage with 3rd and 4th ventricle extension and mild hydrocephalus. Pt required intubation for airway protection. Pt with hx of HTN, CKD   8/22 - R frontal ventriculostomy 8/27 - EVD became non-functional in the evening 8/28 - ETT replaced, EVD replaced, failed extubation 8/29- failed extubation 9/3- tracheal aspiration  Pt discussed during ICU rounds and with RN.  Precedex discontinued today. Mental status improving per neurosurgery. Secretions and cough are unchanged, pt unable to extubate.  Plan for tracheostomy 9/6. EVD clamped at this time.   Patient remains intubated on ventilator support MV: 10.6 L/min Temp (24hrs), Avg:99.8 F (37.7 C), Min:98.2 F (36.8 C), Max:100.9 F (38.3 C) BP: 141/90 MAP: 106 Propofol: off  I/O: +9.4 L since admit- holding off diuresis as she seems to be doing it on her own UOP: 2125 ml x 24 hrs  Weight noted to decrease from 160 lb on 8/29 to 147 lb today. Will continue to monitor.   Medications reviewed and include: fentanyl, prednisone Labs reviewed.   Diet Order:   Diet Order            Diet NPO time specified  Diet effective midnight              EDUCATION NEEDS:   No education needs have been identified at this time  Skin:  Skin Assessment: Reviewed RN Assessment  Last BM:   04/07/18  Height:   Ht Readings from Last 1 Encounters:  04/08/18 5' 6"  (1.676 m)    Weight:   Wt Readings from Last 1 Encounters:  04/09/18 66.7 kg    Ideal Body Weight:  59.1 kg  BMI:  Body mass index is 23.73 kg/m.  Estimated Nutritional Needs:   Kcal:  1360 kcal  Protein:  80-120 grams  Fluid:  >/= 1.5 L/day  Amber Rogers RD, LDN Clinical Nutrition Pager # - [phone]

## 2018-04-09 NOTE — Progress Notes (Addendum)
PULMONARY / CRITICAL CARE MEDICINE   Name: Amber Rogers MRN: [id number] DOB: 1940/10/12    ADMISSION DATE:  03/27/2018 CONSULTATION DATE:  03/27/2018  REFERRING MD:  Dr. Leonel Ramsay  CHIEF COMPLAINT:  ICH  BRIEF SUMMARY:  77 year old female with PMH of HTN, CKD, CHF (EF 55% and grade 2 DD in 2016), and COPD. She presented to [facility] ED 8/22 with complaints of headache, nausea, and vomiting with onset of symptoms around 1200. She was found to be hypertensive. CT scan demonstrated hematoma in the left cerebellum with early hydrocephalus. Initially no neurosurgical intervention was indicated due to her reasonable mental status and dual antiplatelet regimen, however, her condition continued to decline and she required intubation. She was then deemed a candidate for EVD placement. PCCM consulted for medical/vent/BP management. Ongoing course complicated by decreased mentation. She has been able to follow commands, and was able to wake up some. Extubation was attempted on 8/28 and 8/29, but she failed due to stridor and has subsequently not had a cuff leak with deflated cuff.   INTERVAL:  Tolerating PSV at 5/5, follows basic commands.   VITAL SIGNS: BP (!) 153/61   Pulse (!) 59   Temp (!) 100.6 F (38.1 C) (Axillary)   Resp (!) 21   Ht 5\' 6"  (1.676 m)   Wt 66.7 kg   SpO2 100%   BMI 23.73 kg/m   HEMODYNAMICS:    VENTILATOR SETTINGS: Vent Mode: CPAP;PSV FiO2 (%):  [30 %] 30 % Set Rate:  [15 bmp] 15 bmp Vt Set:  [470 mL] 470 mL PEEP:  [5 cmH20] 5 cmH20 Pressure Support:  [5 cmH20-8 cmH20] 5 cmH20 Plateau Pressure:  [16 cmH20-17 cmH20] 16 cmH20  INTAKE / OUTPUT: I/O last 3 completed shifts: In: 2125.5 [I.V.:468; NG/GT:1657.5] Out: 3015 [Urine:3015]  PHYSICAL EXAMINATION: General: elderly female in NAD on vent HEENT: [place]/AT, PERRL, no JVD Neuro: Opens eyes to voice follows intermittent commands moves all extremities CV: RRR, no MRG   PULM:  Clear bilateral breath sounds. Some  thick secretions started 9/3. GI: Soft, nontender, nondistended Extremities: No acute deformity Skin:Grossly intact.   LABS:  BMET Recent Labs  Lab 04/07/18 0929 04/08/18 0432 04/09/18 0500  NA 136 136 137  K 4.3 4.2 4.3  CL 101 103 105  CO2 29 27 26   BUN 36* 33* 30*  CREATININE 1.05* 0.95 0.92  GLUCOSE 124* 125* 115*   Electrolytes Recent Labs  Lab 04/07/18 0929 04/08/18 0432 04/09/18 0500  CALCIUM 8.3* 8.3* 8.1*  MG  --   --  2.1  PHOS  --   --  3.4   CBC Recent Labs  Lab 04/07/18 0929 04/08/18 0432 04/09/18 0500  WBC 15.3* 16.7* 17.5*  HGB 8.2* 8.3* 7.6*  HCT 27.0* 26.9* 25.6*  PLT 430* 413* 432*   Coag's No results for input(s): APTT, INR in the last 168 hours. Sepsis Markers No results for input(s): LATICACIDVEN, PROCALCITON, O2SATVEN in the last 168 hours.  ABG Recent Labs  Lab 04/03/18 2023  PHART 7.376  PCO2ART 45.5  PO2ART 300.0*    Liver Enzymes No results for input(s): AST, ALT, ALKPHOS, BILITOT, ALBUMIN in the last 168 hours.  Cardiac Enzymes No results for input(s): TROPONINI, PROBNP in the last 168 hours.  Glucose Recent Labs  Lab 04/08/18 1201 04/08/18 1553 04/08/18 1957 04/08/18 2323 04/09/18 0348 04/09/18 0810  GLUCAP 113* 130* 116* 111* 123* 139*    Imaging No results found.   STUDIES:  CT head  8/22 > 2 cm hematoma in the deep left cerebellum with fourth ventricular extension extending into the third/lateral ventricles and into the dorsal cervical canal. There is mild hydrocephalus at the lateral ventricles when compared to 2016. A deep cerebellar origin favors hypertensive etiology. CT angio head 8/22 > No vascular malformation or abnormal enhancement of the brain identified. Stable cerebellar and intraventricular hemorrhage. Patent anterior and posterior intracranial circulation. No large vessel occlusion, aneurysm, or significant stenosis. Calcific atherosclerosis of the carotid and vertebral arteries with mild  stenosis. CT Head w/o 8/27 >  Partial interval dispersion of intraventricular, subarachnoid and left cerebellar brain parenchymal hemorrhage.  Resolution of hydrocephalus.  Stable position of right frontal approach ventric catheter, no new acute process.  CT head 9/3 > evolving left cerebellar hematoma with intraventricular extension, stable mild hydrocephalus with right frontal ventriculostomy, small volume redistributed SAH  CULTURES: BCx2 8/26 >> NEG  Tracheal Aspirate 8/26 >> negative  CSF 8/29 >> negative Trach aspirate 9/3 >  ANTIBIOTICS: Vanco 8/26 >> 9/2 Cefepime 8/26 >> 9/2  SIGNIFICANT EVENTS: 8/22 Admit, EVD placed.  8/26 Fever, empiric abx added  8/28 EVD replaced (clogged), ETT replaced (bit through).  Added provigil.  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 Secondary to hypertensive crisis.  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 Will try to keep this off. Add melatonin for tonight.  RASS goal: 0  Intubated for airway protection in the setting of intracranial hemorrhage - has failed extubation x 1 due to stridor. Hx Tobacco Abuse  P: PSV as able - given her failed extubation and lack of cuff leak, she will likely require tracheostomy.  Bronchial hygiene Follow CXR intermittently    COPD - without acute exacerbation P: Duoneb Q6  Brovana + Pulmicort BID   Osteoarthritis P: Cont prednisone 5mg  QD (chronic dose)   Hx Chronic CHF, HTN Bradycardia improved   P: Cont Labetolol, amlodipine PRN hydralazine for SBP goal < 180 Holding diuresis (seems to be doing it on her own)  Non AG Metabolic acidosis due to Hyperchloremia -  resolved Hyperkalemia - resolved  P: Cont free water 246mL q 8 hours. Trend BMP / urinary output  Leukocytosis - no indication of infection Fever - new 8/26-temp tr down.  S/p course of abx.  CSF negative. P: Monitor  Anemia - no signs of bleeding P: Follow CBC Consider transfusion for Hgb < 7  Best Practice GI: PPI  DVT: Hep  Feeding: TF   Family: Daughter updated at bedside 9/4  Georgann Housekeeper, AGACNP-BC [facility] Pulmonology/Critical Care Pager [phone] or [phone]  04/09/2018 9:03 AM

## 2018-04-09 NOTE — Progress Notes (Signed)
Physical Therapy Treatment Patient Details Name: SOLSTICE LASTINGER MRN: [id number] DOB: Apr 10, 1941 Today's Date: 04/09/2018    History of Present Illness 77 year old female with PMH of HTN, CKD, CHF (EF 55% and grade 2 DD in 2016), and COPD. She presented to [facility] ED 8/22 with complaints of headache, nausea, and vomiting with onset of symptoms around 1200. She was found to be hypertensive. CT scan demonstrated hematoma in the left cerebellum with early hydrocephalus. Initially no neurosurgical intervention was indicated due to her reasonable mental status and dual antiplatelet regimen, however, her condition continued to decline and she required intubation. She was then deemed a candidate for EVD placement.    PT Comments    Pt able to keep eyes open and draw focus to task at least >75% of time.  Emphasis on transitions to EOB and sitting balance at EOB.  In course of working EOB, pt needed to be assisted to stand with scoot back on the bed or lateral scoot up to [facility] to lay down.  Vitals on vent stable.    Follow Up Recommendations  CIR;Other (comment)     Equipment Recommendations  Other (comment)    Recommendations for Other Services Rehab consult     Precautions / Restrictions Precautions Precautions: Fall;Other (comment) Precaution Comments: ventri in place/ vent pending trach/ foley/ wrist restraints Restrictions Weight Bearing Restrictions: No    Mobility  Bed Mobility Overal bed mobility: Needs Assistance Bed Mobility: Rolling;Supine to Sit;Sit to Supine Rolling: Max assist   Supine to sit: Total assist;+2 for physical assistance Sit to supine: Total assist;+2 for physical assistance   General bed mobility comments: 3rd therapist present to help with lines /leads vent and ventri line  Transfers Overall transfer level: Needs assistance   Transfers: Sit to/from Stand;Lateral/Scoot Transfers Sit to Stand: Max assist;+2 safety/equipment        Lateral/Scoot  Transfers: Max assist;+2 safety/equipment General transfer comment: pt does not achieve full upright posture but able to weight bear with bil LE blocked  Ambulation/Gait                 Stairs             Wheelchair Mobility    Modified Rankin (Stroke Patients Only)       Balance Overall balance assessment: Needs assistance Sitting-balance support: Single extremity supported;Feet supported Sitting balance-Leahy Scale: Zero Sitting balance - Comments: heavy resistance and extending bil LE Postural control: Posterior lean                     High Level Balance Comments: extensor tone helps with sit<>stand            Cognition Arousal/Alertness: Awake/alert Behavior During Therapy: Restless;Impulsive Overall Cognitive Status: Difficult to assess                                 General Comments: pt restless and nods "YES" when asked if the vent is the source of discomfort. pt attempting to reach for tubing during session      Exercises      General Comments General comments (skin integrity, edema, etc.): risk for skin break down at wrist restraints due to edema an drestless      Pertinent Vitals/Pain Pain Assessment: Faces Faces Pain Scale: Hurts little more Pain Location: generalize Pain Descriptors / Indicators: Grimacing Pain Intervention(s): Monitored during session    Home Living  Family/patient expects to be discharged to:: Private residence Living Arrangements: Spouse/significant other Available Help at Discharge: Family Type of Home: House Home Access: Stairs to enter Entrance Stairs-Rails: None Home Layout: One level   Additional Comments: hx from daughter present in the room however the daughter was asking the patient "is that right momma? " "Momma do you have stairs? " daughter seemed uncertain at times regarding the answers.     Prior Function Level of Independence: Independent      Comments: driving, going  out to lunch with granddaughter. daughter reports "she drove to my daughters two weeks ago for a birthday party"   PT Goals (current goals can now be found in the care plan section) Acute Rehab PT Goals Patient Stated Goal: none stated intubated PT Goal Formulation: Patient unable to participate in goal setting Time For Goal Achievement: 04/22/18 Potential to Achieve Goals: Fair Progress towards PT goals: Progressing toward goals    Frequency    Min 3X/week      PT Plan Current plan remains appropriate    Co-evaluation PT/OT/SLP Co-Evaluation/Treatment: Yes Reason for Co-Treatment: Complexity of the patient's impairments (multi-system involvement) PT goals addressed during session: Mobility/safety with mobility OT goals addressed during session: ADL's and self-care      AM-PAC PT "6 Clicks" Daily Activity  Outcome Measure  Difficulty turning over in bed (including adjusting bedclothes, sheets and blankets)?: Unable Difficulty moving from lying on back to sitting on the side of the bed? : Unable Difficulty sitting down on and standing up from a chair with arms (e.g., wheelchair, bedside commode, etc,.)?: Unable Help needed moving to and from a bed to chair (including a wheelchair)?: Total Help needed walking in hospital room?: Total Help needed climbing 3-5 steps with a railing? : Total 6 Click Score: 6    End of Session Equipment Utilized During Treatment: Other (comment)(vent) Activity Tolerance: Patient tolerated treatment well;Other (comment) Patient left: in bed;with call bell/phone within reach;with bed alarm set;with nursing/sitter in room Nurse Communication: Mobility status PT Visit Diagnosis: Other abnormalities of gait and mobility (R26.89);Other symptoms and signs involving the nervous system (R29.898)     Time: 8786-7672 PT Time Calculation (min) (ACUTE ONLY): 28 min  Charges:  $Therapeutic Activity: 8-22 mins                     04/09/2018  [name], PT [phone]  (pager)   Tessie Fass Khristi Schiller 04/09/2018, 6:22 PM

## 2018-04-10 ENCOUNTER — Inpatient Hospital Stay (HOSPITAL_COMMUNITY): Payer: Medicare Other

## 2018-04-10 ENCOUNTER — Encounter (HOSPITAL_COMMUNITY): Payer: Medicare Other

## 2018-04-10 LAB — GLUCOSE, CAPILLARY
GLUCOSE-CAPILLARY: 143 mg/dL — AB (ref 70–99)
Glucose-Capillary: 115 mg/dL — ABNORMAL HIGH (ref 70–99)
Glucose-Capillary: 137 mg/dL — ABNORMAL HIGH (ref 70–99)
Glucose-Capillary: 140 mg/dL — ABNORMAL HIGH (ref 70–99)
Glucose-Capillary: 103 mg/dL — ABNORMAL HIGH (ref 70–99)
Glucose-Capillary: 124 mg/dL — ABNORMAL HIGH (ref 70–99)

## 2018-04-10 LAB — CULTURE, RESPIRATORY

## 2018-04-10 LAB — CULTURE, RESPIRATORY W GRAM STAIN

## 2018-04-10 MED ORDER — ETOMIDATE 2 MG/ML IV SOLN
INTRAVENOUS | Status: AC
  Administered 2018-04-10: 20 mg via INTRAVENOUS
  Filled 2018-04-10: qty 10

## 2018-04-10 MED ORDER — SUCCINYLCHOLINE CHLORIDE 20 MG/ML IJ SOLN
100.0000 mg | Freq: Once | INTRAMUSCULAR | Status: AC
  Administered 2018-04-10: 100 mg via INTRAVENOUS

## 2018-04-10 MED ORDER — DEXAMETHASONE SODIUM PHOSPHATE 10 MG/ML IJ SOLN
10.0000 mg | Freq: Once | INTRAMUSCULAR | Status: AC
  Administered 2018-04-10: 10 mg via INTRAVENOUS

## 2018-04-10 MED ORDER — RACEPINEPHRINE HCL 2.25 % IN NEBU
INHALATION_SOLUTION | RESPIRATORY_TRACT | Status: AC
  Administered 2018-04-10: 0.5 mL
  Filled 2018-04-10: qty 0.5

## 2018-04-10 MED ORDER — DEXAMETHASONE SODIUM PHOSPHATE 10 MG/ML IJ SOLN
INTRAMUSCULAR | Status: AC
  Administered 2018-04-10: 10 mg via INTRAVENOUS
  Filled 2018-04-10: qty 1

## 2018-04-10 MED ORDER — IPRATROPIUM-ALBUTEROL 0.5-2.5 (3) MG/3ML IN SOLN
3.0000 mL | Freq: Three times a day (TID) | RESPIRATORY_TRACT | Status: DC
  Administered 2018-04-10 – 2018-04-22 (×39): 3 mL via RESPIRATORY_TRACT
  Filled 2018-04-10 (×39): qty 3

## 2018-04-10 NOTE — Progress Notes (Addendum)
PULMONARY / CRITICAL CARE MEDICINE   Name: Amber Rogers MRN: [id number] DOB: Jan 29, 1941    ADMISSION DATE:  03/27/2018 CONSULTATION DATE:  03/27/2018  REFERRING MD:  Dr. Leonel Ramsay  CHIEF COMPLAINT:  ICH  BRIEF SUMMARY:  77 year old female with PMH of HTN, CKD, CHF (EF 55% and grade 2 DD in 2016), and COPD. She presented to [facility] ED 8/22 with complaints of headache, nausea, and vomiting with onset of symptoms around 1200. She was found to be hypertensive. CT scan demonstrated hematoma in the left cerebellum with early hydrocephalus. Initially no neurosurgical intervention was indicated due to her reasonable mental status and dual antiplatelet regimen, however, her condition continued to decline and she required intubation. She was then deemed a candidate for EVD placement. PCCM consulted for medical/vent/BP management. Ongoing course complicated by decreased mentation. She has been able to follow commands, and was able to wake up some. Extubation was attempted on 8/28 and 8/29, but she failed due to stridor and has subsequently not had a cuff leak with deflated cuff.   INTERVAL:  CT overnight shows slow improvement. Continues to work well with PSV on vent. Follows some commands.   VITAL SIGNS: BP (!) 148/59   Pulse (!) 55   Temp 99.7 F (37.6 C) (Oral)   Resp 16   Ht 5\' 6"  (1.676 m)   Wt 67.5 kg   SpO2 100%   BMI 24.02 kg/m   HEMODYNAMICS:    VENTILATOR SETTINGS: Vent Mode: PRVC FiO2 (%):  [30 %-40 %] 40 % Set Rate:  [15 bmp] 15 bmp Vt Set:  [470 mL] 470 mL PEEP:  [5 cmH20] 5 cmH20 Pressure Support:  [5 cmH20] 5 cmH20 Plateau Pressure:  [15 cmH20-23 cmH20] 19 cmH20  INTAKE / OUTPUT: I/O last 3 completed shifts: In: 1463.1 [I.V.:713.1; NG/GT:750] Out: 4200 [Urine:4200]  PHYSICAL EXAMINATION: General: elderly female in NAD on vent HEENT: Singer/AT, PERRL, no JVD. EVD in place.  Neuro: Opens eyes to voice and intermittently follows commands.  CV: RRR, no MRG   PULM:   Clear bilateral breah sounds GI: Soft, non tender, nondistended Extremities: No acute deformity. No edema.  Skin:Grossly intact.   LABS:  BMET Recent Labs  Lab 04/07/18 0929 04/08/18 0432 04/09/18 0500  NA 136 136 137  K 4.3 4.2 4.3  CL 101 103 105  CO2 29 27 26   BUN 36* 33* 30*  CREATININE 1.05* 0.95 0.92  GLUCOSE 124* 125* 115*   Electrolytes Recent Labs  Lab 04/07/18 0929 04/08/18 0432 04/09/18 0500  CALCIUM 8.3* 8.3* 8.1*  MG  --   --  2.1  PHOS  --   --  3.4   CBC Recent Labs  Lab 04/07/18 0929 04/08/18 0432 04/09/18 0500  WBC 15.3* 16.7* 17.5*  HGB 8.2* 8.3* 7.6*  HCT 27.0* 26.9* 25.6*  PLT 430* 413* 432*   Coag's No results for input(s): APTT, INR in the last 168 hours. Sepsis Markers No results for input(s): LATICACIDVEN, PROCALCITON, O2SATVEN in the last 168 hours.  ABG Recent Labs  Lab 04/03/18 2023  PHART 7.376  PCO2ART 45.5  PO2ART 300.0*    Liver Enzymes No results for input(s): AST, ALT, ALKPHOS, BILITOT, ALBUMIN in the last 168 hours.  Cardiac Enzymes No results for input(s): TROPONINI, PROBNP in the last 168 hours.  Glucose Recent Labs  Lab 04/09/18 1145 04/09/18 1654 04/09/18 1950 04/09/18 2316 04/10/18 0345 04/10/18 0819  GLUCAP 116* 114* 109* 128* 115* 137*    Imaging  Ct Head Wo Contrast  Result Date: 04/10/2018 CLINICAL DATA:  Follow-up examination for a communicating hydrocephalus. EXAM: CT HEAD WITHOUT CONTRAST TECHNIQUE: Contiguous axial images were obtained from the base of the skull through the vertex without intravenous contrast. COMPARISON:  Prior CT from 04/08/2018 FINDINGS: Brain: Ventriculomegaly overall not significantly changed relative to previous exam. Degenerating blood products seen layering within the occipital horns of the lateral ventricles. Right frontal approach ventriculostomy remains in place with tip near the foramen of Monro. Small amount of blood products along the ventricular tract with  surrounding edema. Left cerebellar hematoma continues to evolve, slightly less prominent as compared to previous. Persistent surrounding vasogenic edema with mass effect and partial effacement on the adjacent fourth ventricle, slightly improved. Stable atrophy with chronic small vessel ischemic disease. No acute large vessel territory infarct. Chronic right cerebellar infarct noted. No extra-axial fluid collection. Vascular: No hyperdense vessel. Calcified atherosclerosis at the skull base. Skull: Right frontal burr hole with overlying skin staples. Scalp soft tissues and calvarium demonstrate no acute new abnormality. Sinuses/Orbits: Globes and orbital soft tissues demonstrate no acute finding. Mild mucosal thickening within the maxillary sinuses. Bilateral mastoid effusions are similar to previous. Other: Multiple dental caries noted. IMPRESSION: 1. Continued interval evolution of left cerebellar hematoma with slightly improved localized vasogenic edema, with persistent but slightly improved mass effect on the adjacent fourth ventricle. 2. Intraventricular extension with similar small volume intraventricular blood products. Stable mild hydrocephalus with right frontal ventriculostomy in place. 3. No other new acute intracranial abnormality. Electronically Signed   By: Jeannine Boga M.D.   On: 04/10/2018 05:49     STUDIES:  CT head 8/22 > 2 cm hematoma in the deep left cerebellum with fourth ventricular extension extending into the third/lateral ventricles and into the dorsal cervical canal. There is mild hydrocephalus at the lateral ventricles when compared to 2016. A deep cerebellar origin favors hypertensive etiology. CT angio head 8/22 > No vascular malformation or abnormal enhancement of the brain identified. Stable cerebellar and intraventricular hemorrhage. Patent anterior and posterior intracranial circulation. No large vessel occlusion, aneurysm, or significant stenosis. Calcific  atherosclerosis of the carotid and vertebral arteries with mild stenosis. CT Head w/o 8/27 >  Partial interval dispersion of intraventricular, subarachnoid and left cerebellar brain parenchymal hemorrhage.  Resolution of hydrocephalus.  Stable position of right frontal approach ventric catheter, no new acute process.  CT head 9/3 > evolving left cerebellar hematoma with intraventricular extension, stable mild hydrocephalus with right frontal ventriculostomy, small volume redistributed SAH CT head 9/5 > Continued interval evolution of left cerebellar hematoma with slightly improved localized vasogenic edema, with persistent but slightly improved mass effect on the adjacent fourth ventricle. Intraventricular extension with similar small volume intraventricular blood products. Stable mild hydrocephalus with right frontal ventriculostomy in place.  CULTURES: BCx2 8/26 > NEG  Tracheal Aspirate 8/26 >> negative  CSF 8/29 >> negative Trach aspirate 9/3 >  ANTIBIOTICS: Vanco 8/26 >> 9/2 Cefepime 8/26 >> 9/2  SIGNIFICANT EVENTS: 8/22 Admit, EVD placed.  8/26 Fever, empiric abx added  8/28 EVD replaced (clogged), ETT replaced (bit through).  Added provigil.  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 Secondary to hypertensive crisis.  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 Melatonin for  tonight.  Continue Provigil daily RASS goal: 0  Intubated for airway protection in the setting of intracranial hemorrhage - has failed extubation x 1 due to stridor. Hx Tobacco Abuse  P: PSV as able - Plan for tracheostomy tomorrow morning at 10am. Bronchial hygiene Follow CXR intermittently    COPD - without acute  exacerbation P: Duoneb Q6  Brovana + Pulmicort BID   Osteoarthritis P: Cont prednisone 5mg  QD (chronic dose)   Hx Chronic CHF, HTN Bradycardia improved   P: Cont Labetolol, amlodipine PRN hydralazine for SBP goal < 180 Holding diuresis (seems to be diuresing well on her own)  Non AG Metabolic acidosis due to Hyperchloremia - resolved Hyperkalemia - resolved  P: Cont free water 258mL q 8 hours. Trend BMP / urinary output  Leukocytosis - no indication of infection Fever - new 8/26-temp tr down.  S/p course of abx.  CSF negative. P: Monitor  Anemia - no signs of bleeding P: Follow CBC Consider transfusion for Hgb < 7  Best Practice GI: PPI  DVT: Hep  Feeding: TF   Family: Daughter updated at bedside   Georgann Housekeeper, AGACNP-BC [facility] Pager [phone] or [phone]  04/10/2018 8:36 AM

## 2018-04-10 NOTE — Progress Notes (Signed)
STROKE TEAM PROGRESS NOTE   INTERVAL HISTORY Her  daughter is at the bedside.  Patient  remains neurologically stable.. Blood pressure adequately controlled.    Ventriculostomy now clamped  Follow-up CT scan from this morning shows stable appearance of the intraventricular hemorrhage and cytotoxic edema.Plan is for elective tracheostomy on Friday a.m. By CCM team and removal of EVD today by neurosurgery  Vitals:   04/10/18 1000 04/10/18 1126 04/10/18 1200 04/10/18 1317  BP: 118/68     Pulse: (!) 54 (!) 56    Resp: 18 (!) 21    Temp:   99 F (37.2 C)   TempSrc:   Axillary   SpO2: 100% 100%  100%  Weight:      Height:        CBC:  Recent Labs  Lab 04/08/18 0432 04/09/18 0500  WBC 16.7* 17.5*  HGB 8.3* 7.6*  HCT 26.9* 25.6*  MCV 88.2 89.5  PLT 413* 432*    Basic Metabolic Panel:  Recent Labs  Lab 04/08/18 0432 04/09/18 0500  NA 136 137  K 4.2 4.3  CL 103 105  CO2 27 26  GLUCOSE 125* 115*  BUN 33* 30*  CREATININE 0.95 0.92  CALCIUM 8.3* 8.1*  MG  --  2.1  PHOS  --  3.4   Lipid Panel:     Component Value Date/Time   CHOL 177 03/30/2018 0235   TRIG 67 03/30/2018 2248   HDL 101 03/30/2018 0235   CHOLHDL 1.8 03/30/2018 0235   VLDL 10 03/30/2018 0235   LDLCALC 66 03/30/2018 0235   HgbA1c:  Lab Results  Component Value Date   HGBA1C 5.5 03/30/2018   Urine Drug Screen:     Component Value Date/Time   LABOPIA NONE DETECTED 03/29/2018 1110   COCAINSCRNUR NONE DETECTED 03/29/2018 1110   LABBENZ NONE DETECTED 03/29/2018 1110   AMPHETMU NONE DETECTED 03/29/2018 1110   THCU NONE DETECTED 03/29/2018 1110   LABBARB NONE DETECTED 03/29/2018 1110    Alcohol Level     Component Value Date/Time   ETH <10 03/27/2018 1354    IMAGING  Ct Angio Head W Or Wo Contrast 03/27/2018 IMPRESSION:  1. No vascular malformation or abnormal enhancement of the brain identified.  2. Stable cerebellar and intraventricular hemorrhage.  3. Patent anterior and posterior  intracranial circulation. No large vessel occlusion, aneurysm, or significant stenosis.  4. Calcific atherosclerosis of the carotid and vertebral arteries with mild stenosis.    Ct Head Wo Contrast 03/28/2018 IMPRESSION:  1. Stable volume of intracranial hemorrhage within the left cerebellar hemisphere and the ventricular system. Some redistribution of blood products to the lateral ventricle atria and sylvian fissures.  2. No new acute intracranial abnormality.  3. Interval right frontal approach ventriculostomy catheter placement. Mild decrease in hydrocephalus.    Ct Head Wo Contrast 03/27/2018 IMPRESSION:  2 cm hematoma in the deep left cerebellum with fourth ventricular extension extending into the third/lateral ventricles and into the dorsal cervical canal. There is mild hydrocephalus at the lateral ventricles when compared to 2016. A deep cerebellar origin favors hypertensive etiology.   CUS 1-39% ICA stenosis.  Vertebral artery flow is antegrade. Significant calcific plaque bilateral bifurcations, however, velocities are not significantly increased.   TTE - Left ventricle: The cavity size was normal. Wall thickness was   increased in a pattern of mild LVH. Systolic function was normal.   The estimated ejection fraction was in the range of 60% to 65%.   Doppler parameters are consistent  with both elevated ventricular   end-diastolic filling pressure and elevated left atrial filling   pressure. - Aortic valve: There was mild stenosis. Valve area (VTI): 1.88   cm^2. Valve area (Vmax): 1.82 cm^2. Valve area (Vmean): 1.59   cm^2. - Mitral valve: Calcified annulus. - Atrial septum: No defect or patent foramen ovale was identified.  Ct Head Wo Contrast  Result Date: 03/30/2018 CLINICAL DATA:  77 y/o F; follow-up for intracranial hemorrhage, intraventricular hemorrhage, and hydrocephalus. EXAM: CT HEAD WITHOUT CONTRAST TECHNIQUE: Contiguous axial images were obtained from the base of  the skull through the vertex without intravenous contrast. COMPARISON:  03/28/2018 and 03/27/2018 CT head. FINDINGS: Brain: Stable volume acute hemorrhage within the left medial cerebellar hemisphere, ventricular system, and subarachnoid spaces of the sylvian fissures. There is some interval redistribution, for example decreased hemorrhage within the frontal horns of lateral ventricles, and increased pooling in the occipital horns. No new acute intracranial hemorrhage, stroke, or mass effect. Right frontal approach ventriculostomy catheter is stable in position traversing the frontal horn of right lateral ventricle. Stable ventricle size. Vascular: Calcific atherosclerosis of carotid siphons and vertebral arteries. Skull: Postsurgical changes related to a right frontal approach ventriculostomy catheter with decreased edema in the scalp. Sinuses/Orbits: Intubated patient. Maxillary sinus mucous retention cysts. Normal aeration of mastoid air cells. Orbits are unremarkable. Other: None. IMPRESSION: Stable volume of intraventricular, subarachnoid, and left cerebellar hemorrhage. Stable ventricle size. No new acute intracranial abnormality. Electronically Signed   By: Kristine Garbe M.D.   On: 03/30/2018 05:12    Ct Head Wo Contrast  Result Date: 04/01/2018 CLINICAL DATA:  77 y/o  F; follow-up of intracranial hemorrhage. EXAM: CT HEAD WITHOUT CONTRAST TECHNIQUE: Contiguous axial images were obtained from the base of the skull through the vertex without intravenous contrast. COMPARISON:  03/30/2018 CT head.  09/24/2014 CT head. FINDINGS: Brain: Partial interval dispersion of intraventricular, subarachnoid, and left cerebellar brain parenchymal hemorrhage. No new acute intracranial hemorrhage, stroke, or focal mass effect. Decreased size of the lateral and third ventricles, ventricle size now similar to 2016 CT head. Stable position of right frontal approach ventriculostomy catheter with tip in the right  lateral ventricle near the foramen of Monro. Vascular: Calcific atherosclerosis of carotid siphons and vertebral arteries. No hyperdense vessel identified. Skull: Right frontal burr hole postsurgical changes. Torus palatini and maxillaris. Sinuses/Orbits: Endotracheal and enteric tubes. Left maxillary sinus small mucous retention cyst. Visualized paranasal sinuses and mastoid air cells are otherwise normally aerated. Orbits are unremarkable. Other: None. IMPRESSION: 1. Partial interval dispersion of intraventricular, subarachnoid, and left cerebellar brain parenchymal hemorrhage. 2. Resolution of hydrocephalus. Stable position of right frontal approach ventriculostomy catheter. 3. No new acute intracranial process. Electronically Signed   By: Kristine Garbe M.D.   On: 04/01/2018 05:26   Dg Chest Port 1 View  Result Date: 04/01/2018 CLINICAL DATA:  Ventilator support EXAM: PORTABLE CHEST 1 VIEW COMPARISON:  03/31/2018 FINDINGS: Endotracheal tube tip is 2.5 cm above the carina. Nasogastric tube enters the stomach. Atelectasis in both lower lobes appears similar. Upper lungs remain clear. IMPRESSION: Endotracheal tube and nasogastric tube unchanged in well-positioned. Persistent atelectasis in both lower lobes. Electronically Signed   By: Nelson Chimes M.D.   On: 04/01/2018 09:08   Ct Head Wo Contrast  Result Date: 04/02/2018 CLINICAL DATA:  Postop unresponsiveness. EXAM: CT HEAD WITHOUT CONTRAST TECHNIQUE: Contiguous axial images were obtained from the base of the skull through the vertex without intravenous contrast. COMPARISON:  Earlier today FINDINGS: Brain: Recurrent  lateral and third ventricular hydrocephalus with ballooning of the horns. Clot is newly seen along the EVD catheter, which is presumably dysfunctional. There is also increased edema around the catheter which is likely tracking CSF. No rebleeding is seen. There is stable left cerebellar, intraventricular, and subarachnoid clot.  Negative for infarct. Vascular: Negative Skull: Unremarkable burr hole for EVD.  Right parietal osteoma. Sinuses/Orbits: Negative Other: During reading, case discussed with RN Roselyn Reef and findings were expected. She is currently calling Dr Kathyrn Sheriff for orders. IMPRESSION: 1. Recurrent lateral and third ventricular hydrocephalus. Newly seen clot along the EVD, presumed cause of catheter dysfunction. 2. No interval bleeding. 3. Increased low-density along the EVD, likely tracking CSF. 4. No herniation or infarct. Electronically Signed   By: Monte Fantasia M.D.   On: 04/02/2018 13:14     PHYSICAL EXAM Elderly lady who is intubated  . She has a right frontal ventriculostomy catheter. . Afebrile. Head is nontraumatic. Neck is supple without bruit.    Cardiac exam no murmur or gallop. Lungs are clear to auscultation. Distal pulses are well felt. Neurological Exam :  Intubated, awake and alert  does  follow commands well on right side and gaze. Pupils are small 2 mm pinpoint and reactive to light. Eyes right gaze preference but able to look to the left and midline,  . Blinking to visual threat on the right but not on the left.   Motor system exam she is able to withdraw all 4 extremities with painful stimuli but does not follow commands, able to against gravity RUE 3+/5 and localizing to pain with RUE. RLE 3/5 on pain, but LUE 1/5 and LLEs 2+/5 on pain stimulation. Deep tendon reflexes are 1+ symmetric, but babinski positive bilaterally. Sensation, coordination and gait not tested.    ASSESSMENT/PLAN Amber Rogers is a 77 y.o. female with history of COPD, CHF, HTN, HLD, CKD III, CAD s/p stent presenting with sudden onset nausea, vomiting and dizziness.   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  Mild hydrocephalus lateral ventricles.    CTA head no vascular malformation or abnormal enhancement.  Stable cerebellar and IVH.  No LVO.  Intracranial atherosclerosis.  Repeat CT x 3 - Stable volume of intracranial hemorrhage and hydrocephalus  CUS - Significant calcific plaque bilateral bifurcations, however, velocities are not significantly increased.  2D Echo EF 60-65%  UDS neg  LDL 66  HgbA1c 5.5  Heparin subq for VTE prophylaxis  NPO on TF @ 50cc/h  aspirin 81 mg daily and Brilinta 90 mg twice daily prior to admission, now on no antithrombotics  Therapy recommendations:  Pending  Disposition:  pending   Encephalopathy   Depressed mental status even off sedation, however, following commands on 04/01/18 pm  Could be due to brainstem irritation with 4th IVH vs. Sleep wake cycle disturbance  AKI on CKD - Cre 1.36-1.33-1.35-1.43->1.57->1.25->1.12->1.11  Leukocytosis - WBC 14.0->16.2->24.8->19.2->15.3->12.4  fever Tmax 101.1->102.4->100.5->afebrile->102->100.4  CXR - bilateral basilar atelectasis  UA WBC 6-10  CSF WBC 46, RBC 8000, protein 84, glucose 73, culture negative  On  cefepime for empirical antibiotics.   Obstructive hydrocephalus s/p R frontal EVD   EVD placed 8/22, replaced 8/28  CT showed recurrent hydro prior to EVD  replacement  NSG on board  Currently EVD patent  Carotid stenosis   CUS 08/2017 R ICA stenosis 50-69%, L ICA < 50%  CUS repeat showed b/l ICA 1-39% stenosis but significant calcified plaque at carotid bifurcation bilaterally.   Acute Hypoxic Respiratory failure - extubation and re-intubation  Intubated in the ED with neuro decline   off sedation   Still depressed mental status - however intermittent lucid periods in the afternoon and following commands.   CCM on board  Extubated and re-intubated  04/03/18  Hypertensive Emergency  BP stable off cardene  Home Meds: Norvasc 10, resumed  increase labetalol to 200mg  tid  SBP goal < 160  Hyperlipidemia  Home meds:  ?? Lipitor 80   LDL 66  Consider to resume statin on discharge  Other Stroke Risk Factors  Advanced age  Former Cigarette smoker, quit 2 years ago  Coronary artery disease s/p stent - on ASA and brilinta PTA  Chronic Congestive heart failure  Other Active Problems  Hx vertigo   COPD - on home prednisone per tube  Leukocytosis - 14.9 ->14.0->14.3->16.2->24.8->19.2->15.3->12.4->16.7  CKD stage III - Creatinine - 1.35->1.43->1.57->1.25->1.12->1.11->1.16  Hospital day # 14 Plan breast cancer EVD today. strict blood pressure control   goal   systolic below 416.  Tracheostomy planned for tomorrow.Long discussion with Dr Lynetta Mare   at the bedside and answered questions This patient is critically ill due to cerebellar ICH with IVH and hydrocephalus, respiratory failure, hypertensive emergency and at significant risk of neurological worsening, death form recurrent bleeding, hydrocephalus, increased ICP, cerebral edema and brain death, seizure. This patient's care requires constant monitoring of vital signs, hemodynamics, respiratory and cardiac monitoring, review of multiple databases, neurological assessment, discussion with family, other specialists and medical decision making of high complexity. I spent 30 minutes of neurocritical care time in the care of this patient. I had long discussion with daughter at bedside, updated pt current condition, treatment plan and potential prognosis. Amber Contras, Amber Rogers Stroke Neurology 04/10/2018 2:47 PM    To contact Stroke Continuity provider, please refer to [email or web address] After hours, contact General Neurology

## 2018-04-10 NOTE — Progress Notes (Signed)
  NEUROSURGERY PROGRESS NOTE   Pt seen and examined. No issues overnight.   EXAM: Temp:  [97.5 F (36.4 C)-100.6 F (38.1 C)] 99 F (37.2 C) (09/05 1200) Pulse Rate:  [54-71] 56 (09/05 1126) Resp:  [15-26] 21 (09/05 1126) BP: (98-167)/(51-126) 118/68 (09/05 1000) SpO2:  [99 %-100 %] 100 % (09/05 1126) FiO2 (%):  [30 %-40 %] 40 % (09/05 1126) Weight:  [67.5 kg] 67.5 kg (09/05 0443) Intake/Output      09/04 0701 - 09/05 0700 09/05 0701 - 09/06 0700   I.V. (mL/kg) 713.1 (10.6) 75 (1.1)   NG/GT 250 150   Total Intake(mL/kg) 963.1 (14.3) 225 (3.3)   Urine (mL/kg/hr) 3050 (1.9) 325 (0.8)   Drains     Total Output 3050 325   Net -2086.9 -100         Awake, alert Responding to questions Briskly following commands EVD in place, clamped  LABS: Lab Results  Component Value Date   CREATININE 0.92 04/09/2018   BUN 30 (H) 04/09/2018   NA 137 04/09/2018   K 4.3 04/09/2018   CL 105 04/09/2018   CO2 26 04/09/2018   Lab Results  Component Value Date   WBC 17.5 (H) 04/09/2018   HGB 7.6 (L) 04/09/2018   HCT 25.6 (L) 04/09/2018   MCV 89.5 04/09/2018   PLT 432 (H) 04/09/2018    IMAGING: CTH this am reviewed and compared to 9/3. Stable mild HCP. No new findings.  IMPRESSION: - 77 y.o. female with pos fossa hemorrhage. Stable neurologic exam and stable CT with mild HCP suggesting she is not shunt dependent.  PLAN: - Will d/c EVD today - Cont current mgmt per PCCM/neurology

## 2018-04-10 NOTE — Progress Notes (Signed)
RT Note: RT checked on patient and noticed she developed stridor that was not present after extubation. RT immediately gave patient racemic epi. RT notified RN who called MD. MD is aware that patient now has stridor and told RN to monitor and place patient on bipap if needed. Patient's vitals are stable and patient is sating in the high 90's.   Chest PT not done at this time.

## 2018-04-10 NOTE — Procedures (Signed)
Extubation Procedure Note  Patient Details:   Name: MARIEM SKOLNICK DOB: 06/11/1941 MRN: [id number]   Airway Documentation:    Vent end date: 04/10/18 Vent end time: 1500   Evaluation  O2 sats: stable throughout Complications: No apparent complications Patient did tolerate procedure well. Bilateral Breath Sounds: Clear, Diminished   Yes   RT extubated patient to 2L [place] per MD order with RN at bedside. Positive cuff leak noted. Patient is able speak and no stridor noted. Patient is currently sating 100%. RT will continue to monitor.   Vernona Rieger 04/10/2018, 3:03 PM

## 2018-04-10 NOTE — Progress Notes (Signed)
RT note: RT advanced ETT 2cm per order.

## 2018-04-10 NOTE — Progress Notes (Signed)
Neurosurgery Progress Note  No NS issues overnight. Patient underwent repeat head CT this am. Remains intubated  EXAM:  BP 118/68   Pulse (!) 56   Temp 99 F (37.2 C) (Axillary)   Resp (!) 21   Ht 5\' 6"  (1.676 m)   Wt 67.5 kg   SpO2 100%   BMI 24.02 kg/m   Intubated Briskly follows commands No focal deficit  PLAN Stable neurologically this am EVD clamped x48 hours. Repeat head CT looks stable. Continues to follow commands briskly. EVD removed today without complication in clean fashion. 2 staples were applied to opening where EVD exited scalp in clean fashion. Pt tolerated well. No drainage seen.  NSY will sign off. Please call for any concerns.

## 2018-04-11 ENCOUNTER — Inpatient Hospital Stay (HOSPITAL_COMMUNITY): Payer: Medicare Other

## 2018-04-11 LAB — GLUCOSE, CAPILLARY
Glucose-Capillary: 107 mg/dL — ABNORMAL HIGH (ref 70–99)
Glucose-Capillary: 114 mg/dL — ABNORMAL HIGH (ref 70–99)
Glucose-Capillary: 115 mg/dL — ABNORMAL HIGH (ref 70–99)
Glucose-Capillary: 126 mg/dL — ABNORMAL HIGH (ref 70–99)
Glucose-Capillary: 90 mg/dL (ref 70–99)
Glucose-Capillary: 90 mg/dL (ref 70–99)

## 2018-04-11 LAB — BASIC METABOLIC PANEL
ANION GAP: 11 (ref 5–15)
BUN: 30 mg/dL — AB (ref 8–23)
CHLORIDE: 105 mmol/L (ref 98–111)
CO2: 23 mmol/L (ref 22–32)
Calcium: 8.4 mg/dL — ABNORMAL LOW (ref 8.9–10.3)
Creatinine, Ser: 0.86 mg/dL (ref 0.44–1.00)
GFR calc non Af Amer: >60 mL/min (ref >60)
Glucose, Bld: 120 mg/dL — ABNORMAL HIGH (ref 70–99)
POTASSIUM: 4.3 mmol/L (ref 3.5–5.1)
SODIUM: 139 mmol/L (ref 135–145)

## 2018-04-11 LAB — CBC
HCT: 26 % — ABNORMAL LOW (ref 36.0–46.0)
HEMOGLOBIN: 7.8 g/dL — AB (ref 12.0–15.0)
MCH: 26.5 pg (ref 26.0–34.0)
MCHC: 30 g/dL (ref 30.0–36.0)
MCV: 88.4 fL (ref 78.0–100.0)
PLATELETS: 490 10*3/uL — AB (ref 150–400)
RBC: 2.94 MIL/uL — AB (ref 3.87–5.11)
RDW: 15.4 % (ref 11.5–15.5)
WBC: 15.5 10*3/uL — AB (ref 4.0–10.5)

## 2018-04-11 LAB — PHOSPHORUS: PHOSPHORUS: 3.9 mg/dL (ref 2.5–4.6)

## 2018-04-11 LAB — PROTIME-INR
INR: 1.06
Prothrombin Time: 13.7 seconds (ref 11.4–15.2)

## 2018-04-11 LAB — MAGNESIUM: MAGNESIUM: 2.2 mg/dL (ref 1.7–2.4)

## 2018-04-11 MED ORDER — PROPOFOL 1000 MG/100ML IV EMUL
INTRAVENOUS | Status: AC
  Filled 2018-04-11: qty 100

## 2018-04-11 MED ORDER — NICARDIPINE HCL IN NACL 20-0.86 MG/200ML-% IV SOLN
INTRAVENOUS | Status: AC
  Administered 2018-04-11: 15 mg
  Filled 2018-04-11: qty 200

## 2018-04-11 MED ORDER — ETOMIDATE 2 MG/ML IV SOLN
INTRAVENOUS | Status: AC
  Administered 2018-04-11: 20 mg
  Filled 2018-04-11: qty 10

## 2018-04-11 MED ORDER — CHLORHEXIDINE GLUCONATE 0.12 % MT SOLN
15.0000 mL | Freq: Two times a day (BID) | OROMUCOSAL | Status: DC
  Administered 2018-04-12 – 2018-04-24 (×22): 15 mL via OROMUCOSAL
  Filled 2018-04-11 (×19): qty 15

## 2018-04-11 MED ORDER — ORAL CARE MOUTH RINSE
15.0000 mL | Freq: Two times a day (BID) | OROMUCOSAL | Status: DC
  Administered 2018-04-12 – 2018-04-23 (×20): 15 mL via OROMUCOSAL

## 2018-04-11 MED ORDER — VITAL AF 1.2 CAL PO LIQD
237.0000 mL | Freq: Three times a day (TID) | ORAL | Status: DC
  Administered 2018-04-11 – 2018-04-15 (×15): 237 mL
  Filled 2018-04-11 (×19): qty 237

## 2018-04-11 NOTE — Progress Notes (Signed)
Cortrak Tube Team Note:  Consult received to place a Cortrak feeding tube.   A 10 F Cortrak tube was placed in the LEFT nare and secured with a nasal bridle at 71 cm. Per the Cortrak monitor reading the tube tip is gastric/pyloric region.   No x-ray is required. RN may begin using tube.   If the tube becomes dislodged please keep the tube and contact the Cortrak team at [email or web address] ([email or web address]) for replacement.  If after hours and replacement cannot be delayed, place a NG tube and confirm placement with an abdominal x-ray.   Kerman Passey MS, RD, [place], [place] [phone] Pager  [phone] Weekend/On-Call Pager

## 2018-04-11 NOTE — Progress Notes (Signed)
OT Cancellation Note  Patient Details Name: Amber Rogers MRN: [id number] DOB: 1941/02/03   Cancelled Treatment:    Reason Eval/Treat Not Completed: Medical issues which prohibited therapy(elective tracheostomy). Will check back later as schedule allows  Jaci Carrel 04/11/2018, 8:55 AM  Hulda Humphrey OTR/L [place] Pager: [phone] Office: [phone]

## 2018-04-11 NOTE — Progress Notes (Signed)
[facility] Progress Note Patient Name: Amber Rogers DOB: 09/28/1940 MRN: [id number]   Date of Service  04/11/2018  HPI/Events of Note    eICU Interventions  Restraint orders renewed        Frederik Pear 04/11/2018, 7:59 PM

## 2018-04-11 NOTE — Procedures (Signed)
Bedside Tracheostomy Insertion Procedure Note   Patient Details:   Name: Amber Rogers DOB: Mar 03, 1941 MRN: [id number]  Procedure: Tracheostomy  Pre Procedure Assessment: ET Tube Size: 7.5 ET Tube secured at lip (cm): 20 Bite block in place: No Breath Sounds: Clear  Post Procedure Assessment: BP (!) 164/65   Pulse (!) 57   Temp 97.9 F (36.6 C) (Axillary)   Resp 16   Ht 5\' 6"  (1.676 m)   Wt 67.4 kg   SpO2 100%   BMI 23.98 kg/m  O2 sats: stable throughout Complications: No apparent complications Patient did tolerate procedure well Tracheostomy Brand:Shiley Tracheostomy Style:Cuffed Tracheostomy Size: 6 Tracheostomy Secured KFM:MCRFVOH, velcro Tracheostomy Placement Confirmation:Trach cuff visualized and in place and Chest X ray ordered for placement    Kathie Dike 04/11/2018, 9:45 AM

## 2018-04-11 NOTE — Progress Notes (Signed)
Occupational Therapy Treatment Patient Details Name: Amber Rogers MRN: [id number] DOB: 1941/07/30 Today's Date: 04/11/2018    History of present illness 77 year old female with PMH of HTN, CKD, CHF (EF 55% and grade 2 DD in 2016), and COPD. She presented to [facility] ED 8/22 with complaints of headache, nausea, and vomiting with onset of symptoms around 1200. She was found to be hypertensive. CT scan demonstrated hematoma in the left cerebellum with early hydrocephalus. Initially no neurosurgical intervention was indicated due to her reasonable mental status and dual antiplatelet regimen, however, her condition continued to decline and she required intubation. She was then deemed a candidate for EVD placement.   OT comments  Pt demonstrating improved movement in RUE, and participation for mod +2 assist stand pivot transfer to the recliner. Sat EOB for grooming task hand over hand to work on balance. Current POC remains appropriate. OT will continue to follow acutely. Next session to do more visual assessment.   Follow Up Recommendations  SNF    Equipment Recommendations  3 in 1 bedside commode;Wheelchair (measurements OT);Wheelchair cushion (measurements OT);Hospital bed    Recommendations for Other Services      Precautions / Restrictions Precautions Precautions: Fall;Other (comment) Precaution Comments: trach on vent       Mobility Bed Mobility Overal bed mobility: Needs Assistance Bed Mobility: Rolling;Sidelying to Sit Rolling: Max assist Sidelying to sit: Max assist       General bed mobility comments: pt still needing 2 person stability assist due to dizziness and anxiety about falling once during the transition to sitting.  Transfers Overall transfer level: Needs assistance   Transfers: Sit to/from Stand;Stand Pivot Transfers Sit to Stand: Mod assist;+2 physical assistance Stand pivot transfers: Mod assist;+2 physical assistance       General transfer comment: pt  able to come forward with assist and boost to full stand with relative ease compared to the last treatment.  Pt was stabilized while she pivoted to the chair.    Balance Overall balance assessment: Needs assistance Sitting-balance support: Single extremity supported;Bilateral upper extremity supported Sitting balance-Leahy Scale: Poor(after 7-8 min pt finally able to sit EOB with min guard ) Sitting balance - Comments: Initially heavy posterior lean with cues/ assist helping to get pt forward and upright at EOB   Standing balance support: Single extremity supported;Bilateral upper extremity supported Standing balance-Leahy Scale: Poor Standing balance comment: stood and pivoted to chair.  Maintained upright standing for more than 20 seconds, but with mild to moderate posterior lean.                           ADL either performed or assessed with clinical judgement   ADL Overall ADL's : Needs assistance/impaired     Grooming: Moderate assistance;Wash/dry face;Sitting Grooming Details (indicate cue type and reason): hand over hand for safety with vent and new trach                 Toilet Transfer: Moderate assistance;+2 for physical assistance;+2 for safety/equipment;Stand-pivot Toilet Transfer Details (indicate cue type and reason): simulated through recliner transfer [place] and Hygiene: Total assistance               Vision       Perception     Praxis      Cognition Arousal/Alertness: Awake/alert Behavior During Therapy: Anxious;WFL for tasks assessed/performed Overall Cognitive Status: Difficult to assess  Exercises     Shoulder Instructions       General Comments VSS, BP running soft: 114/77 (laying), 120/55(sitting EOB), 101/43 (sitting in recliner), 102/53 (sitting in recliner after 3 min)    Pertinent Vitals/ Pain       Pain Assessment: Faces Faces Pain  Scale: Hurts a little bit Pain Location: generalized Pain Descriptors / Indicators: Grimacing Pain Intervention(s): Monitored during session;Repositioned  Home Living                                          Prior Functioning/Environment              Frequency  Min 2X/week        Progress Toward Goals  OT Goals(current goals can now be found in the care plan section)  Progress towards OT goals: Progressing toward goals  Acute Rehab OT Goals Patient Stated Goal: none stated trach OT Goal Formulation: Patient unable to participate in goal setting Time For Goal Achievement: 04/23/18 Potential to Achieve Goals: Good  Plan Discharge plan remains appropriate;Frequency remains appropriate    Co-evaluation    PT/OT/SLP Co-Evaluation/Treatment: Yes Reason for Co-Treatment: Complexity of the patient's impairments (multi-system involvement);For patient/therapist safety;To address functional/ADL transfers PT goals addressed during session: Mobility/safety with mobility;Balance OT goals addressed during session: ADL's and self-care      AM-PAC PT "6 Clicks" Daily Activity     Outcome Measure   Help from another person eating meals?: Total Help from another person taking care of personal grooming?: Total Help from another person toileting, which includes using toliet, bedpan, or urinal?: Total Help from another person bathing (including washing, rinsing, drying)?: Total Help from another person to put on and taking off regular upper body clothing?: Total Help from another person to put on and taking off regular lower body clothing?: Total 6 Click Score: 6    End of Session Equipment Utilized During Treatment: Oxygen  OT Visit Diagnosis: Unsteadiness on feet (R26.81);Muscle weakness (generalized) (M62.81);Pain;Cognitive communication deficit (R41.841)   Activity Tolerance Patient tolerated treatment well   Patient Left in chair;with call bell/phone  within reach;with family/visitor present;with restraints reapplied   Nurse Communication Mobility status;Need for lift equipment;Precautions        Time: 1610-9604 OT Time Calculation (min): 28 min  Charges: OT General Charges $OT Visit: 1 Visit OT Treatments $Self Care/Home Management : 8-22 mins  Hulda Humphrey OTR/L Acute Rehabilitation Services Pager: [phone] Office: [place] 04/11/2018, 7:00 PM

## 2018-04-11 NOTE — Progress Notes (Signed)
Holding patient's scheduled chest PT due to patient just receiving fresh trach. RT will resume chest PT at next scheduled time. RT will continue to monitor.

## 2018-04-11 NOTE — Progress Notes (Signed)
STROKE TEAM PROGRESS NOTE   INTERVAL HISTORY Patient is having bedside tracheostomy this morning..  Patient  remains neurologically stable.. Blood pressure adequately controlled.    Ventriculostomy was discontinued yesterday   Patient was given another trial of extubation yesterday but did not tolerate it  and developed stridor requiring reintubation  Vitals:   04/11/18 1500 04/11/18 1517 04/11/18 1530 04/11/18 1548  BP: (!) 131/52     Pulse: (!) 58  60   Resp: 20  (!) 21   Temp:      TempSrc:      SpO2: 100% 100% 100% 100%  Weight:      Height:        CBC:  Recent Labs  Lab 04/09/18 0500 04/11/18 0613  WBC 17.5* 15.5*  HGB 7.6* 7.8*  HCT 25.6* 26.0*  MCV 89.5 88.4  PLT 432* 490*    Basic Metabolic Panel:  Recent Labs  Lab 04/09/18 0500 04/11/18 0613  NA 137 139  K 4.3 4.3  CL 105 105  CO2 26 23  GLUCOSE 115* 120*  BUN 30* 30*  CREATININE 0.92 0.86  CALCIUM 8.1* 8.4*  MG 2.1 2.2  PHOS 3.4 3.9   Lipid Panel:     Component Value Date/Time   CHOL 177 03/30/2018 0235   TRIG 67 03/30/2018 2248   HDL 101 03/30/2018 0235   CHOLHDL 1.8 03/30/2018 0235   VLDL 10 03/30/2018 0235   LDLCALC 66 03/30/2018 0235   HgbA1c:  Lab Results  Component Value Date   HGBA1C 5.5 03/30/2018   Urine Drug Screen:     Component Value Date/Time   LABOPIA NONE DETECTED 03/29/2018 1110   COCAINSCRNUR NONE DETECTED 03/29/2018 1110   LABBENZ NONE DETECTED 03/29/2018 1110   AMPHETMU NONE DETECTED 03/29/2018 1110   THCU NONE DETECTED 03/29/2018 1110   LABBARB NONE DETECTED 03/29/2018 1110    Alcohol Level     Component Value Date/Time   ETH <10 03/27/2018 1354    IMAGING  Ct Angio Head W Or Wo Contrast 03/27/2018 IMPRESSION:  1. No vascular malformation or abnormal enhancement of the brain identified.  2. Stable cerebellar and intraventricular hemorrhage.  3. Patent anterior and posterior intracranial circulation. No large vessel occlusion, aneurysm, or significant  stenosis.  4. Calcific atherosclerosis of the carotid and vertebral arteries with mild stenosis.    Ct Head Wo Contrast 03/28/2018 IMPRESSION:  1. Stable volume of intracranial hemorrhage within the left cerebellar hemisphere and the ventricular system. Some redistribution of blood products to the lateral ventricle atria and sylvian fissures.  2. No new acute intracranial abnormality.  3. Interval right frontal approach ventriculostomy catheter placement. Mild decrease in hydrocephalus.    Ct Head Wo Contrast 03/27/2018 IMPRESSION:  2 cm hematoma in the deep left cerebellum with fourth ventricular extension extending into the third/lateral ventricles and into the dorsal cervical canal. There is mild hydrocephalus at the lateral ventricles when compared to 2016. A deep cerebellar origin favors hypertensive etiology.   CUS 1-39% ICA stenosis.  Vertebral artery flow is antegrade. Significant calcific plaque bilateral bifurcations, however, velocities are not significantly increased.   TTE - Left ventricle: The cavity size was normal. Wall thickness was   increased in a pattern of mild LVH. Systolic function was normal.   The estimated ejection fraction was in the range of 60% to 65%.   Doppler parameters are consistent with both elevated ventricular   end-diastolic filling pressure and elevated left atrial filling   pressure. - Aortic  valve: There was mild stenosis. Valve area (VTI): 1.88   cm^2. Valve area (Vmax): 1.82 cm^2. Valve area (Vmean): 1.59   cm^2. - Mitral valve: Calcified annulus. - Atrial septum: No defect or patent foramen ovale was identified.  Ct Head Wo Contrast  Result Date: 03/30/2018 CLINICAL DATA:  77 y/o F; follow-up for intracranial hemorrhage, intraventricular hemorrhage, and hydrocephalus. EXAM: CT HEAD WITHOUT CONTRAST TECHNIQUE: Contiguous axial images were obtained from the base of the skull through the vertex without intravenous contrast. COMPARISON:   03/28/2018 and 03/27/2018 CT head. FINDINGS: Brain: Stable volume acute hemorrhage within the left medial cerebellar hemisphere, ventricular system, and subarachnoid spaces of the sylvian fissures. There is some interval redistribution, for example decreased hemorrhage within the frontal horns of lateral ventricles, and increased pooling in the occipital horns. No new acute intracranial hemorrhage, stroke, or mass effect. Right frontal approach ventriculostomy catheter is stable in position traversing the frontal horn of right lateral ventricle. Stable ventricle size. Vascular: Calcific atherosclerosis of carotid siphons and vertebral arteries. Skull: Postsurgical changes related to a right frontal approach ventriculostomy catheter with decreased edema in the scalp. Sinuses/Orbits: Intubated patient. Maxillary sinus mucous retention cysts. Normal aeration of mastoid air cells. Orbits are unremarkable. Other: None. IMPRESSION: Stable volume of intraventricular, subarachnoid, and left cerebellar hemorrhage. Stable ventricle size. No new acute intracranial abnormality. Electronically Signed   By: Kristine Garbe M.D.   On: 03/30/2018 05:12    Ct Head Wo Contrast  Result Date: 04/01/2018 CLINICAL DATA:  77 y/o  F; follow-up of intracranial hemorrhage. EXAM: CT HEAD WITHOUT CONTRAST TECHNIQUE: Contiguous axial images were obtained from the base of the skull through the vertex without intravenous contrast. COMPARISON:  03/30/2018 CT head.  09/24/2014 CT head. FINDINGS: Brain: Partial interval dispersion of intraventricular, subarachnoid, and left cerebellar brain parenchymal hemorrhage. No new acute intracranial hemorrhage, stroke, or focal mass effect. Decreased size of the lateral and third ventricles, ventricle size now similar to 2016 CT head. Stable position of right frontal approach ventriculostomy catheter with tip in the right lateral ventricle near the foramen of [place]ro. Vascular: Calcific  atherosclerosis of carotid siphons and vertebral arteries. No hyperdense vessel identified. Skull: Right frontal burr hole postsurgical changes. Torus palatini and maxillaris. Sinuses/Orbits: Endotracheal and enteric tubes. Left maxillary sinus small mucous retention cyst. Visualized paranasal sinuses and mastoid air cells are otherwise normally aerated. Orbits are unremarkable. Other: None. IMPRESSION: 1. Partial interval dispersion of intraventricular, subarachnoid, and left cerebellar brain parenchymal hemorrhage. 2. Resolution of hydrocephalus. Stable position of right frontal approach ventriculostomy catheter. 3. No new acute intracranial process. Electronically Signed   By: Kristine Garbe M.D.   On: 04/01/2018 05:26   Dg Chest Port 1 View  Result Date: 04/01/2018 CLINICAL DATA:  Ventilator support EXAM: PORTABLE CHEST 1 VIEW COMPARISON:  03/31/2018 FINDINGS: Endotracheal tube tip is 2.5 cm above the carina. Nasogastric tube enters the stomach. Atelectasis in both lower lobes appears similar. Upper lungs remain clear. IMPRESSION: Endotracheal tube and nasogastric tube unchanged in well-positioned. Persistent atelectasis in both lower lobes. Electronically Signed   By: Nelson Chimes M.D.   On: 04/01/2018 09:08   Ct Head Wo Contrast  Result Date: 04/02/2018 CLINICAL DATA:  Postop unresponsiveness. EXAM: CT HEAD WITHOUT CONTRAST TECHNIQUE: Contiguous axial images were obtained from the base of the skull through the vertex without intravenous contrast. COMPARISON:  Earlier today FINDINGS: Brain: Recurrent lateral and third ventricular hydrocephalus with ballooning of the horns. Clot is newly seen along the EVD catheter, which  is presumably dysfunctional. There is also increased edema around the catheter which is likely tracking CSF. No rebleeding is seen. There is stable left cerebellar, intraventricular, and subarachnoid clot. Negative for infarct. Vascular: Negative Skull: Unremarkable burr hole  for EVD.  Right parietal osteoma. Sinuses/Orbits: Negative Other: During reading, case discussed with RN Roselyn Reef and findings were expected. She is currently calling Dr Kathyrn Sheriff for orders. IMPRESSION: 1. Recurrent lateral and third ventricular hydrocephalus. Newly seen clot along the EVD, presumed cause of catheter dysfunction. 2. No interval bleeding. 3. Increased low-density along the EVD, likely tracking CSF. 4. No herniation or infarct. Electronically Signed   By: Monte Fantasia M.D.   On: 04/02/2018 13:14     PHYSICAL EXAM Elderly lady who is intubated  .   Marland Kitchen Afebrile. Head is nontraumatic. Neck is supple without bruit.    Cardiac exam no murmur or gallop. Lungs are clear to auscultation. Distal pulses are well felt. Neurological Exam :  Intubated, sedated for elective tracheostomy hence exam deferred   ASSESSMENT/PLAN Amber Rogers is a 77 y.o. female with history of COPD, CHF, HTN, HLD, CKD III, CAD s/p stent presenting with sudden onset nausea, vomiting and dizziness.   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  Mild hydrocephalus lateral ventricles.    CTA head no vascular malformation or abnormal enhancement.  Stable cerebellar and IVH.  No LVO.  Intracranial atherosclerosis.  Repeat CT x 3 - Stable volume of intracranial hemorrhage and hydrocephalus  CUS - Significant calcific plaque bilateral bifurcations, however, velocities are not significantly increased.  2D Echo EF 60-65%  UDS neg  LDL 66  HgbA1c 5.5  Heparin subq for VTE prophylaxis  NPO on TF @ 50cc/h  aspirin 81 mg daily and Brilinta 90 mg twice daily prior to admission, now on no  antithrombotics  Therapy recommendations:  Pending  Disposition:  pending   Encephalopathy   Depressed mental status even off sedation, however, following commands on 04/01/18 pm  Could be due to brainstem irritation with 4th IVH vs. Sleep wake cycle disturbance  AKI on CKD - Cre 1.36-1.33-1.35-1.43->1.57->1.25->1.12->1.11  Leukocytosis - WBC 14.0->16.2->24.8->19.2->15.3->12.4  fever Tmax 101.1->102.4->100.5->afebrile->102->100.4  CXR - bilateral basilar atelectasis  UA WBC 6-10  CSF WBC 46, RBC 8000, protein 84, glucose 73, culture negative  On  cefepime for empirical antibiotics.   Obstructive hydrocephalus s/p R frontal EVD   EVD placed 8/22, replaced 8/28  CT showed recurrent hydro prior to EVD replacement  NSG on board  Currently EVD patent  Carotid stenosis   CUS 08/2017 R ICA stenosis 50-69%, L ICA < 50%  CUS repeat showed b/l ICA 1-39% stenosis but significant calcified plaque at carotid bifurcation bilaterally.   Acute Hypoxic Respiratory failure - extubation and re-intubation  Intubated in the ED with neuro decline   off sedation   Still depressed mental status - however intermittent lucid periods in the afternoon and following commands.   CCM on board  Extubated and re-intubated 04/03/18  Hypertensive Emergency  BP stable off cardene  Home Meds: Norvasc 10, resumed  increase labetalol to 200mg  tid  SBP goal < 160  Hyperlipidemia  Home meds:  ?? Lipitor 80   LDL 66  Consider to resume  statin on discharge  Other Stroke Risk Factors  Advanced age  Former Cigarette smoker, quit 2 years ago  Coronary artery disease s/p stent - on ASA and brilinta PTA  Chronic Congestive heart failure  Other Active Problems  Hx vertigo   COPD - on home prednisone per tube  Leukocytosis - 14.9 ->14.0->14.3->16.2->24.8->19.2->15.3->12.4->16.7  CKD stage III - Creatinine - 1.35->1.43->1.57->1.25->1.12->1.11->1.16  Hospital day # 15 Plan  elective tracheostomy today. strict blood pressure control   goal   systolic below 683.   Long discussion with Dr Lynetta Mare   at the bedside and answered questions This patient is critically ill due to cerebellar ICH with IVH and hydrocephalus, respiratory failure, hypertensive emergency and at significant risk of neurological worsening, death form recurrent bleeding, hydrocephalus, increased ICP, cerebral edema and brain death, seizure. This patient's care requires constant monitoring of vital signs, hemodynamics, respiratory and cardiac monitoring, review of multiple databases, neurological assessment, discussion with family, other specialists and medical decision making of high complexity. I spent 30 minutes of neurocritical care time in the care of this patient. I had long discussion with daughter at bedside, updated pt current condition, treatment plan and potential prognosis. Amber Contras, MD Stroke Neurology 04/11/2018 4:21 PM    To contact Stroke Continuity provider, please refer to [email or web address] After hours, contact General Neurology

## 2018-04-11 NOTE — Procedures (Signed)
Bronchoscopy  for Percutaneous  Tracheostomy  Name: SHERLIN SONIER MRN: [id number] DOB: 07-20-1941 Procedure: Bronchoscopy for Percutaneous Tracheostomy Indications: Diagnostic evaluation of the airways  In conjunction with: Dr.   Procedure Details Consent: Risks of procedure as well as the alternatives and risks of each were explained to the (patient/caregiver).  Consent for procedure obtained. Time Out: Verified patient identification, verified procedure, site/side was marked, verified correct patient position, special equipment/implants available, medications/allergies/relevent history reviewed, required imaging and test results available.  Performed  In preparation for procedure, patient was given 100% FiO2 and bronchoscope lubricated. Sedation: Benzodiazepines and Etomidate, fentanyl, vecuronium, propofol infusion  Airway entered and trachea examined.  Procedures performed: Endotracheal Tube retracted in 2 cm increments. Cannulation of airway observed. Dilation observed. Placement of trachel tube  observed . No overt complications. No damage to posterior wall of trachea. Verified Trach placement with bronchoscope.  Bronchoscope removed.    Evaluation Hemodynamic Status: BP stable throughout; O2 sats: stable throughout Patient's Current Condition: stable Specimens:  None Complications: No apparent complications Patient did tolerate procedure well.  Georgann Housekeeper, AGACNP-BC [facility] Pulmonology/Critical Care Pager [phone] or [phone]  04/11/2018 9:43 AM

## 2018-04-11 NOTE — Progress Notes (Signed)
Physical Therapy Treatment Patient Details Name: Amber Rogers MRN: [id number] DOB: 1940/12/21 Today's Date: 04/11/2018    History of Present Illness 77 year old female with PMH of HTN, CKD, CHF (EF 55% and grade 2 DD in 2016), and COPD. She presented to [facility] ED 8/22 with complaints of headache, nausea, and vomiting with onset of symptoms around 1200. She was found to be hypertensive. CT scan demonstrated hematoma in the left cerebellum with early hydrocephalus. Initially no neurosurgical intervention was indicated due to her reasonable mental status and dual antiplatelet regimen, however, her condition continued to decline and she required intubation. She was then deemed a candidate for EVD placement.    PT Comments    Improvements over past 2 sessions.  Pt appears much more comfortable with the trach and eager to try OOB.  Emphasis on rolling, transition to sitting EOB, sitting EOB to work on balance, sit to stand and pivot to chair.  Follow Up Recommendations  CIR     Equipment Recommendations  Other (comment)(TBA)    Recommendations for Other Services       Precautions / Restrictions Precautions Precautions: Fall;Other (comment) Precaution Comments: trach on vent    Mobility  Bed Mobility Overal bed mobility: Needs Assistance Bed Mobility: Rolling;Sidelying to Sit Rolling: Max assist Sidelying to sit: Max assist       General bed mobility comments: pt still needing 2 person stability assist due to dizziness and anxiety about falling once during the transition to sitting.  Transfers Overall transfer level: Needs assistance   Transfers: Sit to/from Stand;Stand Pivot Transfers Sit to Stand: Mod assist;+2 physical assistance Stand pivot transfers: Mod assist;+2 physical assistance       General transfer comment: pt able to come forward with assist and boost to full stand with relative ease compared to the last treatment.  Pt was stabilized while she pivoted to the  chair.  Ambulation/Gait                 Stairs             Wheelchair Mobility    Modified Rankin (Stroke Patients Only) Modified Rankin (Stroke Patients Only) Pre-Morbid Rankin Score: No symptoms Modified Rankin: Moderately severe disability     Balance Overall balance assessment: Needs assistance Sitting-balance support: Single extremity supported;Bilateral upper extremity supported Sitting balance-Leahy Scale: Poor(after 7-8 min pt finally able to sit EOB with min guard ) Sitting balance - Comments: Initially heavy posterior lean with cues/ assist helping to get pt forward and upright at EOB   Standing balance support: Single extremity supported;Bilateral upper extremity supported Standing balance-Leahy Scale: Poor Standing balance comment: stood and pivoted to chair.  Maintained upright standing for more than 20 seconds, but with mild to moderate posterior lean.                            Cognition Arousal/Alertness: Awake/alert Behavior During Therapy: Anxious;WFL for tasks assessed/performed Overall Cognitive Status: Difficult to assess                                        Exercises      General Comments General comments (skin integrity, edema, etc.): vss,        Pertinent Vitals/Pain Pain Assessment: Faces Faces Pain Scale: Hurts a little bit Pain Location: generalized Pain Descriptors / Indicators: Grimacing  Pain Intervention(s): Monitored during session    Home Living                      Prior Function            PT Goals (current goals can now be found in the care plan section) Acute Rehab PT Goals PT Goal Formulation: Patient unable to participate in goal setting Time For Goal Achievement: 04/22/18 Potential to Achieve Goals: Fair Progress towards PT goals: Progressing toward goals    Frequency    Min 3X/week      PT Plan Current plan remains appropriate    Co-evaluation PT/OT/SLP  Co-Evaluation/Treatment: Yes Reason for Co-Treatment: Complexity of the patient's impairments (multi-system involvement);For patient/therapist safety PT goals addressed during session: Mobility/safety with mobility OT goals addressed during session: ADL's and self-care;Strengthening/ROM      AM-PAC PT "6 Clicks" Daily Activity  Outcome Measure  Difficulty turning over in bed (including adjusting bedclothes, sheets and blankets)?: Unable Difficulty moving from lying on back to sitting on the side of the bed? : Unable Difficulty sitting down on and standing up from a chair with arms (e.g., wheelchair, bedside commode, etc,.)?: Unable Help needed moving to and from a bed to chair (including a wheelchair)?: A Lot Help needed walking in hospital room?: Total Help needed climbing 3-5 steps with a railing? : Total 6 Click Score: 7    End of Session Equipment Utilized During Treatment: Oxygen Activity Tolerance: Patient tolerated treatment well;Other (comment) Patient left: in chair;with call bell/phone within reach;with family/visitor present Nurse Communication: Mobility status PT Visit Diagnosis: Other abnormalities of gait and mobility (R26.89);Other symptoms and signs involving the nervous system (R29.898)     Time: 6440-3474 PT Time Calculation (min) (ACUTE ONLY): 28 min  Charges:  $Therapeutic Activity: 8-22 mins                     04/11/2018  [name], PT [phone]  (pager)   Amber Rogers 04/11/2018, 5:24 PM

## 2018-04-11 NOTE — Progress Notes (Signed)
PULMONARY / CRITICAL CARE MEDICINE   Name: Amber Rogers MRN: [id number] DOB: [birth year]    ADMISSION DATE:  03/27/2018 CONSULTATION DATE:  03/27/2018  REFERRING MD:  Dr. Leonel Ramsay  CHIEF COMPLAINT:  ICH  BRIEF SUMMARY:  77 year old female with PMH of HTN, CKD, CHF (EF 55% and grade 2 DD in 2016), and COPD. She presented to [facility] ED 8/22 with complaints of headache, nausea, and vomiting with onset of symptoms around 1200. She was found to be hypertensive. CT scan demonstrated hematoma in the left cerebellum with early hydrocephalus. Initially no neurosurgical intervention was indicated due to her reasonable mental status and dual antiplatelet regimen, however, her condition continued to decline and she required intubation. She was then deemed a candidate for EVD placement. PCCM consulted for medical/vent/BP management. Ongoing course complicated by decreased mentation. She has been able to follow commands, and was able to wake up some. Extubation was attempted on 8/28 and 8/29, but she failed due to stridor and has subsequently not had a cuff leak with deflated cuff. Failed extubation again on 9/5 after meeting criteria. Tracheostomy done 9/6.   INTERVAL: Failed extubation yesterday, trached this morning.   VITAL SIGNS: BP (!) 145/59   Pulse (!) 52   Temp 97.9 F (36.6 C) (Axillary)   Resp (!) 24   Ht 5\' 6"  (1.676 m)   Wt 67.4 kg   SpO2 100%   BMI 23.98 kg/m   HEMODYNAMICS:    VENTILATOR SETTINGS: Vent Mode: PRVC FiO2 (%):  [40 %-50 %] 50 % Set Rate:  [15 bmp] 15 bmp Vt Set:  [470 mL] 470 mL PEEP:  [5 cmH20] 5 cmH20 Plateau Pressure:  [17 cmH20-18 cmH20] 18 cmH20  INTAKE / OUTPUT: I/O last 3 completed shifts: In: 3927.5 [I.V.:877.5; NG/GT:3050] Out: 3125 [Urine:3125]  PHYSICAL EXAMINATION: General: elderly female in NAD on vent HEENT: [place]/AT, PERRL, no JVD.   Neuro: Eyes open spontaneously. Follows commands. But overall depressed mental status.  CV: RRR, no MRG    PULM:  Clear, unlabored. Lurline Idol now in place.  GI: Soft, non tender, nondistended Extremities: No acute deformity. No edema.  Skin:Grossly intact.   LABS:  BMET Recent Labs  Lab 04/08/18 0432 04/09/18 0500 04/11/18 0613  NA 136 137 139  K 4.2 4.3 4.3  CL 103 105 105  CO2 27 26 23   BUN 33* 30* 30*  CREATININE 0.95 0.92 0.86  GLUCOSE 125* 115* 120*   Electrolytes Recent Labs  Lab 04/08/18 0432 04/09/18 0500 04/11/18 0613  CALCIUM 8.3* 8.1* 8.4*  MG  --  2.1 2.2  PHOS  --  3.4 3.9   CBC Recent Labs  Lab 04/08/18 0432 04/09/18 0500 04/11/18 0613  WBC 16.7* 17.5* 15.5*  HGB 8.3* 7.6* 7.8*  HCT 26.9* 25.6* 26.0*  PLT 413* 432* 490*   Coag's Recent Labs  Lab 04/11/18 0613  INR 1.06   Sepsis Markers No results for input(s): LATICACIDVEN, PROCALCITON, O2SATVEN in the last 168 hours.  ABG No results for input(s): PHART, PCO2ART, PO2ART in the last 168 hours.  Liver Enzymes No results for input(s): AST, ALT, ALKPHOS, BILITOT, ALBUMIN in the last 168 hours.  Cardiac Enzymes No results for input(s): TROPONINI, PROBNP in the last 168 hours.  Glucose Recent Labs  Lab 04/10/18 1605 04/10/18 1955 04/10/18 2319 04/11/18 0320 04/11/18 0755 04/11/18 1155  GLUCAP 103* 143* 124* 126* 115* 114*    Imaging Dg Chest Port 1 View  Result Date: 04/11/2018 CLINICAL DATA:  Tracheostomy. EXAM: PORTABLE CHEST 1 VIEW COMPARISON:  04/10/2018. FINDINGS: Tracheostomy tube noted with tip over the trachea. Right PICC line stable position. Cardiomegaly. Bibasilar atelectasis. Bibasilar infiltrates/edema. Small left pleural effusion. IMPRESSION: 1. Tracheostomy tube in od anatomic position. Right PICC line noted with tip in stable position. 2.  Stable cardiomegaly. 3. Bibasilar atelectasis. Bibasilar infiltrates/edema. Small left pleural effusion. No pneumothorax. Electronically Signed   By: Marcello Moores  Register   On: 04/11/2018 10:38   Dg Chest Port 1 View  Result Date:  04/10/2018 CLINICAL DATA:  Intubation. EXAM: PORTABLE CHEST 1 VIEW COMPARISON:  Chest radiograph April 09, 2018 FINDINGS: Endotracheal tube tip projects 7.9 cm above the carina, above the level of the clavicle. Stable RIGHT PICC distal tip projecting in distal superior vena cava. Interval removal of nasogastric tube. Low inspiratory examination with small pleural effusions and bibasilar strandy densities. Mild cardiomegaly. Calcified aortic arch. No pneumothorax. Soft tissue planes and included osseous structures are non suspicious. IMPRESSION: Endotracheal tube tip projects 7.9 cm above the carina, recommend 3 cm advancement. Stable RIGHT PICC. Interval removal of nasogastric tube. Stable cardiomegaly with small pleural effusions and bibasilar atelectasis/scarring. Aortic Atherosclerosis (ICD10-I70.0). Electronically Signed   By: Elon Alas M.D.   On: 04/10/2018 18:45     STUDIES:  CT head 8/22 > 2 cm hematoma in the deep left cerebellum with fourth ventricular extension extending into the third/lateral ventricles and into the dorsal cervical canal. There is mild hydrocephalus at the lateral ventricles when compared to 2016. A deep cerebellar origin favors hypertensive etiology. CT angio head 8/22 > No vascular malformation or abnormal enhancement of the brain identified. Stable cerebellar and intraventricular hemorrhage. Patent anterior and posterior intracranial circulation. No large vessel occlusion, aneurysm, or significant stenosis. Calcific atherosclerosis of the carotid and vertebral arteries with mild stenosis. CT Head w/o 8/27 >  Partial interval dispersion of intraventricular, subarachnoid and left cerebellar brain parenchymal hemorrhage.  Resolution of hydrocephalus.  Stable position of right frontal approach ventric catheter, no new acute process.  CT head 9/3 > evolving left cerebellar hematoma with intraventricular extension, stable mild hydrocephalus with right frontal  ventriculostomy, small volume redistributed SAH CT head 9/5 > Continued interval evolution of left cerebellar hematoma with slightly improved localized vasogenic edema, with persistent but slightly improved mass effect on the adjacent fourth ventricle. Intraventricular extension with similar small volume intraventricular blood products. Stable mild hydrocephalus with right frontal ventriculostomy in place.  CULTURES: BCx2 8/26 > NEG  Tracheal Aspirate 8/26 >> negative  CSF 8/29 >> negative Trach aspirate 9/3 > few candida albicans.   ANTIBIOTICS: Vanco 8/26 >> 9/2 Cefepime 8/26 >> 9/2  SIGNIFICANT EVENTS: 8/22 Admit, EVD placed.  8/26 Fever, empiric abx added  8/28 EVD replaced (clogged), ETT replaced (bit through).  Added provigil.  8/29 extubated , reintubated for stridor  9/5 failed exutbaion 9/6 trach  LINES/TUBES: ETT 8/22 > 8/28, 8/28 >8/29 , 8/29 > 9/6 EVD 8/22 >9/5 Trach 6 shiley cuffed 9/6 >>  ASSESSMENT / PLAN:  Intracranial hemorrhage - 2cm L cerebellar hematoma with intraventricular extension. Secondary to hypertensive crisis.  Complicated by Obstructive Hydrocephalus s/p EVD (placed 8/22) Intermittent Agitation  P: SBP goal < 125mmHg Holding home antiplatelet agents Melatonin PM, Provigil AM.  Stoke service primary.  PT this afternoon.  Intubated for airway protection in the setting of intracranial hemorrhage - has failed extubation x 1 due to stridor. Hx Tobacco Abuse  P: Trach today ATC once awake as she will tolerate. PSV nocturnal ventilation if  needed.   COPD - without acute exacerbation P: Duoneb Q6  Brovana + Pulmicort BID   Osteoarthritis P: Cont prednisone 5mg  QD (chronic dose)   Hx Chronic CHF, HTN Bradycardia improved   P: Cont Labetolol, amlodipine PRN hydralazine for SBP goal < 322  Non AG Metabolic acidosis due to Hyperchloremia - resolved Hyperkalemia - resolved  P: Cont free water 2104mL q 8 hours. Trend BMP / urinary  output  Leukocytosis - no indication of infection Fever - new 8/26-temp tr down.  S/p course of abx.  CSF negative. P: Monitor  Anemia - no signs of bleeding P: Follow CBC Consider transfusion for Hgb < 7  Best Practice GI: PPI  DVT: Hep  Feeding: TF  > for CorTrak today Family: Daughter updated at bedside 9/6  Georgann Housekeeper, AGACNP-BC [facility] Pager [phone] or [phone]  04/11/2018 12:02 PM

## 2018-04-11 NOTE — Procedures (Signed)
Percutaneous Tracheostomy Placement  Consent from family.  Patient sedated, paralyzed and position.  Placed on 100% FiO2 and RR matched.  Area cleaned and draped.  Lidocaine/epi injected.  Skin incision done followed by blunt dissection.  Trachea palpated then punctured, catheter passed and visualized bronchoscopically.  Wire placed and visualized.  Catheter removed.  Airway then entered and dilated.  Size 6 cuffed shiley trach placed and visualized bronchoscopically well above carina.  Good volume returns.  Patient tolerated the procedure well without complications.  Minimal blood loss.  CXR ordered and pending.  Amber Rogers, M.D. [facility]. Pager: [phone]. After hours pager: [phone].  

## 2018-04-12 ENCOUNTER — Inpatient Hospital Stay (HOSPITAL_COMMUNITY): Payer: Medicare Other

## 2018-04-12 DIAGNOSIS — Z430 Encounter for attention to tracheostomy: Secondary | ICD-10-CM

## 2018-04-12 DIAGNOSIS — Z930 Tracheostomy status: Secondary | ICD-10-CM

## 2018-04-12 DIAGNOSIS — N179 Acute kidney failure, unspecified: Secondary | ICD-10-CM

## 2018-04-12 LAB — GLUCOSE, CAPILLARY
GLUCOSE-CAPILLARY: 108 mg/dL — AB (ref 70–99)
GLUCOSE-CAPILLARY: 119 mg/dL — AB (ref 70–99)
Glucose-Capillary: 105 mg/dL — ABNORMAL HIGH (ref 70–99)
Glucose-Capillary: 109 mg/dL — ABNORMAL HIGH (ref 70–99)
Glucose-Capillary: 128 mg/dL — ABNORMAL HIGH (ref 70–99)
Glucose-Capillary: 166 mg/dL — ABNORMAL HIGH (ref 70–99)

## 2018-04-12 LAB — BASIC METABOLIC PANEL
ANION GAP: 9 (ref 5–15)
BUN: 36 mg/dL — ABNORMAL HIGH (ref 8–23)
CHLORIDE: 104 mmol/L (ref 98–111)
CO2: 25 mmol/L (ref 22–32)
Calcium: 8.3 mg/dL — ABNORMAL LOW (ref 8.9–10.3)
Creatinine, Ser: 1.08 mg/dL — ABNORMAL HIGH (ref 0.44–1.00)
GFR calc non Af Amer: 49 mL/min — ABNORMAL LOW (ref >60)
GFR, EST AFRICAN AMERICAN: 57 mL/min — AB (ref >60)
Glucose, Bld: 125 mg/dL — ABNORMAL HIGH (ref 70–99)
Potassium: 4 mmol/L (ref 3.5–5.1)
SODIUM: 138 mmol/L (ref 135–145)

## 2018-04-12 LAB — CBC
HCT: 25.3 % — ABNORMAL LOW (ref 36.0–46.0)
HEMOGLOBIN: 7.6 g/dL — AB (ref 12.0–15.0)
MCH: 26.9 pg (ref 26.0–34.0)
MCHC: 30 g/dL (ref 30.0–36.0)
MCV: 89.4 fL (ref 78.0–100.0)
Platelets: 588 10*3/uL — ABNORMAL HIGH (ref 150–400)
RBC: 2.83 MIL/uL — AB (ref 3.87–5.11)
RDW: 15.9 % — ABNORMAL HIGH (ref 11.5–15.5)
WBC: 17.7 10*3/uL — AB (ref 4.0–10.5)

## 2018-04-12 NOTE — Progress Notes (Signed)
RT note: RT placed patient on 35% ATC. Patient states she is having no difficulty breathing and is sating 100%. RT will continue to monitor.

## 2018-04-12 NOTE — Progress Notes (Addendum)
PULMONARY / CRITICAL CARE MEDICINE   Name: Amber Rogers MRN: [id number] DOB: 06-28-1941    ADMISSION DATE:  03/27/2018 CONSULTATION DATE:  03/27/2018  REFERRING MD:  Dr. Leonel Ramsay  CHIEF COMPLAINT:  ICH  BRIEF SUMMARY:  77 year old female with PMH of HTN, CKD, CHF (EF 55% and grade 2 DD in 2016), and COPD. She presented to [facility] ED 8/22 with complaints of headache, nausea, and vomiting with onset of symptoms around 1200. She was found to be hypertensive. CT scan demonstrated hematoma in the left cerebellum with early hydrocephalus. Initially no neurosurgical intervention was indicated due to her reasonable mental status and dual antiplatelet regimen, however, her condition continued to decline and she required intubation. She was then deemed a candidate for EVD placement. PCCM consulted for medical/vent/BP management. Ongoing course complicated by decreased mentation. She has been able to follow commands, and was able to wake up some. Extubation was attempted on 8/28 and 8/29, but she failed due to stridor and has subsequently not had a cuff leak with deflated cuff. Failed extubation again on 9/5 after meeting criteria. Tracheostomy done 9/6.   INTERVAL: S/p trach 9/6 awake and f/c  VITAL SIGNS: Blood Pressure (Abnormal) 165/67   Pulse 75   Temperature 100.3 F (37.9 C) Comment: tylenol given  Respiration 17   Height 5\' 6"  (1.676 m)   Weight 65.6 kg   Oxygen Saturation 99%   Body Mass Index 23.34 kg/m   HEMODYNAMICS:    VENTILATOR SETTINGS: Vent Mode: CPAP;PSV FiO2 (%):  [40 %-50 %] 40 % Set Rate:  [15 bmp-24 bmp] 15 bmp Vt Set:  [470 mL] 470 mL PEEP:  [5 cmH20] 5 cmH20 Pressure Support:  [5 cmH20] 5 cmH20 Plateau Pressure:  [12 cmH20-18 cmH20] 12 cmH20  INTAKE / OUTPUT: I/O last 3 completed shifts: In: 1817.4 [I.V.:767.4; NG/GT:1050] Out: 1600 [Urine:1600]  PHYSICAL EXAMINATION: General 77 year old female. Follows commands. No distress on PSV HENT NCAT size 6  trach unremarkable NCAT pulm CTA good Vts on PSV Card RRR w/out MRG abd soft not tender + bowel sounds Neuro follows commands. Moves all ext Ext brisk CR warm no edema  LABS:  BMET Recent Labs  Lab 04/09/18 0500 04/11/18 0613 04/12/18 0500  NA 137 139 138  K 4.3 4.3 4.0  CL 105 105 104  CO2 26 23 25   BUN 30* 30* 36*  CREATININE 0.92 0.86 1.08*  GLUCOSE 115* 120* 125*   Electrolytes Recent Labs  Lab 04/09/18 0500 04/11/18 0613 04/12/18 0500  CALCIUM 8.1* 8.4* 8.3*  MG 2.1 2.2  --   PHOS 3.4 3.9  --    CBC Recent Labs  Lab 04/09/18 0500 04/11/18 0613 04/12/18 0500  WBC 17.5* 15.5* 17.7*  HGB 7.6* 7.8* 7.6*  HCT 25.6* 26.0* 25.3*  PLT 432* 490* 588*   Coag's Recent Labs  Lab 04/11/18 0613  INR 1.06   Sepsis Markers No results for input(s): LATICACIDVEN, PROCALCITON, O2SATVEN in the last 168 hours.  ABG No results for input(s): PHART, PCO2ART, PO2ART in the last 168 hours.  Liver Enzymes No results for input(s): AST, ALT, ALKPHOS, BILITOT, ALBUMIN in the last 168 hours.  Cardiac Enzymes No results for input(s): TROPONINI, PROBNP in the last 168 hours.  Glucose Recent Labs  Lab 04/11/18 1155 04/11/18 1511 04/11/18 1950 04/11/18 2335 04/12/18 0343 04/12/18 0837  GLUCAP 114* 90 90 107* 108* 109*    Imaging Dg Chest Port 1 View  Result Date: 04/11/2018 CLINICAL DATA:  Tracheostomy. EXAM: PORTABLE CHEST 1 VIEW COMPARISON:  04/10/2018. FINDINGS: Tracheostomy tube noted with tip over the trachea. Right PICC line stable position. Cardiomegaly. Bibasilar atelectasis. Bibasilar infiltrates/edema. Small left pleural effusion. IMPRESSION: 1. Tracheostomy tube in od anatomic position. Right PICC line noted with tip in stable position. 2.  Stable cardiomegaly. 3. Bibasilar atelectasis. Bibasilar infiltrates/edema. Small left pleural effusion. No pneumothorax. Electronically Signed   By: Marcello Moores  Register   On: 04/11/2018 10:38     STUDIES:  CT head 8/22 >  2 cm hematoma in the deep left cerebellum with fourth ventricular extension extending into the third/lateral ventricles and into the dorsal cervical canal. There is mild hydrocephalus at the lateral ventricles when compared to 2016. A deep cerebellar origin favors hypertensive etiology. CT angio head 8/22 > No vascular malformation or abnormal enhancement of the brain identified. Stable cerebellar and intraventricular hemorrhage. Patent anterior and posterior intracranial circulation. No large vessel occlusion, aneurysm, or significant stenosis. Calcific atherosclerosis of the carotid and vertebral arteries with mild stenosis. CT Head w/o 8/27 >  Partial interval dispersion of intraventricular, subarachnoid and left cerebellar brain parenchymal hemorrhage.  Resolution of hydrocephalus.  Stable position of right frontal approach ventric catheter, no new acute process.  CT head 9/3 > evolving left cerebellar hematoma with intraventricular extension, stable mild hydrocephalus with right frontal ventriculostomy, small volume redistributed SAH CT head 9/5 > Continued interval evolution of left cerebellar hematoma with slightly improved localized vasogenic edema, with persistent but slightly improved mass effect on the adjacent fourth ventricle. Intraventricular extension with similar small volume intraventricular blood products. Stable mild hydrocephalus with right frontal ventriculostomy in place.  CULTURES: BCx2 8/26 > NEG  Tracheal Aspirate 8/26 >> negative  CSF 8/29 >> negative Trach aspirate 9/3 > few candida albicans.   ANTIBIOTICS: Vanco 8/26 >> 9/2 Cefepime 8/26 >> 9/2  SIGNIFICANT EVENTS: 8/22 Admit, EVD placed.  8/26 Fever, empiric abx added  8/28 EVD replaced (clogged), ETT replaced (bit through).  Added provigil.  8/29 extubated , reintubated for stridor  9/5 failed exutbaion 9/6 trach  LINES/TUBES: ETT 8/22 > 8/28, 8/28 >8/29 , 8/29 > 9/6 EVD 8/22 >9/5 Trach 6 shiley cuffed 9/6  >>  ASSESSMENT / PLAN: Resolved problems Fever Non-anion gap metabolic acidosis in setting of hyperchloremia  Active issues Intracranial hemorrhage - 2cm L cerebellar hematoma with intraventricular extension. Secondary to hypertensive crisis.  Complicated by Obstructive Hydrocephalus s/p EVD (placed 8/22) Intermittent Agitation  Plan Serial neuro checks Holding home antiplatelet agents Systolic blood pressure goal less than 180 Melatonin in the evening Provigil in a.m. Physical therapy services Additional recommendations per stroke service  Intubated for airway protection in the setting of intracranial hemorrhage - has failed extubation x 1 due to stridor. Tracheostomy status 9/6 Hx Tobacco Abuse  Plan Aerosol trach collar as tolerated, rest on pressure support (if needed) Working towards liberation from ventilator  COPD - without acute exacerbation Plan Continuing DuoNeb, Brovana, and budesonide  Osteoarthritis Plan No change in chronic prednisone 5 mg daily  Hx Chronic CHF, HTN Bradycardia improved   Plan Continue labetalol and amlodipine PRN hydralazine for blood pressure less than 180  Leukocytosis w/ low grade fever Plan Trend CBC and fever curve  Anemia - no signs of bleeding Hemoglobin has remained stable Plan Trend CBC Transfuse for hemoglobin less than 7  Best Practice GI: PPI  DVT: Hep  Feeding: TF  > for CorTrak placed. Getting bolus feeds to promote mobility  Family: Daughter updated at bedside 9/6  Erick Colace ACNP-BC [place] Pager # [phone] OR # [phone] if no answer   04/12/2018 8:49 AM  Attending Note:  77 year old female with [place] who failed extubation multiple times and was trached on 9/6.  On exam, patient is tolerating PS with coarse BS.  I reviewed CXR myself, trach is in a good position.  Discussed with PCCM-NP.  Daughter updated bedside.  Will attempt TC today.  Continue BP control.  Replace electrolytes.   BMET in AM.  Will need to determine if PEG is needed.  PCCM will continue to follow.  The patient is critically ill with multiple organ systems failure and requires high complexity decision making for assessment and support, frequent evaluation and titration of therapies, application of advanced monitoring technologies and extensive interpretation of multiple databases.   Critical Care Time devoted to patient care services described in this note is  32  Minutes. This time reflects time of care of this signee Dr Jennet Maduro. This critical care time does not reflect procedure time, or teaching time or supervisory time of PA/NP/Med student/Med Resident etc but could involve care discussion time.  Rush Farmer, M.D. [facility]/Critical Care Medicine. Pager: [phone]. After hours pager: [phone].

## 2018-04-12 NOTE — Progress Notes (Signed)
STROKE TEAM PROGRESS NOTE   INTERVAL HISTORY Patient's RN is at bedside. Pt had trach yesterday and had EVD removal 2 days ago. Pt still response to voice and mouth words like "OK" "what", but did not open eyes on command. Not able to keep eyes open after forced eye opening. Able to following some simple commands. On bolus tube feeding.    Vitals:   04/12/18 0400 04/12/18 0500 04/12/18 0600 04/12/18 0700  BP: (!) 164/61 (!) 145/55 (!) 166/107 (!) 171/66  Pulse: 64 66 66 68  Resp: 17 18 17 18   Temp:      TempSrc:      SpO2: 100% 100% 100% 100%  Weight:      Height:        CBC:  Recent Labs  Lab 04/11/18 0613 04/12/18 0500  WBC 15.5* 17.7*  HGB 7.8* 7.6*  HCT 26.0* 25.3*  MCV 88.4 89.4  PLT 490* 588*    Basic Metabolic Panel:  Recent Labs  Lab 04/09/18 0500 04/11/18 0613 04/12/18 0500  NA 137 139 138  K 4.3 4.3 4.0  CL 105 105 104  CO2 26 23 25   GLUCOSE 115* 120* 125*  BUN 30* 30* 36*  CREATININE 0.92 0.86 1.08*  CALCIUM 8.1* 8.4* 8.3*  MG 2.1 2.2  --   PHOS 3.4 3.9  --    Lipid Panel:     Component Value Date/Time   CHOL 177 03/30/2018 0235   TRIG 67 03/30/2018 2248   HDL 101 03/30/2018 0235   CHOLHDL 1.8 03/30/2018 0235   VLDL 10 03/30/2018 0235   LDLCALC 66 03/30/2018 0235   HgbA1c:  Lab Results  Component Value Date   HGBA1C 5.5 03/30/2018   Urine Drug Screen:     Component Value Date/Time   LABOPIA NONE DETECTED 03/29/2018 1110   COCAINSCRNUR NONE DETECTED 03/29/2018 1110   LABBENZ NONE DETECTED 03/29/2018 1110   AMPHETMU NONE DETECTED 03/29/2018 1110   THCU NONE DETECTED 03/29/2018 1110   LABBARB NONE DETECTED 03/29/2018 1110    Alcohol Level     Component Value Date/Time   ETH <10 03/27/2018 1354    IMAGING  Ct Angio Head W Or Wo Contrast 03/27/2018 IMPRESSION:  1. No vascular malformation or abnormal enhancement of the brain identified.  2. Stable cerebellar and intraventricular hemorrhage.  3. Patent anterior and posterior  intracranial circulation. No large vessel occlusion, aneurysm, or significant stenosis.  4. Calcific atherosclerosis of the carotid and vertebral arteries with mild stenosis.    Ct Head Wo Contrast 03/28/2018 IMPRESSION:  1. Stable volume of intracranial hemorrhage within the left cerebellar hemisphere and the ventricular system. Some redistribution of blood products to the lateral ventricle atria and sylvian fissures.  2. No new acute intracranial abnormality.  3. Interval right frontal approach ventriculostomy catheter placement. Mild decrease in hydrocephalus.    Ct Head Wo Contrast 03/27/2018 IMPRESSION:  2 cm hematoma in the deep left cerebellum with fourth ventricular extension extending into the third/lateral ventricles and into the dorsal cervical canal. There is mild hydrocephalus at the lateral ventricles when compared to 2016. A deep cerebellar origin favors hypertensive etiology.   CUS 1-39% ICA stenosis.  Vertebral artery flow is antegrade. Significant calcific plaque bilateral bifurcations, however, velocities are not significantly increased.   TTE - Left ventricle: The cavity size was normal. Wall thickness was   increased in a pattern of mild LVH. Systolic function was normal.   The estimated ejection fraction was in the range of  60% to 65%.   Doppler parameters are consistent with both elevated ventricular   end-diastolic filling pressure and elevated left atrial filling   pressure. - Aortic valve: There was mild stenosis. Valve area (VTI): 1.88   cm^2. Valve area (Vmax): 1.82 cm^2. Valve area (Vmean): 1.59   cm^2. - Mitral valve: Calcified annulus. - Atrial septum: No defect or patent foramen ovale was identified.  Ct Head Wo Contrast  Result Date: 03/30/2018 CLINICAL DATA:  77 y/o F; follow-up for intracranial hemorrhage, intraventricular hemorrhage, and hydrocephalus. EXAM: CT HEAD WITHOUT CONTRAST TECHNIQUE: Contiguous axial images were obtained from the base of  the skull through the vertex without intravenous contrast. COMPARISON:  03/28/2018 and 03/27/2018 CT head. FINDINGS: Brain: Stable volume acute hemorrhage within the left medial cerebellar hemisphere, ventricular system, and subarachnoid spaces of the sylvian fissures. There is some interval redistribution, for example decreased hemorrhage within the frontal horns of lateral ventricles, and increased pooling in the occipital horns. No new acute intracranial hemorrhage, stroke, or mass effect. Right frontal approach ventriculostomy catheter is stable in position traversing the frontal horn of right lateral ventricle. Stable ventricle size. Vascular: Calcific atherosclerosis of carotid siphons and vertebral arteries. Skull: Postsurgical changes related to a right frontal approach ventriculostomy catheter with decreased edema in the scalp. Sinuses/Orbits: Intubated patient. Maxillary sinus mucous retention cysts. Normal aeration of mastoid air cells. Orbits are unremarkable. Other: None. IMPRESSION: Stable volume of intraventricular, subarachnoid, and left cerebellar hemorrhage. Stable ventricle size. No new acute intracranial abnormality. Electronically Signed   By: Kristine Garbe M.D.   On: 03/30/2018 05:12    Ct Head Wo Contrast  Result Date: 04/01/2018 CLINICAL DATA:  77 y/o  F; follow-up of intracranial hemorrhage. EXAM: CT HEAD WITHOUT CONTRAST TECHNIQUE: Contiguous axial images were obtained from the base of the skull through the vertex without intravenous contrast. COMPARISON:  03/30/2018 CT head.  09/24/2014 CT head. FINDINGS: Brain: Partial interval dispersion of intraventricular, subarachnoid, and left cerebellar brain parenchymal hemorrhage. No new acute intracranial hemorrhage, stroke, or focal mass effect. Decreased size of the lateral and third ventricles, ventricle size now similar to 2016 CT head. Stable position of right frontal approach ventriculostomy catheter with tip in the right  lateral ventricle near the foramen of Monro. Vascular: Calcific atherosclerosis of carotid siphons and vertebral arteries. No hyperdense vessel identified. Skull: Right frontal burr hole postsurgical changes. Torus palatini and maxillaris. Sinuses/Orbits: Endotracheal and enteric tubes. Left maxillary sinus small mucous retention cyst. Visualized paranasal sinuses and mastoid air cells are otherwise normally aerated. Orbits are unremarkable. Other: None. IMPRESSION: 1. Partial interval dispersion of intraventricular, subarachnoid, and left cerebellar brain parenchymal hemorrhage. 2. Resolution of hydrocephalus. Stable position of right frontal approach ventriculostomy catheter. 3. No new acute intracranial process. Electronically Signed   By: Kristine Garbe M.D.   On: 04/01/2018 05:26   Dg Chest Port 1 View  Result Date: 04/01/2018 CLINICAL DATA:  Ventilator support EXAM: PORTABLE CHEST 1 VIEW COMPARISON:  03/31/2018 FINDINGS: Endotracheal tube tip is 2.5 cm above the carina. Nasogastric tube enters the stomach. Atelectasis in both lower lobes appears similar. Upper lungs remain clear. IMPRESSION: Endotracheal tube and nasogastric tube unchanged in well-positioned. Persistent atelectasis in both lower lobes. Electronically Signed   By: Nelson Chimes M.D.   On: 04/01/2018 09:08   Ct Head Wo Contrast  Result Date: 04/02/2018 CLINICAL DATA:  Postop unresponsiveness. EXAM: CT HEAD WITHOUT CONTRAST TECHNIQUE: Contiguous axial images were obtained from the base of the skull through the vertex without  intravenous contrast. COMPARISON:  Earlier today FINDINGS: Brain: Recurrent lateral and third ventricular hydrocephalus with ballooning of the horns. Clot is newly seen along the EVD catheter, which is presumably dysfunctional. There is also increased edema around the catheter which is likely tracking CSF. No rebleeding is seen. There is stable left cerebellar, intraventricular, and subarachnoid clot.  Negative for infarct. Vascular: Negative Skull: Unremarkable burr hole for EVD.  Right parietal osteoma. Sinuses/Orbits: Negative Other: During reading, case discussed with RN Roselyn Reef and findings were expected. She is currently calling Dr Kathyrn Sheriff for orders. IMPRESSION: 1. Recurrent lateral and third ventricular hydrocephalus. Newly seen clot along the EVD, presumed cause of catheter dysfunction. 2. No interval bleeding. 3. Increased low-density along the EVD, likely tracking CSF. 4. No herniation or infarct. Electronically Signed   By: Monte Fantasia M.D.   On: 04/02/2018 13:14   Ct Head Wo Contrast  Result Date: 04/10/2018 CLINICAL DATA:  Follow-up examination for a communicating hydrocephalus. EXAM: CT HEAD WITHOUT CONTRAST TECHNIQUE: Contiguous axial images were obtained from the base of the skull through the vertex without intravenous contrast. COMPARISON:  Prior CT from 04/08/2018 FINDINGS: Brain: Ventriculomegaly overall not significantly changed relative to previous exam. Degenerating blood products seen layering within the occipital horns of the lateral ventricles. Right frontal approach ventriculostomy remains in place with tip near the foramen of Monro. Small amount of blood products along the ventricular tract with surrounding edema. Left cerebellar hematoma continues to evolve, slightly less prominent as compared to previous. Persistent surrounding vasogenic edema with mass effect and partial effacement on the adjacent fourth ventricle, slightly improved. Stable atrophy with chronic small vessel ischemic disease. No acute large vessel territory infarct. Chronic right cerebellar infarct noted. No extra-axial fluid collection. Vascular: No hyperdense vessel. Calcified atherosclerosis at the skull base. Skull: Right frontal burr hole with overlying skin staples. Scalp soft tissues and calvarium demonstrate no acute new abnormality. Sinuses/Orbits: Globes and orbital soft tissues demonstrate no acute  finding. Mild mucosal thickening within the maxillary sinuses. Bilateral mastoid effusions are similar to previous. Other: Multiple dental caries noted. IMPRESSION: 1. Continued interval evolution of left cerebellar hematoma with slightly improved localized vasogenic edema, with persistent but slightly improved mass effect on the adjacent fourth ventricle. 2. Intraventricular extension with similar small volume intraventricular blood products. Stable mild hydrocephalus with right frontal ventriculostomy in place. 3. No other new acute intracranial abnormality. Electronically Signed   By: Jeannine Boga M.D.   On: 04/10/2018 05:49   Dg Chest Port 1 View  Result Date: 04/11/2018 CLINICAL DATA:  Tracheostomy. EXAM: PORTABLE CHEST 1 VIEW COMPARISON:  04/10/2018. FINDINGS: Tracheostomy tube noted with tip over the trachea. Right PICC line stable position. Cardiomegaly. Bibasilar atelectasis. Bibasilar infiltrates/edema. Small left pleural effusion. IMPRESSION: 1. Tracheostomy tube in od anatomic position. Right PICC line noted with tip in stable position. 2.  Stable cardiomegaly. 3. Bibasilar atelectasis. Bibasilar infiltrates/edema. Small left pleural effusion. No pneumothorax. Electronically Signed   By: Marcello Moores  Register   On: 04/11/2018 10:38      PHYSICAL EXAM  Temp:  [97.6 F (36.4 C)-100.3 F (37.9 C)] 100.3 F (37.9 C) (09/07 0844) Pulse Rate:  [51-75] 75 (09/07 0844) Resp:  [16-24] 17 (09/07 0800) BP: (101-180)/(43-107) 165/67 (09/07 0844) SpO2:  [99 %-100 %] 99 % (09/07 0817) FiO2 (%):  [40 %-50 %] 40 % (09/07 0817) Weight:  [65.6 kg] 65.6 kg (09/07 0245)  General - Well nourished, well developed, on trach collar.  Ophthalmologic - fundi not visualized due to  noncooperation.  Cardiovascular - Regular rate and rhythm.  Neuro - on trach collar, eyes closed, not on sedation. Not open eyes on voice but able to mouth the word "OK" "what" on voice stimulation. She follows simple  commands in all extremities with multiple prompts. Pupils are small 2 mm pinpoint and reactive to light. Eyes right gazed preference. Blinking to visual threat on the right but not consistent on the left. Corneal reflexes are brinsk. Seems to have right facial droop. Motor system exam she is able to withdraw all 4 extremities with painful stimuli and follow commands with multiple prompts, able to against gravity BUE 3+/5 and BLE 3/5 on pain. Increased muscle tone BUEs. Deep tendon reflexes are 1+ symmetric, but babinski positive bilaterally. Sensation, coordination and gait not tested.   ASSESSMENT/PLAN Ms. ALLICIA CULLEY is a 77 y.o. female with history of COPD, CHF, HTN, HLD, CKD III, CAD s/p stent presenting with sudden onset nausea, vomiting and dizziness.   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  Mild hydrocephalus lateral ventricles.    CTA head no vascular malformation or abnormal enhancement.  Stable cerebellar and IVH.  No LVO.  Intracranial atherosclerosis.  Repeat CT x 3 - Stable volume of intracranial hemorrhage and hydrocephalus  CUS - Significant calcific plaque bilateral bifurcations, however, velocities are not significantly increased.  2D Echo EF 60-65%  UDS neg  LDL 66  HgbA1c 5.5  Heparin subq for VTE prophylaxis  NPO on bolus feeds  aspirin 81 mg daily and Brilinta 90 mg twice daily prior to admission, now on no antithrombotics  Therapy recommendations:  Pending  Disposition:  pending   Obstructive hydrocephalus s/p R frontal EVD   EVD placed 8/22, replaced 8/28  CT showed recurrent hydro prior to EVD replacement  Repeat CT stable after EVD  replacement  Repeat CT stable 9/5 after 48h of EVD clamping  EVD removed on 04/10/18  NSG signed off  CT repeat today   Carotid stenosis   CUS 08/2017 R ICA stenosis 50-69%, L ICA < 50%  CUS repeat showed b/l ICA 1-39% stenosis but significant calcified plaque at carotid bifurcation bilaterally.   Acute Hypoxic Respiratory failure - extubation and re-intubation  Intubated in the ED with neuro decline   CCM on board  Extubated and re-intubated 04/03/18  Trach done 04/11/18  Currently on trach collar, tolerating well  Hypertensive Emergency  BP stable off cardene  Home Meds: Norvasc 10, resumed  Currently on norvasc 10 and labetalol to 100mg  tid  SBP goal < 160  AKI on CKD  Cre 1.36-1.43-1.57-0.86-1.08  Increase IVF to 50cc  D/c free water as Na 138  Continue monitoring  Hyperlipidemia  Home meds:  ?? Lipitor 80   LDL 66  Consider to resume statin on discharge  Other Stroke Risk Factors  Advanced age  Former Cigarette smoker, quit 2 years ago  Coronary artery disease s/p stent - on ASA and brilinta PTA  Chronic Congestive heart failure  Other Active Problems  Hx vertigo   COPD - on home prednisone per tube  Leukocytosis - 15.3->17.5->15.5->17.7  Hospital day # 16  This patient is critically ill due to cerebellar ICH with IVH and hydrocephalus, respiratory failure, hypertensive emergency  and at significant risk of neurological worsening, death form recurrent bleeding, hydrocephalus, increased ICP, cerebral edema and brain death, seizure. This patient's care requires constant monitoring of vital signs, hemodynamics, respiratory and cardiac monitoring, review of multiple databases, neurological assessment, discussion with family, other specialists and medical decision making of high complexity. I spent 40 minutes of neurocritical care time in the care of this patient. I had long discussion with daughter at bedside, updated pt current condition,  treatment plan and potential prognosis. She expressed understanding and appreciation.   Rosalin Hawking, MD PhD Stroke Neurology 04/12/2018 9:27 AM     To contact Stroke Continuity provider, please refer to [email or web address] After hours, contact General Neurology

## 2018-04-12 NOTE — Evaluation (Signed)
[facility]y-[facility] Speaking Valve - Evaluation Patient Details  Name: Amber Rogers MRN: [id number] Date of Birth: 1941-02-09  Today's Date: 04/12/2018 Time: 1440-1520 SLP Time Calculation (min) (ACUTE ONLY): 40 min  Past Medical History:  Past Medical History:  Diagnosis Date  . Asthma   . CHF (congestive heart failure) ([place])   . Chronic kidney disease (CKD), active medical management without dialysis, stage 3 (moderate) ([place])   . COPD (chronic obstructive pulmonary disease) ([place])   . Coronary artery disease   . High cholesterol   . Hypertension   . Myocardial infarction ([place]) 04/2015  . Osteoarthritis    "all over" (09/27/2016)  . Pneumonia 1996   Past Surgical History:  Past Surgical History:  Procedure Laterality Date  . CARDIAC CATHETERIZATION N/A 04/14/2015   Procedure: Left Heart Cath and Coronary Angiography;  Surgeon: Jolaine Artist, MD;  Location: [facility];  Service: Cardiovascular;  Laterality: N/A;  . CARDIAC CATHETERIZATION N/A 04/14/2015   Procedure: Coronary Stent Intervention;  Surgeon: Lorretta Harp, MD;  Location: [facility];  Service: Cardiovascular;  Laterality: N/A;  . CORONARY ANGIOPLASTY    . JOINT REPLACEMENT    . KNEE ARTHROSCOPY Right 2004  . TOTAL HIP ARTHROPLASTY Right 02/10/2013   Procedure: TOTAL HIP ARTHROPLASTY ANTERIOR APPROACH;  Surgeon: [name], MD;  Location: [facility];  Service: Orthopedics;  Laterality: Right;   HPI:  77 year old female with PMH of HTN, CKD, CHF, and COPD. She [facility] ED 8/22 with complaints of headache, nausea, and vomiting. 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 ETT 8/22-29;  reintubated same day due to stridor; ETT 8/29-9/5; reintubated again due to stridor; tracheostomy 9/6.    Assessment / Plan / Recommendation Clinical Impression  Pt with intermittent success tolerating PMV during first trial today.  RT present, provided  tracheal suctioning during/after cuff deflation.  With PMV placed for brief intervals of 2-5 breath cycles, pt was able to achieve low volume, hoarse phonation with cues to fully inhale and exhale.  Pt demonstrated intermittent signs of air trapping upon removal of valve.  Sp02 remained in 90s during trial; RR/HR were stable.  Pt making requests of daughter and oriented to person.  At end of session, cuff was reinflated.  Educated daughter, Amber Rogers, re: function of valve; reviewed PMV videos; discussed SLP plans for communication -PMV and cognition, as well as rehabilitation of Amber Rogers' swallow.  SLP will follow for readiness for instrumental swallow study, likely early next week.    SLP Visit Diagnosis: Aphonia (R49.1)    SLP Assessment  Patient needs continued Speech Lanaguage Pathology Services    Follow Up Recommendations  Inpatient Rehab    Frequency and Duration min 3x week  2 weeks    PMSV Trial PMSV was placed for: 10 minutes Able to redirect subglottic air through upper airway: Yes Able to Attain Phonation: Yes Voice Quality: Low vocal intensity;Hoarse Able to Expectorate Secretions: No attempts Breath Support for Phonation: Moderately decreased Intelligibility: Intelligibility reduced Phrase: 75-100% accurate Sentence: 75-100% accurate Respirations During Trial: 18 SpO2 During Trial: 100 % Pulse During Trial: 70 Behavior: Alert   Tracheostomy Tube  Additional Tracheostomy Tube Assessment Trach Collar Period: (noon) Secretion Description: thick,tan Level of Secretion Expectoration: Tracheal    Vent Dependency  Vent Dependent: No Vent Mode: (S) Stand-by FiO2 (%): 35 %    Cuff Deflation Trial  GO Tolerated Cuff Deflation: Yes Length  of Time for Cuff Deflation Trial: 30 Behavior: Alert        Amber Rogers 04/12/2018, 3:31 PM

## 2018-04-13 ENCOUNTER — Inpatient Hospital Stay (HOSPITAL_COMMUNITY): Payer: Medicare Other

## 2018-04-13 DIAGNOSIS — E876 Hypokalemia: Secondary | ICD-10-CM

## 2018-04-13 LAB — GLUCOSE, CAPILLARY
GLUCOSE-CAPILLARY: 117 mg/dL — AB (ref 70–99)
GLUCOSE-CAPILLARY: 106 mg/dL — AB (ref 70–99)
GLUCOSE-CAPILLARY: 155 mg/dL — AB (ref 70–99)
GLUCOSE-CAPILLARY: 97 mg/dL (ref 70–99)
Glucose-Capillary: 136 mg/dL — ABNORMAL HIGH (ref 70–99)

## 2018-04-13 LAB — BASIC METABOLIC PANEL
ANION GAP: 10 (ref 5–15)
BUN: 32 mg/dL — AB (ref 8–23)
CHLORIDE: 107 mmol/L (ref 98–111)
CO2: 25 mmol/L (ref 22–32)
Calcium: 8.3 mg/dL — ABNORMAL LOW (ref 8.9–10.3)
Creatinine, Ser: 1.03 mg/dL — ABNORMAL HIGH (ref 0.44–1.00)
GFR calc Af Amer: >60 mL/min (ref >60)
GFR calc non Af Amer: 52 mL/min — ABNORMAL LOW (ref >60)
GLUCOSE: 117 mg/dL — AB (ref 70–99)
POTASSIUM: 3.9 mmol/L (ref 3.5–5.1)
Sodium: 142 mmol/L (ref 135–145)

## 2018-04-13 LAB — CBC
HCT: 27.3 % — ABNORMAL LOW (ref 36.0–46.0)
HEMOGLOBIN: 8.1 g/dL — AB (ref 12.0–15.0)
MCH: 26.8 pg (ref 26.0–34.0)
MCHC: 29.7 g/dL — ABNORMAL LOW (ref 30.0–36.0)
MCV: 90.4 fL (ref 78.0–100.0)
Platelets: 557 10*3/uL — ABNORMAL HIGH (ref 150–400)
RBC: 3.02 MIL/uL — ABNORMAL LOW (ref 3.87–5.11)
RDW: 15.9 % — ABNORMAL HIGH (ref 11.5–15.5)
WBC: 19.1 10*3/uL — ABNORMAL HIGH (ref 4.0–10.5)

## 2018-04-13 LAB — URINALYSIS, COMPLETE (UACMP) WITH MICROSCOPIC
Bilirubin Urine: NEGATIVE
Glucose, UA: NEGATIVE mg/dL
HGB URINE DIPSTICK: NEGATIVE
Ketones, ur: NEGATIVE mg/dL
Leukocytes, UA: NEGATIVE
Nitrite: NEGATIVE
PROTEIN: NEGATIVE mg/dL
Specific Gravity, Urine: 1.018 (ref 1.005–1.030)
pH: 5 (ref 5.0–8.0)

## 2018-04-13 NOTE — Progress Notes (Signed)
STROKE TEAM PROGRESS NOTE   INTERVAL HISTORY Patient's RN is at bedside. Pt is sitting in the bed which converted to a chair this am. Pt able to open eyes on voice but not able to maintain eye opening. Able to mouth "OK" and "go away". Did not quite following simple commands with me. Had spiking fever at 101.2, will do UA, CXR and Blood Cx.     Vitals:   04/13/18 0809 04/13/18 0812 04/13/18 0906 04/13/18 1100  BP:   (!) 176/64 (!) 142/51  Pulse:   66 63  Resp:    (!) 24  Temp:      TempSrc:      SpO2: 96% 95%  96%  Weight:      Height:        CBC:  Recent Labs  Lab 04/12/18 0500 04/13/18 0911  WBC 17.7* 19.1*  HGB 7.6* 8.1*  HCT 25.3* 27.3*  MCV 89.4 90.4  PLT 588* 557*    Basic Metabolic Panel:  Recent Labs  Lab 04/09/18 0500 04/11/18 0613 04/12/18 0500 04/13/18 0911  NA 137 139 138 142  K 4.3 4.3 4.0 3.9  CL 105 105 104 107  CO2 26 23 25 25   GLUCOSE 115* 120* 125* 117*  BUN 30* 30* 36* 32*  CREATININE 0.92 0.86 1.08* 1.03*  CALCIUM 8.1* 8.4* 8.3* 8.3*  MG 2.1 2.2  --   --   PHOS 3.4 3.9  --   --    Lipid Panel:     Component Value Date/Time   CHOL 177 03/30/2018 0235   TRIG 67 03/30/2018 2248   HDL 101 03/30/2018 0235   CHOLHDL 1.8 03/30/2018 0235   VLDL 10 03/30/2018 0235   LDLCALC 66 03/30/2018 0235   HgbA1c:  Lab Results  Component Value Date   HGBA1C 5.5 03/30/2018   Urine Drug Screen:     Component Value Date/Time   LABOPIA NONE DETECTED 03/29/2018 1110   COCAINSCRNUR NONE DETECTED 03/29/2018 1110   LABBENZ NONE DETECTED 03/29/2018 1110   AMPHETMU NONE DETECTED 03/29/2018 1110   THCU NONE DETECTED 03/29/2018 1110   LABBARB NONE DETECTED 03/29/2018 1110    Alcohol Level     Component Value Date/Time   ETH <10 03/27/2018 1354    IMAGING  Ct Angio Head W Or Wo Contrast 03/27/2018 IMPRESSION:  1. No vascular malformation or abnormal enhancement of the brain identified.  2. Stable cerebellar and intraventricular hemorrhage.  3.  Patent anterior and posterior intracranial circulation. No large vessel occlusion, aneurysm, or significant stenosis.  4. Calcific atherosclerosis of the carotid and vertebral arteries with mild stenosis.    Ct Head Wo Contrast 03/28/2018 IMPRESSION:  1. Stable volume of intracranial hemorrhage within the left cerebellar hemisphere and the ventricular system. Some redistribution of blood products to the lateral ventricle atria and sylvian fissures.  2. No new acute intracranial abnormality.  3. Interval right frontal approach ventriculostomy catheter placement. Mild decrease in hydrocephalus.    Ct Head Wo Contrast 03/27/2018 IMPRESSION:  2 cm hematoma in the deep left cerebellum with fourth ventricular extension extending into the third/lateral ventricles and into the dorsal cervical canal. There is mild hydrocephalus at the lateral ventricles when compared to 2016. A deep cerebellar origin favors hypertensive etiology.   CUS 1-39% ICA stenosis.  Vertebral artery flow is antegrade. Significant calcific plaque bilateral bifurcations, however, velocities are not significantly increased.   TTE - Left ventricle: The cavity size was normal. Wall thickness was   increased  in a pattern of mild LVH. Systolic function was normal.   The estimated ejection fraction was in the range of 60% to 65%.   Doppler parameters are consistent with both elevated ventricular   end-diastolic filling pressure and elevated left atrial filling   pressure. - Aortic valve: There was mild stenosis. Valve area (VTI): 1.88   cm^2. Valve area (Vmax): 1.82 cm^2. Valve area (Vmean): 1.59   cm^2. - Mitral valve: Calcified annulus. - Atrial septum: No defect or patent foramen ovale was identified.  Ct Head Wo Contrast  Result Date: 03/30/2018 CLINICAL DATA:  77 y/o F; follow-up for intracranial hemorrhage, intraventricular hemorrhage, and hydrocephalus. EXAM: CT HEAD WITHOUT CONTRAST TECHNIQUE: Contiguous axial images  were obtained from the base of the skull through the vertex without intravenous contrast. COMPARISON:  03/28/2018 and 03/27/2018 CT head. FINDINGS: Brain: Stable volume acute hemorrhage within the left medial cerebellar hemisphere, ventricular system, and subarachnoid spaces of the sylvian fissures. There is some interval redistribution, for example decreased hemorrhage within the frontal horns of lateral ventricles, and increased pooling in the occipital horns. No new acute intracranial hemorrhage, stroke, or mass effect. Right frontal approach ventriculostomy catheter is stable in position traversing the frontal horn of right lateral ventricle. Stable ventricle size. Vascular: Calcific atherosclerosis of carotid siphons and vertebral arteries. Skull: Postsurgical changes related to a right frontal approach ventriculostomy catheter with decreased edema in the scalp. Sinuses/Orbits: Intubated patient. Maxillary sinus mucous retention cysts. Normal aeration of mastoid air cells. Orbits are unremarkable. Other: None. IMPRESSION: Stable volume of intraventricular, subarachnoid, and left cerebellar hemorrhage. Stable ventricle size. No new acute intracranial abnormality. Electronically Signed   By: Kristine Garbe M.D.   On: 03/30/2018 05:12    Ct Head Wo Contrast  Result Date: 04/01/2018 CLINICAL DATA:  77 y/o  F; follow-up of intracranial hemorrhage. EXAM: CT HEAD WITHOUT CONTRAST TECHNIQUE: Contiguous axial images were obtained from the base of the skull through the vertex without intravenous contrast. COMPARISON:  03/30/2018 CT head.  09/24/2014 CT head. FINDINGS: Brain: Partial interval dispersion of intraventricular, subarachnoid, and left cerebellar brain parenchymal hemorrhage. No new acute intracranial hemorrhage, stroke, or focal mass effect. Decreased size of the lateral and third ventricles, ventricle size now similar to 2016 CT head. Stable position of right frontal approach ventriculostomy  catheter with tip in the right lateral ventricle near the foramen of Monro. Vascular: Calcific atherosclerosis of carotid siphons and vertebral arteries. No hyperdense vessel identified. Skull: Right frontal burr hole postsurgical changes. Torus palatini and maxillaris. Sinuses/Orbits: Endotracheal and enteric tubes. Left maxillary sinus small mucous retention cyst. Visualized paranasal sinuses and mastoid air cells are otherwise normally aerated. Orbits are unremarkable. Other: None. IMPRESSION: 1. Partial interval dispersion of intraventricular, subarachnoid, and left cerebellar brain parenchymal hemorrhage. 2. Resolution of hydrocephalus. Stable position of right frontal approach ventriculostomy catheter. 3. No new acute intracranial process. Electronically Signed   By: Kristine Garbe M.D.   On: 04/01/2018 05:26   Dg Chest Port 1 View  Result Date: 04/01/2018 CLINICAL DATA:  Ventilator support EXAM: PORTABLE CHEST 1 VIEW COMPARISON:  03/31/2018 FINDINGS: Endotracheal tube tip is 2.5 cm above the carina. Nasogastric tube enters the stomach. Atelectasis in both lower lobes appears similar. Upper lungs remain clear. IMPRESSION: Endotracheal tube and nasogastric tube unchanged in well-positioned. Persistent atelectasis in both lower lobes. Electronically Signed   By: Nelson Chimes M.D.   On: 04/01/2018 09:08   Ct Head Wo Contrast  Result Date: 04/02/2018 CLINICAL DATA:  Postop unresponsiveness.  EXAM: CT HEAD WITHOUT CONTRAST TECHNIQUE: Contiguous axial images were obtained from the base of the skull through the vertex without intravenous contrast. COMPARISON:  Earlier today FINDINGS: Brain: Recurrent lateral and third ventricular hydrocephalus with ballooning of the horns. Clot is newly seen along the EVD catheter, which is presumably dysfunctional. There is also increased edema around the catheter which is likely tracking CSF. No rebleeding is seen. There is stable left cerebellar, intraventricular,  and subarachnoid clot. Negative for infarct. Vascular: Negative Skull: Unremarkable burr hole for EVD.  Right parietal osteoma. Sinuses/Orbits: Negative Other: During reading, case discussed with RN Roselyn Reef and findings were expected. She is currently calling Dr Kathyrn Sheriff for orders. IMPRESSION: 1. Recurrent lateral and third ventricular hydrocephalus. Newly seen clot along the EVD, presumed cause of catheter dysfunction. 2. No interval bleeding. 3. Increased low-density along the EVD, likely tracking CSF. 4. No herniation or infarct. Electronically Signed   By: Monte Fantasia M.D.   On: 04/02/2018 13:14   Ct Head Wo Contrast  Result Date: 04/10/2018 CLINICAL DATA:  Follow-up examination for a communicating hydrocephalus. EXAM: CT HEAD WITHOUT CONTRAST TECHNIQUE: Contiguous axial images were obtained from the base of the skull through the vertex without intravenous contrast. COMPARISON:  Prior CT from 04/08/2018 FINDINGS: Brain: Ventriculomegaly overall not significantly changed relative to previous exam. Degenerating blood products seen layering within the occipital horns of the lateral ventricles. Right frontal approach ventriculostomy remains in place with tip near the foramen of Monro. Small amount of blood products along the ventricular tract with surrounding edema. Left cerebellar hematoma continues to evolve, slightly less prominent as compared to previous. Persistent surrounding vasogenic edema with mass effect and partial effacement on the adjacent fourth ventricle, slightly improved. Stable atrophy with chronic small vessel ischemic disease. No acute large vessel territory infarct. Chronic right cerebellar infarct noted. No extra-axial fluid collection. Vascular: No hyperdense vessel. Calcified atherosclerosis at the skull base. Skull: Right frontal burr hole with overlying skin staples. Scalp soft tissues and calvarium demonstrate no acute new abnormality. Sinuses/Orbits: Globes and orbital soft tissues  demonstrate no acute finding. Mild mucosal thickening within the maxillary sinuses. Bilateral mastoid effusions are similar to previous. Other: Multiple dental caries noted. IMPRESSION: 1. Continued interval evolution of left cerebellar hematoma with slightly improved localized vasogenic edema, with persistent but slightly improved mass effect on the adjacent fourth ventricle. 2. Intraventricular extension with similar small volume intraventricular blood products. Stable mild hydrocephalus with right frontal ventriculostomy in place. 3. No other new acute intracranial abnormality. Electronically Signed   By: Jeannine Boga M.D.   On: 04/10/2018 05:49   Dg Chest Port 1 View  Result Date: 04/11/2018 CLINICAL DATA:  Tracheostomy. EXAM: PORTABLE CHEST 1 VIEW COMPARISON:  04/10/2018. FINDINGS: Tracheostomy tube noted with tip over the trachea. Right PICC line stable position. Cardiomegaly. Bibasilar atelectasis. Bibasilar infiltrates/edema. Small left pleural effusion. IMPRESSION: 1. Tracheostomy tube in od anatomic position. Right PICC line noted with tip in stable position. 2.  Stable cardiomegaly. 3. Bibasilar atelectasis. Bibasilar infiltrates/edema. Small left pleural effusion. No pneumothorax. Electronically Signed   By: Marcello Moores  Register   On: 04/11/2018 10:38   Ct Head Wo Contrast  Result Date: 04/12/2018 CLINICAL DATA:  Follow-up intracranial hemorrhage. Interval removal of EVD. EXAM: CT HEAD WITHOUT CONTRAST TECHNIQUE: Contiguous axial images were obtained from the base of the skull through the vertex without intravenous contrast. COMPARISON:  04/10/2018 FINDINGS: The study is mildly motion degraded. Brain: The ventriculostomy catheter has been removed in the interim with minimal  blood again noted along the prior catheter tract in the right frontal lobe. A small locule of gas is noted in the temporal horn of the right lateral ventricle. Small volume blood in the occipital horns of the lateral  ventricles has decreased on the right and has slightly increased on the left reflecting some interval redistribution without significant change in overall volume. Left cerebellar hematoma demonstrates stable to slightly decreased density and no significant change in size. Surrounding vasogenic edema is also unchanged with partial effacement of the fourth ventricle. Mild lateral and third ventriculomegaly is unchanged. There is no midline shift or extra-axial fluid collection. No acute large territory infarct or new intracranial hemorrhage is identified. Patchy cerebral white matter hypodensities are unchanged and nonspecific but compatible with moderate chronic small vessel ischemic disease. A small chronic infarct is again noted in the right cerebellum. Vascular: Calcified atherosclerosis at the skull base. No hyperdense vessel. Skull: Right frontal burr hole with adjacent skin staples remaining in place. Small right lateral skull osteoma. Sinuses/Orbits: Grossly unremarkable orbits within limitations of motion. No evidence of acute inflammatory paranasal sinus disease. Similar appearance of small bilateral mastoid effusions. Other: None. IMPRESSION: 1. Interval EVD removal with unchanged mild ventriculomegaly. 2. Unchanged left cerebellar hematoma and surrounding vasogenic edema. 3. Small volume intraventricular hemorrhage without significant change. 4. No evidence of new intracranial abnormality. Electronically Signed   By: Logan Bores M.D.   On: 04/12/2018 19:22     PHYSICAL EXAM  Temp:  [98.9 F (37.2 C)-101.2 F (38.4 C)] 101.2 F (38.4 C) (09/08 0800) Pulse Rate:  [57-72] 63 (09/08 1100) Resp:  [17-29] 24 (09/08 1100) BP: (129-176)/(46-111) 142/51 (09/08 1100) SpO2:  [82 %-100 %] 96 % (09/08 1100) FiO2 (%):  [35 %] 35 % (09/08 0812) Weight:  [63.5 kg] 63.5 kg (09/08 0455)  General - Well nourished, well developed, on trach collar.  Ophthalmologic - fundi not visualized due to  noncooperation.  Cardiovascular - Regular rate and rhythm.  Neuro - on trach collar, eyes closed, but able to open eyes on voice, able to mouth the word "OK" "go away". She did not follow simple commands this morning. Pupils are left 2 mm and right 32mm and reactive to light. Eyes right gazed preference. Blinking to visual threat on the right but not consistent on the left. Corneal reflexes are brinsk. Seems to have right facial droop. Motor system exam she is able to withdraw all 4 extremities with painful stimuli and follow commands with multiple prompts, able to against gravity BUE 3+/5 and BLE 3/5 on pain. Increased muscle tone BUEs. Deep tendon reflexes are 1+ symmetric, but babinski positive bilaterally. Sensation, coordination and gait not tested.   ASSESSMENT/PLAN Amber Rogers is a 77 y.o. female with history of COPD, CHF, HTN, HLD, CKD III, CAD s/p stent presenting with sudden onset nausea, vomiting and dizziness.   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  Mild hydrocephalus lateral ventricles.    CTA head no vascular malformation or abnormal enhancement.  Stable cerebellar and IVH.  No LVO.  Intracranial atherosclerosis.  Repeat CT x 3 - Stable volume of intracranial hemorrhage and hydrocephalus  CUS - Significant calcific plaque bilateral bifurcations, however, velocities are not significantly  increased.  2D Echo EF 60-65%  UDS neg  LDL 66  HgbA1c 5.5  Heparin subq for VTE prophylaxis  NPO on bolus feeds  aspirin 81 mg daily and Brilinta 90 mg twice daily prior to admission, now on no antithrombotics  Therapy recommendations:  Pending  Disposition:   pending   Obstructive hydrocephalus s/p R frontal EVD   EVD placed 8/22, replaced 8/28  CT showed recurrent hydro prior to EVD replacement  Repeat CT stable after EVD replacement  Repeat CT stable 9/5 after 48h of EVD clamping  EVD removed on 04/10/18  NSG signed off  CT repeat 04/12/18 stable mild hydrocephalus  Spiking fever  Temp 101.2  Check UA, CXR and blood culture  Discussed with CCM  Leukocytosis - 15.3->17.5->15.5->17.7->19.1  Carotid stenosis   CUS 08/2017 R ICA stenosis 50-69%, L ICA < 50%  CUS repeat showed b/l ICA 1-39% stenosis but significant calcified plaque at carotid bifurcation bilaterally.   Acute Hypoxic Respiratory failure - extubation and re-intubation  Intubated in the ED with neuro decline   CCM on board  Extubated and re-intubated 04/03/18  Trach done 04/11/18  Currently on trach collar, tolerating well  Hypertensive Emergency  BP stable off cardene  Home Meds: Norvasc 10, resumed  Currently on norvasc 10 and labetalol to 100mg  tid  SBP goal < 160  AKI on CKD  Cre 1.36-1.43-1.57-0.86-1.08->1.03  Increase IVF to 50cc  D/c free water as Na 138->142  Continue monitoring  Hyperlipidemia  Home meds:  ?? Lipitor 80   LDL 66  Consider to resume statin on discharge  Other Stroke Risk Factors  Advanced age  Former Cigarette smoker, quit 2 years ago  Coronary artery disease s/p stent - on ASA and brilinta PTA  Chronic Congestive heart failure  Other Active Problems  Hx vertigo   COPD - on home prednisone per tube  Hospital day # 17  This patient is critically ill due to cerebellar ICH with IVH and hydrocephalus, respiratory failure, hypertensive emergency and at significant risk of neurological worsening, death form recurrent bleeding, hydrocephalus, increased ICP, cerebral edema and brain death, seizure. This patient's care requires constant monitoring of vital signs, hemodynamics, respiratory and cardiac monitoring,  review of multiple databases, neurological assessment, discussion with family, other specialists and medical decision making of high complexity. I spent 40 minutes of neurocritical care time in the care of this patient.   Amber Hawking, Amber Rogers Stroke Neurology 04/13/2018 11:20 AM     To contact Stroke Continuity provider, please refer to [email or web address] After hours, contact General Neurology

## 2018-04-13 NOTE — Progress Notes (Signed)
Patient up in chair. Unable to perform chest PT at this time. Will resume when patient is returned to bed. RT will continue to monitor.

## 2018-04-13 NOTE — Plan of Care (Signed)
Pt transferred from bed to chair and tolerated sitting up for greater than 4 hours today. RN will continue to encourage mobilization

## 2018-04-13 NOTE — Progress Notes (Signed)
SLP Cancellation Note  Patient Details Name: Amber Rogers MRN: [id number] DOB: 1940-08-30   Cancelled treatment:       Reason Eval/Treat Not Completed: Fatigue/lethargy limiting ability to participate  [place], [place] 04/13/2018, 9:31 AM

## 2018-04-13 NOTE — Progress Notes (Addendum)
PULMONARY / CRITICAL CARE MEDICINE   Name: Amber Rogers MRN: [id number] DOB: [birth year]    ADMISSION DATE:  03/27/2018 CONSULTATION DATE:  03/27/2018  REFERRING MD:  Dr. Leonel Ramsay  CHIEF COMPLAINT:  ICH  BRIEF SUMMARY:  77 year old female with PMH of HTN, CKD, CHF (EF 55% and grade 2 DD in 2016), and COPD. She presented to [facility] ED 8/22 with complaints of headache, nausea, and vomiting with onset of symptoms around 1200. She was found to be hypertensive. CT scan demonstrated hematoma in the left cerebellum with early hydrocephalus. Initially no neurosurgical intervention was indicated due to her reasonable mental status and dual antiplatelet regimen, however, her condition continued to decline and she required intubation. She was then deemed a candidate for EVD placement. PCCM consulted for medical/vent/BP management. Ongoing course complicated by decreased mentation. She has been able to follow commands, and was able to wake up some. Extubation was attempted on 8/28 and 8/29, but she failed due to stridor and has subsequently not had a cuff leak with deflated cuff. Failed extubation again on 9/5 after meeting criteria. Tracheostomy done 9/6.   INTERVAL: Tolerated aerosol trach collar all night.  VITAL SIGNS: Blood Pressure (Abnormal) 157/63   Pulse 64   Temperature 99.3 F (37.4 C) (Axillary)   Respiration (Abnormal) 23   Height 5\' 6"  (1.676 m)   Weight 63.5 kg   Oxygen Saturation 95%   Body Mass Index 22.60 kg/m   HEMODYNAMICS:    VENTILATOR SETTINGS: Vent Mode: Stand-by FiO2 (%):  [35 %] 35 %  INTAKE / OUTPUT: I/O last 3 completed shifts: In: 2488.8 [I.V.:1440.8; NG/GT:1048] Out: 2100 [Urine:2100]  PHYSICAL EXAMINATION: General: 77 year old female patient is following CVA.  She is resting comfortably and in no acute distress on aerosol trach collar HEENT normocephalic atraumatic no jugular venous distention.  Tracheostomy unremarkable Pulmonary: Some scattered  rhonchi that improved with cough Cardiac: Regular rate and rhythm Abdomen: Soft nontender Extremities: No significant edema brisk cap refill warm dry strong pulses Neuro: Follows commands, interactive.  Seems to have a trend where she is more sleepy in the morning, and perks up during the later afternoon/late morning.  LABS:  BMET Recent Labs  Lab 04/09/18 0500 04/11/18 0613 04/12/18 0500  NA 137 139 138  K 4.3 4.3 4.0  CL 105 105 104  CO2 26 23 25   BUN 30* 30* 36*  CREATININE 0.92 0.86 1.08*  GLUCOSE 115* 120* 125*   Electrolytes Recent Labs  Lab 04/09/18 0500 04/11/18 0613 04/12/18 0500  CALCIUM 8.1* 8.4* 8.3*  MG 2.1 2.2  --   PHOS 3.4 3.9  --    CBC Recent Labs  Lab 04/09/18 0500 04/11/18 0613 04/12/18 0500  WBC 17.5* 15.5* 17.7*  HGB 7.6* 7.8* 7.6*  HCT 25.6* 26.0* 25.3*  PLT 432* 490* 588*   Coag's Recent Labs  Lab 04/11/18 0613  INR 1.06   Sepsis Markers No results for input(s): LATICACIDVEN, PROCALCITON, O2SATVEN in the last 168 hours.  ABG No results for input(s): PHART, PCO2ART, PO2ART in the last 168 hours.  Liver Enzymes No results for input(s): AST, ALT, ALKPHOS, BILITOT, ALBUMIN in the last 168 hours.  Cardiac Enzymes No results for input(s): TROPONINI, PROBNP in the last 168 hours.  Glucose Recent Labs  Lab 04/12/18 1204 04/12/18 1516 04/12/18 1919 04/12/18 2347 04/13/18 0335 04/13/18 0821  GLUCAP 119* 166* 105* 128* 117* 106*    Imaging Ct Head Wo Contrast  Result Date: 04/12/2018 CLINICAL DATA:  Follow-up intracranial hemorrhage. Interval removal of EVD. EXAM: CT HEAD WITHOUT CONTRAST TECHNIQUE: Contiguous axial images were obtained from the base of the skull through the vertex without intravenous contrast. COMPARISON:  04/10/2018 FINDINGS: The study is mildly motion degraded. Brain: The ventriculostomy catheter has been removed in the interim with minimal blood again noted along the prior catheter tract in the right frontal  lobe. A small locule of gas is noted in the temporal horn of the right lateral ventricle. Small volume blood in the occipital horns of the lateral ventricles has decreased on the right and has slightly increased on the left reflecting some interval redistribution without significant change in overall volume. Left cerebellar hematoma demonstrates stable to slightly decreased density and no significant change in size. Surrounding vasogenic edema is also unchanged with partial effacement of the fourth ventricle. Mild lateral and third ventriculomegaly is unchanged. There is no midline shift or extra-axial fluid collection. No acute large territory infarct or new intracranial hemorrhage is identified. Patchy cerebral white matter hypodensities are unchanged and nonspecific but compatible with moderate chronic small vessel ischemic disease. A small chronic infarct is again noted in the right cerebellum. Vascular: Calcified atherosclerosis at the skull base. No hyperdense vessel. Skull: Right frontal burr hole with adjacent skin staples remaining in place. Small right lateral skull osteoma. Sinuses/Orbits: Grossly unremarkable orbits within limitations of motion. No evidence of acute inflammatory paranasal sinus disease. Similar appearance of small bilateral mastoid effusions. Other: None. IMPRESSION: 1. Interval EVD removal with unchanged mild ventriculomegaly. 2. Unchanged left cerebellar hematoma and surrounding vasogenic edema. 3. Small volume intraventricular hemorrhage without significant change. 4. No evidence of new intracranial abnormality. Electronically Signed   By: Logan Bores M.D.   On: 04/12/2018 19:22     STUDIES:  CT head 8/22 > 2 cm hematoma in the deep left cerebellum with fourth ventricular extension extending into the third/lateral ventricles and into the dorsal cervical canal. There is mild hydrocephalus at the lateral ventricles when compared to 2016. A deep cerebellar origin favors  hypertensive etiology. CT angio head 8/22 > No vascular malformation or abnormal enhancement of the brain identified. Stable cerebellar and intraventricular hemorrhage. Patent anterior and posterior intracranial circulation. No large vessel occlusion, aneurysm, or significant stenosis. Calcific atherosclerosis of the carotid and vertebral arteries with mild stenosis. CT Head w/o 8/27 >  Partial interval dispersion of intraventricular, subarachnoid and left cerebellar brain parenchymal hemorrhage.  Resolution of hydrocephalus.  Stable position of right frontal approach ventric catheter, no new acute process.  CT head 9/3 > evolving left cerebellar hematoma with intraventricular extension, stable mild hydrocephalus with right frontal ventriculostomy, small volume redistributed SAH CT head 9/5 > Continued interval evolution of left cerebellar hematoma with slightly improved localized vasogenic edema, with persistent but slightly improved mass effect on the adjacent fourth ventricle. Intraventricular extension with similar small volume intraventricular blood products. Stable mild hydrocephalus with right frontal ventriculostomy in place.  CULTURES: BCx2 8/26 > NEG  Tracheal Aspirate 8/26 >> negative  CSF 8/29 >> negative Trach aspirate 9/3 > few candida albicans.  Trach culture 9/8>>> BCX2 9/8>>> UC 9/8>>>  ANTIBIOTICS: Vanco 8/26 >> 9/2 Cefepime 8/26 >> 9/2  SIGNIFICANT EVENTS: 8/22 Admit, EVD placed.  8/26 Fever, empiric abx added  8/28 EVD replaced (clogged), ETT replaced (bit through).  Added provigil.  8/29 extubated , reintubated for stridor  9/5 failed exutbaion 9/6 trach  LINES/TUBES: ETT 8/22 > 8/28, 8/28 >8/29 , 8/29 > 9/6 EVD 8/22 >9/5 Trach 6 shiley cuffed  9/6 >>  ASSESSMENT / PLAN: Resolved problems Fever Non-anion gap metabolic acidosis in setting of hyperchloremia  Active issues Intracranial hemorrhage - 2cm L cerebellar hematoma with intraventricular extension.  Secondary to hypertensive crisis.  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 Tracheostomy status 9/6 Hx Tobacco Abuse  Plan DC ventilator from room  Routine trach care  Mobilize  Wean oxygen   COPD - without acute exacerbation Plan DuoNeb, Brovana, and budesonide.  Osteoarthritis Plan Holding chronic prednisone at 5 mg daily  Hx Chronic CHF, HTN Bradycardia improved   Plan Continue labetalol and amlodipine.  She has PRN hydralazine for blood pressure greater than 180   Leukocytosis w/ low grade fever Plan Ck UC Get sputum culture PCXR ordered.  Neuro ordered [facility]  Anemia - no signs of bleeding Hemoglobin has remained stable Plan Trend CBC Transfuse if hemoglobin less than 7  Best Practice GI: PPI  DVT: Hep  Feeding: TF  > for CorTrak placed. Getting bolus feeds to promote mobility  Family: Daughter updated at bedside 9/6  Clinical summary for today Looks good from pulmonary standpoint.  Tolerating aerosol trach collar.  I think we can discontinue the ventilator from the room.  From a tracheostomy standpoint we should begin Passy-Muir valve trials.  Also continue focus on rehabilitation efforts.  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  Patient was able to tolerate TC this AM very well.  On exam,  lungs are clear.  I reviewed CXR myself, trach is in a good position.  Discussed with PCCM-NP.  Respiratory failure:  - Monitor closely for airway protection and aspiration  - Remove vent from room  Hypoxemia:  - Titrate O2 for sat of 88-92%  Trach status:  - Maintain current trach size and type  Dysphagia:  - SLP to evaluate in AM, was too fatigued today to evaluate  - PMV per SLP  Hypokalemia:  - Replace  - BMET in AM  PCCM will continue to follow for trach management.  Patient seen and examined, agree with above note.  I dictated the care and orders written for this patient under my direction.  Rush Farmer, [place]

## 2018-04-14 LAB — GLUCOSE, CAPILLARY
GLUCOSE-CAPILLARY: 112 mg/dL — AB (ref 70–99)
GLUCOSE-CAPILLARY: 140 mg/dL — AB (ref 70–99)
Glucose-Capillary: 106 mg/dL — ABNORMAL HIGH (ref 70–99)
Glucose-Capillary: 116 mg/dL — ABNORMAL HIGH (ref 70–99)
Glucose-Capillary: 124 mg/dL — ABNORMAL HIGH (ref 70–99)
Glucose-Capillary: 124 mg/dL — ABNORMAL HIGH (ref 70–99)
Glucose-Capillary: 95 mg/dL (ref 70–99)

## 2018-04-14 LAB — BASIC METABOLIC PANEL
Anion gap: 6 (ref 5–15)
BUN: 32 mg/dL — ABNORMAL HIGH (ref 8–23)
CALCIUM: 8.1 mg/dL — AB (ref 8.9–10.3)
CHLORIDE: 111 mmol/L (ref 98–111)
CO2: 25 mmol/L (ref 22–32)
CREATININE: 0.92 mg/dL (ref 0.44–1.00)
GFR calc Af Amer: >60 mL/min (ref >60)
GFR calc non Af Amer: 59 mL/min — ABNORMAL LOW (ref >60)
GLUCOSE: 113 mg/dL — AB (ref 70–99)
Potassium: 3.7 mmol/L (ref 3.5–5.1)
Sodium: 142 mmol/L (ref 135–145)

## 2018-04-14 LAB — CBC
HEMATOCRIT: 23.9 % — AB (ref 36.0–46.0)
HEMOGLOBIN: 7.1 g/dL — AB (ref 12.0–15.0)
MCH: 27.1 pg (ref 26.0–34.0)
MCHC: 29.7 g/dL — AB (ref 30.0–36.0)
MCV: 91.2 fL (ref 78.0–100.0)
Platelets: 533 10*3/uL — ABNORMAL HIGH (ref 150–400)
RBC: 2.62 MIL/uL — ABNORMAL LOW (ref 3.87–5.11)
RDW: 15.9 % — AB (ref 11.5–15.5)
WBC: 18.1 10*3/uL — ABNORMAL HIGH (ref 4.0–10.5)

## 2018-04-14 MED ORDER — HYDRALAZINE HCL 50 MG PO TABS
50.0000 mg | ORAL_TABLET | Freq: Three times a day (TID) | ORAL | Status: DC
  Administered 2018-04-14 – 2018-04-19 (×15): 50 mg via ORAL
  Filled 2018-04-14 (×16): qty 1

## 2018-04-14 NOTE — Evaluation (Signed)
Clinical/Bedside Swallow Evaluation Patient Details  Name: Amber Rogers MRN: [id number] Date of Birth: 12/03/1940  Today's Date: 04/14/2018 Time: SLP Start Time (ACUTE ONLY): 1351 SLP Stop Time (ACUTE ONLY): 1400 SLP Time Calculation (min) (ACUTE ONLY): 9 min  Past Medical History:  Past Medical History:  Diagnosis Date  . Asthma   . CHF (congestive heart failure) ([place])   . Chronic kidney disease (CKD), active medical management without dialysis, stage 3 (moderate) ([place])   . COPD (chronic obstructive pulmonary disease) ([place])   . Coronary artery disease   . High cholesterol   . Hypertension   . Myocardial infarction ([place]) 04/2015  . Osteoarthritis    "all over" (09/27/2016)  . Pneumonia 1996   Past Surgical History:  Past Surgical History:  Procedure Laterality Date  . CARDIAC CATHETERIZATION N/A 04/14/2015   Procedure: Left Heart Cath and Coronary Angiography;  Surgeon: Jolaine Artist, MD;  Location: [place] [facility];  Service: Cardiovascular;  Laterality: N/A;  . CARDIAC CATHETERIZATION N/A 04/14/2015   Procedure: Coronary Stent Intervention;  Surgeon: Lorretta Harp, MD;  Location: [place] [facility];  Service: Cardiovascular;  Laterality: N/A;  . CORONARY ANGIOPLASTY    . JOINT REPLACEMENT    . KNEE ARTHROSCOPY Right 2004  . TOTAL HIP ARTHROPLASTY Right 02/10/2013   Procedure: TOTAL HIP ARTHROPLASTY ANTERIOR APPROACH;  Surgeon: [name], MD;  Location: [facility];  Service: Orthopedics;  Laterality: Right;   HPI:  77 year old female with PMH of HTN, CKD, CHF, and COPD. She presented to [facility] ED 8/22 with complaints of headache, nausea, and vomiting. 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 ETT 8/22-29;  reintubated same day due to stridor; ETT 8/29-9/5; reintubated again due to stridor; tracheostomy 9/6.    Assessment / Plan / Recommendation Clinical Impression  Pt was not alert enough to attempt PO  trials despite multimodal cueing and stimulation provided by SLP. She initially showed labial opening with presentation of swab for oral care, moving her tongue and attempting to bite on the swab. She then pursed her lips and would not accept anything else orally. Between this and her reduced level of alertness, PO trials were not administered today. Will continue to follow for readiness to start PO trials, at which point instrumental testing will also likely be indicated. SLP Visit Diagnosis: Dysphagia, unspecified (R13.10)    Aspiration Risk  Moderate aspiration risk;Severe aspiration risk    Diet Recommendation NPO;Alternative means - temporary   Medication Administration: Via alternative means    Other  Recommendations Oral Care Recommendations: Oral care QID   Follow up Recommendations Inpatient Rehab      Frequency and Duration min 3x week  2 weeks       Prognosis Prognosis for Safe Diet Advancement: Good      Swallow Study   General HPI: 77 year old female with PMH of HTN, CKD, CHF, and COPD. She presented to [facility] ED 8/22 with complaints of headache, nausea, and vomiting. 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 ETT 8/22-29;  reintubated same day due to stridor; ETT 8/29-9/5; reintubated again due to stridor; tracheostomy 9/6.  Type of Study: Bedside Swallow Evaluation Previous Swallow Assessment: none in chart Diet Prior to this Study: NPO;NG Tube Temperature Spikes Noted: Yes(101.2) Respiratory Status: Trach;Trach Collar Trach Size and Type: Cuff;#6;Deflated;With PMSV not in place;With PMSV in place(valve on and off  during eval) History of Recent Intubation: Yes Length of Intubations (days): (see HPI) Date extubated: (see HPI) Behavior/Cognition: Lethargic/Drowsy;Requires cueing Oral Cavity Assessment: (limited opening for assessment) Oral Care Completed by SLP: Yes Oral Cavity - Dentition: Missing  dentition;Poor condition Vision: (eyes kept shut throughout eval) Patient Positioning: Upright in chair Baseline Vocal Quality: Hoarse;Low vocal intensity;Aphonic(mild amounts of dysphonic phonation with PMV on) Volitional Cough: Weak    Oral/Motor/Sensory Function Overall Oral Motor/Sensory Function: (inconsistently following commands; ?R facial droop)   Ice Chips Ice chips: Not tested   Thin Liquid Thin Liquid: Not tested    Nectar Thick Nectar Thick Liquid: Not tested   Honey Thick Honey Thick Liquid: Not tested   Puree Puree: Not tested   Solid     Solid: Not tested      Germain Osgood 04/14/2018,3:43 PM  Germain Osgood, M.A. [place] Acute Environmental education officer [phone] Office [phone]

## 2018-04-14 NOTE — Progress Notes (Signed)
Physical Therapy Treatment Patient Details Name: Amber Rogers MRN: [id number] DOB: 09-Dec-1940 Today's Date: 04/14/2018    History of Present Illness 77 year old female with PMH of HTN, CKD, CHF (EF 55% and grade 2 DD in 2016), and COPD. She presented to [facility] ED 8/22 with complaints of headache, nausea, and vomiting with onset of symptoms around 1200. She was found to be hypertensive. CT scan demonstrated hematoma in the left cerebellum with early hydrocephalus. Initially no neurosurgical intervention was indicated due to her reasonable mental status and dual antiplatelet regimen, however, her condition continued to decline and she required intubation. She was then deemed a candidate for EVD placement.    PT Comments    Pt making slower progress.  Emphasis on transitions to EOB, sitting balance, sit to stand and transfers.    Follow Up Recommendations  CIR     Equipment Recommendations  Other (comment)    Recommendations for Other Services       Precautions / Restrictions Precautions Precautions: Fall Precaution Comments: trach on vent    Mobility  Bed Mobility Overal bed mobility: Needs Assistance Bed Mobility: Supine to Sit     Supine to sit: +2 for physical assistance;Total assist     General bed mobility comments: Pt not alert enough initially to assist  Transfers Overall transfer level: Needs assistance   Transfers: Sit to/from Stand;Squat Pivot Transfers Sit to Stand: Max assist;+2 physical assistance   Squat pivot transfers: Max assist;+2 physical assistance     General transfer comment: pt needed cues and assist for direction and forward and boost assist for mobility, mostly due to decreased arousal.  Ambulation/Gait                 Stairs             Wheelchair Mobility    Modified Rankin (Stroke Patients Only) Modified Rankin (Stroke Patients Only) Pre-Morbid Rankin Score: No symptoms Modified Rankin: Moderately severe  disability     Balance Overall balance assessment: Needs assistance Sitting-balance support: Single extremity supported;Bilateral upper extremity supported Sitting balance-Leahy Scale: Poor Sitting balance - Comments: sat EOB with significant face to face assist initially due to posterior lean and pt's tendency to slide OOB.  After time and cuing pt able to sit upright and forward with minimal assist given from the side. Postural control: Posterior lean   Standing balance-Leahy Scale: Poor                              Cognition Arousal/Alertness: Lethargic Behavior During Therapy: Flat affect Overall Cognitive Status: Difficult to assess                                        Exercises      General Comments        Pertinent Vitals/Pain Pain Assessment: Faces Faces Pain Scale: Hurts a little bit Pain Location: generilize with ROM R LE Pain Descriptors / Indicators: Grimacing Pain Intervention(s): Monitored during session    Home Living                      Prior Function            PT Goals (current goals can now be found in the care plan section) Acute Rehab PT Goals Patient Stated Goal: none stated  trach PT Goal Formulation: With patient Time For Goal Achievement: 04/22/18 Potential to Achieve Goals: Fair Progress towards PT goals: Not progressing toward goals - comment(unable to fully arouse today)    Frequency    Min 3X/week      PT Plan Current plan remains appropriate    Co-evaluation              AM-PAC PT "6 Clicks" Daily Activity  Outcome Measure  Difficulty turning over in bed (including adjusting bedclothes, sheets and blankets)?: Unable Difficulty moving from lying on back to sitting on the side of the bed? : Unable Difficulty sitting down on and standing up from a chair with arms (e.g., wheelchair, bedside commode, etc,.)?: Unable Help needed moving to and from a bed to chair (including a  wheelchair)?: A Lot Help needed walking in hospital room?: Total Help needed climbing 3-5 steps with a railing? : Total 6 Click Score: 7    End of Session   Activity Tolerance: Patient tolerated treatment well;Patient limited by lethargy Patient left: in chair;with call bell/phone within reach;with family/visitor present Nurse Communication: Mobility status PT Visit Diagnosis: Other abnormalities of gait and mobility (R26.89);Other symptoms and signs involving the nervous system (R29.898)     Time: 3244-0102 PT Time Calculation (min) (ACUTE ONLY): 23 min  Charges:  $Therapeutic Activity: 23-37 mins                     04/14/2018  [name], PT Acute Rehabilitation Services [phone]  (pager) [phone]  (office)   Amber Rogers 04/14/2018, 7:08 PM

## 2018-04-14 NOTE — Progress Notes (Signed)
STROKE TEAM PROGRESS NOTE   INTERVAL HISTORY Patient's daughter and granddaughter are at bedside. Pt is lying in bed. No more fever. Still sleepy and not open eyes on voice. Able to follow some simple commands, but not all. Has acute urinary retention, needed I/O overnight. Will need foley.      Vitals:   04/14/18 0855 04/14/18 0857 04/14/18 0900 04/14/18 1016  BP:  (!) 165/55 112/63 (!) 171/60  Pulse:  76 71 66  Resp:  (!) 26 (!) 26   Temp:      TempSrc:      SpO2: 98% 98% 97%   Weight:      Height:        CBC:  Recent Labs  Lab 04/13/18 0911 04/14/18 0553  WBC 19.1* 18.1*  HGB 8.1* 7.1*  HCT 27.3* 23.9*  MCV 90.4 91.2  PLT 557* 533*    Basic Metabolic Panel:  Recent Labs  Lab 04/09/18 0500 04/11/18 0613  04/13/18 0911 04/14/18 0553  NA 137 139   < > 142 142  K 4.3 4.3   < > 3.9 3.7  CL 105 105   < > 107 111  CO2 26 23   < > 25 25  GLUCOSE 115* 120*   < > 117* 113*  BUN 30* 30*   < > 32* 32*  CREATININE 0.92 0.86   < > 1.03* 0.92  CALCIUM 8.1* 8.4*   < > 8.3* 8.1*  MG 2.1 2.2  --   --   --   PHOS 3.4 3.9  --   --   --    < > = values in this interval not displayed.   Lipid Panel:     Component Value Date/Time   CHOL 177 03/30/2018 0235   TRIG 67 03/30/2018 2248   HDL 101 03/30/2018 0235   CHOLHDL 1.8 03/30/2018 0235   VLDL 10 03/30/2018 0235   LDLCALC 66 03/30/2018 0235   HgbA1c:  Lab Results  Component Value Date   HGBA1C 5.5 03/30/2018   Urine Drug Screen:     Component Value Date/Time   LABOPIA NONE DETECTED 03/29/2018 1110   COCAINSCRNUR NONE DETECTED 03/29/2018 1110   LABBENZ NONE DETECTED 03/29/2018 1110   AMPHETMU NONE DETECTED 03/29/2018 1110   THCU NONE DETECTED 03/29/2018 1110   LABBARB NONE DETECTED 03/29/2018 1110    Alcohol Level     Component Value Date/Time   ETH <10 03/27/2018 1354    IMAGING  Ct Angio Head W Or Wo Contrast 03/27/2018 IMPRESSION:  1. No vascular malformation or abnormal enhancement of the brain  identified.  2. Stable cerebellar and intraventricular hemorrhage.  3. Patent anterior and posterior intracranial circulation. No large vessel occlusion, aneurysm, or significant stenosis.  4. Calcific atherosclerosis of the carotid and vertebral arteries with mild stenosis.    Ct Head Wo Contrast 03/28/2018 IMPRESSION:  1. Stable volume of intracranial hemorrhage within the left cerebellar hemisphere and the ventricular system. Some redistribution of blood products to the lateral ventricle atria and sylvian fissures.  2. No new acute intracranial abnormality.  3. Interval right frontal approach ventriculostomy catheter placement. Mild decrease in hydrocephalus.    Ct Head Wo Contrast 03/27/2018 IMPRESSION:  2 cm hematoma in the deep left cerebellum with fourth ventricular extension extending into the third/lateral ventricles and into the dorsal cervical canal. There is mild hydrocephalus at the lateral ventricles when compared to 2016. A deep cerebellar origin favors hypertensive etiology.   CUS 1-39% ICA  stenosis.  Vertebral artery flow is antegrade. Significant calcific plaque bilateral bifurcations, however, velocities are not significantly increased.   TTE - Left ventricle: The cavity size was normal. Wall thickness was   increased in a pattern of mild LVH. Systolic function was normal.   The estimated ejection fraction was in the range of 60% to 65%.   Doppler parameters are consistent with both elevated ventricular   end-diastolic filling pressure and elevated left atrial filling   pressure. - Aortic valve: There was mild stenosis. Valve area (VTI): 1.88   cm^2. Valve area (Vmax): 1.82 cm^2. Valve area (Vmean): 1.59   cm^2. - Mitral valve: Calcified annulus. - Atrial septum: No defect or patent foramen ovale was identified.  Ct Head Wo Contrast  Result Date: 03/30/2018 CLINICAL DATA:  77 y/o F; follow-up for intracranial hemorrhage, intraventricular hemorrhage, and  hydrocephalus. EXAM: CT HEAD WITHOUT CONTRAST TECHNIQUE: Contiguous axial images were obtained from the base of the skull through the vertex without intravenous contrast. COMPARISON:  03/28/2018 and 03/27/2018 CT head. FINDINGS: Brain: Stable volume acute hemorrhage within the left medial cerebellar hemisphere, ventricular system, and subarachnoid spaces of the sylvian fissures. There is some interval redistribution, for example decreased hemorrhage within the frontal horns of lateral ventricles, and increased pooling in the occipital horns. No new acute intracranial hemorrhage, stroke, or mass effect. Right frontal approach ventriculostomy catheter is stable in position traversing the frontal horn of right lateral ventricle. Stable ventricle size. Vascular: Calcific atherosclerosis of carotid siphons and vertebral arteries. Skull: Postsurgical changes related to a right frontal approach ventriculostomy catheter with decreased edema in the scalp. Sinuses/Orbits: Intubated patient. Maxillary sinus mucous retention cysts. Normal aeration of mastoid air cells. Orbits are unremarkable. Other: None. IMPRESSION: Stable volume of intraventricular, subarachnoid, and left cerebellar hemorrhage. Stable ventricle size. No new acute intracranial abnormality. Electronically Signed   By: Kristine Garbe M.D.   On: 03/30/2018 05:12    Ct Head Wo Contrast  Result Date: 04/01/2018 CLINICAL DATA:  77 y/o  F; follow-up of intracranial hemorrhage. EXAM: CT HEAD WITHOUT CONTRAST TECHNIQUE: Contiguous axial images were obtained from the base of the skull through the vertex without intravenous contrast. COMPARISON:  03/30/2018 CT head.  09/24/2014 CT head. FINDINGS: Brain: Partial interval dispersion of intraventricular, subarachnoid, and left cerebellar brain parenchymal hemorrhage. No new acute intracranial hemorrhage, stroke, or focal mass effect. Decreased size of the lateral and third ventricles, ventricle size now  similar to 2016 CT head. Stable position of right frontal approach ventriculostomy catheter with tip in the right lateral ventricle near the foramen of Monro. Vascular: Calcific atherosclerosis of carotid siphons and vertebral arteries. No hyperdense vessel identified. Skull: Right frontal burr hole postsurgical changes. Torus palatini and maxillaris. Sinuses/Orbits: Endotracheal and enteric tubes. Left maxillary sinus small mucous retention cyst. Visualized paranasal sinuses and mastoid air cells are otherwise normally aerated. Orbits are unremarkable. Other: None. IMPRESSION: 1. Partial interval dispersion of intraventricular, subarachnoid, and left cerebellar brain parenchymal hemorrhage. 2. Resolution of hydrocephalus. Stable position of right frontal approach ventriculostomy catheter. 3. No new acute intracranial process. Electronically Signed   By: Kristine Garbe M.D.   On: 04/01/2018 05:26   Dg Chest Port 1 View  Result Date: 04/01/2018 CLINICAL DATA:  Ventilator support EXAM: PORTABLE CHEST 1 VIEW COMPARISON:  03/31/2018 FINDINGS: Endotracheal tube tip is 2.5 cm above the carina. Nasogastric tube enters the stomach. Atelectasis in both lower lobes appears similar. Upper lungs remain clear. IMPRESSION: Endotracheal tube and nasogastric tube unchanged in well-positioned.  Persistent atelectasis in both lower lobes. Electronically Signed   By: Nelson Chimes M.D.   On: 04/01/2018 09:08   Ct Head Wo Contrast  Result Date: 04/02/2018 CLINICAL DATA:  Postop unresponsiveness. EXAM: CT HEAD WITHOUT CONTRAST TECHNIQUE: Contiguous axial images were obtained from the base of the skull through the vertex without intravenous contrast. COMPARISON:  Earlier today FINDINGS: Brain: Recurrent lateral and third ventricular hydrocephalus with ballooning of the horns. Clot is newly seen along the EVD catheter, which is presumably dysfunctional. There is also increased edema around the catheter which is likely  tracking CSF. No rebleeding is seen. There is stable left cerebellar, intraventricular, and subarachnoid clot. Negative for infarct. Vascular: Negative Skull: Unremarkable burr hole for EVD.  Right parietal osteoma. Sinuses/Orbits: Negative Other: During reading, case discussed with RN Amber Rogers and findings were expected. She is currently calling Dr Kathyrn Sheriff for orders. IMPRESSION: 1. Recurrent lateral and third ventricular hydrocephalus. Newly seen clot along the EVD, presumed cause of catheter dysfunction. 2. No interval bleeding. 3. Increased low-density along the EVD, likely tracking CSF. 4. No herniation or infarct. Electronically Signed   By: Monte Fantasia M.D.   On: 04/02/2018 13:14   Ct Head Wo Contrast  Result Date: 04/10/2018 CLINICAL DATA:  Follow-up examination for a communicating hydrocephalus. EXAM: CT HEAD WITHOUT CONTRAST TECHNIQUE: Contiguous axial images were obtained from the base of the skull through the vertex without intravenous contrast. COMPARISON:  Prior CT from 04/08/2018 FINDINGS: Brain: Ventriculomegaly overall not significantly changed relative to previous exam. Degenerating blood products seen layering within the occipital horns of the lateral ventricles. Right frontal approach ventriculostomy remains in place with tip near the foramen of Monro. Small amount of blood products along the ventricular tract with surrounding edema. Left cerebellar hematoma continues to evolve, slightly less prominent as compared to previous. Persistent surrounding vasogenic edema with mass effect and partial effacement on the adjacent fourth ventricle, slightly improved. Stable atrophy with chronic small vessel ischemic disease. No acute large vessel territory infarct. Chronic right cerebellar infarct noted. No extra-axial fluid collection. Vascular: No hyperdense vessel. Calcified atherosclerosis at the skull base. Skull: Right frontal burr hole with overlying skin staples. Scalp soft tissues and  calvarium demonstrate no acute new abnormality. Sinuses/Orbits: Globes and orbital soft tissues demonstrate no acute finding. Mild mucosal thickening within the maxillary sinuses. Bilateral mastoid effusions are similar to previous. Other: Multiple dental caries noted. IMPRESSION: 1. Continued interval evolution of left cerebellar hematoma with slightly improved localized vasogenic edema, with persistent but slightly improved mass effect on the adjacent fourth ventricle. 2. Intraventricular extension with similar small volume intraventricular blood products. Stable mild hydrocephalus with right frontal ventriculostomy in place. 3. No other new acute intracranial abnormality. Electronically Signed   By: Jeannine Boga M.D.   On: 04/10/2018 05:49   Dg Chest Port 1 View  Result Date: 04/11/2018 CLINICAL DATA:  Tracheostomy. EXAM: PORTABLE CHEST 1 VIEW COMPARISON:  04/10/2018. FINDINGS: Tracheostomy tube noted with tip over the trachea. Right PICC line stable position. Cardiomegaly. Bibasilar atelectasis. Bibasilar infiltrates/edema. Small left pleural effusion. IMPRESSION: 1. Tracheostomy tube in od anatomic position. Right PICC line noted with tip in stable position. 2.  Stable cardiomegaly. 3. Bibasilar atelectasis. Bibasilar infiltrates/edema. Small left pleural effusion. No pneumothorax. Electronically Signed   By: Marcello Moores  Register   On: 04/11/2018 10:38   Ct Head Wo Contrast  Result Date: 04/12/2018 CLINICAL DATA:  Follow-up intracranial hemorrhage. Interval removal of EVD. EXAM: CT HEAD WITHOUT CONTRAST TECHNIQUE: Contiguous axial images were  obtained from the base of the skull through the vertex without intravenous contrast. COMPARISON:  04/10/2018 FINDINGS: The study is mildly motion degraded. Brain: The ventriculostomy catheter has been removed in the interim with minimal blood again noted along the prior catheter tract in the right frontal lobe. A small locule of gas is noted in the temporal horn  of the right lateral ventricle. Small volume blood in the occipital horns of the lateral ventricles has decreased on the right and has slightly increased on the left reflecting some interval redistribution without significant change in overall volume. Left cerebellar hematoma demonstrates stable to slightly decreased density and no significant change in size. Surrounding vasogenic edema is also unchanged with partial effacement of the fourth ventricle. Mild lateral and third ventriculomegaly is unchanged. There is no midline shift or extra-axial fluid collection. No acute large territory infarct or new intracranial hemorrhage is identified. Patchy cerebral white matter hypodensities are unchanged and nonspecific but compatible with moderate chronic small vessel ischemic disease. A small chronic infarct is again noted in the right cerebellum. Vascular: Calcified atherosclerosis at the skull base. No hyperdense vessel. Skull: Right frontal burr hole with adjacent skin staples remaining in place. Small right lateral skull osteoma. Sinuses/Orbits: Grossly unremarkable orbits within limitations of motion. No evidence of acute inflammatory paranasal sinus disease. Similar appearance of small bilateral mastoid effusions. Other: None. IMPRESSION: 1. Interval EVD removal with unchanged mild ventriculomegaly. 2. Unchanged left cerebellar hematoma and surrounding vasogenic edema. 3. Small volume intraventricular hemorrhage without significant change. 4. No evidence of new intracranial abnormality. Electronically Signed   By: Logan Bores M.D.   On: 04/12/2018 19:22     PHYSICAL EXAM  Temp:  [97.2 F (36.2 C)-99.8 F (37.7 C)] 97.2 F (36.2 C) (09/09 0800) Pulse Rate:  [60-76] 66 (09/09 1016) Resp:  [16-31] 26 (09/09 0900) BP: (112-172)/(47-88) 171/60 (09/09 1016) SpO2:  [91 %-100 %] 97 % (09/09 0900) FiO2 (%):  [35 %-40 %] 40 % (09/09 0900) Weight:  [66.8 kg] 66.8 kg (09/09 0500)  General - Well nourished,  well developed, on trach collar.  Ophthalmologic - fundi not visualized due to noncooperation.  Cardiovascular - Regular rate and rhythm.  Neuro - on trach collar, eyes closed, but able to open eyes on voice, able to mouth the word "OK". She follows some simple commands this morning with showing thumb and two fingers but not wiggle toes. Pupils are equal and reactive to light. Eyes left gazed preference today. Blinking to visual threat on the left but not consistent on the right. Corneal reflexes are brinsk. Seems to have right facial droop. Motor system exam she is able to withdraw all 4 extremities with painful stimuli, arms>legs. Able to against gravity BUE 3+/5 and BLE 3/5 on pain. Increased muscle tone BUEs. Deep tendon reflexes are 1+ symmetric, but babinski positive bilaterally. Sensation, coordination and gait not tested.   ASSESSMENT/PLAN Amber Rogers is a 77 y.o. female with history of COPD, CHF, HTN, HLD, CKD III, CAD s/p stent presenting with sudden onset nausea, vomiting and dizziness.   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  Mild hydrocephalus lateral ventricles.    CTA head no vascular malformation or abnormal enhancement.  Stable cerebellar  and IVH.  No LVO.  Intracranial atherosclerosis.  Repeat CT x 3 - Stable volume of intracranial hemorrhage and hydrocephalus  CUS - Significant calcific plaque bilateral bifurcations, however, velocities are not significantly increased.  2D Echo EF 60-65%  UDS neg  LDL 66  HgbA1c 5.5  Heparin subq for VTE prophylaxis  NPO on bolus feeds  aspirin 81 mg daily and Brilinta 90 mg twice daily prior to admission,  now on no antithrombotics  Therapy recommendations:  Pending  Disposition:  pending   Obstructive hydrocephalus s/p R frontal EVD   EVD placed 8/22, replaced 8/28  CT showed recurrent hydro prior to EVD replacement  Repeat CT stable after EVD replacement  Repeat CT stable 9/5 after 48h of EVD clamping  EVD removed on 04/10/18  NSG signed off  CT repeat 04/12/18 stable mild hydrocephalus  Leukocytosis  Temp 101.2 -> normal  UA neg for UTI,   CXR tiny left effusion  Blood culture NNGTD  Leukocytosis - 15.3->17.5->15.5->17.7->19.1->18.1  On home prednisone for COPD  Carotid stenosis   CUS 08/2017 R ICA stenosis 50-69%, L ICA < 50%  CUS repeat showed b/l ICA 1-39% stenosis but significant calcified plaque at carotid bifurcation bilaterally.   Acute Hypoxic Respiratory failure - extubation and re-intubation  Intubated in the ED with neuro decline   CCM on board  Extubated and re-intubated 04/03/18  Trach done 04/11/18  Currently on trach collar, tolerating well  Hypertensive Emergency  BP stable off cardene  Home Meds: Norvasc 10, resumed  Currently on norvasc 10 and hydralazine 50 Q8  SBP goal < 160  AKI on CKD  Cre 1.36-1.43-1.57-0.86-1.08->1.03->0.92  On IVF to 50cc  D/c free water  Na 138->142->142  Continue monitoring  Hyperlipidemia  Home meds:  ?? Lipitor 80   LDL 66  Consider to resume statin on discharge  Other Stroke Risk Factors  Advanced age  Former Cigarette smoker, quit 2 years ago  Coronary artery disease s/p stent - on ASA and brilinta PTA  Chronic Congestive heart failure  Other Active Problems  Hx vertigo   COPD - on home prednisone per tube  Hospital day # 18  This patient is critically ill due to cerebellar ICH with IVH and hydrocephalus, respiratory failure, hypertensive emergency and at significant risk of neurological worsening, death form recurrent bleeding, hydrocephalus, increased ICP, cerebral edema  and brain death, seizure. This patient's care requires constant monitoring of vital signs, hemodynamics, respiratory and cardiac monitoring, review of multiple databases, neurological assessment, discussion with family, other specialists and medical decision making of high complexity. I spent 40 minutes of neurocritical care time in the care of this patient. I had long discussion with daughter and granddaughter at bedside, updated pt current condition, treatment plan and potential prognosis. They expressed understanding and appreciation.    Rosalin Hawking, MD PhD Stroke Neurology 04/14/2018 11:56 AM     To contact Stroke Continuity provider, please refer to [email or web address] After hours, contact General Neurology

## 2018-04-14 NOTE — Progress Notes (Addendum)
  Speech Language Pathology Treatment: Nada Boozer Speaking valve  Patient Details Name: Amber Rogers MRN: [id number] DOB: 1941/05/10 Today's Date: 04/14/2018 Time: 5170-0174 SLP Time Calculation (min) (ACUTE ONLY): 8 min  Assessment / Plan / Recommendation Clinical Impression  Pt is lethargic this afternoon, requiring Mod-Max cues for brief periods of arousal. Even when following commands, she keeps her eyes closed. She tolerated cuff deflation well with subtle, audible secretions which she could not clear despite cued volitional coughing. PMV was placed for only a few respiratory cycles at a time before pt would begin to get restless and grab toward her trach. Intermittent air trapping was noted upon removal. No significant changes in VS were observed, although valve was not left on for more than a few seconds at a time. Recommend continued use with SLP only at this time, although would attempt more periods of cuff deflation. RN made aware that cuff was left deflated upon completion of session. PMV handout was given to pt's dtr with education provided.  HPI        SLP Plan  Continue with current plan of care       Recommendations         Patient may use Passy-Muir Speech Valve: with SLP only PMSV Supervision: Full         Oral Care Recommendations: Oral care QID Follow up Recommendations: Inpatient Rehab SLP Visit Diagnosis: Aphonia (R49.1) Plan: Continue with current plan of care       GO                Germain Osgood 04/14/2018, 2:51 PM  Germain Osgood, M.A. [place] Acute Environmental education officer [phone] Office [phone]

## 2018-04-15 DIAGNOSIS — I10 Essential (primary) hypertension: Secondary | ICD-10-CM

## 2018-04-15 DIAGNOSIS — I615 Nontraumatic intracerebral hemorrhage, intraventricular: Secondary | ICD-10-CM

## 2018-04-15 DIAGNOSIS — I2510 Atherosclerotic heart disease of native coronary artery without angina pectoris: Secondary | ICD-10-CM

## 2018-04-15 DIAGNOSIS — E785 Hyperlipidemia, unspecified: Secondary | ICD-10-CM

## 2018-04-15 DIAGNOSIS — D62 Acute posthemorrhagic anemia: Secondary | ICD-10-CM

## 2018-04-15 DIAGNOSIS — J449 Chronic obstructive pulmonary disease, unspecified: Secondary | ICD-10-CM

## 2018-04-15 DIAGNOSIS — J40 Bronchitis, not specified as acute or chronic: Secondary | ICD-10-CM

## 2018-04-15 DIAGNOSIS — I5032 Chronic diastolic (congestive) heart failure: Secondary | ICD-10-CM

## 2018-04-15 DIAGNOSIS — D72829 Elevated white blood cell count, unspecified: Secondary | ICD-10-CM

## 2018-04-15 DIAGNOSIS — E876 Hypokalemia: Secondary | ICD-10-CM

## 2018-04-15 DIAGNOSIS — I69391 Dysphagia following cerebral infarction: Secondary | ICD-10-CM

## 2018-04-15 LAB — CBC
HCT: 22.9 % — ABNORMAL LOW (ref 36.0–46.0)
HEMOGLOBIN: 6.9 g/dL — AB (ref 12.0–15.0)
MCH: 27 pg (ref 26.0–34.0)
MCHC: 30.1 g/dL (ref 30.0–36.0)
MCV: 89.5 fL (ref 78.0–100.0)
Platelets: 613 10*3/uL — ABNORMAL HIGH (ref 150–400)
RBC: 2.56 MIL/uL — AB (ref 3.87–5.11)
RDW: 16 % — ABNORMAL HIGH (ref 11.5–15.5)
WBC: 15.1 10*3/uL — ABNORMAL HIGH (ref 4.0–10.5)

## 2018-04-15 LAB — OCCULT BLOOD X 1 CARD TO LAB, STOOL: FECAL OCCULT BLD: POSITIVE — AB

## 2018-04-15 LAB — GLUCOSE, CAPILLARY
GLUCOSE-CAPILLARY: 119 mg/dL — AB (ref 70–99)
GLUCOSE-CAPILLARY: 120 mg/dL — AB (ref 70–99)
Glucose-Capillary: 104 mg/dL — ABNORMAL HIGH (ref 70–99)
Glucose-Capillary: 117 mg/dL — ABNORMAL HIGH (ref 70–99)
Glucose-Capillary: 137 mg/dL — ABNORMAL HIGH (ref 70–99)
Glucose-Capillary: 118 mg/dL — ABNORMAL HIGH (ref 70–99)

## 2018-04-15 LAB — BASIC METABOLIC PANEL
Anion gap: 7 (ref 5–15)
BUN: 24 mg/dL — ABNORMAL HIGH (ref 8–23)
CALCIUM: 7.9 mg/dL — AB (ref 8.9–10.3)
CHLORIDE: 111 mmol/L (ref 98–111)
CO2: 24 mmol/L (ref 22–32)
Creatinine, Ser: 0.85 mg/dL (ref 0.44–1.00)
Glucose, Bld: 120 mg/dL — ABNORMAL HIGH (ref 70–99)
Potassium: 3.4 mmol/L — ABNORMAL LOW (ref 3.5–5.1)
SODIUM: 142 mmol/L (ref 135–145)

## 2018-04-15 LAB — CULTURE, RESPIRATORY

## 2018-04-15 LAB — HEMOGLOBIN AND HEMATOCRIT, BLOOD
HCT: 26.9 % — ABNORMAL LOW (ref 36.0–46.0)
Hemoglobin: 8.1 g/dL — ABNORMAL LOW (ref 12.0–15.0)

## 2018-04-15 LAB — CULTURE, RESPIRATORY W GRAM STAIN

## 2018-04-15 LAB — IRON AND TIBC
Iron: 130 ug/dL (ref 28–170)
SATURATION RATIOS: 59 % — AB (ref 10.4–31.8)
TIBC: 221 ug/dL — ABNORMAL LOW (ref 250–450)
UIBC: 91 ug/dL

## 2018-04-15 LAB — VITAMIN B12: VITAMIN B 12: 403 pg/mL (ref 180–914)

## 2018-04-15 LAB — LACTATE DEHYDROGENASE: LDH: 166 U/L (ref 98–192)

## 2018-04-15 LAB — FERRITIN: Ferritin: 94 ng/mL (ref 11–307)

## 2018-04-15 LAB — PREPARE RBC (CROSSMATCH)

## 2018-04-15 LAB — FOLATE: Folate: 14.8 ng/mL (ref >5.9)

## 2018-04-15 MED ORDER — PRO-STAT SUGAR FREE PO LIQD
30.0000 mL | Freq: Every day | ORAL | Status: DC
  Administered 2018-04-15 – 2018-04-22 (×8): 30 mL
  Filled 2018-04-15 (×9): qty 30

## 2018-04-15 MED ORDER — CEFAZOLIN SODIUM-DEXTROSE 2-4 GM/100ML-% IV SOLN
2.0000 g | Freq: Three times a day (TID) | INTRAVENOUS | Status: DC
  Administered 2018-04-15 – 2018-04-19 (×12): 2 g via INTRAVENOUS
  Filled 2018-04-15 (×13): qty 100

## 2018-04-15 MED ORDER — OSMOLITE 1.5 CAL PO LIQD
237.0000 mL | Freq: Four times a day (QID) | ORAL | Status: DC
  Administered 2018-04-15 – 2018-04-17 (×8): 237 mL
  Filled 2018-04-15 (×9): qty 237

## 2018-04-15 MED ORDER — SODIUM CHLORIDE 0.9% IV SOLUTION: Freq: Once | INTRAVENOUS | Status: DC

## 2018-04-15 MED ORDER — PREDNISONE 5 MG PO TABS
2.5000 mg | ORAL_TABLET | Freq: Every day | ORAL | Status: DC
  Administered 2018-04-16 – 2018-04-22 (×7): 2.5 mg
  Filled 2018-04-15 (×8): qty 1

## 2018-04-15 MED ORDER — PRO-STAT SUGAR FREE PO LIQD: 30.0000 mL | Freq: Every day | ORAL | Status: DC

## 2018-04-15 MED ORDER — JEVITY 1.2 CAL PO LIQD: 1000.0000 mL | ORAL | Status: DC

## 2018-04-15 NOTE — Progress Notes (Signed)
CRITICAL VALUE ALERT  Critical Value:  Hgb 6.9 Date & Time Notied:  04/15/18 @0447   Provider Notified: Dr. Cheral Marker  Orders Received/Actions taken: yes, see epic

## 2018-04-15 NOTE — Progress Notes (Signed)
PULMONARY / CRITICAL CARE MEDICINE   NAME:  Amber Rogers, MRN:  [id number], DOB:  1940-12-16, LOS: 72 ADMISSION DATE:  03/27/2018, CONSULTATION DATE:  03/27/2018 REFERRING MD:  Dr. Leonel Ramsay, Neurology, CHIEF COMPLAINT:  Headache  HISTORY OF PRESENT ILLNESS:   77 yo female former smoker presented with HA, N/V and noted to be hypertensive.  CT head showed hematoma in Lt cerebellum with hydrocephalus.  Required intubation for airway protection.  She had EVD by neurosurgery.  Failed extubation attempts on 8/28 and 9/05 due to stridor.  Had tracheostomy done on 9/06.  PAST MEDICAL HISTORY: CAD, CKD 3, Diastolic CHF, HLD  SIGNIFICANT EVENTS: 8/22 Admit, EVD placed 8/26 Fever, add ABx 8/28 replaced EVD (clogged) 8/29 failed extubation (stridor) 9/05 failed extubation (stridor) 9/06 trach 9/08 fever 9/10 transfuse PRBC, increased respiratory secretions >> start ABx  STUDIES:  CT head 8/22 >> 2 cm hematoma Lt cerebellum with extension to 4th ventricle, mild hydrocephalus Echo 8/25 >> mild LVH, EF 60 to 65%, mild AS  CULTURES: Sputum 8/26 >> oral flora Blood 8/26 >> negative CSF 8/29 >> negative Sputum 9/03 >> Candida Blood 9/08 >>  Sputum 9/08 >> Few MSSA  ANTIBIOTICS: Cefepime 8/26 >> 9/01 Vancomycin 8/26 >> 9/01 Ancef 9/10 >>   LINES/TUBES: Trach 9/06 >>   CONSULTANTS: Neurosurgery 8/22 hydrocephalus >> CIR 9/10 >>   SUBJECTIVE: More lethargic.  Hb dropped.  Has increased secretions that are thick/yellow.  CONSTITUTIONAL: BP (!) 144/62 (BP Location: Left Arm)   Pulse 70   Temp 99.2 F (37.3 C) (Axillary) Comment: axilla  Resp (!) 27   Ht 5\' 6"  (1.676 m)   Wt 72.6 kg   SpO2 98%   BMI 25.83 kg/m   I/O last 3 completed shifts: In: 2075.5 [I.V.:1107.5; NG/GT:968] Out: 2900 [Urine:2900]  FiO2 (%):  [35 %] 35 %  PHYSICAL EXAM:  General - somnelent Eyes - pupils reactive ENT - trach site with yellow secretions Cardiac - regular rate/rhythm, no murmur Chest -  b/l rhonchi Abdomen - soft, non tender, + bowel sounds Extremities - no cyanosis, clubbing, or edema Skin - no rashes Neuro - not following commands  CXR 04/13/18 (reviewed by me) >> no infiltrate  ASSESSMENT/PLAN:  Compromised airway in setting of ICH. Post extubation stridor. S/p tracheostomy. - trach care - plan to d/c trach sutures 9/14  COPD. 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 - neurology following - PT recommending CIR  Dysphagia. - f/u with speech therapy  Fever 9/08 with increase respiratory secretions. - has MSSA in sputum - will add ancef - f/u CXR 9/11  Hypertension. - goal SBP < 160  CKD 2. - monitor renal fx  Anemia. - f/u CBC after transfusion 9/10 - check iron levels  SUMMARY: Decreased mental status might be related to developing MSSA tracheobronchitis.  Will restart ABx and f/u CXR.  Continue trach care.  DVT PROPHYLAXIS: SCDs SUP: Protonix NUTRITION: Tube feeds MOBILITY: f/u with PT GOALS OF CARE: Full code FAMILY DISCUSSIONS: Updated family at bedside  LABS:  Glucose Recent Labs  Lab 04/14/18 1531 04/14/18 2004 04/14/18 2308 04/15/18 0346 04/15/18 0420 04/15/18 0754  GLUCAP 140* 124* 116* 120* 119* 104*    BMET Recent Labs  Lab 04/13/18 0911 04/14/18 0553 04/15/18 0447  NA 142 142 142  K 3.9 3.7 3.4*  CL 107 111 111  CO2 25  25 24  BUN 32* 32* 24*  CREATININE 1.03* 0.92 0.85  GLUCOSE 117* 113* 120*    Liver Enzymes No results for input(s): AST, ALT, ALKPHOS, BILITOT, ALBUMIN in the last 168 hours.  Electrolytes Recent Labs  Lab 04/09/18 0500 04/11/18 0613  04/13/18 0911 04/14/18 0553 04/15/18 0447  CALCIUM 8.1* 8.4*   < > 8.3* 8.1* 7.9*  MG 2.1 2.2  --   --   --   --   PHOS 3.4 3.9  --   --   --   --    < > = values in this interval not displayed.    CBC Recent Labs  Lab  04/13/18 0911 04/14/18 0553 04/15/18 0447  WBC 19.1* 18.1* 15.1*  HGB 8.1* 7.1* 6.9*  HCT 27.3* 23.9* 22.9*  PLT 557* 533* 613*    ABG No results for input(s): PHART, PCO2ART, PO2ART in the last 168 hours.  Coag's Recent Labs  Lab 04/11/18 0613  INR 1.06    Sepsis Markers No results for input(s): LATICACIDVEN, PROCALCITON, O2SATVEN in the last 168 hours.  Cardiac Enzymes No results for input(s): TROPONINI, PROBNP in the last 168 hours.  Imaging No results found.   Chesley Mires, MD [facility] Pulmonary/Critical Care 04/15/2018, 12:12 PM

## 2018-04-15 NOTE — Progress Notes (Signed)
Nutrition Follow-up  DOCUMENTATION CODES:   Not applicable  INTERVENTION:   - 1 can (237 ml) Osmolite 1.5 four times per day via cortrak - 30 ml Prostat once daily - Free water flushes 200 ml Q6 hours  Provides: 1520 kcals, 75 grams protein, 724 ml free water (1524 ml with flushes). Meets 100% of needs.   NUTRITION DIAGNOSIS:   Inadequate oral intake related to acute illness as evidenced by NPO status.  Ongoing  GOAL:   Patient will meet greater than or equal to 90% of their needs  Meeting with TF  MONITOR:   Vent status, Labs, Weight trends, TF tolerance  REASON FOR ASSESSMENT:   Ventilator    ASSESSMENT:   77 yo female admitted with deep left cerebellar hemorrhage with 3rd and 4th ventricle extension and mild hydrocephalus. Pt required intubation for airway protection. Pt with hx of HTN, CKD   8/22 - R frontal ventriculostomy 8/27 - EVD became non-functional in the evening 8/28 - ETT replaced, EVD replaced, failed extubation 8/29- failed extubation 9/3- tracheal aspiration 9/6- tracheostomy placed, gastric cortrak placed 9/10- pt changed to bolus feedings by MD  SLP saw pt yesterday and recommend pt to remain NPO due to lack of alertness. Pt receiving bolus feedings of Vital AF 1.2 (240 ml) three times daily to provide 864 kcal and 54 grams of protein. This does not meet calorie or protein needs. Pt currently on trach collar, nutrition needs adjusted. Will change TF to better meet pt needs.   RN reports pt having looser stools with some formation. Will continue to monitor.   Weight noted increase from 147 lb on 9/4 to 160 lb today.   Medications reviewed and include: prednisone- weaning, NS @ 50 ml/hr Labs reviewed: K 3.4 (L)   Diet Order:   Diet Order            Diet NPO time specified  Diet effective midnight              EDUCATION NEEDS:   No education needs have been identified at this time  Skin:  Skin Assessment: Reviewed RN  Assessment  Last BM:  04/07/18  Height:   Ht Readings from Last 1 Encounters:  04/08/18 5\' 6"  (1.676 m)    Weight:   Wt Readings from Last 1 Encounters:  04/15/18 72.6 kg    Ideal Body Weight:  59.1 kg  BMI:  Body mass index is 25.83 kg/m.  Estimated Nutritional Needs:   Kcal:  1400-1600 kcal  Protein:  70-85 grams  Fluid:  >/= 1.5 L/day  Mariana Single RD, LDN Clinical Nutrition Pager # - [phone]

## 2018-04-15 NOTE — Progress Notes (Signed)
Hgb critically low at 6.9. Has been low for several days but has not dropped below 7. Type and cross with transfusion of 2 units PRBC has been ordered. Patient has a diagnosis of CHF but most recent echo with EF of 60-65%, therefore will not order Lasix with the transfusion. Discussed with blood bank.   Electronically signed: Dr. Kerney Elbe

## 2018-04-15 NOTE — Progress Notes (Signed)
Pharmacy Antibiotic Note  Amber Rogers is a 77 y.o. female admitted on 03/27/2018 with tracheobronchitis.  Pharmacy has been consulted for Ancef dosing.  Height: 5\' 6"  (167.6 cm) Weight: 160 lb 0.9 oz (72.6 kg) IBW/kg (Calculated) : 59.3  Temp (24hrs), Avg:98.7 F (37.1 C), Min:98.1 F (36.7 C), Max:99.3 F (37.4 C)  Recent Labs  Lab 04/11/18 0613 04/12/18 0500 04/13/18 0911 04/14/18 0553 04/15/18 0447  WBC 15.5* 17.7* 19.1* 18.1* 15.1*  CREATININE 0.86 1.08* 1.03* 0.92 0.85    Estimated Creatinine Clearance: 58.3 mL/min (by C-G formula based on SCr of 0.85 mg/dL).    No Known Allergies  ID: MSSA tracheobronchitisTm 99.9, WBC 15.1 down slightly (on steroids). Scr 0.95 down.  Antimicrobials this admission: 8/26 Cefepime >> 9/2 8/26 Vancomycin >> 9/2 9/10 Ancef>>  Dose adjustments this admission: 8/30 VT 12 incr to 1250 q24hr (goal 15 - 20)  Microbiology results: 8/25 BCx: negative 8/26 Trach asp:  normal flora 8/23 MRSA PCR: negative 8/29 CSF - NGTD 9/3 Trach asp - few candida albicans 9/8 Bld>> 9/8 Trach aspirate - MSSA  Plan: Ancef 2g IV q 8 hrs Pharmacy will sign off. Please reconsult for further dosing assitance.   Peyton Spengler S. Alford Highland, PharmD, [place] Clinical Staff Pharmacist [phone]  [place], [place] 04/15/2018 1:06 PM

## 2018-04-15 NOTE — Care Management Important Message (Signed)
Important Message  Patient Details  Name: Amber Rogers MRN: [id number] Date of Birth: [birth year]   Medicare Important Message Given:  Yes    Kristoffer Bala 04/15/2018, 1:50 PM

## 2018-04-15 NOTE — Progress Notes (Addendum)
STROKE TEAM PROGRESS NOTE   INTERVAL HISTORY Patient's RN is at bedside. Pt is lying in bed. Hgb down to 6.9 during the night, now getting second bag of PRBCs. Remains lethartic, not opening eyes or following commands. Sputum cx positive MSSA, started on ancef. Has foley for urinary retention.   Vitals:   04/15/18 1431 04/15/18 1516 04/15/18 1621 04/15/18 1637  BP:    (!) 169/68  Pulse:  74    Resp:  (!) 26    Temp:   99.1 F (37.3 C) 99.2 F (37.3 C)  TempSrc:   Oral Axillary  SpO2: 91% 95%    Weight:      Height:        CBC:  Recent Labs  Lab 04/14/18 0553 04/15/18 0447  WBC 18.1* 15.1*  HGB 7.1* 6.9*  HCT 23.9* 22.9*  MCV 91.2 89.5  PLT 533* 613*    Basic Metabolic Panel:  Recent Labs  Lab 04/09/18 0500 04/11/18 0613  04/14/18 0553 04/15/18 0447  NA 137 139   < > 142 142  K 4.3 4.3   < > 3.7 3.4*  CL 105 105   < > 111 111  CO2 26 23   < > 25 24  GLUCOSE 115* 120*   < > 113* 120*  BUN 30* 30*   < > 32* 24*  CREATININE 0.92 0.86   < > 0.92 0.85  CALCIUM 8.1* 8.4*   < > 8.1* 7.9*  MG 2.1 2.2  --   --   --   PHOS 3.4 3.9  --   --   --    < > = values in this interval not displayed.   Lipid Panel:     Component Value Date/Time   CHOL 177 03/30/2018 0235   TRIG 67 03/30/2018 2248   HDL 101 03/30/2018 0235   CHOLHDL 1.8 03/30/2018 0235   VLDL 10 03/30/2018 0235   LDLCALC 66 03/30/2018 0235   HgbA1c:  Lab Results  Component Value Date   HGBA1C 5.5 03/30/2018   Urine Drug Screen:     Component Value Date/Time   LABOPIA NONE DETECTED 03/29/2018 1110   COCAINSCRNUR NONE DETECTED 03/29/2018 1110   LABBENZ NONE DETECTED 03/29/2018 1110   AMPHETMU NONE DETECTED 03/29/2018 1110   THCU NONE DETECTED 03/29/2018 1110   LABBARB NONE DETECTED 03/29/2018 1110    Alcohol Level     Component Value Date/Time   ETH <10 03/27/2018 1354    IMAGING Ct Head Wo Contrast 03/27/2018 IMPRESSION:  2 cm hematoma in the deep left cerebellum with fourth ventricular  extension extending into the third/lateral ventricles and into the dorsal cervical canal. There is mild hydrocephalus at the lateral ventricles when compared to 2016. A deep cerebellar origin favors hypertensive etiology.   Ct Angio Head W Or Wo Contrast 03/27/2018 IMPRESSION:  1. No vascular malformation or abnormal enhancement of the brain identified.  2. Stable cerebellar and intraventricular hemorrhage.  3. Patent anterior and posterior intracranial circulation. No large vessel occlusion, aneurysm, or significant stenosis.  4. Calcific atherosclerosis of the carotid and vertebral arteries with mild stenosis.   Ct Head Wo Contrast 03/28/2018  1. Stable volume of intracranial hemorrhage within the left cerebellar hemisphere and the ventricular system. Some redistribution of blood products to the lateral ventricle atria and sylvian fissures.   2. No new acute intracranial abnormality.   3. Interval right frontal approach ventriculostomy catheter placement. Mild decrease in hydrocephalus.  03/30/2018  Stable volume  of intraventricular, subarachnoid, and left cerebellar hemorrhage. Stable ventricle size. No new acute intracranial abnormality.  04/01/2018  1. Partial interval dispersion of intraventricular, subarachnoid, and left cerebellar brain parenchymal hemorrhage. 2. Resolution of hydrocephalus. Stable position of right frontal approach ventriculostomy catheter. 3. No new acute intracranial process.  04/02/2018  1. Recurrent lateral and third ventricular hydrocephalus. Newly seen clot along the EVD, presumed cause of catheter dysfunction. 2. No interval bleeding. 3. Increased low-density along the EVD, likely tracking CSF. 4. No herniation or infarct.  04/10/2018  1. Continued interval evolution of left cerebellar hematoma with slightly improved localized vasogenic edema, with persistent but slightly improved mass effect on the adjacent fourth ventricle. 2. Intraventricular extension with similar  small volume intraventricular blood products.  Stable mild hydrocephalus with right frontal ventriculostomy in place. 3. No other new acute intracranial abnormality.  04/12/2018  1. Interval EVD removal with unchanged mild ventriculomegaly. 2. Unchanged left cerebellar hematoma and surrounding vasogenic edema. 3. Small volume intraventricular hemorrhage without significant change. 4. No evidence of new intracranial abnormality.    CUS 1-39% ICA stenosis.  Vertebral artery flow is antegrade. Significant calcific plaque bilateral bifurcations, however, velocities are not significantly increased.   TTE - Left ventricle: The cavity size was normal. Wall thickness was increased in a pattern of mild LVH. Systolic function was normal. The estimated ejection fraction was in the range of 60% to 65%. Doppler parameters are consistent with both elevated ventricular end-diastolic filling pressure and elevated left atrial filling pressure. - Aortic valve: There was mild stenosis. Valve area (VTI): 1.88 cm^2. Valve area (Vmax): 1.82 cm^2. Valve area (Vmean): 1.59 cm^2. - Mitral valve: Calcified annulus. - Atrial septum: No defect or patent foramen ovale was identified.   PHYSICAL EXAM General - Well nourished, well developed, on trach collar. Cardiovascular - Regular rate and rhythm.  Neuro - on trach collar, eyes closed, does not open to stimulation, fights to keep closed when examiner opens. No attempted verbal interaction today. No speech/sounds. She did not follow commands. Pupils are equal and reactive to light. Eyes left gazed preference. Blinking to visual threat on the left but not on the right. Corneal reflexes are brinsk. Seems to have right facial droop. Motor system exam she is able to withdraw all 4 extremities with painful stimuli, arms>legs. MAEx4. Able to against gravity BUE 3+/5 and BLE 3/5 on pain. Increased muscle tone BUEs. Deep tendon reflexes are 1+ symmetric, but babinski positive  bilaterally. Sensation, coordination and gait not tested.   ASSESSMENT/PLAN Amber Rogers is a 77 y.o. female with history of COPD, CHF, HTN, HLD, CKD III, CAD s/p stent presenting with sudden onset nausea, vomiting and dizziness.   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  Mild hydrocephalus lateral ventricles.    CTA head no vascular malformation or abnormal enhancement.  Stable cerebellar and IVH.  No LVO.  Intracranial atherosclerosis.  Repeat CT x 3 - Stable volume of intracranial hemorrhage and hydrocephalus  CUS - Significant calcific plaque bilateral bifurcations, however, velocities are not significantly increased.  2D Echo EF 60-65%  UDS neg  LDL 66  HgbA1c 5.5  Heparin subq for VTE prophylaxis  NPO on tube feeds  aspirin 81 mg daily and  Brilinta 90 mg twice daily prior to admission, now on no antithrombotics d/t hmg  Therapy recommendations:  CIR  Disposition:  pending   Obstructive hydrocephalus s/p R frontal EVD   EVD placed 8/22, replaced 8/28  CT showed recurrent hydro prior to EVD replacement  Repeat CT stable after EVD replacement  Repeat CT stable 9/5 after 48h of EVD clamping  EVD removed on 04/10/18  NSG signed off  CT repeat 04/12/18 stable mild hydrocephalus  Tracheobronchitis Leukocytosis  Temp 101.2 -> normal  UA neg for UTI,   CXR tiny left effusion  Blood culture NNGTD  Leukocytosis - 15.3->17.5->15.5->17.7->19.1->18.1->15.1  On home prednisone for COPD  Sputum Cx +MSSA  Started on ancef 9/10 by CCM   Repeat CXR in am  Carotid stenosis   CUS 08/2017 R ICA stenosis 50-69%, L ICA < 50%  CUS repeat showed  b/l ICA 1-39% stenosis but significant calcified plaque at carotid bifurcation bilaterally.   Acute Hypoxic Respiratory failure - extubation and re-intubation  Intubated in the ED with neuro decline   CCM on board  Extubated and re-intubated 04/03/18  Trach done 04/11/18  Currently on trach collar, tolerating well  Hypertensive Emergency  BP stable off cardene  Home Meds: Norvasc 10, resumed  Currently on norvasc 10 and hydralazine 50 Q8  SBP goal < 160  AKI on CKD  Cre 1.36-1.43-1.57-0.86-1.08->1.03->0.92  On IVF to 50cc  D/c free water  Na 138->142->142  Continue monitoring  Hyperlipidemia  Home meds:  ?? Lipitor 80   LDL 66  Consider to resume statin on discharge  Other Stroke Risk Factors  Advanced age  Former Cigarette smoker, quit 2 years ago  Coronary artery disease s/p stent - on ASA and brilinta PTA  Chronic Congestive heart failure  Anemia.   Hgb 6.9. Essentially stable this admission 7.8->6.9.   Last documented 11.0 Feb 2018  2u PRBCs.  Hgb 8.1 post transfurtion  check stool guiac pending   Check Fer, VB12, lactate, haptoglobin, Folate, FE studies pending   Other Active Problems  Hx vertigo   COPD - on home prednisone per tube  Hypokalemia 3.4  Hospital day # [place], MSN, APRN, ANVP-BC, AGPCNP-BC Advanced Practice Stroke Nurse [place] for Schedule & Pager information 04/15/2018 4:51 PM   ATTENDING NOTE: I reviewed above note and agree with the assessment and plan. Pt was seen and examined.   Patient lying in bed, on trach collar, slightly more weak than yesterday, able to open eyes on voice, follows simple peripheral commands with several prompts.  However, still drowsy and sleepy, not very interactive with provider.  Moving all extremities on stimulation.  Hemoglobin dropped to 6.9 this a.m., received 2 PRBC transfusion, repeat hemoglobin 8.1.  Will do anemia work-up and fecal occult  blood testing.  Rosalin Hawking, MD PhD Stroke Neurology 04/15/2018 9:58 PM     To contact Stroke Continuity provider, please refer to [email or web address] After hours, contact General Neurology

## 2018-04-15 NOTE — Progress Notes (Signed)
Inpatient Rehabilitation-Admissions Coordinator   Met with pt and pt's daughter [name]) at the bedside. AC had long discussion regarding CIR program (brochures provided), anticipated LOS, and anticipated functional outcomes. Pt's daughter unsure about caregiver support available at DC at this time and is planning on looking into options. Discussed that pt's candidacy for CIR is based on her ability to tolerate 3 hours of therapy as well.   AC plans to follow up with pt and her family to continue discussion regarding DC support plans and continue to follow pt for therapy tolerance.   Please call if questions.   Jhonnie Garner, OTR/L  Rehab Admissions Coordinator  [phone] 04/15/2018 5:23 PM

## 2018-04-15 NOTE — Progress Notes (Signed)
  Speech Language Pathology Treatment: Nada Boozer Speaking valve  Patient Details Name: Amber Rogers MRN: [id number] DOB: 1940/10/24 Today's Date: 04/15/2018 Time: 4503-8882 SLP Time Calculation (min) (ACUTE ONLY): 20 min  Assessment / Plan / Recommendation Clinical Impression  Pt maintains eye closure throughout session with the exception of one, brief moment when she opened them after suctioning. She intermittently follows one-step commands. Audible congestion was noted upon SLP arrival. Pt made attempts to cough given Mod cues (although suspect some coughing was reflexive as well), but she could not expectorate secretion tracheally. PMV was attempted to facilitate strength of cough, but pt could not keep PMV in place for more than 1-2 breath cycles before it would be expelled off or air trapping would be present upon removal. RN provided tracheal suction but without obvious improvement in PMV tolerance. Would continue cuff down as tolerated but PMV with SLP only. Pt may need a smaller trach size to be able to tolerate.   HPI HPI: 77 year old female with PMH of HTN, CKD, CHF, and COPD. She [facility] to [facility] ED 8/22 with complaints of headache, nausea, and vomiting. 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 ETT 8/22-29;  reintubated same day due to stridor; ETT 8/29-9/5; reintubated again due to stridor; tracheostomy 9/6.       SLP Plan  Continue with current plan of care       Recommendations  Diet recommendations: NPO Medication Administration: Via alternative means      Patient may use Passy-Muir Speech Valve: with SLP only PMSV Supervision: Full MD: Please consider changing trach tube to : Smaller size;Cuffless         Oral Care Recommendations: Oral care QID Follow up Recommendations: Inpatient Rehab SLP Visit Diagnosis: Aphonia (R49.1) Plan: Continue with current plan of care       GO                 Amber Rogers 04/15/2018, 1:32 PM  Amber Rogers, M.A. [place] Acute Environmental education officer [phone] Office [phone]

## 2018-04-15 NOTE — Care Management Note (Signed)
Case Management Note  Patient Details  Name: Amber Rogers MRN: [id number] Date of Birth: 09-03-1940  Subjective/Objective:                    Action/Plan: Recommendation is for CIR. CIR following patients progress. CM following for d/c disposition.  Expected Discharge Date:                  Expected Discharge Plan:  IP Rehab Facility  In-House Referral:  Clinical Social Work  Discharge planning Services  CM Consult  Post Acute Care Choice:    Choice offered to:     DME Arranged:    DME Agency:     HH Arranged:    [place] Agency:     Status of Service:  In process, will continue to follow  If discussed at Long Length of Stay Meetings, dates discussed:    Additional Comments:  Pollie Friar, RN 04/15/2018, 1:39 PM

## 2018-04-15 NOTE — Consult Note (Signed)
Physical Medicine and Rehabilitation Consult Reason for Consult: Decreased functional mobility Referring Physician: Dr.Xu   HPI: Amber Rogers is a 77 y.o. right-handed female with history of diastolic congestive heart failure, hypertension, hyperlipidemia, COPD and quit smoking 3 years ago, CAD/MI maintained on aspirin and Brilinta, presented 03/27/2018 with headache, nausea and vomiting.  History tkaen from chart review and family. Cranial CT scan reviewed, showing left cerebellar hematoma. Per report, 2 cm hematoma in the deep left cerebellum with fourth ventricular extension extending into the third lateral ventricles and into the dorsal cervical canal.  There was mild hydrocephalus.  CTA of the head showed no vascular malformation or abnormal enhancement.  No large vessel occlusion or aneurysm.  Patient's Brilinta and aspirin was discontinued due to hematoma.  Required intubation to protect airway.  Echocardiogram with ejection fraction of 65%.  Systolic function was normal.  Patient with worsening mental status as well as hydrocephalus on follow-up CT with neurosurgery Dr. Kathyrn Sheriff consulted with EVD placed 03/27/2018.  Latest repeat CT scans x3 stable volume of intracranial hemorrhage.  Due to extended intubation and multiple attempts for extubation failed patient underwent tracheostomy 04/11/2018 per Dr. Nelda Marseille #6 Shiley cuffed.  Presently n.p.o. with alternative means of nutritional support.  Noted hemoglobin 6.9 on 04/15/2018 with plan for transfusion.  Therapy evaluations completed with recommendations of physical medicine rehab consult.   Review of Systems  Unable to perform ROS: Acuity of condition   Past Medical History:  Diagnosis Date  . Asthma   . CHF (congestive heart failure) ([place])   . Chronic kidney disease (CKD), active medical management without dialysis, stage 3 (moderate) ([place])   . COPD (chronic obstructive pulmonary disease) ([place])   . Coronary artery disease   . High  cholesterol   . Hypertension   . Myocardial infarction ([place]) 04/2015  . Osteoarthritis    "all over" (09/27/2016)  . Pneumonia 1996   Past Surgical History:  Procedure Laterality Date  . CARDIAC CATHETERIZATION N/A 04/14/2015   Procedure: Left Heart Cath and Coronary Angiography;  Surgeon: Jolaine Artist, MD;  Location: [facility];  Service: Cardiovascular;  Laterality: N/A;  . CARDIAC CATHETERIZATION N/A 04/14/2015   Procedure: Coronary Stent Intervention;  Surgeon: Lorretta Harp, MD;  Location: [facility];  Service: Cardiovascular;  Laterality: N/A;  . CORONARY ANGIOPLASTY    . JOINT REPLACEMENT    . KNEE ARTHROSCOPY Right 2004  . TOTAL HIP ARTHROPLASTY Right 02/10/2013   Procedure: TOTAL HIP ARTHROPLASTY ANTERIOR APPROACH;  Surgeon: [name], MD;  Location: [facility];  Service: Orthopedics;  Laterality: Right;   No pertinent family history of premature CVA. Social History:  reports that she quit smoking about 3 years ago. Her smoking use included cigarettes. She has a 20.00 pack-year smoking history. She has never used smokeless tobacco. She reports that she does not drink alcohol or use drugs. Allergies: No Known Allergies Medications Prior to Admission  Medication Sig Dispense Refill  . amLODipine (NORVASC) 10 MG tablet Take 10 mg by mouth daily.    Marland Kitchen aspirin 81 MG chewable tablet Chew 1 tablet (81 mg total) by mouth daily.    Marland Kitchen atorvastatin (LIPITOR) 80 MG tablet Take 1 tablet (80 mg total) by mouth daily at 6 PM. 30 tablet 5  . doxazosin (CARDURA) 2 MG tablet Take 2 mg by mouth daily.    Marland Kitchen lisinopril-hydrochlorothiazide (PRINZIDE,ZESTORETIC) 20-25 MG tablet Take 1 tablet by mouth daily.    Marland Kitchen  nitroGLYCERIN (NITROSTAT) 0.4 MG SL tablet Place 1 tablet (0.4 mg total) under the tongue every 5 (five) minutes x 3 doses as needed for chest pain. 25 tablet 3  . predniSONE (DELTASONE) 5 MG tablet Take 5 mg by mouth daily.  2  . SYMBICORT 160-4.5 MCG/ACT inhaler Inhale 2  puffs into the lungs 2 (two) times daily.    . ticagrelor (BRILINTA) 90 MG TABS tablet Take 1 tablet (90 mg total) by mouth 2 (two) times daily. 60 tablet 11    Home: Home Living Family/patient expects to be discharged to:: Private residence Living Arrangements: Spouse/significant other Available Help at Discharge: Family Type of Home: House Home Access: Stairs to enter Technical brewer of Steps: 3 Entrance Stairs-Rails: None Home Layout: One level Bathroom Shower/Tub: Chiropodist: Standard Additional Comments: hx from daughter present in the room however the daughter was asking the patient "is that right momma? " "Momma do you have stairs? " daughter seemed uncertain at times regarding the answers.   Functional History: Prior Function Level of Independence: Independent Comments: driving, going out to lunch with granddaughter. daughter reports "she drove to my daughters two weeks ago for a birthday party" Functional Status:  Mobility: Bed Mobility Overal bed mobility: Needs Assistance Bed Mobility: Supine to Sit Rolling: Max assist Sidelying to sit: Max assist Supine to sit: +2 for physical assistance, Total assist Sit to supine: Total assist, +2 for physical assistance General bed mobility comments: Pt not alert enough initially to assist Transfers Overall transfer level: Needs assistance Transfers: Sit to/from Stand, Squat Pivot Transfers Sit to Stand: Max assist, +2 physical assistance Stand pivot transfers: Mod assist, +2 physical assistance Squat pivot transfers: Max assist, +2 physical assistance  Lateral/Scoot Transfers: Max assist, +2 safety/equipment General transfer comment: pt needed cues and assist for direction and forward and boost assist for mobility, mostly due to decreased arousal.      ADL: ADL Overall ADL's : Needs assistance/impaired Eating/Feeding: NPO Grooming: Moderate assistance, Wash/dry face, Sitting Grooming Details  (indicate cue type and reason): hand over hand for safety with vent and new trach Upper Body Bathing: Total assistance Lower Body Bathing: Total assistance Upper Body Dressing : Total assistance Lower Body Dressing: Total assistance Toilet Transfer: Moderate assistance, +2 for physical assistance, +2 for safety/equipment, Stand-pivot Toilet Transfer Details (indicate cue type and reason): simulated through recliner transfer [place] and Hygiene: Total assistance General ADL Comments: pt completed sit to stand this session x2 for pad placement  Cognition: Cognition Overall Cognitive Status: Difficult to assess Orientation Level: Oriented to person Cognition Arousal/Alertness: Lethargic Behavior During Therapy: Flat affect Overall Cognitive Status: Difficult to assess General Comments: pt restless and nods "YES" when asked if the vent is the source of discomfort. pt attempting to reach for tubing during session Difficult to assess due to: Tracheostomy, Level of arousal  Blood pressure (!) 162/64, pulse 73, temperature 98.7 F (37.1 C), temperature source Axillary, resp. rate (!) 24, height 5\' 6"  (1.676 m), weight 72.6 kg, SpO2 95 %. Physical Exam  Vitals reviewed. Constitutional: She appears well-developed and well-nourished.  HENT:  Head: Normocephalic and atraumatic.  Nasogastric tube in place  Eyes: Right eye exhibits no discharge. Left eye exhibits no discharge.  Pupils reactive to light  Neck:  Tracheostomy tube in place  Cardiovascular: Normal rate and regular rhythm.  Respiratory:  Limited inspiratory effort  Upper airway sounds  GI: Soft. Bowel sounds are normal. She exhibits no distension.  Musculoskeletal:  No edema  or tenderness in extremities  Neurological:  Patient will keep her eyes closed during exam but would open her eyes to verbal stimuli.   Bilateral mittens are in place for patient safety Motor: Not following commands, wiggles left  toes  Skin: Skin is warm and dry.  Psychiatric:  Unable to assess due to mentation    Results for orders placed or performed during the hospital encounter of 03/27/18 (from the past 24 hour(s))  Glucose, capillary     Status: Abnormal   Collection Time: 04/14/18  8:31 AM  Result Value Ref Range   Glucose-Capillary 106 (H) 70 - 99 mg/dL   Comment 1 Notify RN    Comment 2 Document in Chart   Glucose, capillary     Status: Abnormal   Collection Time: 04/14/18 11:41 AM  Result Value Ref Range   Glucose-Capillary 112 (H) 70 - 99 mg/dL   Comment 1 Notify RN    Comment 2 Document in Chart   Glucose, capillary     Status: Abnormal   Collection Time: 04/14/18  3:31 PM  Result Value Ref Range   Glucose-Capillary 140 (H) 70 - 99 mg/dL  Glucose, capillary     Status: Abnormal   Collection Time: 04/14/18  8:04 PM  Result Value Ref Range   Glucose-Capillary 124 (H) 70 - 99 mg/dL   Comment 1 Notify RN    Comment 2 Document in Chart   Glucose, capillary     Status: Abnormal   Collection Time: 04/14/18 11:08 PM  Result Value Ref Range   Glucose-Capillary 116 (H) 70 - 99 mg/dL  Glucose, capillary     Status: Abnormal   Collection Time: 04/15/18  3:46 AM  Result Value Ref Range   Glucose-Capillary 120 (H) 70 - 99 mg/dL  Glucose, capillary     Status: Abnormal   Collection Time: 04/15/18  4:20 AM  Result Value Ref Range   Glucose-Capillary 119 (H) 70 - 99 mg/dL   Comment 1 Notify RN    Comment 2 Document in Chart   CBC     Status: Abnormal   Collection Time: 04/15/18  4:47 AM  Result Value Ref Range   WBC 15.1 (H) 4.0 - 10.5 K/uL   RBC 2.56 (L) 3.87 - 5.11 MIL/uL   Hemoglobin 6.9 (LL) 12.0 - 15.0 g/dL   HCT 22.9 (L) 36.0 - 46.0 %   MCV 89.5 78.0 - 100.0 fL   MCH 27.0 26.0 - 34.0 pg   MCHC 30.1 30.0 - 36.0 g/dL   RDW 16.0 (H) 11.5 - 15.5 %   Platelets 613 (H) 150 - 400 K/uL  Basic metabolic panel     Status: Abnormal   Collection Time: 04/15/18  4:47 AM  Result Value Ref Range    Sodium 142 135 - 145 mmol/L   Potassium 3.4 (L) 3.5 - 5.1 mmol/L   Chloride 111 98 - 111 mmol/L   CO2 24 22 - 32 mmol/L   Glucose, Bld 120 (H) 70 - 99 mg/dL   BUN 24 (H) 8 - 23 mg/dL   Creatinine, Ser 0.85 0.44 - 1.00 mg/dL   Calcium 7.9 (L) 8.9 - 10.3 mg/dL   GFR calc non Af Amer >60 >60 mL/min   GFR calc Af Amer >60 >60 mL/min   Anion gap 7 5 - 15   Dg Chest Port 1 View  Result Date: 04/13/2018 CLINICAL DATA:  Fever EXAM: PORTABLE CHEST 1 VIEW COMPARISON:  April 11, 2018 FINDINGS: The feeding  tube terminates below today's film. Stable tracheostomy tube. Stable right PICC line in good position. Stable cardiomegaly. Small effusion and atelectasis in the left base. No other interval changes. IMPRESSION: Support apparatus as above. Tiny left effusion with associated atelectasis. No other change. Electronically Signed   By: Dorise Bullion III M.D   On: 04/13/2018 12:20    Assessment/Plan: Diagnosis: left cerebellar hematoma Labs and images (see above) independently reviewed.  Records reviewed and summated above. Stroke: Continue secondary stroke prophylaxis and Risk Factor Modification listed below:   Blood Pressure Management:  Continue current medication with prn's with permisive HTN per primary team ?Hemiparesis: fit for orthosis to prevent contractures (resting hand splint for day, wrist cock up splint at night, PRAFO, etc) Motor recovery: Fluoxetine  1. Does the need for close, 24 hr/day medical supervision in concert with the patient's rehab needs make it unreasonable for this patient to be served in a less intensive setting? Yes  2. Co-Morbidities requiring supervision/potential complications: diastolic congestive heart failure (monitor for signs/symptoms of fluid overload), HTN (monitor and provide prns in accordance with increased physical exertion and pain), hyperlipidemia, COPD (monitor RR and O2 sats with increased exertion), CAD/MI (cont meds), post-stroke dysphagia (advance  diet as tolerated),  Steroid induced hyperglycemia (Monitor in accordance with exercise and adjust meds as necessary), leukocytosis (cont to monitor for signs and symptoms of infection, further workup if indicated), ABLA (transfuse if necessary to ensure appropriate perfusion for increased activity tolerance), hypokalemia (continue to monitor and replete as necessary) 3. Due to bladder management, bowel management, safety, skin/wound care, disease management, medication administration and patient education, does the patient require 24 hr/day rehab nursing? Yes 4. Does the patient require coordinated care of a physician, rehab nurse, PT (1-2 hrs/day, 5 days/week), OT (1-2 hrs/day, 5 days/week) and SLP (1-2 hrs/day, 5 days/week) to address physical and functional deficits in the context of the above medical diagnosis(es)? Yes Addressing deficits in the following areas: balance, endurance, locomotion, strength, transferring, bowel/bladder control, bathing, dressing, feeding, grooming, toileting, cognition, speech, language, swallowing and psychosocial support 5. Can the patient actively participate in an intensive therapy program of at least 3 hrs of therapy per day at least 5 days per week? Potentially 6. The potential for patient to make measurable gains while on inpatient rehab is excellent 7. Anticipated functional outcomes upon discharge from inpatient rehab are min assist and mod assist  with PT, min assist and mod assist with OT, min assist and mod assist with SLP. 8. Estimated rehab length of stay to reach the above functional goals is: 22-27 days. 9. Anticipated D/C setting: TBD 10. Anticipated post D/C treatments: HH therapy and Home excercise program 11. Overall Rehab/Functional Prognosis: good  RECOMMENDATIONS: This patient's condition is appropriate for continued rehabilitative care in the following setting: CIR when medically stable and able to tolerate 3 hours of therapy/day if caregiver  support available upon discharge. Patient has agreed to participate in recommended program. Potentially Note that insurance prior authorization may be required for reimbursement for recommended care.  Comment: Rehab Admissions Coordinator to follow up.   I have personally performed a face to face diagnostic evaluation, including, but not limited to relevant history and physical exam findings, of this patient and developed relevant assessment and plan.  Additionally, I have reviewed and concur with the physician assistant's documentation above.   Delice Lesch, MD, ABPMR Lavon Paganini Angiulli, PA-C 04/15/2018

## 2018-04-16 ENCOUNTER — Inpatient Hospital Stay (HOSPITAL_COMMUNITY): Payer: Medicare Other

## 2018-04-16 DIAGNOSIS — J15211 Pneumonia due to Methicillin susceptible Staphylococcus aureus: Secondary | ICD-10-CM

## 2018-04-16 LAB — TYPE AND SCREEN
ABO/RH(D): O POS
ANTIBODY SCREEN: NEGATIVE
UNIT DIVISION: 0
Unit division: 0

## 2018-04-16 LAB — BASIC METABOLIC PANEL
Anion gap: 9 (ref 5–15)
BUN: 22 mg/dL (ref 8–23)
CALCIUM: 8.3 mg/dL — AB (ref 8.9–10.3)
CO2: 25 mmol/L (ref 22–32)
Chloride: 107 mmol/L (ref 98–111)
Creatinine, Ser: 0.8 mg/dL (ref 0.44–1.00)
GFR calc Af Amer: >60 mL/min (ref >60)
GLUCOSE: 125 mg/dL — AB (ref 70–99)
Potassium: 3.4 mmol/L — ABNORMAL LOW (ref 3.5–5.1)
Sodium: 141 mmol/L (ref 135–145)

## 2018-04-16 LAB — CBC
HEMATOCRIT: 32 % — AB (ref 36.0–46.0)
Hemoglobin: 10 g/dL — ABNORMAL LOW (ref 12.0–15.0)
MCH: 27.5 pg (ref 26.0–34.0)
MCHC: 31.3 g/dL (ref 30.0–36.0)
MCV: 87.9 fL (ref 78.0–100.0)
PLATELETS: 558 10*3/uL — AB (ref 150–400)
RBC: 3.64 MIL/uL — ABNORMAL LOW (ref 3.87–5.11)
RDW: 15.4 % (ref 11.5–15.5)
WBC: 16.8 10*3/uL — AB (ref 4.0–10.5)

## 2018-04-16 LAB — BPAM RBC
Blood Product Expiration Date: 201909142359
Blood Product Expiration Date: 201910052359
ISSUE DATE / TIME: 201909101102
ISSUE DATE / TIME: 201909101613
UNIT TYPE AND RH: 5100
Unit Type and Rh: 9500

## 2018-04-16 LAB — GLUCOSE, CAPILLARY
GLUCOSE-CAPILLARY: 231 mg/dL — AB (ref 70–99)
Glucose-Capillary: 177 mg/dL — ABNORMAL HIGH (ref 70–99)
Glucose-Capillary: 213 mg/dL — ABNORMAL HIGH (ref 70–99)
Glucose-Capillary: 129 mg/dL — ABNORMAL HIGH (ref 70–99)
Glucose-Capillary: 122 mg/dL — ABNORMAL HIGH (ref 70–99)

## 2018-04-16 LAB — HAPTOGLOBIN: HAPTOGLOBIN: 413 mg/dL — AB (ref 34–200)

## 2018-04-16 MED ORDER — AMANTADINE HCL 50 MG/5ML PO SYRP
100.0000 mg | ORAL_SOLUTION | Freq: Two times a day (BID) | ORAL | Status: DC
  Administered 2018-04-16 – 2018-04-22 (×12): 100 mg via ORAL
  Filled 2018-04-16 (×15): qty 10

## 2018-04-16 MED ORDER — IPRATROPIUM-ALBUTEROL 0.5-2.5 (3) MG/3ML IN SOLN
3.0000 mL | Freq: Four times a day (QID) | RESPIRATORY_TRACT | Status: DC | PRN
  Administered 2018-04-16 – 2018-04-21 (×2): 3 mL via RESPIRATORY_TRACT
  Filled 2018-04-16 (×2): qty 3

## 2018-04-16 MED ORDER — MODAFINIL 100 MG PO TABS
200.0000 mg | ORAL_TABLET | Freq: Every day | ORAL | Status: DC
  Administered 2018-04-17 – 2018-04-18 (×2): 200 mg via ORAL
  Filled 2018-04-16 (×2): qty 2

## 2018-04-16 NOTE — Progress Notes (Signed)
Pt has been more alert this evening.  Daughter at bedside.  Will follow with her eyes, smile and mouth words, appropriately.  Reddened area near trach on neck.  No skin breakdown, but pt will grimace when cleaning around area. Previous RN states notified MD. Trach collar secure and dry.  Will continue to monitor.

## 2018-04-16 NOTE — Progress Notes (Signed)
PT Cancellation Note  Patient Details Name: Amber Rogers MRN: [id number] DOB: Jul 19, 1941   Cancelled Treatment:    Reason Eval/Treat Not Completed: Medical issues which prohibited therapy. Pt vomiting and with increased dizziness today. BP noted to be rising. Neurology NP called in to assess pt. Family and RN present. PT will continue to follow acutely.    Salina April, PTA Acute Rehabilitation Services Pager: [phone]   04/16/2018, 11:57 AM

## 2018-04-16 NOTE — Progress Notes (Signed)
Physical Therapy Treatment Patient Details Name: Amber Rogers MRN: [id number] DOB: 16-Apr-1941 Today's Date: 04/16/2018    History of Present Illness 77 year old female with PMH of HTN, CKD, CHF (EF 55% and grade 2 DD in 2016), and COPD. She presented to [facility] ED 8/22 with complaints of headache, nausea, and vomiting with onset of symptoms around 1200. She was found to be hypertensive. CT scan demonstrated hematoma in the left cerebellum with early hydrocephalus. Initially no neurosurgical intervention was indicated due to her reasonable mental status and dual antiplatelet regimen, however, her condition continued to decline and she required intubation. She was then deemed a candidate for EVD placement.    PT Comments    Patient seen for mobility progression. Pt requires max A/total A +2 for bed mobility and min-max A for sitting balance EOB. Pt tolerated sitting EOB for 15 minutes and participated in strengthening and balance exercises until fatigued. Pt returned to supine with all needs met end of session. VSS throughout session. Continue to progress as tolerated.   Follow Up Recommendations  CIR     Equipment Recommendations  Other (comment)    Recommendations for Other Services Rehab consult     Precautions / Restrictions Precautions Precautions: Fall Precaution Comments: trach collar Restrictions Weight Bearing Restrictions: No    Mobility  Bed Mobility Overal bed mobility: Needs Assistance Bed Mobility: Supine to Sit;Sit to Supine     Supine to sit: Total assist;+2 for physical assistance Sit to supine: Max assist;+2 for physical assistance   General bed mobility comments: Pt able to assist with return to bed but total assist +2 to achieve upright positioning at EOB.   Transfers                 General transfer comment: deferred for patient/therapist safety  Ambulation/Gait                 Stairs             Wheelchair Mobility     Modified Rankin (Stroke Patients Only)       Balance Overall balance assessment: Needs assistance Sitting-balance support: Single extremity supported;Bilateral upper extremity supported Sitting balance-Leahy Scale: Poor Sitting balance - Comments: worked on sitting balance EOB; pt participating in reaching exercises outside BOS and able to correct posture with verbal/tactile; pt with R lateral and posterior lean; Fluctuating assist from mod-max assist; able to achieve min assist briefly at times.                                     Cognition Arousal/Alertness: Lethargic Behavior During Therapy: Flat affect Overall Cognitive Status: Difficult to assess                                 General Comments: Pt at times nodding and attempting to answer questions.       Exercises      General Comments General comments (skin integrity, edema, etc.): VSS      Pertinent Vitals/Pain Pain Assessment: Faces Faces Pain Scale: Hurts little more Pain Location: grimacing due to trach collar with skin irritation Pain Descriptors / Indicators: Grimacing Pain Intervention(s): Limited activity within patient's tolerance;Monitored during session;Repositioned    Home Living  Prior Function            PT Goals (current goals can now be found in the care plan section) Acute Rehab PT Goals Patient Stated Goal: unable to state Progress towards PT goals: Progressing toward goals    Frequency    Min 3X/week      PT Plan Current plan remains appropriate    Co-evaluation PT/OT/SLP Co-Evaluation/Treatment: Yes Reason for Co-Treatment: Complexity of the patient's impairments (multi-system involvement);Necessary to address cognition/behavior during functional activity;To address functional/ADL transfers;For patient/therapist safety PT goals addressed during session: Mobility/safety with mobility;Balance;Strengthening/ROM OT goals  addressed during session: ADL's and self-care;Strengthening/ROM      AM-PAC PT "6 Clicks" Daily Activity  Outcome Measure  Difficulty turning over in bed (including adjusting bedclothes, sheets and blankets)?: Unable Difficulty moving from lying on back to sitting on the side of the bed? : Unable Difficulty sitting down on and standing up from a chair with arms (e.g., wheelchair, bedside commode, etc,.)?: Unable Help needed moving to and from a bed to chair (including a wheelchair)?: A Lot Help needed walking in hospital room?: Total Help needed climbing 3-5 steps with a railing? : Total 6 Click Score: 7    End of Session Equipment Utilized During Treatment: Oxygen Activity Tolerance: Patient tolerated treatment well Patient left: in bed;with call bell/phone within reach;with bed alarm set;with SCD's reapplied;with restraints reapplied(bilat mittens) Nurse Communication: Mobility status PT Visit Diagnosis: Other abnormalities of gait and mobility (R26.89);Other symptoms and signs involving the nervous system (R29.898)     Time: 8185-6314 PT Time Calculation (min) (ACUTE ONLY): 31 min  Charges:  $Therapeutic Activity: 8-22 mins                     Earney Navy, PTA Acute Rehabilitation Services Pager: [phone] Office: [phone]     Darliss Cheney 04/16/2018, 5:19 PM

## 2018-04-16 NOTE — Progress Notes (Signed)
Occupational Therapy Treatment Patient Details Name: Amber Rogers MRN: [id number] DOB: 1941/01/29 Today's Date: 04/16/2018    History of present illness 77 year old female with PMH of HTN, CKD, CHF (EF 55% and grade 2 DD in 2016), and COPD. She presented to [facility] ED 8/22 with complaints of headache, nausea, and vomiting with onset of symptoms around 1200. She was found to be hypertensive. CT scan demonstrated hematoma in the left cerebellum with early hydrocephalus. Initially no neurosurgical intervention was indicated due to her reasonable mental status and dual antiplatelet regimen, however, her condition continued to decline and she required intubation. She was then deemed a candidate for EVD placement.   OT comments  Pt able to sit at EOB with assistance for 15 minutes this session with good tolerance to participate in preparatory activities and core strengthening tasks. Pt requiring total assist +2 to achieve positioning at EOB but was able to sustain balance with mod-max assist and brief moments of min assist. She was able to attempt to follow commands this session reaching toward OT to give "high five" with poor ability to reach to target. D/C recommendation remains appropriate. Will check back as able.     Follow Up Recommendations  SNF    Equipment Recommendations  3 in 1 bedside commode;Wheelchair (measurements OT);Wheelchair cushion (measurements OT);Hospital bed    Recommendations for Other Services      Precautions / Restrictions Precautions Precautions: Fall Precaution Comments: trach collar Restrictions Weight Bearing Restrictions: No       Mobility Bed Mobility Overal bed mobility: Needs Assistance Bed Mobility: Supine to Sit;Sit to Supine     Supine to sit: Total assist;+2 for physical assistance Sit to supine: Max assist;+2 for physical assistance   General bed mobility comments: Pt able to assist with return to bed but total assist +2 to achieve upright  positioning at EOB.   Transfers                 General transfer comment: unsafe to attempt    Balance Overall balance assessment: Needs assistance Sitting-balance support: Single extremity supported;Bilateral upper extremity supported Sitting balance-Leahy Scale: Poor Sitting balance - Comments: Fluctuating assist from mod-max assist; able to achieve min assist briefly at times.                                    ADL either performed or assessed with clinical judgement   ADL Overall ADL's : Needs assistance/impaired                                       General ADL Comments: Able to sit at EOB for 15 minutes for preparatory activities and core strengthening tasks.      Vision   Additional Comments: minimally opening her eyes throughout session; able to open eyes and attempt to make eye contact; attempting to scan although difficult to scan to the R today    Perception     Praxis      Cognition Arousal/Alertness: Lethargic Behavior During Therapy: Flat affect Overall Cognitive Status: Difficult to assess                                 General Comments: Pt at times nodding and attempting to answer questions.  Exercises     Shoulder Instructions       General Comments VSS    Pertinent Vitals/ Pain       Pain Assessment: Faces Faces Pain Scale: Hurts little more Pain Location: grimacing due to trach collar with skin irritation Pain Descriptors / Indicators: Grimacing Pain Intervention(s): Limited activity within patient's tolerance;Monitored during session;Repositioned  Home Living                                          Prior Functioning/Environment              Frequency  Min 2X/week        Progress Toward Goals  OT Goals(current goals can now be found in the care plan section)  Progress towards OT goals: Progressing toward goals  Acute Rehab OT Goals Patient  Stated Goal: unable to state OT Goal Formulation: Patient unable to participate in goal setting Time For Goal Achievement: 04/23/18 Potential to Achieve Goals: Good  Plan Discharge plan remains appropriate;Frequency remains appropriate    Co-evaluation    PT/OT/SLP Co-Evaluation/Treatment: Yes Reason for Co-Treatment: Complexity of the patient's impairments (multi-system involvement);Necessary to address cognition/behavior during functional activity;For patient/therapist safety;To address functional/ADL transfers   OT goals addressed during session: ADL's and self-care;Strengthening/ROM      AM-PAC PT "6 Clicks" Daily Activity     Outcome Measure   Help from another person eating meals?: Total Help from another person taking care of personal grooming?: Total Help from another person toileting, which includes using toliet, bedpan, or urinal?: Total Help from another person bathing (including washing, rinsing, drying)?: Total Help from another person to put on and taking off regular upper body clothing?: Total Help from another person to put on and taking off regular lower body clothing?: Total 6 Click Score: 6    End of Session Equipment Utilized During Treatment: Oxygen  OT Visit Diagnosis: Unsteadiness on feet (R26.81);Muscle weakness (generalized) (M62.81);Pain;Cognitive communication deficit (R41.841)   Activity Tolerance Patient tolerated treatment well   Patient Left with call bell/phone within reach;with family/visitor present;with restraints reapplied;in bed   Nurse Communication Mobility status;Need for lift equipment;Precautions        Time: 8127-5170 OT Time Calculation (min): 31 min  Charges: OT General Charges $OT Visit: 1 Visit OT Treatments $Self Care/Home Management : 8-22 mins  Belva Agee, OTR/L [place] Pager [phone] Office [phone]    Amber Rogers 04/16/2018, 4:39 PM

## 2018-04-16 NOTE — Progress Notes (Signed)
OT Cancellation Note  Patient Details Name: MERLE WHITEHORN MRN: [id number] DOB: [birth year]   Cancelled Treatment:    Reason Eval/Treat Not Completed: Medical issues which prohibited therapy. Pt vomiting and with increased dizziness today. BP noted to be rising. Neurology NP called to assess. Will check back as appropriate.   [place], OTR/L [place] Pager [phone] Office [phone]   Norman Herrlich 04/16/2018, 11:38 AM

## 2018-04-16 NOTE — Progress Notes (Signed)
PULMONARY / CRITICAL CARE MEDICINE   NAME:  Amber Rogers, MRN:  [id number], DOB:  02-22-1941, LOS: 42 ADMISSION DATE:  03/27/2018, CONSULTATION DATE:  03/27/2018 REFERRING MD:  Dr. Leonel Ramsay, Neurology, CHIEF COMPLAINT:  Headache  HISTORY OF PRESENT ILLNESS:   77 yo female former smoker presented with HA, N/V and noted to be hypertensive.  CT head showed hematoma in Lt cerebellum with hydrocephalus.  Required intubation for airway protection.  She had EVD by neurosurgery.  Failed extubation attempts on 8/28 and 9/05 due to stridor.  Had tracheostomy done on 9/06.  PAST MEDICAL HISTORY: CAD, CKD 3, Diastolic CHF, HLD  SIGNIFICANT EVENTS: 8/22 Admit, EVD placed 8/26 Fever, add ABx 8/28 replaced EVD (clogged) 8/29 failed extubation (stridor) 9/05 failed extubation (stridor) 9/06 trach 9/08 fever 9/10 transfuse PRBC, increased respiratory secretions >> start ABx 9/11 vomited feels more short of breath  STUDIES:  CT head 8/22 >> 2 cm hematoma Lt cerebellum with extension to 4th ventricle, mild hydrocephalus Echo 8/25 >> mild LVH, EF 60 to 65%, mild AS  CULTURES: Sputum 8/26 >> oral flora Blood 8/26 >> negative CSF 8/29 >> negative Sputum 9/03 >> Candida Blood 9/08 >>  Sputum 9/08 >> Few MSSA  ANTIBIOTICS: Cefepime 8/26 >> 9/01 Vancomycin 8/26 >> 9/01 Ancef 9/10 >>   LINES/TUBES: Trach 9/06 >>   CONSULTANTS: Neurosurgery 8/22 hydrocephalus >> CIR 9/10 >>   SUBJECTIVE: Short of breath CONSTITUTIONAL: Blood Pressure (Abnormal) 187/72   Pulse 74   Temperature 98.9 F (37.2 C) (Axillary)   Respiration (Abnormal) 24   Height 5\' 6"  (1.676 m)   Weight 74.4 kg   Oxygen Saturation 97%   Body Mass Index 26.47 kg/m   I/O last 3 completed shifts: In: 1671.9 [I.V.:36.9; Blood:1155; NG/GT:480] Out: 2925 [Urine:2925]  FiO2 (%):  [35 %] 35 %  PHYSICAL EXAM:  General: Is a 77 year old female patient status post MCA stroke.  She is currently in bed, with mild increase in  respiratory efforts today compared to prior exams. HEENT normocephalic atraumatic.  She has a #6 cuffed tracheostomy in place with copious yellow-brown secretions.  There appears to be early pressure ulceration on the bottom of the tracheostomy flange Pulmonary: Scattered rhonchi, no accessory use. Cardiac: Irregular irregular atrial fibrillation on telemetry Extremities: Warm and dry Abdomen: Soft nontender positive bowel sounds Neuro: Awake, interactive, left-sided weakness.  ASSESSMENT/PLAN:  Compromised airway in setting of ICH. Post extubation stridor. S/p tracheostomy. Plan Cont routine trach care Removed bottom sutures will remove others on 14th CXR now following vomiting episode Would benefit from changing to cuffless at time of suture removal (this may help her w/ PMV toleration). May need to use another trach to get flange pressure off skin  COPD. 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 Plan Cont rehab efforts  Dysphagia. Plan Cont to work w/ SLP Cont tubefeeds  Acute MSSA tracheobronchitis versus pneumonia PCXR personally reviewed small amt left base volume loss.  Does have some right lower lobe airspace disease I worry about possible intermittent aspiration  plan Day 2/7 ancef CXR 9/13    Hypertension. Plan SBP goal <160 Cont norvasc & hydralazine  CKD 2. Plan Trend renal fxn   Anemia. Normocytic/normochromic  -transfused 9/10 -no evidence of bleeding Plan Trend cbc   SUMMARY: MSSA  tracheobronchitis.  Antibiotics restarted.  Had an episode of vomiting this morning, worried about subsequent aspiration event.  She reports she is more short of breath than when examined previously.  Looking at KUB Of abdomen could be evolving ileus, we  will see again on 9/12.  DVT PROPHYLAXIS: SCDs SUP: Protonix NUTRITION: Tube feeds MOBILITY: f/u with PT GOALS OF CARE: Full code FAMILY DISCUSSIONS: Updated family at bedside  LABS:  Glucose Recent Labs  Lab 04/15/18 0754 04/15/18 1152 04/15/18 1637 04/15/18 1946 04/16/18 0733 04/16/18 1106  GLUCAP 104* 117* 137* 118* 177* 213*    BMET Recent Labs  Lab 04/14/18 0553 04/15/18 0447 04/16/18 0455  NA 142 142 141  K 3.7 3.4* 3.4*  CL 111 111 107  CO2 25 24 25   BUN 32* 24* 22  CREATININE 0.92 0.85 0.80  GLUCOSE 113* 120* 125*    Liver Enzymes No results for input(s): AST, ALT, ALKPHOS, BILITOT, ALBUMIN in the last 168 hours.  Electrolytes Recent Labs  Lab 04/11/18 0613  04/14/18 0553 04/15/18 0447 04/16/18 0455  CALCIUM 8.4*   < > 8.1* 7.9* 8.3*  MG 2.2  --   --   --   --   PHOS 3.9  --   --   --   --    < > = values in this interval not displayed.    CBC Recent Labs  Lab 04/14/18 0553 04/15/18 0447 04/15/18 1638 04/16/18 0455  WBC 18.1* 15.1*  --  16.8*  HGB 7.1* 6.9* 8.1* 10.0*  HCT 23.9* 22.9* 26.9* 32.0*  PLT 533* 613*  --  558*    ABG No results for input(s): PHART, PCO2ART, PO2ART in the last 168 hours.  Coag's Recent Labs  Lab 04/11/18 0613  INR 1.06    Sepsis Markers No results for input(s): LATICACIDVEN, PROCALCITON, O2SATVEN in the last 168 hours.  Cardiac Enzymes No results for input(s): TROPONINI, PROBNP in the last 168 hours.  Imaging Dg Chest Port 1 View  Result Date: 04/16/2018 CLINICAL DATA:  Shortness of breath EXAM: PORTABLE CHEST 1 VIEW COMPARISON:  April 13, 2018 FINDINGS: Tracheostomy catheter tip is 4.6 cm above the carina. Feeding tube tip is below the diaphragm. Central catheter tip is in the superior vena cava. No pneumothorax. There is patchy airspace opacity in the right lower lobe. There is a small left pleural effusion with medial left base atelectasis. Heart is upper normal in size with pulmonary  vascularity normal. No adenopathy. No bone lesions. IMPRESSION: Tube and catheter positions as described without pneumothorax. Patchy airspace opacity, likely pneumonia, right base. Atelectasis medial left base with small left pleural effusion. Stable cardiac prominence. Electronically Signed   By: Lowella Grip III M.D.   On: 04/16/2018 10:03    Erick Colace ACNP-BC [place] Pager # [phone] OR # [phone] if no answer

## 2018-04-16 NOTE — Progress Notes (Signed)
Patient has refused chest PT for the night.  Will pass on to day shift to start chest PT every 8 hours per MD order.

## 2018-04-16 NOTE — Progress Notes (Addendum)
STROKE TEAM PROGRESS NOTE   INTERVAL HISTORY Patient's dtr is at bedside. Pt is lying in bed. Hgb up to 10 post transfusion. Remains lethartic, not opening eyes or following commands. on ancef. CXR now shows PNA. Discussed long-term prognosis and ability with dtr. She feels pt would not want PEG. She and her sister are wondering what their future care options are if she does not improve. They are interested in palliative options. Will ask team to get involved. Spit up clear liquids this am, no tube feedings.   Vitals:   04/16/18 0950 04/16/18 1055 04/16/18 1148 04/16/18 1450  BP: (!) 167/62  (!) 187/72   Pulse:   74   Resp:   (!) 24   Temp:  98.9 F (37.2 C)    TempSrc:  Axillary    SpO2:   97% 97%  Weight:      Height:        CBC:  Recent Labs  Lab 04/15/18 0447 04/15/18 1638 04/16/18 0455  WBC 15.1*  --  16.8*  HGB 6.9* 8.1* 10.0*  HCT 22.9* 26.9* 32.0*  MCV 89.5  --  87.9  PLT 613*  --  558*    Basic Metabolic Panel:  Recent Labs  Lab 04/11/18 0613  04/15/18 0447 04/16/18 0455  NA 139   < > 142 141  K 4.3   < > 3.4* 3.4*  CL 105   < > 111 107  CO2 23   < > 24 25  GLUCOSE 120*   < > 120* 125*  BUN 30*   < > 24* 22  CREATININE 0.86   < > 0.85 0.80  CALCIUM 8.4*   < > 7.9* 8.3*  MG 2.2  --   --   --   PHOS 3.9  --   --   --    < > = values in this interval not displayed.   Lipid Panel:     Component Value Date/Time   CHOL 177 03/30/2018 0235   TRIG 67 03/30/2018 2248   HDL 101 03/30/2018 0235   CHOLHDL 1.8 03/30/2018 0235   VLDL 10 03/30/2018 0235   LDLCALC 66 03/30/2018 0235   HgbA1c:  Lab Results  Component Value Date   HGBA1C 5.5 03/30/2018   Urine Drug Screen:     Component Value Date/Time   LABOPIA NONE DETECTED 03/29/2018 1110   COCAINSCRNUR NONE DETECTED 03/29/2018 1110   LABBENZ NONE DETECTED 03/29/2018 1110   AMPHETMU NONE DETECTED 03/29/2018 1110   THCU NONE DETECTED 03/29/2018 1110   LABBARB NONE DETECTED 03/29/2018 1110    Alcohol  Level     Component Value Date/Time   ETH <10 03/27/2018 1354    IMAGING Ct Head Wo Contrast 03/27/2018 IMPRESSION:  2 cm hematoma in the deep left cerebellum with fourth ventricular extension extending into the third/lateral ventricles and into the dorsal cervical canal. There is mild hydrocephalus at the lateral ventricles when compared to 2016. A deep cerebellar origin favors hypertensive etiology.   Ct Angio Head W Or Wo Contrast 03/27/2018 IMPRESSION:  1. No vascular malformation or abnormal enhancement of the brain identified.  2. Stable cerebellar and intraventricular hemorrhage.  3. Patent anterior and posterior intracranial circulation. No large vessel occlusion, aneurysm, or significant stenosis.  4. Calcific atherosclerosis of the carotid and vertebral arteries with mild stenosis.   Ct Head Wo Contrast 03/28/2018 1. Stable volume of intracranial hemorrhage within the left cerebellar hemisphere and the ventricular system. Some redistribution of  blood products to the lateral ventricle atria and sylvian fissures.  2. No new acute intracranial abnormality.  3. Interval right frontal approach ventriculostomy catheter placement. Mild decrease in hydrocephalus.  03/30/2018 Stable volume of intraventricular, subarachnoid, and left cerebellar hemorrhage. Stable ventricle size. No new acute intracranial abnormality.  04/01/2018 1. Partial interval dispersion of intraventricular, subarachnoid, and left cerebellar brain parenchymal hemorrhage. 2. Resolution of hydrocephalus. Stable position of right frontal approach ventriculostomy catheter. 3. No new acute intracranial process.  04/02/2018 1. Recurrent lateral and third ventricular hydrocephalus. Newly seen clot along the EVD, presumed cause of catheter dysfunction. 2. No interval bleeding. 3. Increased low-density along the EVD, likely tracking CSF. 4. No herniation or infarct.  04/10/2018 1. Continued interval evolution of left cerebellar  hematoma with slightly improved localized vasogenic edema, with persistent but slightly improved mass effect on the adjacent fourth ventricle. 2. Intraventricular extension with similar small volume intraventricular blood products.  Stable mild hydrocephalus with right frontal ventriculostomy in place. 3. No other new acute intracranial abnormality.  04/12/2018 1. Interval EVD removal with unchanged mild ventriculomegaly. 2. Unchanged left cerebellar hematoma and surrounding vasogenic edema. 3. Small volume intraventricular hemorrhage without significant change. 4. No evidence of new intracranial abnormality.   CUS 1-39% ICA stenosis.  Vertebral artery flow is antegrade. Significant calcific plaque bilateral bifurcations, however, velocities are not significantly increased.   TTE - Left ventricle: The cavity size was normal. Wall thickness was increased in a pattern of mild LVH. Systolic function was normal. The estimated ejection fraction was in the range of 60% to 65%. Doppler parameters are consistent with both elevated ventricular end-diastolic filling pressure and elevated left atrial filling pressure. - Aortic valve: There was mild stenosis. Valve area (VTI): 1.88 cm^2. Valve area (Vmax): 1.82 cm^2. Valve area (Vmean): 1.59 cm^2. - Mitral valve: Calcified annulus. - Atrial septum: No defect or patent foramen ovale was identified.   PHYSICAL EXAM General - Well nourished, well developed, on trach collar. Cardiovascular - Regular rate and rhythm. Skin - R FA edema (likely from dependency), R arm IV site ok without upper arm edema. Skin above trach reddened and sore to touch. No skin breakdown. Trach collar not tight.  Neuro - on trach collar, eyes closed, does not open to stimulation, fights to keep closed when examiner opens. No attempted verbal interaction today. No speech/sounds. She did not follow commands. Pupils are equal and reactive to light. Eyes left gazed preference. Blinking to  visual threat on the left but not on the right. Corneal reflexes are brinsk. Seems to have right facial droop. Motor system exam she is able to withdraw all 4 extremities with painful stimuli, arms>legs. MAEx4. Able to against gravity BUE 3+/5 and BLE 3/5 on pain. Increased muscle tone BUEs. Deep tendon reflexes are 1+ symmetric, but babinski positive bilaterally. Sensation, coordination and gait not tested.  Later in day  - awake, eyes open. Will appropriately mouth words - name, place. No Complaints. Follows simple commands. Counts fingers. MAE x 4.   ASSESSMENT/PLAN Ms. KAYDENCE MENARD is a 77 y.o. female with history of COPD, CHF, HTN, HLD, CKD III, CAD s/p stent presenting with sudden onset nausea, vomiting and dizziness.   ICH:  left cerebellar ICH w/ IVH and mild hydrocephalus, hemorrhage likely secondary to hypertension  Worsening mental status and hydrocephalus on CT, NS consulted (Nundkumar) with EVD placed 03/27/2018  CT head 2 cm left cerebellar hemorrhage with fourth ventricular extension, extending into the third/lateral ventricles and into the dorsal  cervical canal.  Mild hydrocephalus lateral ventricles.    CTA head no vascular malformation or abnormal enhancement.  Stable cerebellar and IVH.  No LVO.  Intracranial atherosclerosis.  Repeat CT x 3 - Stable volume of intracranial hemorrhage and hydrocephalus  CUS - Significant calcific plaque bilateral bifurcations, however, velocities are not significantly increased.  2D Echo EF 60-65%  UDS neg  LDL 66  HgbA1c 5.5  Heparin subq for VTE prophylaxis  aspirin 81 mg daily and Brilinta 90 mg twice daily prior to admission, now on no antithrombotics d/t hmg  Therapy recommendations:  CIR  Disposition:  pending   Palliative consult for long-term care options. Family do not want long-term PEG.  Obstructive hydrocephalus s/p R frontal EVD   EVD placed 8/22, replaced 8/28  CT showed recurrent hydro prior to EVD  replacement  Repeat CT stable after EVD replacement  Repeat CT stable 9/5 after 48h of EVD clamping  EVD removed on 04/10/18  NSG signed off  CT repeat 04/12/18 stable mild hydrocephalus  Pneumonia Tracheobronchitis Leukocytosis  Temp 101.2 -> normal  UA neg for UTI,   Blood culture NNGTD  Leukocytosis  19.1->18.1->15.1->16.8  On home prednisone for COPD  Sputum Cx +MSSA  Started on ancef 9/10 by CCM  D2/7  Repeat CXR left effusion and patchy R base airspace dz felt to be PNA  Carotid stenosis   CUS 08/2017 R ICA stenosis 50-69%, L ICA < 50%  CUS repeat showed b/l ICA 1-39% stenosis but significant calcified plaque at carotid bifurcation bilaterally.   Acute Hypoxic Respiratory failure - extubation and re-intubation S/p Trach placement  Intubated in the ED with neuro decline   CCM on board  Extubated and re-intubated 04/03/18  Trach done 04/11/18  Currently on trach collar, tolerating well  CCM recommends cuffess trach at time of suture removal   Dysphagia secondary to stroke  NPO on tube feeds.   Family does not think she/they would want PEG  Hypertensive Emergency  BP stable off cardene  Home Meds: Norvasc 10, resumed  Currently on norvasc 10 and hydralazine 50 Q8  SBP goal < 160  AKI on CKD  Cre 0.92  On IVF to 50cc  D/c free water  Na 142  Continue monitoring  Hyperlipidemia  Home meds:  ?? Lipitor 80   LDL 66  Hold statin for now. Recheck in 6-8 weeks. If remains low, no statin needed. If elevated, resume statin.   Other Stroke Risk Factors  Advanced age  Former Cigarette smoker, quit 2 years ago  Coronary artery disease s/p stent - on ASA and brilinta PTA  Chronic Congestive heart failure  Anemia.   Hgb 6.9. Essentially stable this admission 7.8->6.9.   Last documented 11.0 Feb 2018  2u PRBCs.  Hgb now 10.0 post transfurtion  check stool guiac pending   Check Fer 94, VB12 403, lactate 166, haptoglobin 413,  Folate 14.8, FE studies 94   Other Active Problems  Hx vertigo   COPD - on home prednisone per tube  Hypokalemia 3.4  ?Vomiting vs spit up - KUB with increased bowel pattern. CCM following  Today increased dizziness when working with PT. Spit up clear liquids, not tube feedings. No neuro change.   Hospital day # [place], MSN, APRN, ANVP-BC, AGPCNP-BC Advanced Practice Stroke Nurse [place] for Schedule & Pager information 04/16/2018 2:51 PM   ATTENDING NOTE: I reviewed above note and agree with the assessment and plan. Pt  was seen and examined.   Patient daughter at bedside.  Initial rounding patient sleepy, drowsy, not open eyes on voice and not following commands.  However, 20 minutes after with second rounding, patient was awake alert, orientated to people place, interactive with daughter, moving all extremities.  Will do EEG to rule out seizure.  Patient has been on Provigil 100 mg daily, will increase to 40 mg daily and add on amantadine 100 twice daily to help for arousal.     Patient had T-max 100.8. Sputum culture showed a few staph aureus, currently on Ancef treatment.  PT/OT recommend CIR.   Rosalin Hawking, MD PhD Stroke Neurology 04/16/2018 9:56 PM   To contact Stroke Continuity provider, please refer to [email or web address] After hours, contact General Neurology

## 2018-04-17 ENCOUNTER — Inpatient Hospital Stay (HOSPITAL_COMMUNITY): Payer: Medicare Other

## 2018-04-17 DIAGNOSIS — Z9289 Personal history of other medical treatment: Secondary | ICD-10-CM

## 2018-04-17 LAB — BASIC METABOLIC PANEL
ANION GAP: 8 (ref 5–15)
BUN: 22 mg/dL (ref 8–23)
CHLORIDE: 108 mmol/L (ref 98–111)
CO2: 27 mmol/L (ref 22–32)
CREATININE: 0.87 mg/dL (ref 0.44–1.00)
Calcium: 8.3 mg/dL — ABNORMAL LOW (ref 8.9–10.3)
GFR calc non Af Amer: >60 mL/min (ref >60)
Glucose, Bld: 134 mg/dL — ABNORMAL HIGH (ref 70–99)
POTASSIUM: 3.2 mmol/L — AB (ref 3.5–5.1)
Sodium: 143 mmol/L (ref 135–145)

## 2018-04-17 LAB — CBC
HEMATOCRIT: 28.7 % — AB (ref 36.0–46.0)
HEMOGLOBIN: 9 g/dL — AB (ref 12.0–15.0)
MCH: 27.6 pg (ref 26.0–34.0)
MCHC: 31.4 g/dL (ref 30.0–36.0)
MCV: 88 fL (ref 78.0–100.0)
Platelets: 560 10*3/uL — ABNORMAL HIGH (ref 150–400)
RBC: 3.26 MIL/uL — ABNORMAL LOW (ref 3.87–5.11)
RDW: 15.7 % — ABNORMAL HIGH (ref 11.5–15.5)
WBC: 17.4 10*3/uL — ABNORMAL HIGH (ref 4.0–10.5)

## 2018-04-17 LAB — GLUCOSE, CAPILLARY
GLUCOSE-CAPILLARY: 114 mg/dL — AB (ref 70–99)
GLUCOSE-CAPILLARY: 120 mg/dL — AB (ref 70–99)
Glucose-Capillary: 118 mg/dL — ABNORMAL HIGH (ref 70–99)
Glucose-Capillary: 137 mg/dL — ABNORMAL HIGH (ref 70–99)
Glucose-Capillary: 132 mg/dL — ABNORMAL HIGH (ref 70–99)
Glucose-Capillary: 116 mg/dL — ABNORMAL HIGH (ref 70–99)

## 2018-04-17 MED ORDER — FERROUS SULFATE 325 (65 FE) MG PO TABS
325.0000 mg | ORAL_TABLET | Freq: Two times a day (BID) | ORAL | Status: DC
  Administered 2018-04-18 – 2018-04-22 (×9): 325 mg via ORAL
  Filled 2018-04-17 (×9): qty 1

## 2018-04-17 MED ORDER — ADULT MULTIVITAMIN LIQUID CH
15.0000 mL | Freq: Every day | ORAL | Status: DC
  Administered 2018-04-17 – 2018-04-22 (×6): 15 mL
  Filled 2018-04-17 (×7): qty 15

## 2018-04-17 MED ORDER — OSMOLITE 1.5 CAL PO LIQD
1000.0000 mL | ORAL | Status: DC
  Administered 2018-04-18 – 2018-04-22 (×5): 1000 mL
  Filled 2018-04-17 (×6): qty 1000

## 2018-04-17 MED ORDER — PANTOPRAZOLE SODIUM 40 MG PO PACK
40.0000 mg | PACK | Freq: Two times a day (BID) | ORAL | Status: DC
  Administered 2018-04-17 – 2018-04-22 (×10): 40 mg
  Filled 2018-04-17 (×10): qty 20

## 2018-04-17 NOTE — Progress Notes (Signed)
STROKE TEAM PROGRESS NOTE   INTERVAL HISTORY Granddaughter Ashely at bedside. Pt awake this am, up to 1 hr per Caryl Pina, most she has been spontaneously awake all week.    Vitals:   04/17/18 0807 04/17/18 0808 04/17/18 0809 04/17/18 0832  BP:  (!) 202/80  (!) 171/62  Pulse:  80  75  Resp:  (!) 26  (!) 23  Temp:    99.2 F (37.3 C)  TempSrc:    Axillary  SpO2: 98% 98% 99% 97%  Weight:      Height:        CBC:  Recent Labs  Lab 04/16/18 0455 04/17/18 0512  WBC 16.8* 17.4*  HGB 10.0* 9.0*  HCT 32.0* 28.7*  MCV 87.9 88.0  PLT 558* 560*    Basic Metabolic Panel:  Recent Labs  Lab 04/11/18 0613  04/16/18 0455 04/17/18 0512  NA 139   < > 141 143  K 4.3   < > 3.4* 3.2*  CL 105   < > 107 108  CO2 23   < > 25 27  GLUCOSE 120*   < > 125* 134*  BUN 30*   < > 22 22  CREATININE 0.86   < > 0.80 0.87  CALCIUM 8.4*   < > 8.3* 8.3*  MG 2.2  --   --   --   PHOS 3.9  --   --   --    < > = values in this interval not displayed.   Lipid Panel:     Component Value Date/Time   CHOL 177 03/30/2018 0235   TRIG 67 03/30/2018 2248   HDL 101 03/30/2018 0235   CHOLHDL 1.8 03/30/2018 0235   VLDL 10 03/30/2018 0235   LDLCALC 66 03/30/2018 0235   HgbA1c:  Lab Results  Component Value Date   HGBA1C 5.5 03/30/2018   Urine Drug Screen:     Component Value Date/Time   LABOPIA NONE DETECTED 03/29/2018 1110   COCAINSCRNUR NONE DETECTED 03/29/2018 1110   LABBENZ NONE DETECTED 03/29/2018 1110   AMPHETMU NONE DETECTED 03/29/2018 1110   THCU NONE DETECTED 03/29/2018 1110   LABBARB NONE DETECTED 03/29/2018 1110    Alcohol Level     Component Value Date/Time   ETH <10 03/27/2018 1354    IMAGING Ct Head Wo Contrast 03/27/2018 IMPRESSION:  2 cm hematoma in the deep left cerebellum with fourth ventricular extension extending into the third/lateral ventricles and into the dorsal cervical canal. There is mild hydrocephalus at the lateral ventricles when compared to 2016. A deep  cerebellar origin favors hypertensive etiology.   Ct Angio Head W Or Wo Contrast 03/27/2018 IMPRESSION:  1. No vascular malformation or abnormal enhancement of the brain identified.  2. Stable cerebellar and intraventricular hemorrhage.  3. Patent anterior and posterior intracranial circulation. No large vessel occlusion, aneurysm, or significant stenosis.  4. Calcific atherosclerosis of the carotid and vertebral arteries with mild stenosis.   Ct Head Wo Contrast 03/28/2018 1. Stable volume of intracranial hemorrhage within the left cerebellar hemisphere and the ventricular system. Some redistribution of blood products to the lateral ventricle atria and sylvian fissures.  2. No new acute intracranial abnormality.  3. Interval right frontal approach ventriculostomy catheter placement. Mild decrease in hydrocephalus.  03/30/2018 Stable volume of intraventricular, subarachnoid, and left cerebellar hemorrhage. Stable ventricle size. No new acute intracranial abnormality.  04/01/2018 1. Partial interval dispersion of intraventricular, subarachnoid, and left cerebellar brain parenchymal hemorrhage. 2. Resolution of hydrocephalus. Stable position of  right frontal approach ventriculostomy catheter. 3. No new acute intracranial process.  04/02/2018 1. Recurrent lateral and third ventricular hydrocephalus. Newly seen clot along the EVD, presumed cause of catheter dysfunction. 2. No interval bleeding. 3. Increased low-density along the EVD, likely tracking CSF. 4. No herniation or infarct.  04/10/2018 1. Continued interval evolution of left cerebellar hematoma with slightly improved localized vasogenic edema, with persistent but slightly improved mass effect on the adjacent fourth ventricle. 2. Intraventricular extension with similar small volume intraventricular blood products.  Stable mild hydrocephalus with right frontal ventriculostomy in place. 3. No other new acute intracranial abnormality.  04/12/2018 1.  Interval EVD removal with unchanged mild ventriculomegaly. 2. Unchanged left cerebellar hematoma and surrounding vasogenic edema. 3. Small volume intraventricular hemorrhage without significant change. 4. No evidence of new intracranial abnormality.   CUS 1-39% ICA stenosis.  Vertebral artery flow is antegrade. Significant calcific plaque bilateral bifurcations, however, velocities are not significantly increased.   TTE - Left ventricle: The cavity size was normal. Wall thickness was increased in a pattern of mild LVH. Systolic function was normal. The estimated ejection fraction was in the range of 60% to 65%. Doppler parameters are consistent with both elevated ventricular end-diastolic filling pressure and elevated left atrial filling pressure. - Aortic valve: There was mild stenosis. Valve area (VTI): 1.88 cm^2. Valve area (Vmax): 1.82 cm^2. Valve area (Vmean): 1.59 cm^2. - Mitral valve: Calcified annulus. - Atrial septum: No defect or patent foramen ovale was identified.  Dg Chest Port 1 View  Result Date: 04/16/2018 CLINICAL DATA:  Aspiration pneumonia and vomiting. EXAM: PORTABLE CHEST 1 VIEW COMPARISON:  April 16, 2018 7:06 a.m. FINDINGS: The heart size and mediastinal contours are stable. The heart size is enlarged. Tracheostomy tube is unchanged. Right central venous line is unchanged. At the upper of tube is identified distal tip not included on film. Patchy consolidation of the left lung base and right infrahilar region are noted. There is mild interstitial edema. The visualized skeletal structures are stable. IMPRESSION: Patchy opacity of the left lung base and right infrahilar region suspicious for pneumonia. Mild interstitial edema. Electronically Signed   By: Abelardo Diesel M.D.   On: 04/16/2018 15:30   Dg Chest Port 1 View  Result Date: 04/16/2018 CLINICAL DATA:  Shortness of breath EXAM: PORTABLE CHEST 1 VIEW COMPARISON:  April 13, 2018 FINDINGS: Tracheostomy catheter tip is  4.6 cm above the carina. Feeding tube tip is below the diaphragm. Central catheter tip is in the superior vena cava. No pneumothorax. There is patchy airspace opacity in the right lower lobe. There is a small left pleural effusion with medial left base atelectasis. Heart is upper normal in size with pulmonary vascularity normal. No adenopathy. No bone lesions. IMPRESSION: Tube and catheter positions as described without pneumothorax. Patchy airspace opacity, likely pneumonia, right base. Atelectasis medial left base with small left pleural effusion. Stable cardiac prominence. Electronically Signed   By: Lowella Grip III M.D.   On: 04/16/2018 10:03   Dg Abd Portable 1v  Result Date: 04/16/2018 CLINICAL DATA:  Vomiting EXAM: PORTABLE ABDOMEN - 1 VIEW COMPARISON:  April 03, 2018 FINDINGS: The bowel gas pattern is normal. A feeding tube is identified with the distal tip in the distal stomach. Right hip replacement is identified. IMPRESSION: No bowel obstruction. Feeding tube is identified with distal tip in distal stomach. Electronically Signed   By: Abelardo Diesel M.D.   On: 04/16/2018 15:31     PHYSICAL EXAM General - Well nourished,  well developed, on trach collar. Cardiovascular - Regular rate and rhythm. Skin - R FA edema resolved, Skin above trach reddened and sore to touch - smaller than yesterday, less edema. No skin breakdown. Trach collar not tight. Crusting noted.  Neuro - on trach collar, eyes open, interactive, mouths words/reaction to questions. Will follow simple commands. Knows name, age, place. Pupils are equal and reactive to light. Eyes left gaze preference. Blinking to visual threat on the left but not on the right. Slight Right facial droop. MAE x 4 against gravity BUE 3+/5 and BLE 3/5. babinski positive bilaterally. Sensation intact. coordination and gait not tested.   ASSESSMENT/PLAN Ms. GULIANA WEYANDT is a 77 y.o. female with history of COPD, CHF, HTN, HLD, CKD III, CAD s/p  stent presenting with sudden onset nausea, vomiting and dizziness.   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  Mild hydrocephalus lateral ventricles.    CTA head no vascular malformation or abnormal enhancement.  Stable cerebellar and IVH.  No LVO.  Intracranial atherosclerosis.  Repeat CT x 3 - Stable volume of intracranial hemorrhage and hydrocephalus  CUS - Significant calcific plaque bilateral bifurcations, however, velocities are not significantly increased.  2D Echo EF 60-65%  EEG negative for seizure  UDS neg  LDL 66  HgbA1c 5.5  Heparin subq for VTE prophylaxis  aspirin 81 mg daily and Brilinta 90 mg twice daily prior to admission, now on no antithrombotics d/t hmg  Therapy recommendations:  CIR.   Disposition:  Pending. Would likely be a good LTAC candidate if family do not want to pursue palliative care  Palliative consult for long-term care options. Family do not want long-term PEG.  Lethargy  provigil increased to 200 mg daily and added amantadine yesterday  Improving   Obstructive hydrocephalus s/p R frontal EVD   EVD placed 8/22, replaced 8/28  CT showed recurrent hydro prior to EVD replacement  Repeat CT stable after EVD replacement  Repeat CT stable 9/5 after 48h of EVD clamping  EVD removed on 04/10/18  NSG signed off  CT repeat 04/12/18 stable mild hydrocephalus  Remove staples from head  MSSA Pneumonia Tracheobronchitis Leukocytosis  T Max  99.2  UA neg for UTI,   Blood culture NNGTD  Leukocytosis  17.4  On home prednisone for COPD  Sputum Cx +MSSA  Started on ancef 9/10 by CCM  D3/7  Repeat CXR yest left effusion  and patchy R base airspace dz felt to be PNA  Carotid stenosis   CUS 08/2017 R ICA stenosis 50-69%, L ICA < 50%  CUS repeat showed b/l ICA 1-39% stenosis but significant calcified plaque at carotid bifurcation bilaterally.   Acute Hypoxic Respiratory failure - extubation and re-intubation S/p Trach placement  Intubated in the ED with neuro decline   CCM on board  Extubated and re-intubated 04/03/18  Trach done 04/11/18  Currently on trach collar, tolerating well  CCM recommends cuffess trach at time of suture removal   Dysphagia secondary to stroke  NPO on tube feeds.   Family does not think she/they would want PEG  Hypertensive Emergency  Treated with cardene in ICU  Home Meds: Norvasc 10, resumed  Currently on norvasc 10 and hydralazine 50 Q8  SBP goal < 180  AKI on CKD  Cre 0.87  On IVF to 50cc  Na 143  Continue monitoring  Hyperlipidemia  Home meds:  ?? Lipitor 80   LDL 66  Hold statin for now. Recheck in 6-8 weeks. If remains low, no statin needed. If elevated, resume statin.   Other Stroke Risk Factors  Advanced age  Former Cigarette smoker, quit 2 years ago  Coronary artery disease s/p stent - on ASA and brilinta PTA  Chronic Congestive heart failure  Anemia.   Essentially stable this admission 7.8->6.9->10->9.   Last documented 11.0 Feb 2018  2u PRBC 9/9  check stool guiac pending   Check Fer 94, VB12 403, lactate 166, haptoglobin 413, Folate 14.8, FE studies 94   Other Active Problems  Hx vertigo   COPD - on prednisone, weaning underway   Hypokalemia 3.2  ?Vomiting vs spit up - KUB with increased bowel pattern. ABD xray 9/11 withou obstruction.   Hospital day # [place], MSN, APRN, ANVP-BC, AGPCNP-BC Advanced Practice Stroke Nurse Peter Spencer for Schedule & Pager information 04/17/2018 8:52 AM    To contact Stroke Continuity provider, please refer to [email or web address] After hours, contact  General Neurology

## 2018-04-17 NOTE — Progress Notes (Signed)
  Speech Language Pathology Treatment: Nada Boozer Speaking valve  Patient Details Name: Amber Rogers MRN: [id number] DOB: [birth year] Today's Date: 04/17/2018 Time: 2633-3545 SLP Time Calculation (min) (ACUTE ONLY): 8 min  Assessment / Plan / Recommendation Clinical Impression  Treatment focused on pmv use. Patient lethargic and arousable but requires moderate-max cueing to maintain adequate level of arousal. Oral care provided to aid in ability to mobilize lips and teeth. Patient mouthing words in response to clinician questioning intermittently. PMV placed on trach hub with already inflated cuff. Patient with immediate pressure on trach hub and no evidence of upper airway patency despite multiple attempts and clinician cueing for attempts to clear secretions. Strongly suspect that trach will need to be downsized before patient is able to utilize valve. Will continue to f/u. Patient not consistently alert enough today for po trials.   HPI HPI: 77 year old female with PMH of HTN, CKD, CHF, and COPD. She presented to [facility] ED 8/22 with complaints of headache, nausea, and vomiting. 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 ETT 8/22-29;  reintubated same day due to stridor; ETT 8/29-9/5; reintubated again due to stridor; tracheostomy 9/6.       SLP Plan          Recommendations  Diet recommendations: NPO Medication Administration: Via alternative means      Patient may use Passy-Muir Speech Valve: with SLP only PMSV Supervision: Full MD: Please consider changing trach tube to : Smaller size;Cuffless         Oral Care Recommendations: Oral care QID Follow up Recommendations: Inpatient Rehab SLP Visit Diagnosis: Aphonia (R49.1)       GO              Bristol Soy MA, CCC-SLP    Sherae Santino Meryl 04/17/2018, 10:03 AM

## 2018-04-17 NOTE — Progress Notes (Signed)
SLP Cancellation Note  Patient Details Name: Amber Rogers MRN: [id number] DOB: 1941/01/14   Cancelled treatment:       Reason Eval/Treat Not Completed: Patient at procedure or test/unavailable  Gabriel Rainwater MA, CCC-SLP   Carmelia Tiner Meryl 04/17/2018, 8:58 AM

## 2018-04-17 NOTE — Clinical Social Work Note (Signed)
LATE NOTE ENTRY   Clinical Social Work Assessment  Patient Details  Name: ZIONNA HOMEWOOD MRN: [id number] Date of Birth: 1940/08/15  Date of referral:  04/15/18               Reason for consult:  Emotional/Coping/Adjustment to Illness, [place] Placement                Permission sought to share information with:  Facility Sport and exercise psychologist, Family Supports Permission granted to share information::  Yes, Verbal Permission Granted  Name::     Lenoria Chime  Agency::     Relationship::  Daughters  Sport and exercise psychologist Information:     Housing/Transportation Living arrangements for the past 2 months:  Single Family Home Source of Information:  Adult Children, Medical Team Patient Interpreter Needed:  None Criminal Activity/Legal Involvement Pertinent to Current Situation/Hospitalization:  No - Comment as needed Significant Relationships:  Adult Children, Friend Lives with:  Self Do you feel safe going back to the place where you live?  Yes Need for family participation in patient care:  Yes (Comment)  Care giving concerns:  Patient from home with a "friend" but daughters indicate that she will not be able to return there in her current condition. Patient will need rehabilitation at discharge, and unsure of what patient will need after that, if patient will recover to go home with family support or need long term care. Patient's daughters are unsure what to do.   Social Worker assessment / plan:  CSW met with patient's daughters per request to discuss their questions and concerns. CSW introduced self, engaged in discussion, and answered questions. CSW provided information on potential discharge planning concerns, including applying for Medicaid, looking into facility options, and determining whether or not patient could participate in any HCPOA decision-making. CSW to advocate for patient with MD on whether or not she is able to give her own consent and for obtaining a  palliative care consult for daughters to discuss goals of care.  Employment status:  Retired Forensic scientist:  Medicare PT Recommendations:  Inpatient [place] / Referral to community resources:     Patient/Family's Response to care:  Patient's daughters are agreeable to whatever the patient needs, they just want her to hopefully get better.  Patient/Family's Understanding of and Emotional Response to Diagnosis, Current Treatment, and Prognosis:  Patient's daughters acknowledged feeling overwhelmed and not sure where to turn at this point. Patient's daughters are hopeful that the patient will improve, but are also realistic that she may not. Patient's daughters had a lot of questions about what happens if the patient doesn't improve and when they would decide to stop being aggressive with treatment, and patient's daughters were hopeful for a palliative meeting to discuss their concerns.  Emotional Assessment Appearance:  Appears stated age Attitude/Demeanor/Rapport:  Unable to Assess Affect (typically observed):  Unable to Assess Orientation:  (unable to assess) Alcohol / Substance use:  Not Applicable Psych involvement (Current and /or in the community):  No (Comment)  Discharge Needs  Concerns to be addressed:  Care Coordination Readmission within the last 30 days:  No Current discharge risk:  Physical Impairment, Cognitively Impaired, Dependent with Mobility, Lives alone Barriers to Discharge:  Continued Medical Work up   [employer], [place] 04/17/2018, 3:02 PM

## 2018-04-17 NOTE — Progress Notes (Signed)
Palliative:  Spoke with patient's daughter, [name], via telephone and introduced palliative care. She is agreeable to [place] meeting tomorrow with her sister Jeani Hawking. Will plan on meeting 9/13 at 14:30.  Thank you for this consult.  Juel Burrow, DNP, AGNP-C Palliative Medicine Team Team Phone # [phone]  Pager # [phone]  NO CHARGE

## 2018-04-17 NOTE — Procedures (Signed)
ELECTROENCEPHALOGRAM REPORT   Patient: Amber Rogers       Room #: 9P11E EEG No. ID: [id number] Age: 77 y.o.        Sex: female Referring Physician: Erlinda Hong Report Date:  04/17/2018        Interpreting Physician: Alexis Goodell  History: Amber Rogers is an 77 y.o. female with cerebellar hematoma and hydrocephalus  Medications:  Symmetrel, Norvasc, Brovana, Pulmicort, Ancef, Apresoline, Provigil, Protonix, Deltasone  Conditions of Recording:  This is a 21 channel routine scalp EEG performed with bipolar and monopolar montages arranged in accordance to the international 10/20 system of electrode placement. One channel was dedicated to EKG recording.  The patient is in the intubated and poorly responsive state.  Description:  Muscle and movement artifact are prominent throughout the recording often obscuring the background rhythm.  When able to be visualized the background activity is slow and poorly organized.  It consists of a low voltage mixture of theta and delta activity.  This activity is persistent throughout the recording and diffusely distributed.   No epileptiform activity is noted.  Hyperventilation and intermittent photic stimulation were not performed.  IMPRESSION: This is an abnormal EEG secondary to general background slowing.  This finding may be seen with a diffuse disturbance that is etiologically nonspecific, but may include a metabolic encephalopathy, among other possibilities.  No epileptiform activity was noted.     Alexis Goodell, MD Neurology [phone] 04/17/2018, 10:58 AM

## 2018-04-17 NOTE — Progress Notes (Signed)
EEG completed; results pending.    

## 2018-04-17 NOTE — Consult Note (Signed)
Consultation Note Date: 04/17/2018   Patient Name: Amber Rogers  DOB: 1940/08/29  MRN: [id number]  Age / Sex: 77 y.o., female  PCP: [name], MD Referring Physician: Rosalin Hawking, MD  Reason for Consultation: Establishing goals of care  HPI/Patient Profile: 77 y.o. female  with past medical history of COPD, CHF, HTN, HLD, CKD3, and CAD s/p stent admitted on 03/27/2018 with nausea, vomiting, and dizziness. 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 Patient intubated on admission for airway protection. Patient failed extubation x 2. Trach placed 9/6. Now on trach collar. Patient continues to have lethargy. CXR now shows PNA. Patient currently has cortrak with continuous tube feeds. Had vomiting episode 9/11 and tube feeds placed on hold. Family does not want long-term PEG. Palliative consult for GOC.  Clinical Assessment and Goals of Care: I have reviewed medical records including EPIC notes, labs and imaging, received report from RN, assessed the patient and then met with patient's 2 daughters, Amber Rogers and Amber Rogers,  to discuss diagnosis prognosis, GOC, EOL wishes, disposition and options.  I introduced Palliative Medicine as specialized medical care for people living with serious illness. It focuses on providing relief from the symptoms and stress of a serious illness. The goal is to improve quality of life for both the patient and the family.  As far as functional and nutritional status, they tell me prior to the stroke, patient was independent. She had health concerns - COPD and CHF - but good functional and nutritional status baseline.   We discussed her current illness and what it means in the larger context of her on-going co-morbidities. We specifically discussed Ms. Timm' stroke, prolonged hospitalization, respiratory concerns, and dysphagia. Discussed concern of possible aspiration.  I attempted to elicit values and goals  of care important to the patient. They are clear that quality of life is important to patient. They express concerns that the patient is suffering. They share how hard it is to make decisions because of patient's "good days" followed by "bad days".  They share desire to see how patient does over the weekend - they are hopeful she "declares herself" - either continuing to improve or continuing decline - so they are better able to make decisions moving forward.   The difference between aggressive medical intervention and comfort care was considered in light of the patient's goals of care. They share that if patient continues to decline, they would like to focus on her comfort. However, if she shows signs of improvement (more alert, able to participate in therapy) - they will proceed with aggressive medical care - possibly including PEG tube.  Advance directives, concepts specific to code status, artifical feeding and hydration, and rehospitalization were considered and discussed. We specifically discussed the patient's code status - Vaughan Basta and Amber Rogers agree to DNR status. We discussed what this means thoroughly. Discussed that all other medical interventions would continue; however, in the event of an arrest patient would be allowed a natural death. They agree to this.   The daughters ask about possibilities if the patient did continue to decline; so we discussed comfort care and hospice. They ask more about hospice - we discussed hospice facility or receiving hospice at home.   Questions and concerns were addressed.  The family was encouraged to call with questions or concerns.   We discussed PMT to follow up early next week and discuss goals of care further depending on how patient does.  Primary Decision Maker PATIENT - patient  lethargic today, decisions made jointly with daughters, Amber Rogers and Amber Rogers   SUMMARY OF RECOMMENDATIONS   - initial palliative conversation, education completed, family with good  understanding of patient condition and decisions they are faced with - express desire to see how patient does over the next few days, they are hopeful she "declares herself" either continuing to improve or decline so they are better able to make decisions - PMT will see next week - likely Tuesday - to revisit [place] - Code status changed to DNR  Code Status/Advance Care Planning:  DNR  Symptom Management:   Per primary team  Palliative Prophylaxis:   Aspiration, Delirium Protocol, Frequent Pain Assessment, Oral Care and Turn Reposition  Additional Recommendations (Limitations, Scope, Preferences):  Full Scope Treatment  Prognosis:   Unable to determine  Discharge Planning: To Be Determined      Primary Diagnoses: Present on Admission: . ICH (intracerebral hemorrhage) ([place])   I have reviewed the medical record, interviewed the patient and family, and examined the patient. The following aspects are pertinent.  Past Medical History:  Diagnosis Date  . Asthma   . CHF (congestive heart failure) ([place])   . Chronic kidney disease (CKD), active medical management without dialysis, stage 3 (moderate) ([place])   . COPD (chronic obstructive pulmonary disease) ([place])   . Coronary artery disease   . High cholesterol   . Hypertension   . Myocardial infarction ([place]) 04/2015  . Osteoarthritis    "all over" (09/27/2016)  . Pneumonia 1996   Social History   Socioeconomic History  . Marital status: Divorced    Spouse name: Not on file  . Number of children: Not on file  . Years of education: Not on file  . Highest education level: Not on file  Occupational History  . Not on file  Social Needs  . Financial resource strain: Not on file  . Food insecurity:    Worry: Not on file    Inability: Not on file  . Transportation needs:    Medical: Not on file    Non-medical: Not on file  Tobacco Use  . Smoking status: Former Smoker    Packs/day: 0.50    Years: 40.00    Pack years:  20.00    Types: Cigarettes    Last attempt to quit: 04/14/2015    Years since quitting: 3.0  . Smokeless tobacco: Never Used  Substance and Sexual Activity  . Alcohol use: No    Alcohol/week: 0.0 standard drinks  . Drug use: No  . Sexual activity: Never  Lifestyle  . Physical activity:    Days per week: Not on file    Minutes per session: Not on file  . Stress: Not on file  Relationships  . Social connections:    Talks on phone: Not on file    Gets together: Not on file    Attends religious service: Not on file    Active member of club or organization: Not on file    Attends meetings of clubs or organizations: Not on file    Relationship status: Not on file  Other Topics Concern  . Not on file  Social History Narrative  . Not on file   History reviewed. No pertinent family history. Scheduled Meds: . amantadine  100 mg Oral BID  . amLODipine  10 mg Per Tube Daily  . arformoterol  15 mcg Nebulization BID  . budesonide (PULMICORT) nebulizer solution  0.5 mg Nebulization BID  . chlorhexidine  15 mL  Mouth Rinse BID  . Chlorhexidine Gluconate Cloth  6 each Topical Daily  . [START ON 04/18/2018] feeding supplement (OSMOLITE 1.5 CAL)  1,000 mL Per Tube Q24H  . feeding supplement (PRO-STAT SUGAR FREE 64)  30 mL Per Tube Daily  . hydrALAZINE  50 mg Oral Q8H  . ipratropium-albuterol  3 mL Nebulization TID  . mouth rinse  15 mL Mouth Rinse q12n4p  . modafinil  200 mg Oral Daily  . multivitamin  15 mL Per Tube Daily  . pantoprazole sodium  40 mg Per Tube Daily  . predniSONE  2.5 mg Per Tube Q breakfast  . sodium chloride flush  10-40 mL Intracatheter Q12H   Continuous Infusions: . sodium chloride 50 mL/hr at 04/16/18 0012  .  ceFAZolin (ANCEF) IV 2 g (04/17/18 0600)   PRN Meds:.[DISCONTINUED] acetaminophen **OR** acetaminophen (TYLENOL) oral liquid 160 mg/5 mL **OR** acetaminophen, docusate, hydrALAZINE, ipratropium-albuterol, ondansetron (ZOFRAN) IV, sodium chloride flush No  Known Allergies Review of Systems  Unable to perform ROS: Mental status change  Eyes: Positive for pain.    Physical Exam  Constitutional: She appears lethargic. She is uncooperative. She has a sickly appearance. No distress.  Cardiovascular: Normal rate and regular rhythm.  Pulmonary/Chest: Effort normal. No accessory muscle usage. No respiratory distress.  Tracheostomy with trach collar, cough noted  Abdominal: Soft.  Musculoskeletal: She exhibits no edema.  Neurological: She appears lethargic. She is disoriented.  Skin: Skin is warm and dry.    Vital Signs: BP (!) 152/62 (BP Location: Left Arm)   Pulse 79   Temp 98.1 F (36.7 C) (Oral)   Resp (!) 29   Ht _0  (1.676 m)   Wt 74.4 kg   SpO2 96%   BMI 26.47 kg/m  Pain Scale: 0-10   Pain Score: 0-No pain   SpO2: SpO2: 96 % O2 Device:SpO2: 96 % O2 Flow Rate: .O2 Flow Rate (L/min): 8 L/min  IO: Intake/output summary:   Intake/Output Summary (Last 24 hours) at 04/17/2018 1329 Last data filed at 04/17/2018 1203 Gross per 24 hour  Intake 2969.88 ml  Output 2200 ml  Net 769.88 ml    LBM: Last BM Date: 04/15/18 Baseline Weight: Weight: 66 kg Most recent weight: Weight: 74.4 kg     Palliative Assessment/Data: PPS 30%     Time Total: 70 minutes Greater than 50%  of this time was spent counseling and coordinating care related to the above assessment and plan.  Juel Burrow, DNP, AGNP-C Palliative Medicine Team [phone] Pager: [phone]

## 2018-04-17 NOTE — Progress Notes (Signed)
STROKE TEAM PROGRESS NOTE   INTERVAL HISTORY Patient's RN is at bedside. She is more awake alert today and RN is doing tracheal suctioning. Her eyes open and interactive/gretting to me when I wave to her. TF changed from bolus feeding to continueous feeding. EEG done no seizure.    Vitals:   04/17/18 1156 04/17/18 1200 04/17/18 1400 04/17/18 1429  BP: (!) 152/62 (!) 152/62 (!) 167/67   Pulse:  71 77   Resp:  (!) 28 (!) 27   Temp: 98.1 F (36.7 C)     TempSrc: Oral     SpO2:  95% 99% 97%  Weight:      Height:        CBC:  Recent Labs  Lab 04/16/18 0455 04/17/18 0512  WBC 16.8* 17.4*  HGB 10.0* 9.0*  HCT 32.0* 28.7*  MCV 87.9 88.0  PLT 558* 560*    Basic Metabolic Panel:  Recent Labs  Lab 04/11/18 0613  04/16/18 0455 04/17/18 0512  NA 139   < > 141 143  K 4.3   < > 3.4* 3.2*  CL 105   < > 107 108  CO2 23   < > 25 27  GLUCOSE 120*   < > 125* 134*  BUN 30*   < > 22 22  CREATININE 0.86   < > 0.80 0.87  CALCIUM 8.4*   < > 8.3* 8.3*  MG 2.2  --   --   --   PHOS 3.9  --   --   --    < > = values in this interval not displayed.   Lipid Panel:     Component Value Date/Time   CHOL 177 03/30/2018 0235   TRIG 67 03/30/2018 2248   HDL 101 03/30/2018 0235   CHOLHDL 1.8 03/30/2018 0235   VLDL 10 03/30/2018 0235   LDLCALC 66 03/30/2018 0235   HgbA1c:  Lab Results  Component Value Date   HGBA1C 5.5 03/30/2018   Urine Drug Screen:     Component Value Date/Time   LABOPIA NONE DETECTED 03/29/2018 1110   COCAINSCRNUR NONE DETECTED 03/29/2018 1110   LABBENZ NONE DETECTED 03/29/2018 1110   AMPHETMU NONE DETECTED 03/29/2018 1110   THCU NONE DETECTED 03/29/2018 1110   LABBARB NONE DETECTED 03/29/2018 1110    Alcohol Level     Component Value Date/Time   ETH <10 03/27/2018 1354    IMAGING Ct Head Wo Contrast 03/27/2018 IMPRESSION:  2 cm hematoma in the deep left cerebellum with fourth ventricular extension extending into the third/lateral ventricles and into  the dorsal cervical canal. There is mild hydrocephalus at the lateral ventricles when compared to 2016. A deep cerebellar origin favors hypertensive etiology.   Ct Angio Head W Or Wo Contrast 03/27/2018 IMPRESSION:  1. No vascular malformation or abnormal enhancement of the brain identified.  2. Stable cerebellar and intraventricular hemorrhage.  3. Patent anterior and posterior intracranial circulation. No large vessel occlusion, aneurysm, or significant stenosis.  4. Calcific atherosclerosis of the carotid and vertebral arteries with mild stenosis.   Ct Head Wo Contrast 03/28/2018 1. Stable volume of intracranial hemorrhage within the left cerebellar hemisphere and the ventricular system. Some redistribution of blood products to the lateral ventricle atria and sylvian fissures.  2. No new acute intracranial abnormality.  3. Interval right frontal approach ventriculostomy catheter placement. Mild decrease in hydrocephalus.  03/30/2018 Stable volume of intraventricular, subarachnoid, and left cerebellar hemorrhage. Stable ventricle size. No new acute intracranial abnormality.  04/01/2018  1. Partial interval dispersion of intraventricular, subarachnoid, and left cerebellar brain parenchymal hemorrhage. 2. Resolution of hydrocephalus. Stable position of right frontal approach ventriculostomy catheter. 3. No new acute intracranial process.  04/02/2018 1. Recurrent lateral and third ventricular hydrocephalus. Newly seen clot along the EVD, presumed cause of catheter dysfunction. 2. No interval bleeding. 3. Increased low-density along the EVD, likely tracking CSF. 4. No herniation or infarct.  04/10/2018 1. Continued interval evolution of left cerebellar hematoma with slightly improved localized vasogenic edema, with persistent but slightly improved mass effect on the adjacent fourth ventricle. 2. Intraventricular extension with similar small volume intraventricular blood products.  Stable mild  hydrocephalus with right frontal ventriculostomy in place. 3. No other new acute intracranial abnormality.  04/12/2018 1. Interval EVD removal with unchanged mild ventriculomegaly. 2. Unchanged left cerebellar hematoma and surrounding vasogenic edema. 3. Small volume intraventricular hemorrhage without significant change. 4. No evidence of new intracranial abnormality.   CUS 1-39% ICA stenosis.  Vertebral artery flow is antegrade. Significant calcific plaque bilateral bifurcations, however, velocities are not significantly increased.   TTE - Left ventricle: The cavity size was normal. Wall thickness was increased in a pattern of mild LVH. Systolic function was normal. The estimated ejection fraction was in the range of 60% to 65%. Doppler parameters are consistent with both elevated ventricular end-diastolic filling pressure and elevated left atrial filling pressure. - Aortic valve: There was mild stenosis. Valve area (VTI): 1.88 cm^2. Valve area (Vmax): 1.82 cm^2. Valve area (Vmean): 1.59 cm^2. - Mitral valve: Calcified annulus. - Atrial septum: No defect or patent foramen ovale was identified.  EEG This is an abnormal EEG secondary to general background slowing.  This finding may be seen with a diffuse disturbance that is etiologically nonspecific, but may include a metabolic encephalopathy, among other possibilities.  No epileptiform activity was noted.    PHYSICAL EXAM General - Well nourished, well developed, on trach collar. Cardiovascular - Regular rate and rhythm. Skin - R FA edema (likely from dependency), R arm IV site ok without upper arm edema. Skin above trach reddened and sore to touch. No skin breakdown. Trach collar not tight.  Neuro - on trach collar, eyes open, greeting to provider, interactive. Orientated to place and people. Mouth words appropriately. Follows all simple commands.  Pupils are equal and reactive to light. Eyes left gazed preference, able to cross midline,  blinking to visual threat bilaterally. Corneal reflexes are brinsk. Mild right facial droop. Moving all 4 extremities symmetrically, able to against gravity. Deep tendon reflexes are 1+ symmetric, but babinski positive bilaterally. Sensation, coordination not cooperative and gait not tested.   ASSESSMENT/PLAN Ms. SHERE EISENHART is a 77 y.o. female with history of COPD, CHF, HTN, HLD, CKD III, CAD s/p stent presenting with sudden onset nausea, vomiting and dizziness.   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  Mild hydrocephalus lateral ventricles.    CTA head no vascular malformation or abnormal enhancement.  Stable cerebellar and IVH.  No LVO.  Intracranial atherosclerosis.  Repeat CT x 3 - Stable volume of intracranial hemorrhage and hydrocephalus  CUS - Significant calcific plaque bilateral bifurcations, however, velocities are not significantly increased.  2D Echo EF 60-65%  UDS neg  LDL 66  HgbA1c 5.5  Heparin subq for VTE prophylaxis  aspirin 81 mg daily and Brilinta 90 mg twice daily prior to admission, now on no antithrombotics d/t hmg  Therapy recommendations:  CIR  Disposition:  pending   Obstructive hydrocephalus s/p R frontal EVD   EVD placed 8/22, replaced 8/28  CT showed recurrent hydro prior to EVD replacement  Repeat CT stable after EVD replacement  Repeat CT stable 9/5 after 48h of EVD clamping  EVD removed on 04/10/18  NSG signed off  CT repeat 04/12/18 stable mild hydrocephalus  Pneumonia Tracheobronchitis Leukocytosis  Temp 101.2 -> normal  UA neg for UTI   Blood culture NGTD  Leukocytosis  19.1->18.1->15.1->16.8->17.4  On home  prednisone for COPD  Sputum Cx +MSSA  Started on ancef 9/10 by CCM  D2/7  Repeat CXR left effusion and patchy R base airspace dz felt to be PNA  Tracheal suctioning Q2h  Chest PT tid  Encephalopathy  More awake alert today, interactive   Increased provigil to 200mg  daily   Add amantadine 100mg  bid   Seems effective   EEG no seizure but diffuse slow background  Carotid stenosis   CUS 08/2017 R ICA stenosis 50-69%, L ICA < 50%  CUS repeat showed b/l ICA 1-39% stenosis but significant calcified plaque at carotid bifurcation bilaterally.   Acute Hypoxic Respiratory failure - extubation and re-intubation S/p Trach placement  Intubated in the ED with neuro decline   CCM on board  Extubated and re-intubated 04/03/18  Trach done 04/11/18  Currently on trach collar, tolerating well  CCM recommends cuffess trach at time of suture removal   Dysphagia secondary to stroke  NPO on tube feeds  Change bolus feeds to continuous feeding  Family does not think she/they would want PEG  Hypertensive Emergency  BP stable off cardene  Home Meds: Norvasc 10, resumed  Currently on norvasc 10 and hydralazine 50 Q8  SBP goal < 160  AKI on CKD  Cre 0.92->0.87  On IVF 50cc  D/c free water  Na 142->143  Continue monitoring  Hyperlipidemia  Home meds:  ?? Lipitor 80   LDL 66  Hold statin for now. Recheck in 6-8 weeks. If remains low, no statin needed. If elevated, resume statin.   Other Stroke Risk Factors  Advanced age  Former Cigarette smoker, quit 2 years ago  Coronary artery disease s/p stent - on ASA and brilinta PTA  Chronic Congestive heart failure  Anemia  Hgb 6.9. Essentially stable this admission 7.8->6.9.   Last documented 11.0 Feb 2018  2u PRBCs 04/15/18.  Hgb now 10.0 post transfurtion ->9.0  check stool guiac positive   Check Fer 94, iron 130, VB12 403, LDH 166, haptoglobin 413, Folate 14.8   Low MCH and high RDW  Increase  protonix to 40mg  bid  Add iron 325mg  bid  Other Active Problems  Hx vertigo   COPD - on home prednisone per tube  Hypokalemia 3.4  ?Vomiting vs spit up - KUB with increased bowel pattern. CCM following  Hospital day # 21   Rosalin Hawking, MD PhD Stroke Neurology 04/17/2018 3:08 PM   To contact Stroke Continuity provider, please refer to [email or web address] After hours, contact General Neurology  I spent  35 minutes in total face-to-face time with the patient, more than 50% of which was spent in counseling and coordination of care, reviewing test results, images and medication, and discussing the diagnosis of anemia, encephalopathy, ICH with hydrocephalus, treatment plan and potential prognosis. This patient's care  requiresreview of multiple databases, neurological assessment, discussion with family, other specialists and medical decision making of high complexity.

## 2018-04-17 NOTE — Progress Notes (Signed)
Nutrition Brief Note   RD paged per RN regarding pt's current bolus TF regimen via Cortrak feeding tube "going in too slow". RN experiencing resistance with administration. Verbal with Read Back order received per Dr. Erlinda Hong to change pt's TF regimen to continuous.   RD to adjust TF order to Osmolite 1.5 at goal rate of 40 ml/hr. Prostat 30 ml daily to continue. Total TF regimen to provide 1540 kcals, 75 gm protein, 731 ml free water daily. Will also add liquid MVI to meet 100% of RDIs. RD spoke with RN regarding change in TF orders. Will continue to follow.  Arthur Holms, RD, LDN Pager #: [phone] After-Hours Pager #: [phone]

## 2018-04-18 ENCOUNTER — Inpatient Hospital Stay (HOSPITAL_COMMUNITY): Payer: Medicare Other

## 2018-04-18 DIAGNOSIS — J449 Chronic obstructive pulmonary disease, unspecified: Secondary | ICD-10-CM

## 2018-04-18 DIAGNOSIS — Z7189 Other specified counseling: Secondary | ICD-10-CM

## 2018-04-18 DIAGNOSIS — R0602 Shortness of breath: Secondary | ICD-10-CM

## 2018-04-18 DIAGNOSIS — Z515 Encounter for palliative care: Secondary | ICD-10-CM

## 2018-04-18 LAB — GLUCOSE, CAPILLARY
GLUCOSE-CAPILLARY: 113 mg/dL — AB (ref 70–99)
GLUCOSE-CAPILLARY: 118 mg/dL — AB (ref 70–99)
GLUCOSE-CAPILLARY: 158 mg/dL — AB (ref 70–99)
Glucose-Capillary: 113 mg/dL — ABNORMAL HIGH (ref 70–99)
Glucose-Capillary: 141 mg/dL — ABNORMAL HIGH (ref 70–99)
Glucose-Capillary: 149 mg/dL — ABNORMAL HIGH (ref 70–99)

## 2018-04-18 LAB — BASIC METABOLIC PANEL
Anion gap: 13 (ref 5–15)
BUN: 22 mg/dL (ref 8–23)
CHLORIDE: 103 mmol/L (ref 98–111)
CO2: 25 mmol/L (ref 22–32)
Calcium: 8.1 mg/dL — ABNORMAL LOW (ref 8.9–10.3)
Creatinine, Ser: 0.93 mg/dL (ref 0.44–1.00)
GFR calc non Af Amer: 59 mL/min — ABNORMAL LOW (ref >60)
Glucose, Bld: 161 mg/dL — ABNORMAL HIGH (ref 70–99)
Potassium: 3.6 mmol/L (ref 3.5–5.1)
SODIUM: 141 mmol/L (ref 135–145)

## 2018-04-18 LAB — CBC
HCT: 34.5 % — ABNORMAL LOW (ref 36.0–46.0)
HEMOGLOBIN: 10.5 g/dL — AB (ref 12.0–15.0)
MCH: 27.6 pg (ref 26.0–34.0)
MCHC: 30.4 g/dL (ref 30.0–36.0)
MCV: 90.8 fL (ref 78.0–100.0)
PLATELETS: 494 10*3/uL — AB (ref 150–400)
RBC: 3.8 MIL/uL — ABNORMAL LOW (ref 3.87–5.11)
RDW: 16 % — ABNORMAL HIGH (ref 11.5–15.5)
WBC: 15.4 10*3/uL — AB (ref 4.0–10.5)

## 2018-04-18 LAB — CULTURE, BLOOD (ROUTINE X 2)
CULTURE: NO GROWTH
Culture: NO GROWTH
SPECIAL REQUESTS: ADEQUATE
Special Requests: ADEQUATE

## 2018-04-18 MED ORDER — FREE WATER
200.0000 mL | Freq: Four times a day (QID) | Status: DC
  Administered 2018-04-19 – 2018-04-21 (×10): 200 mL

## 2018-04-18 MED ORDER — MODAFINIL 100 MG PO TABS
200.0000 mg | ORAL_TABLET | Freq: Every day | ORAL | Status: DC
  Administered 2018-04-19 – 2018-04-22 (×4): 200 mg
  Filled 2018-04-18 (×4): qty 2

## 2018-04-18 MED ORDER — LISINOPRIL 20 MG PO TABS
20.0000 mg | ORAL_TABLET | Freq: Two times a day (BID) | ORAL | Status: DC
  Administered 2018-04-18: 20 mg via ORAL
  Filled 2018-04-18: qty 1

## 2018-04-18 MED ORDER — WHITE PETROLATUM EX OINT
TOPICAL_OINTMENT | CUTANEOUS | Status: AC
  Administered 2018-04-18: 0.2
  Filled 2018-04-18: qty 28.35

## 2018-04-18 MED ORDER — FUROSEMIDE 10 MG/ML IJ SOLN
40.0000 mg | Freq: Once | INTRAMUSCULAR | Status: AC
  Administered 2018-04-18: 40 mg via INTRAVENOUS
  Filled 2018-04-18: qty 4

## 2018-04-18 MED ORDER — LISINOPRIL 20 MG PO TABS
20.0000 mg | ORAL_TABLET | Freq: Two times a day (BID) | ORAL | Status: DC
  Administered 2018-04-19 – 2018-04-22 (×8): 20 mg
  Filled 2018-04-18 (×8): qty 1

## 2018-04-18 NOTE — Progress Notes (Signed)
RT called to PT room SPO2 79% with good waveform.  Pt has labored breathing. RN suctioning pt moderate tan secretions.  Placed on 98% ATC SPO2 now 98% after suction and increase in FIO2.  RT to monitor

## 2018-04-18 NOTE — Progress Notes (Signed)
RN called to room d/t unresponsiveness and not responding to sternal rub. VSS. BG at 117. Patient is with tracheotomy at this time. Family at bedside. MD at bedside assessing pt. Pt able to follow command but with eyes closed.  No orders to follow. Will continue to monitor   [place], RN

## 2018-04-18 NOTE — Progress Notes (Signed)
Physical Therapy Treatment Patient Details Name: Amber Rogers MRN: [id number] DOB: [birth year] Today's Date: 04/18/2018    History of Present Illness 77 year old female with PMH of HTN, CKD, CHF (EF 55% and grade 2 DD in 2016), and COPD. She presented to [facility] ED 8/22 with complaints of headache, nausea, and vomiting with onset of symptoms around 1200. She was found to be hypertensive. CT scan demonstrated hematoma in the left cerebellum with early hydrocephalus. Initially no neurosurgical intervention was indicated due to her reasonable mental status and dual antiplatelet regimen, however, her condition continued to decline and she required intubation. She was then deemed a candidate for EVD placement.    PT Comments    Pt with increased lethargy/fatigue today hindering her participation in therapy. Session limited to UE/LE exercises/ROM in bed. HOB elevated to 55 degrees. Pt not following simple commands consistently. Opens eyes on command 50% of time.  Will attempt OOB to recliner next session if pt more alert.   Follow Up Recommendations  CIR     Equipment Recommendations  Other (comment)(TBD)    Recommendations for Other Services       Precautions / Restrictions Precautions Precautions: Fall;Other (comment) Precaution Comments: trach collar    Mobility  Bed Mobility                  Transfers                    Ambulation/Gait                 Stairs             Wheelchair Mobility    Modified Rankin (Stroke Patients Only)       Balance                                            Cognition Arousal/Alertness: Lethargic Behavior During Therapy: Flat affect Overall Cognitive Status: Difficult to assess                                        Exercises General Exercises - Upper Extremity Shoulder Flexion: PROM;Right;Left;5 reps;Supine Shoulder ABduction: PROM;Right;Left;5 reps;Supine Elbow  Flexion: PROM;Right;Left;5 reps;Supine Elbow Extension: PROM;Right;Left;5 reps;Supine General Exercises - Lower Extremity Ankle Circles/Pumps: PROM;Right;Left;5 reps;Supine Heel Slides: PROM;Right;Left;5 reps;Supine Hip ABduction/ADduction: PROM;Right;Left;5 reps;Supine Straight Leg Raises: PROM;Right;Left;5 reps;Supine    General Comments        Pertinent Vitals/Pain Pain Assessment: Faces Faces Pain Scale: No hurt    Home Living                      Prior Function            PT Goals (current goals can now be found in the care plan section) Acute Rehab PT Goals Patient Stated Goal: unable to state PT Goal Formulation: With patient Time For Goal Achievement: 04/22/18 Potential to Achieve Goals: Fair Progress towards PT goals: Not progressing toward goals - comment(increased lethargy)    Frequency    Min 3X/week      PT Plan Current plan remains appropriate    Co-evaluation              AM-PAC PT "6 Clicks" Daily Activity  Outcome Measure  Difficulty  turning over in bed (including adjusting bedclothes, sheets and blankets)?: Unable Difficulty moving from lying on back to sitting on the side of the bed? : Unable Difficulty sitting down on and standing up from a chair with arms (e.g., wheelchair, bedside commode, etc,.)?: Unable Help needed moving to and from a bed to chair (including a wheelchair)?: Total Help needed walking in hospital room?: Total Help needed climbing 3-5 steps with a railing? : Total 6 Click Score: 6    End of Session Equipment Utilized During Treatment: Oxygen Activity Tolerance: Patient limited by lethargy Patient left: in bed;with call bell/phone within reach;with bed alarm set;with family/visitor present Nurse Communication: Mobility status PT Visit Diagnosis: Other abnormalities of gait and mobility (R26.89);Other symptoms and signs involving the nervous system (J18.841)     Time: 6606-3016 PT Time Calculation (min)  (ACUTE ONLY): 19 min  Charges:  $Therapeutic Exercise: 8-22 mins                     Lorrin Goodell, PT  Office # [phone] Pager [phone]    Amber Rogers 04/18/2018, 1:07 PM

## 2018-04-18 NOTE — Progress Notes (Signed)
Removed 9 staples from pt's head. Tolerated well. No signs of drainage or bleeding. Continuous EEG in place. Some staples may still be under EEG probes. Pt asleep in bed. Deepti Gunawan

## 2018-04-18 NOTE — Progress Notes (Addendum)
STROKE TEAM PROGRESS NOTE   INTERVAL HISTORY Granddaughter Caryl Pina is at the bedside.  This morning, patient much less awake than yesterday per RN and granddaughter.  Otherwise, no change in neurologic status.  For palliative care meeting today with family to determine goals of care to guide future disposition.   Vitals:   04/18/18 0051 04/18/18 0400 04/18/18 0426 04/18/18 0600  BP:   (!) 185/73 (!) 190/77  Pulse: 77 83 83   Resp: (!) 22 20 (!) 27   Temp:   99.3 F (37.4 C)   TempSrc:   Axillary   SpO2: 97% 94% 96%   Weight:   76.3 kg   Height:        CBC:  Recent Labs  Lab 04/16/18 0455 04/17/18 0512  WBC 16.8* 17.4*  HGB 10.0* 9.0*  HCT 32.0* 28.7*  MCV 87.9 88.0  PLT 558* 560*    Basic Metabolic Panel:  Recent Labs  Lab 04/16/18 0455 04/17/18 0512  NA 141 143  K 3.4* 3.2*  CL 107 108  CO2 25 27  GLUCOSE 125* 134*  BUN 22 22  CREATININE 0.80 0.87  CALCIUM 8.3* 8.3*   Lipid Panel:     Component Value Date/Time   CHOL 177 03/30/2018 0235   TRIG 67 03/30/2018 2248   HDL 101 03/30/2018 0235   CHOLHDL 1.8 03/30/2018 0235   VLDL 10 03/30/2018 0235   LDLCALC 66 03/30/2018 0235   HgbA1c:  Lab Results  Component Value Date   HGBA1C 5.5 03/30/2018   Urine Drug Screen:     Component Value Date/Time   LABOPIA NONE DETECTED 03/29/2018 1110   COCAINSCRNUR NONE DETECTED 03/29/2018 1110   LABBENZ NONE DETECTED 03/29/2018 1110   AMPHETMU NONE DETECTED 03/29/2018 1110   THCU NONE DETECTED 03/29/2018 1110   LABBARB NONE DETECTED 03/29/2018 1110    Alcohol Level     Component Value Date/Time   ETH <10 03/27/2018 1354    IMAGING Ct Head Wo Contrast 03/27/2018 IMPRESSION:  2 cm hematoma in the deep left cerebellum with fourth ventricular extension extending into the third/lateral ventricles and into the dorsal cervical canal. There is mild hydrocephalus at the lateral ventricles when compared to 2016. A deep cerebellar origin favors hypertensive etiology.    Ct Angio Head W Or Wo Contrast 03/27/2018 IMPRESSION:  1. No vascular malformation or abnormal enhancement of the brain identified.  2. Stable cerebellar and intraventricular hemorrhage.  3. Patent anterior and posterior intracranial circulation. No large vessel occlusion, aneurysm, or significant stenosis.  4. Calcific atherosclerosis of the carotid and vertebral arteries with mild stenosis.   Ct Head Wo Contrast 03/28/2018 1. Stable volume of intracranial hemorrhage within the left cerebellar hemisphere and the ventricular system. Some redistribution of blood products to the lateral ventricle atria and sylvian fissures.  2. No new acute intracranial abnormality.  3. Interval right frontal approach ventriculostomy catheter placement. Mild decrease in hydrocephalus.  03/30/2018 Stable volume of intraventricular, subarachnoid, and left cerebellar hemorrhage. Stable ventricle size. No new acute intracranial abnormality.  04/01/2018 1. Partial interval dispersion of intraventricular, subarachnoid, and left cerebellar brain parenchymal hemorrhage. 2. Resolution of hydrocephalus. Stable position of right frontal approach ventriculostomy catheter. 3. No new acute intracranial process.  04/02/2018 1. Recurrent lateral and third ventricular hydrocephalus. Newly seen clot along the EVD, presumed cause of catheter dysfunction. 2. No interval bleeding. 3. Increased low-density along the EVD, likely tracking CSF. 4. No herniation or infarct.  04/10/2018 1. Continued interval evolution of left  cerebellar hematoma with slightly improved localized vasogenic edema, with persistent but slightly improved mass effect on the adjacent fourth ventricle. 2. Intraventricular extension with similar small volume intraventricular blood products.  Stable mild hydrocephalus with right frontal ventriculostomy in place. 3. No other new acute intracranial abnormality.  04/12/2018 1. Interval EVD removal with unchanged mild  ventriculomegaly. 2. Unchanged left cerebellar hematoma and surrounding vasogenic edema. 3. Small volume intraventricular hemorrhage without significant change. 4. No evidence of new intracranial abnormality.   CUS 1-39% ICA stenosis.  Vertebral artery flow is antegrade. Significant calcific plaque bilateral bifurcations, however, velocities are not significantly increased.   TTE - Left ventricle: The cavity size was normal. Wall thickness was increased in a pattern of mild LVH. Systolic function was normal. The estimated ejection fraction was in the range of 60% to 65%. Doppler parameters are consistent with both elevated ventricular end-diastolic filling pressure and elevated left atrial filling pressure. - Aortic valve: There was mild stenosis. Valve area (VTI): 1.88 cm^2. Valve area (Vmax): 1.82 cm^2. Valve area (Vmean): 1.59 cm^2. - Mitral valve: Calcified annulus. - Atrial septum: No defect or patent foramen ovale was identified.  Dg Chest Port 1 View  Result Date: 04/16/2018 CLINICAL DATA:  Aspiration pneumonia and vomiting. EXAM: PORTABLE CHEST 1 VIEW COMPARISON:  April 16, 2018 7:06 a.m. FINDINGS: The heart size and mediastinal contours are stable. The heart size is enlarged. Tracheostomy tube is unchanged. Right central venous line is unchanged. At the upper of tube is identified distal tip not included on film. Patchy consolidation of the left lung base and right infrahilar region are noted. There is mild interstitial edema. The visualized skeletal structures are stable. IMPRESSION: Patchy opacity of the left lung base and right infrahilar region suspicious for pneumonia. Mild interstitial edema. Electronically Signed   By: Abelardo Diesel M.D.   On: 04/16/2018 15:30   Dg Abd Portable 1v  Result Date: 04/16/2018 CLINICAL DATA:  Vomiting EXAM: PORTABLE ABDOMEN - 1 VIEW COMPARISON:  April 03, 2018 FINDINGS: The bowel gas pattern is normal. A feeding tube is identified with the distal tip  in the distal stomach. Right hip replacement is identified. IMPRESSION: No bowel obstruction. Feeding tube is identified with distal tip in distal stomach. Electronically Signed   By: Abelardo Diesel M.D.   On: 04/16/2018 15:31     PHYSICAL EXAM General - Well nourished, well developed, on trach collar. Cardiovascular - Regular rate and rhythm. Skin - R FA edema resolved, Skin above trach reddened and sore to touch - unchanged from yesterday -still with redness and edema.  No skin breakdown. Trach collar not tight. Crusting noted. Core track in place without redness/breakdown  Neuro - on trach collar, lethargic, difficult to awaken. No no attempts at speech.  Pupils are equal and reactive to light. left gaze preference. Blinking to visual threat on the left but not on the right. Slight Right facial droop. MAE x 4 against gravity BUE 3+/5 and BLE 3/5. babinski positive bilaterally. Sensation intact. coordination and gait not tested.  Abd Soft and nondistended.     ASSESSMENT/PLAN Ms. SEJAL COFIELD is a 77 y.o. female with history of COPD, CHF, HTN, HLD, CKD III, CAD s/p stent presenting with sudden onset nausea, vomiting and dizziness.   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  Mild hydrocephalus lateral ventricles.    CTA head no vascular malformation or abnormal enhancement.  Stable cerebellar and IVH.  No LVO.  Intracranial atherosclerosis.  Repeat CT x 3 - Stable volume of intracranial hemorrhage and hydrocephalus  CUS - Significant calcific plaque bilateral bifurcations, however, velocities are not significantly increased.  2D Echo EF 60-65%  EEG negative for seizure  UDS  neg  LDL 66  HgbA1c 5.5  Heparin subq for VTE prophylaxis  aspirin 81 mg daily and Brilinta 90 mg twice daily prior to admission, now on no antithrombotics d/t hmg  Therapy recommendations:  CIR.   Disposition:  Pending. Would likely be a good LTAC candidate if family do not want to pursue palliative care  Palliative consult for long-term care options, meeting scheduled for this afternoon. Family do not want long-term PEG.  Lethargy  On provigil 200 mg daily and amantadine 100 twice daily  Improving   Obstructive hydrocephalus s/p R frontal EVD   EVD placed 8/22, replaced 8/28  CT showed recurrent hydro prior to EVD replacement  Repeat CT stable after EVD replacement  Repeat CT stable 9/5 after 48h of EVD clamping  EVD removed on 04/10/18  NSG signed off  CT repeat 04/12/18 stable mild hydrocephalus  Remove staples from head  MSSA Pneumonia Possible intermittent aspiration. Tracheobronchitis Leukocytosis  T Max  99.3  UA neg for UTI,   Blood culture NNGTD  Leukocytosis  17.4  On home prednisone for COPD  Sputum Cx +MSSA  Started on ancef 9/10 by CCM  D4/7  Repeat CXR left effusion and patchy R base airspace dz felt to be PNA  Carotid stenosis   CUS 08/2017 R ICA stenosis 50-69%, L ICA < 50%  CUS repeat showed b/l ICA 1-39% stenosis but significant calcified plaque at carotid bifurcation bilaterally.   Acute Hypoxic Respiratory failure - extubation and re-intubation S/p Trach placement  Intubated in the ED with neuro decline   CCM on board  Extubated and re-intubated 04/03/18  Trach done 04/11/18  Currently on trach collar, tolerating well  CCM recommends cuffess trach at time of suture removal - likely next week  Dysphagia secondary to stroke  NPO on tube feeds.   Tube feeds changed from bolus to continuous drip d/t difficulty pushing through coretrak tube (small caliber)  Family does not think she/they would want PEG  Hypertensive  Emergency  Treated with cardene in ICU  Home Meds: Norvasc 10, resumed  Currently on norvasc 10 and hydralazine 50 Q8   Add lisinopril 20 mg bid per DR. Orvilla Truett  SBP goal < 180  AKI on CKD  Cre 0.87  On IVF to 50cc  Na 143  Continue monitoring  Hyperlipidemia  Home meds:  ?? Lipitor 80   LDL 66  Hold statin for now. Recheck in 6-8 weeks. If remains low, no statin needed. If elevated, resume statin.   Other Stroke Risk Factors  Advanced age  Former Cigarette smoker, quit 2 years ago  Coronary artery disease s/p stent - on ASA and brilinta PTA  Chronic Congestive heart failure  Anemia.   Essentially stable this admission 7.8->6.9->10->9.   Last documented 11.0 Feb 2018  2u PRBC 9/9  check stool guiac pending   Check Fer 94, VB12 403, lactate 166, haptoglobin 413, Folate 14.8, FE studies 94   Other Active Problems  Hx vertigo   COPD - on prednisone, weaning  underway   Hypokalemia 3.2  ?Vomiting vs spit up - KUB with increased bowel pattern. ABD xray 9/11 withou obstruction.   Hospital day # [place], MSN, APRN, ANVP-BC, AGPCNP-BC Advanced Practice Stroke Nurse [place] for Schedule & Pager information 04/18/2018 8:50 AM   ATTENDING NOTE: I reviewed above note and agree with the assessment and plan. Pt was seen and examined.   Pt seen on first round, drowsy sleepy, not able to open eyes or following commands but able to mouth simple words to first 2-3 questions, seemed appropriate. However, after 10 min with second rounding, pt eyes open, orientated to self, people, able to mouth words in sentences. With dramatic mental status change like this, will order LTM EEG overnight to rule out seizure.   Pt also has SOB, concerning for fluid overload and pulmonary edema, will check CXR, give 40mg  lasix once and change IVF to free water.   Palliative care team talked with daughters at bedside, they would like to watch over the  weekend. I told them we can consider LTAC placement to see how she will do and palliative care can follow up with her over LTAC too. Plan for LTAC transfer next week if pt stable enough.   Rosalin Hawking, MD PhD Stroke Neurology 04/18/2018 6:09 PM  I spent  35 minutes in total face-to-face time with the patient, more than 50% of which was spent in counseling and coordination of care, reviewing test results, images and medication, and discussing the diagnosis of respiratory distress, SOB, drastic mental status changes, pulmonary edema, treatment plan and potential prognosis. This patient's care requiresreview of multiple databases, neurological assessment, discussion with family, other specialists and medical decision making of high complexity.       To contact Stroke Continuity provider, please refer to [email or web address] After hours, contact General Neurology

## 2018-04-18 NOTE — Progress Notes (Signed)
LTM started; Dr Doree Albee notified.

## 2018-04-18 NOTE — Significant Event (Signed)
Rapid Response Event Note RN called for increase lethargy  Overview: Time Called: 9371 Arrival Time: 0958 Event Type: Neurologic  Initial Focused Assessment: ON arrival pt lying supine in bed, responds to sternal rub, unable to stay awake, does not follow commands. Dr. Erlinda Hong reports to [facility] RN this is patients baseline since coming into hospital. Will come by to evaluate. No new orders at this time. RT at bedside suctioning trach. Recent XR suggest possible PNA. No intervention from RRT.    Plan of Care (if not transferred): Continue to monitor and call for any concerns  Event Summary: Name of Physician Notified: Dr. Erlinda Hong  at (PTA RRT )    at    Outcome: Stayed in room and stabalized  Event End Time: [address], [place]

## 2018-04-18 NOTE — Progress Notes (Signed)
PULMONARY / CRITICAL CARE MEDICINE   NAME:  CYNTIA STALEY, MRN:  [id number], DOB:  [birth year], LOS: 37 ADMISSION DATE:  03/27/2018, CONSULTATION DATE:  03/27/2018 REFERRING MD:  Dr. Leonel Ramsay, Neurology, CHIEF COMPLAINT:  Headache  HISTORY OF PRESENT ILLNESS:   77 yo female former smoker presented with HA, N/V and noted to be hypertensive.  CT head showed hematoma in Lt cerebellum with hydrocephalus.  Required intubation for airway protection.  She had EVD by neurosurgery.  Failed extubation attempts on 8/28 and 9/05 due to stridor.  Had tracheostomy done on 9/06.  PAST MEDICAL HISTORY: CAD, CKD 3, Diastolic CHF, HLD  SIGNIFICANT EVENTS: 8/22 Admit, EVD placed 8/26 Fever, add ABx 8/28 replaced EVD (clogged) 8/29 failed extubation (stridor) 9/05 failed extubation (stridor) 9/06 trach 9/08 fever 9/10 transfuse PRBC, increased respiratory secretions >> start ABx 9/11 vomited feels more short of breath   STUDIES:  CT head 8/22 >> 2 cm hematoma Lt cerebellum with extension to 4th ventricle, mild hydrocephalus Echo 8/25 >> mild LVH, EF 60 to 65%, mild AS  CULTURES: Sputum 8/26 >> oral flora Blood 8/26 >> negative CSF 8/29 >> negative Sputum 9/03 >> Candida Blood 9/08 >>  Sputum 9/08 >> Few MSSA  ANTIBIOTICS: Cefepime 8/26 >> 9/01 Vancomycin 8/26 >> 9/01 Ancef 9/10 >>   LINES/TUBES: Trach 9/06 >>   CONSULTANTS: Neurosurgery 8/22 hydrocephalus >> CIR 9/10 >>   SUBJECTIVE: No events overnight, no new complaints  CONSTITUTIONAL: BP (!) 155/62 (BP Location: Left Arm)   Pulse 68   Temp 99.1 F (37.3 C) (Oral)   Resp (!) 25   Ht 5\' 6"  (1.676 m)   Wt 76.3 kg   SpO2 92%   BMI 27.15 kg/m   I/O last 3 completed shifts: In: 4062.4 [I.V.:3382.4; NG/GT:480; IV Piggyback:200] Out: 3200 [Urine:3200]  FiO2 (%):  [35 %] 35 %  PHYSICAL EXAM:  General: Chronically ill appearing female, NAD. HEENT: [place]/AT, PERRL, EOM-I and MMM Pulmonary: Coarse BS diffusely Cardiac: IRIR,  Nl S1/S2 and -M/R/G Extremities: -edema and -tenderness Abdomen: Soft, NT, ND and +BS Neuro: Awake and interactive  I reviewed CXR myself, trach is in a good position  ASSESSMENT/PLAN:  Compromised airway in setting of ICH. Post extubation stridor. S/p tracheostomy. Plan Routine trach care Remove trach sutures Maintain current trach type and size Will likely change to cuffless trach next week  COPD. 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 Plan Cont rehab efforts  Dysphagia. Plan SLP ?PMV Cont tubefeeds  Acute MSSA tracheobronchitis versus pneumonia PCXR personally reviewed small amt left base volume loss.  Does have some right lower lobe airspace disease I worry about possible intermittent aspiration  plan Day 5/7 ancef CXR 9/13   Hypertension. Plan SBP goal <160 Cont norvasc & hydralazine  CKD 2. Plan Trend renal fxn   Anemia. Normocytic/normochromic  -transfused 9/10 -no evidence of bleeding Plan Trend cbc  LABS:  Glucose Recent Labs  Lab 04/17/18 2007 04/17/18 2346 04/18/18 0431 04/18/18 0754 04/18/18 0957 04/18/18 1153  GLUCAP 116* 120* 113* 118* 113* 141*    BMET Recent Labs  Lab 04/15/18 0447 04/16/18 0455 04/17/18 0512  NA 142 141 143  K 3.4* 3.4* 3.2*  CL 111 107 108  CO2 24 25 27   BUN 24* 22 22  CREATININE 0.85 0.80 0.87  GLUCOSE 120* 125* 134*    Liver Enzymes No results for input(s): AST, ALT, ALKPHOS, BILITOT, ALBUMIN in  the last 168 hours.  Electrolytes Recent Labs  Lab 04/15/18 0447 04/16/18 0455 04/17/18 0512  CALCIUM 7.9* 8.3* 8.3*    CBC Recent Labs  Lab 04/15/18 0447 04/15/18 1638 04/16/18 0455 04/17/18 0512  WBC 15.1*  --  16.8* 17.4*  HGB 6.9* 8.1* 10.0* 9.0*  HCT 22.9* 26.9* 32.0* 28.7*  PLT 613*  --  558* 560*    ABG No results for input(s): PHART, PCO2ART, PO2ART in the last 168  hours.  Coag's No results for input(s): APTT, INR in the last 168 hours.  Sepsis Markers No results for input(s): LATICACIDVEN, PROCALCITON, O2SATVEN in the last 168 hours.  Cardiac Enzymes No results for input(s): TROPONINI, PROBNP in the last 168 hours.  Imaging No results found.  Discussed with PCCM-NP.  PCCM will see again on Monday  Rush Farmer, M.D. [facility] Pulmonary/Critical Care Medicine. Pager: [phone]. After hours pager: [phone].

## 2018-04-18 NOTE — Progress Notes (Signed)
RT and RN went into patient's room to change ties on trach.  Lurline Idol is oozing brown secretions and had a slight odor.  RT and RN cleaned site, changed ties, and applied pink foam underneath trach site.  Left patient resting comfortably.

## 2018-04-19 ENCOUNTER — Inpatient Hospital Stay (HOSPITAL_COMMUNITY): Payer: Medicare Other

## 2018-04-19 DIAGNOSIS — J189 Pneumonia, unspecified organism: Secondary | ICD-10-CM

## 2018-04-19 DIAGNOSIS — J9503 Malfunction of tracheostomy stoma: Secondary | ICD-10-CM

## 2018-04-19 DIAGNOSIS — R5081 Fever presenting with conditions classified elsewhere: Secondary | ICD-10-CM

## 2018-04-19 LAB — GLUCOSE, CAPILLARY
GLUCOSE-CAPILLARY: 147 mg/dL — AB (ref 70–99)
Glucose-Capillary: 101 mg/dL — ABNORMAL HIGH (ref 70–99)
Glucose-Capillary: 140 mg/dL — ABNORMAL HIGH (ref 70–99)
Glucose-Capillary: 142 mg/dL — ABNORMAL HIGH (ref 70–99)
Glucose-Capillary: 143 mg/dL — ABNORMAL HIGH (ref 70–99)
Glucose-Capillary: 150 mg/dL — ABNORMAL HIGH (ref 70–99)

## 2018-04-19 LAB — CBC
HCT: 31.7 % — ABNORMAL LOW (ref 36.0–46.0)
Hemoglobin: 9.4 g/dL — ABNORMAL LOW (ref 12.0–15.0)
MCH: 27.2 pg (ref 26.0–34.0)
MCHC: 29.7 g/dL — ABNORMAL LOW (ref 30.0–36.0)
MCV: 91.9 fL (ref 78.0–100.0)
Platelets: 212 10*3/uL (ref 150–400)
RBC: 3.45 MIL/uL — AB (ref 3.87–5.11)
RDW: 15.9 % — AB (ref 11.5–15.5)
WBC: 12.8 10*3/uL — AB (ref 4.0–10.5)

## 2018-04-19 LAB — BASIC METABOLIC PANEL
ANION GAP: 8 (ref 5–15)
BUN: 22 mg/dL (ref 8–23)
CALCIUM: 7.8 mg/dL — AB (ref 8.9–10.3)
CO2: 29 mmol/L (ref 22–32)
Chloride: 104 mmol/L (ref 98–111)
Creatinine, Ser: 1.02 mg/dL — ABNORMAL HIGH (ref 0.44–1.00)
GFR calc Af Amer: >60 mL/min (ref >60)
GFR, EST NON AFRICAN AMERICAN: 52 mL/min — AB (ref >60)
GLUCOSE: 142 mg/dL — AB (ref 70–99)
Potassium: 3.4 mmol/L — ABNORMAL LOW (ref 3.5–5.1)
SODIUM: 141 mmol/L (ref 135–145)

## 2018-04-19 MED ORDER — HYDRALAZINE HCL 50 MG PO TABS
75.0000 mg | ORAL_TABLET | Freq: Three times a day (TID) | ORAL | Status: DC
  Administered 2018-04-19 – 2018-04-22 (×9): 75 mg via ORAL
  Filled 2018-04-19 (×9): qty 1

## 2018-04-19 MED ORDER — PIPERACILLIN-TAZOBACTAM 3.375 G IVPB
3.3750 g | Freq: Three times a day (TID) | INTRAVENOUS | Status: DC
  Administered 2018-04-19 – 2018-04-22 (×10): 3.375 g via INTRAVENOUS
  Filled 2018-04-19 (×12): qty 50

## 2018-04-19 MED ORDER — VANCOMYCIN HCL 10 G IV SOLR
1250.0000 mg | Freq: Once | INTRAVENOUS | Status: AC
  Administered 2018-04-19: 1250 mg via INTRAVENOUS
  Filled 2018-04-19: qty 1250

## 2018-04-19 MED ORDER — VANCOMYCIN HCL IN DEXTROSE 750-5 MG/150ML-% IV SOLN
750.0000 mg | Freq: Two times a day (BID) | INTRAVENOUS | Status: DC
  Administered 2018-04-20 – 2018-04-22 (×5): 750 mg via INTRAVENOUS
  Filled 2018-04-19 (×6): qty 150

## 2018-04-19 NOTE — Progress Notes (Signed)
STROKE TEAM PROGRESS NOTE   INTERVAL HISTORY Daughter is at the bedside. RT is removing sutures and changing tracheal bandage. Pt is responding well, open eyes for provider and wave to greet provider. Pt had fever T 101.3 with foul smell sputum. Culture showed MSSA. Antibiotics changed to vanco/zosyn. LTM EEG no seizure. However, as per daughter, no significant mental status changing episode. Decided to monitor one more day.    Vitals:   04/19/18 0006 04/19/18 0200 04/19/18 0438 04/19/18 0500  BP: (!) 177/96 (!) 147/56 (!) 149/67   Pulse: 86  68   Resp: (!) 27  (!) 26   Temp:      TempSrc:      SpO2: 100%  99%   Weight:    72.9 kg  Height:        CBC:  Recent Labs  Lab 04/17/18 0512 04/18/18 1957  WBC 17.4* 15.4*  HGB 9.0* 10.5*  HCT 28.7* 34.5*  MCV 88.0 90.8  PLT 560* 494*    Basic Metabolic Panel:  Recent Labs  Lab 04/17/18 0512 04/18/18 1957  NA 143 141  K 3.2* 3.6  CL 108 103  CO2 27 25  GLUCOSE 134* 161*  BUN 22 22  CREATININE 0.87 0.93  CALCIUM 8.3* 8.1*   Lipid Panel:     Component Value Date/Time   CHOL 177 03/30/2018 0235   TRIG 67 03/30/2018 2248   HDL 101 03/30/2018 0235   CHOLHDL 1.8 03/30/2018 0235   VLDL 10 03/30/2018 0235   LDLCALC 66 03/30/2018 0235   HgbA1c:  Lab Results  Component Value Date   HGBA1C 5.5 03/30/2018   Urine Drug Screen:     Component Value Date/Time   LABOPIA NONE DETECTED 03/29/2018 1110   COCAINSCRNUR NONE DETECTED 03/29/2018 1110   LABBENZ NONE DETECTED 03/29/2018 1110   AMPHETMU NONE DETECTED 03/29/2018 1110   THCU NONE DETECTED 03/29/2018 1110   LABBARB NONE DETECTED 03/29/2018 1110    Alcohol Level     Component Value Date/Time   ETH <10 03/27/2018 1354    IMAGING Ct Head Wo Contrast 03/27/2018 IMPRESSION:  2 cm hematoma in the deep left cerebellum with fourth ventricular extension extending into the third/lateral ventricles and into the dorsal cervical canal. There is mild hydrocephalus at the  lateral ventricles when compared to 2016. A deep cerebellar origin favors hypertensive etiology.   Ct Angio Head W Or Wo Contrast 03/27/2018 IMPRESSION:  1. No vascular malformation or abnormal enhancement of the brain identified.  2. Stable cerebellar and intraventricular hemorrhage.  3. Patent anterior and posterior intracranial circulation. No large vessel occlusion, aneurysm, or significant stenosis.  4. Calcific atherosclerosis of the carotid and vertebral arteries with mild stenosis.   Ct Head Wo Contrast 03/28/2018 1. Stable volume of intracranial hemorrhage within the left cerebellar hemisphere and the ventricular system. Some redistribution of blood products to the lateral ventricle atria and sylvian fissures.  2. No new acute intracranial abnormality.  3. Interval right frontal approach ventriculostomy catheter placement. Mild decrease in hydrocephalus.  03/30/2018 Stable volume of intraventricular, subarachnoid, and left cerebellar hemorrhage. Stable ventricle size. No new acute intracranial abnormality.  04/01/2018 1. Partial interval dispersion of intraventricular, subarachnoid, and left cerebellar brain parenchymal hemorrhage. 2. Resolution of hydrocephalus. Stable position of right frontal approach ventriculostomy catheter. 3. No new acute intracranial process.  04/02/2018 1. Recurrent lateral and third ventricular hydrocephalus. Newly seen clot along the EVD, presumed cause of catheter dysfunction. 2. No interval bleeding. 3. Increased low-density along  the EVD, likely tracking CSF. 4. No herniation or infarct.  04/10/2018 1. Continued interval evolution of left cerebellar hematoma with slightly improved localized vasogenic edema, with persistent but slightly improved mass effect on the adjacent fourth ventricle. 2. Intraventricular extension with similar small volume intraventricular blood products.  Stable mild hydrocephalus with right frontal ventriculostomy in place. 3. No other  new acute intracranial abnormality.  04/12/2018 1. Interval EVD removal with unchanged mild ventriculomegaly. 2. Unchanged left cerebellar hematoma and surrounding vasogenic edema. 3. Small volume intraventricular hemorrhage without significant change. 4. No evidence of new intracranial abnormality.   CUS 1-39% ICA stenosis.  Vertebral artery flow is antegrade. Significant calcific plaque bilateral bifurcations, however, velocities are not significantly increased.   TTE - Left ventricle: The cavity size was normal. Wall thickness was increased in a pattern of mild LVH. Systolic function was normal. The estimated ejection fraction was in the range of 60% to 65%. Doppler parameters are consistent with both elevated ventricular end-diastolic filling pressure and elevated left atrial filling pressure. - Aortic valve: There was mild stenosis. Valve area (VTI): 1.88 cm^2. Valve area (Vmax): 1.82 cm^2. Valve area (Vmean): 1.59 cm^2. - Mitral valve: Calcified annulus. - Atrial septum: No defect or patent foramen ovale was identified.  Dg Chest Port 1 View  Result Date: 04/18/2018 CLINICAL DATA:  Short of breath EXAM: PORTABLE CHEST 1 VIEW COMPARISON:  04/16/2018 FINDINGS: Tubular devices are stable. Right infrahilar and left basilar consolidation are stable. Low volumes. No pneumothorax. IMPRESSION: Stable bilateral consolidation. Electronically Signed   By: Marybelle Killings M.D.   On: 04/18/2018 18:39     PHYSICAL EXAM General - Well nourished, well developed, on trach collar. Cardiovascular - Regular rate and rhythm. Skin - R FA edema resolved, Skin above trach reddened and sore to touch - unchanged from yesterday -still with redness and edema.  No skin breakdown. Trach collar not tight. Crusting noted. Abd Soft and nondistended.   Core track in place without redness/breakdown. EEG leads in place  Neuro - on trach collar, eyes open on voice, wave right hand to greet provider today. Mouth words  appropriately. Pupils are equal and reactive to light. left gaze preference. Blinking to visual threat on the left but not on the right. Slight Right facial droop. MAE x 4 against gravity BUE 3+/5 and BLE 3/5. babinski negative bilaterally. Sensation intact. Coordination not cooperative and gait not tested.     ASSESSMENT/PLAN Ms. RUWAYDA CURET is a 76 y.o. female with history of COPD, CHF, HTN, HLD, CKD III, CAD s/p stent presenting with sudden onset nausea, vomiting and dizziness.   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  Mild hydrocephalus lateral ventricles.    CTA head no vascular malformation or abnormal enhancement.  Stable cerebellar and IVH.  No LVO.  Intracranial atherosclerosis.  Repeat CT x 3 - Stable volume of intracranial hemorrhage and hydrocephalus  CUS - Significant calcific plaque bilateral bifurcations, however, velocities are not significantly increased.  2D Echo EF 60-65%  EEG negative for seizure  UDS neg  LDL 66  HgbA1c 5.5  Heparin subq for VTE prophylaxis  aspirin 81 mg daily and Brilinta 90 mg twice daily prior to admission, now on no antithrombotics d/t  hmg  Therapy recommendations:  CIR.   Disposition:  Pending. Would likely be a good LTAC candidate if family do not want to pursue palliative care  Palliative consult for long-term care options, meeting scheduled for this afternoon. Family do not want long-term PEG.  Lethargy  On provigil 200 mg daily and amantadine 100 twice daily  Improving, more interactive  However, still has intermittent lethargy and AMS  LTM EEG place to rule out seizure  Will continue for one more  day  Please d/c LTM EEG if no seizure overnight (call EEG tech by 1pm to remove leads)  Obstructive hydrocephalus s/p R frontal EVD   EVD placed 8/22, replaced 8/28  CT showed recurrent hydro prior to EVD replacement  Repeat CT stable after EVD replacement  Repeat CT stable 9/5 after 48h of EVD clamping  EVD removed on 04/10/18  NSG signed off  CT repeat 04/12/18 stable mild hydrocephalus  Removed staples from head  MSSA Pneumonia Possible intermittent aspiration. Tracheobronchitis Leukocytosis  T Max  99.3->101.3  UA neg for UTI,   Blood culture NNGTD  Leukocytosis  17.4->15.4->12.8  On home prednisone for COPD  Sputum Cx +MSSA  Abx changed to vanco/zosyn on 9/14 by CCM  Repeat CXR 9/14 infection over right midlung, worsening interstitial edema  Tracheal aspirate culture sent - pending  Carotid stenosis   CUS 08/2017 R ICA stenosis 50-69%, L ICA < 50%  CUS repeat showed b/l ICA 1-39% stenosis but significant calcified plaque at carotid bifurcation bilaterally.   Acute Hypoxic Respiratory failure - extubation and re-intubation S/p Trach placement  Intubated in the ED with neuro decline   CCM on board  Extubated and re-intubated 04/03/18  Trach done 04/11/18  Currently on trach collar, tolerating well  Trach suture removed today per RT  CCM recommends cuffess trach at time of suture removal  Dysphagia secondary to stroke  NPO on tube feeds.   Tube feeds changed from bolus to continuous drip d/t difficulty pushing through coretrak tube (small caliber)  Family does not think she/they would want PEG  Hypertensive Emergency  BP still at high end  Treated with cardene in ICU  Home Meds: Norvasc 10, resumed  Currently on norvasc 10 and increase hydralazine to 75 Q8   Add lisinopril 20 mg bid   SBP goal < 160  AKI  Cre 0.87->1.02  On free water 33cc/h and TF @ 40cc/h  Na 143->141  Continue monitoring  Hyperlipidemia  Home meds:  ??  Lipitor 80   LDL 66  Hold statin for now. Recheck in 6-8 weeks. If remains low, no statin needed. If elevated, resume statin.   Other Stroke Risk Factors  Advanced age  Former Cigarette smoker, quit 2 years ago  Coronary artery disease s/p stent - on ASA and brilinta PTA  Chronic Congestive heart failure  Anemia, microcytic   Essentially stable this admission 7.8->6.9->10->9.0->10.5->9.4   Last documented 11.0 Feb 2018  2u PRBC 9/9  check stool guiac positive   Iron 130, Fer 94, VB12 403, lactate 166, haptoglobin 413, Folate 14.8, FE studies 94   On iron tab 325mg  bid  Other Active Problems  Hx vertigo   COPD - on prednisone, weaning underway   Hypokalemia 3.2->3.4  Hospital day # 23  I spent  35 minutes in total face-to-face time with the patient, more than 50% of which was spent in counseling and coordination of care, reviewing test results, images and medication, and discussing the diagnosis of respiratory distress,  SOB, drastic mental status changes, pulmonary edema, treatment plan and potential prognosis. This patient's care requiresreview of multiple databases, neurological assessment, discussion with family, other specialists and medical decision making of high complexity.   Rosalin Hawking, MD PhD Stroke Neurology 04/19/2018 9:37 PM   To contact Stroke Continuity provider, please refer to [email or web address] After hours, contact General Neurology

## 2018-04-19 NOTE — Progress Notes (Signed)
Pharmacy Antibiotic Note  Amber [facility] is a 77 y.o. female admitted on 03/27/2018 with pneumonia.  Pharmacy has been consulted for vancomycin and Zosyn dosing.  Upon admission patient was on vancomycin and cefepime that were deescalated to cefazolin. Patient's clinical status is declining with foul smelling sputum and worsening infiltrates in R lung on imaging.    Plan: Vancomycin 1250mg  once as loading dose followed by  Vancomycin 750 IV every 12 hours.  Goal trough 15-20 mcg/mL.  Zosyn 3.375g IV q8h given over 4 hours  Monitor renal function, clinical status, sputum culture, and vanc trough at steady state.    Height: 5\' 6"  (167.6 cm) Weight: 160 lb 11.5 oz (72.9 kg) IBW/kg (Calculated) : 59.3  Temp (24hrs), Avg:100 F (37.8 C), Min:98.2 F (36.8 C), Max:101.3 F (38.5 C)  Recent Labs  Lab 04/15/18 0447 04/16/18 0455 04/17/18 0512 04/18/18 1957 04/19/18 0830  WBC 15.1* 16.8* 17.4* 15.4* 12.8*  CREATININE 0.85 0.80 0.87 0.93 1.02*    Estimated Creatinine Clearance: 48.7 mL/min (A) (by C-G formula based on SCr of 1.02 mg/dL (H)).    No Known Allergies  Antimicrobials this admission: 8/26 Cefepime >> 9/2  8/26 Vancomycin >> 9/2 9/10 Ancef>>9/14 9/14 Vancomycin >> 9/14 Zosyn >>   Microbiology results: 8/25 BCx: negative 8/26 Trach asp:  normal flora 8/23 MRSA PCR: negative 8/29 CSF - NG 9/3 Trach asp - few candida albicans 9/8 BCx - NG 9/8 Trach aspirate -  Gram stain - few GNR, rare gram + coccin in pairs; Culture: few MSSA 9/14 Sputum Cx:  Thank you for allowing pharmacy to be a part of this patient's care.  Azzie Roup D PGY1 Pharmacy Resident  Phone [phone] Please use [facility] for clinical pharmacists numbers  04/19/2018      2:27 PM

## 2018-04-19 NOTE — Procedures (Signed)
LTM-EEG Report  HISTORY: Continuous video-EEG monitoring performed for 77 year old with altered mental status.  ACQUISITION: International 10-20 system for electrode placement; 18 channels with additional eyes linked to ipsilateral ears and EKG. Additional T1-T2 electrodes were used. Continuous video recording obtained.   EEG NUMBER:  MEDICATIONS:  Day 1:  See EMR  DAY #1: from 8675 04/18/18 to 0730 04/19/18   BACKGROUND: An overall low voltage continuous recording with some spontaneous variability and reactivity. Waking background consisted of a low voltage 8 Hz posterior dominant rhythm bilaterally with low voltage beta activity in the bilateral frontocentral regions and frequent low voltage theta-delta activity diffusely. Sleep was captured with normal stage II sleep architecture.  EPILEPTIFORM/PERIODIC ACTIVITY: none SEIZURES: none EVENTS: none reported  EKG: no significant arrhythmia  SUMMARY: This was an abnormal continuous video EEG due to generalized background slowing, indicative of a diffuse cerebral disturbance. There were no seizures or epileptiform discharges.

## 2018-04-19 NOTE — Progress Notes (Signed)
[facility]  S: called back for fever, family notes increasing lethargy this mornign compared to 2-3 days ago; rn reports continued foul smelling sputum   Past Medical History:  Diagnosis Date  . Asthma   . CHF (congestive heart failure) ([place])   . Chronic kidney disease (CKD), active medical management without dialysis, stage 3 (moderate) ([place])   . COPD (chronic obstructive pulmonary disease) ([place])   . Coronary artery disease   . High cholesterol   . Hypertension   . Myocardial infarction ([place]) 04/2015  . Osteoarthritis    "all over" (09/27/2016)  . Pneumonia 1996     O:  Vitals:   04/19/18 0829 04/19/18 0845 04/19/18 1141 04/19/18 1200  BP:    (!) 152/48  Pulse:   77 79  Resp:   (!) 25 (!) 21  Temp:  (!) 101.3 F (38.5 C)  (!) 100.5 F (38.1 C)  TempSrc:  Axillary  Oral  SpO2: 100%  97% 97%  Weight:      Height:       98%  General:  Resting in bed, no increased work of breathing HENT: NCAT OP clear tracheostomy sight with skin breakdown on inferior aspect, surrounding redness, some purulent drainage PULM: Rhonchi bilaterally B, normal effort CV: RRR, no mgr GI: BS+, soft, nontender MSK: normal bulk and tone Neuro: awake, follows some simple commands  CXR: images personally reviewed, RML/RLL infiltrate  CBC    Component Value Date/Time   WBC 12.8 (H) 04/19/2018 0830   RBC 3.45 (L) 04/19/2018 0830   HGB 9.4 (L) 04/19/2018 0830   HCT 31.7 (L) 04/19/2018 0830   PLT 212 04/19/2018 0830   MCV 91.9 04/19/2018 0830   MCH 27.2 04/19/2018 0830   MCHC 29.7 (L) 04/19/2018 0830   RDW 15.9 (H) 04/19/2018 0830   LYMPHSABS 1.5 03/27/2018 1403   MONOABS 0.9 03/27/2018 1403   EOSABS 0.2 03/27/2018 1403   BASOSABS 0.1 03/27/2018 1403     resp culture MSSA  Impression/plan: New fever due to worsening infiltrate R lung, HCAP > sputum culture > vanc per pharm > zosyn per pharm > d/c cefazolin  MSSA tracheobronchitis: > will cover with vanc/zosyn  Skin breakdown  under trach site: > cut sutures > place split sponge between trach holder/skin  Daughter updated bedside  Roselie Awkward, MD [facility] Pager: [phone] Cell: [phone] After 3pm or if no response, call [phone]

## 2018-04-19 NOTE — Progress Notes (Signed)
Notified Dr. Erlinda Hong and Dr. Lake Bells regarding patients temp of 101.5 chest xray ordered.

## 2018-04-19 NOTE — Progress Notes (Signed)
RT came into room, patient was breathing heavily and had heavy secretions coming from trach and from around trach.  RT suctioned the secretions from around trach as well as within.  Patient's BS are very rhonchus but patient did sound better after suction.  Secretions are tan with an odor.  RT applied new pink foam to skin as well as 4x4 gauze near sutured trach and flange.  RT will continue to monitor patient.

## 2018-04-19 NOTE — Progress Notes (Signed)
Removed sutures and send tracheal aspirate per order.

## 2018-04-20 ENCOUNTER — Inpatient Hospital Stay (HOSPITAL_COMMUNITY): Payer: Medicare Other

## 2018-04-20 LAB — GLUCOSE, CAPILLARY
GLUCOSE-CAPILLARY: 122 mg/dL — AB (ref 70–99)
GLUCOSE-CAPILLARY: 140 mg/dL — AB (ref 70–99)
GLUCOSE-CAPILLARY: 140 mg/dL — AB (ref 70–99)
Glucose-Capillary: 160 mg/dL — ABNORMAL HIGH (ref 70–99)
Glucose-Capillary: 129 mg/dL — ABNORMAL HIGH (ref 70–99)

## 2018-04-20 LAB — CBC
HCT: 29.6 % — ABNORMAL LOW (ref 36.0–46.0)
HEMOGLOBIN: 8.8 g/dL — AB (ref 12.0–15.0)
MCH: 27.6 pg (ref 26.0–34.0)
MCHC: 29.7 g/dL — AB (ref 30.0–36.0)
MCV: 92.8 fL (ref 78.0–100.0)
Platelets: 372 10*3/uL (ref 150–400)
RBC: 3.19 MIL/uL — ABNORMAL LOW (ref 3.87–5.11)
RDW: 15.7 % — AB (ref 11.5–15.5)
WBC: 11.9 10*3/uL — ABNORMAL HIGH (ref 4.0–10.5)

## 2018-04-20 LAB — BASIC METABOLIC PANEL
Anion gap: 7 (ref 5–15)
BUN: 22 mg/dL (ref 8–23)
CHLORIDE: 105 mmol/L (ref 98–111)
CO2: 30 mmol/L (ref 22–32)
CREATININE: 1.01 mg/dL — AB (ref 0.44–1.00)
Calcium: 7.8 mg/dL — ABNORMAL LOW (ref 8.9–10.3)
GFR calc Af Amer: >60 mL/min (ref >60)
GFR calc non Af Amer: 53 mL/min — ABNORMAL LOW (ref >60)
GLUCOSE: 133 mg/dL — AB (ref 70–99)
POTASSIUM: 3.3 mmol/L — AB (ref 3.5–5.1)
Sodium: 142 mmol/L (ref 135–145)

## 2018-04-20 MED ORDER — POTASSIUM CHLORIDE 20 MEQ/15ML (10%) PO SOLN
20.0000 meq | Freq: Two times a day (BID) | ORAL | Status: AC
  Administered 2018-04-20 – 2018-04-21 (×4): 20 meq
  Filled 2018-04-20 (×4): qty 15

## 2018-04-20 NOTE — Procedures (Signed)
LTM-EEG Report  HISTORY: Continuous video-EEG monitoring performed for 77 year old with altered mental status.  ACQUISITION: International 10-20 system for electrode placement; 18 channels with additional eyes linked to ipsilateral ears and EKG. Additional T1-T2 electrodes were used. Continuous video recording obtained.   EEG NUMBER:  MEDICATIONS:  Day 2:  See EMR  DAY #2: from 0730 04/19/18 to 0730 04/20/18   BACKGROUND: An overall low voltage continuous recording with some spontaneous variability and reactivity. Waking background consisted of a low voltage 8 Hz posterior dominant rhythm bilaterally with low voltage beta activity in the bilateral frontocentral regions and frequent low voltage theta-delta activity diffusely. Sleep was captured with normal stage II sleep architecture.  EPILEPTIFORM/PERIODIC ACTIVITY: none SEIZURES: none EVENTS: One event of decreased responsiveness at 0400 without EEG correlate.  EKG: no significant arrhythmia  SUMMARY: This was an abnormal continuous video EEG due to generalized background slowing, indicative of a diffuse cerebral disturbance. There were no seizures or epileptiform discharges. One event of decreased responsiveness around 0400 did not appear to be a seizure.

## 2018-04-20 NOTE — Progress Notes (Addendum)
Called by RN for seizure like activity at 4:04 AM. Pushbutton on EEG monitor was activated by RN.   BP (!) 161/68 (BP Location: Left Arm)   Pulse 86   Temp 99 F (37.2 C)   Resp (!) 24   Ht 5\' 6"  (1.676 m)   Wt 76.3 kg   SpO2 96%   BMI 27.15 kg/m   Neurological exam:  Not responding to voice. Will move limbs semipurposefully to stimulation. Not following commands. Eyes closed. Right facial droop. Decreased tone bilateral upper ext. Increased extensor tone LLE. Slightly increased tone RLE.   EEG reviewed prior, during and after pushbutton event. There is diffuse slowing. No electrographic seizure is seen.   A/R: -Event not likely to be a seizure - Posturing described by nursing n addition to worsened mentation relative to Dr. Phoebe Sharps exam on Saturday morning is concerning for possible extension of hemorrhage or new hemorrhage. Will obtain STAT CT head.   Addendum: Preliminary review of CT head shows no significant change from prior CT. New hemorrhage has been ruled out. DDx includes new stroke but not a tPA candidate due to [place] and not a VIR candidate due to high mRS based on Saturday's documented exam, when she was at her new baseline since the left cerebellar ICH with IVH and hydrocephalus.   Repeat CT being ordered for Monday at 6 AM to assess for possible new stroke not visible on current CT.   Electronically signed: Dr. Kerney Elbe

## 2018-04-20 NOTE — Progress Notes (Signed)
Patient taken to STAT head CT per Dr. Yvetta Coder order after he came to assess pt at bedside and reviewed EEG. Dr. Cheral Marker was made aware of potential seizure-like activity consisting of spastic/tonic LLE when RN was performing foley care followed by shaking of RUE followed by LUE the BLE. Spurt of SVT around 0400 also reported to Dr. Cheral Marker.  Pt would not follow commands or respond to stimuli. Pt withdrew from noxious stimuli only with grimace. No eye opening, no verbal response. Pt opened eyes again around 0445 as she was being transported out of the pt room to go to CT, but did not respond verbally or follow commands. After CT of head was complete, pt responded to name by mouthing "what" and when asked if her head hurt, she mouthed "yeah it does". Eye contact maintained with RRT Mindy, RN.  Will continue to monitor pt closely.

## 2018-04-20 NOTE — Progress Notes (Signed)
Notified Neuro team PA regarding patients 10 beat run of SVT. No new orders, will continue to monitor.

## 2018-04-20 NOTE — Progress Notes (Signed)
STROKE TEAM PROGRESS NOTE   INTERVAL HISTORY No family at bedside.   Pt is responding well, open eyes for provider and wave to greet provider, focuses on provider and nods. Pt had fever T 101.3 with foul smell sputum. Culture showed MSSA. Antibiotics changed to vanco/zosyn. LTM EEG no seizure. Had seizure-like event Sat morning 4am, no seizure activity on EEG. Repeat CT stable.   Vitals:   04/20/18 0738 04/20/18 0900 04/20/18 1124 04/20/18 1423  BP:  (!) 152/58 (!) 145/67 (!) 159/62  Pulse: 83 78 82 79  Resp: 19 (!) 23 (!) 26 (!) 21  Temp:  100.1 F (37.8 C) 99.2 F (37.3 C)   TempSrc:  Oral Oral   SpO2: 96% 98% 95% 95%  Weight:      Height:        CBC:  Recent Labs  Lab 04/19/18 0830 04/20/18 0652  WBC 12.8* 11.9*  HGB 9.4* 8.8*  HCT 31.7* 29.6*  MCV 91.9 92.8  PLT 212 297    Basic Metabolic Panel:  Recent Labs  Lab 04/19/18 0830 04/20/18 0652  NA 141 142  K 3.4* 3.3*  CL 104 105  CO2 29 30  GLUCOSE 142* 133*  BUN 22 22  CREATININE 1.02* 1.01*  CALCIUM 7.8* 7.8*   Lipid Panel:     Component Value Date/Time   CHOL 177 03/30/2018 0235   TRIG 67 03/30/2018 2248   HDL 101 03/30/2018 0235   CHOLHDL 1.8 03/30/2018 0235   VLDL 10 03/30/2018 0235   LDLCALC 66 03/30/2018 0235   HgbA1c:  Lab Results  Component Value Date   HGBA1C 5.5 03/30/2018   Urine Drug Screen:     Component Value Date/Time   LABOPIA NONE DETECTED 03/29/2018 1110   COCAINSCRNUR NONE DETECTED 03/29/2018 1110   LABBENZ NONE DETECTED 03/29/2018 1110   AMPHETMU NONE DETECTED 03/29/2018 1110   THCU NONE DETECTED 03/29/2018 1110   LABBARB NONE DETECTED 03/29/2018 1110    Alcohol Level     Component Value Date/Time   ETH <10 03/27/2018 1354    IMAGING   Ct Head Wo Contrast 04/20/2018 IMPRESSION: 1. No acute intracranial process on this motion degraded examination. 2. LEFT cerebellar edema at site of prior hemorrhage without demonstrable blood products. 3. Similar intraventricular  hemorrhage without hydrocephalus.  Dg Chest Port 1 View 04/19/2018 IMPRESSION:  Stable cardiomegaly with slight interval worsening hazy perihilar opacification suggesting interstitial edema. Infection over the right midlung is possible. Possible small amount left pleural fluid. Tubes and lines as described.   Ct Head Wo Contrast 03/27/2018 IMPRESSION:  2 cm hematoma in the deep left cerebellum with fourth ventricular extension extending into the third/lateral ventricles and into the dorsal cervical canal. There is mild hydrocephalus at the lateral ventricles when compared to 2016. A deep cerebellar origin favors hypertensive etiology.   Ct Angio Head W Or Wo Contrast 03/27/2018 IMPRESSION:  1. No vascular malformation or abnormal enhancement of the brain identified.  2. Stable cerebellar and intraventricular hemorrhage.  3. Patent anterior and posterior intracranial circulation. No large vessel occlusion, aneurysm, or significant stenosis.  4. Calcific atherosclerosis of the carotid and vertebral arteries with mild stenosis.   Ct Head Wo Contrast 03/28/2018 1. Stable volume of intracranial hemorrhage within the left cerebellar hemisphere and the ventricular system. Some redistribution of blood products to the lateral ventricle atria and sylvian fissures.  2. No new acute intracranial abnormality.  3. Interval right frontal approach ventriculostomy catheter placement. Mild decrease in hydrocephalus.  03/30/2018 Stable volume of intraventricular, subarachnoid, and left cerebellar hemorrhage. Stable ventricle size. No new acute intracranial abnormality.  04/01/2018 1. Partial interval dispersion of intraventricular, subarachnoid, and left cerebellar brain parenchymal hemorrhage. 2. Resolution of hydrocephalus. Stable position of right frontal approach ventriculostomy catheter. 3. No new acute intracranial process.  04/02/2018 1. Recurrent lateral and third ventricular hydrocephalus. Newly seen  clot along the EVD, presumed cause of catheter dysfunction. 2. No interval bleeding. 3. Increased low-density along the EVD, likely tracking CSF. 4. No herniation or infarct.  04/10/2018 1. Continued interval evolution of left cerebellar hematoma with slightly improved localized vasogenic edema, with persistent but slightly improved mass effect on the adjacent fourth ventricle. 2. Intraventricular extension with similar small volume intraventricular blood products.  Stable mild hydrocephalus with right frontal ventriculostomy in place. 3. No other new acute intracranial abnormality.  04/12/2018 1. Interval EVD removal with unchanged mild ventriculomegaly. 2. Unchanged left cerebellar hematoma and surrounding vasogenic edema. 3. Small volume intraventricular hemorrhage without significant change. 4. No evidence of new intracranial abnormality.   CUS 1-39% ICA stenosis.  Vertebral artery flow is antegrade. Significant calcific plaque bilateral bifurcations, however, velocities are not significantly increased.   TTE - Left ventricle: The cavity size was normal. Wall thickness was increased in a pattern of mild LVH. Systolic function was normal. The estimated ejection fraction was in the range of 60% to 65%. Doppler parameters are consistent with both elevated ventricular end-diastolic filling pressure and elevated left atrial filling pressure. - Aortic valve: There was mild stenosis. Valve area (VTI): 1.88 cm^2. Valve area (Vmax): 1.82 cm^2. Valve area (Vmean): 1.59 cm^2. - Mitral valve: Calcified annulus. - Atrial septum: No defect or patent foramen ovale was identified.   Dg Chest Port 1 View 04/18/2018 IMPRESSION:  Stable bilateral consolidation.    EEG 04/20/2018 SUMMARY: This wasan abnormal continuous video EEG due to generalized background slowing, indicative of a diffuse cerebral disturbance. There were no seizures or epileptiform discharges.One event of decreased responsiveness around 0400  did not appear to be a seizure.    PHYSICAL EXAM General - Well nourished, well developed, on trach collar. Cardiovascular - Regular rate and rhythm. Skin - R FA edema resolved, Skin above trach reddened and sore to touch - unchanged from yesterday -still with redness and edema.  No skin breakdown. Trach collar not tight. Crusting noted. Abd Soft and nondistended.   Core track in place without redness/breakdown. EEG leads in place  Neuro - on trach collar, eyes open on voice, wave right hand to greet provider today. Mouth words appropriately. Pupils are equal and reactive to light. left gaze preference. Blinking to visual threat on the left but not on the right. Slight Right facial droop. MAE x 4 against gravity BUE 3+/5 and BLE 3/5. babinski negative bilaterally. Sensation intact. Coordination not cooperative and gait not tested.     ASSESSMENT/PLAN Amber Rogers is a 77 y.o. female with history of COPD, CHF, HTN, HLD, CKD III, CAD s/p stent presenting with sudden onset nausea, vomiting and dizziness.   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  Mild hydrocephalus lateral ventricles.    CTA head no vascular malformation or abnormal enhancement.  Stable cerebellar and IVH.  No LVO.  Intracranial atherosclerosis.  Repeat CT x 3 - Stable volume of intracranial hemorrhage and hydrocephalus  CUS - Significant calcific plaque bilateral bifurcations, however, velocities are not significantly increased.  2D Echo EF 60-65%  EEG negative for seizure  UDS neg  LDL 66  HgbA1c 5.5  Heparin subq for VTE prophylaxis  aspirin 81 mg daily and Brilinta 90 mg twice daily prior to  admission, now on no antithrombotics d/t hmg  Therapy recommendations:  CIR.   Disposition:  Pending. Would likely be a good LTAC candidate if family do not want to pursue palliative care  Palliative consult for long-term care options, meeting scheduled for this afternoon. Family do not want long-term PEG.  Lethargy  On provigil 200 mg daily and amantadine 100 twice daily  Improving, more interactive  However, still has intermittent lethargy and AMS  LTM EEG place to rule out seizure: slowing  Obstructve hydrocephalus s/p R frontal EVD   EVD placed 8/22, replaced 8/28  CT showed recurrent hydro prior to EVD replacement. Repeat CT head Monday pending.  Repeat CT stable after EVD replacement  Repeat CT stable 9/5 after 48h of EVD clamping  EVD removed on 04/10/18  NSG signed off  CT repeat 04/12/18 and 9/15 stable mild hydrocephalus  Removed staples from head  MSSA Pneumonia Possible intermittent aspiration. Tracheobronchitis Leukocytosis  T Max  99.3->101.3 ->100.1->99.2  UA neg for UTI,   Blood culture NNGTD  Leukocytosis  17.4->15.4->12.8  On home prednisone for COPD  Sputum Cx +MSSA  Abx changed to vanco/zosyn on 9/14 by CCM  Repeat CXR 9/14 infection over right midlung, worsening interstitial edema  Tracheal aspirate culture sent - pending  Carotid stenosis   CUS 08/2017 R ICA stenosis 50-69%, L ICA < 50%  CUS repeat showed b/l ICA 1-39% stenosis but significant calcified plaque at carotid bifurcation bilaterally.   Acute Hypoxic Respiratory failure - extubation and re-intubation S/p Trach placement  Intubated in the ED with neuro decline   CCM on board  Extubated and re-intubated 04/03/18  Trach done 04/11/18  Currently on trach collar, tolerating well  Trach suture removed today per RT  CCM recommends cuffess trach at time of suture removal  Dysphagia secondary to stroke  NPO on tube feeds.   Tube feeds changed from bolus to  continuous drip d/t difficulty pushing through coretrak tube (small caliber)  Family does not think she/they would want PEG  Hypertensive Emergency  BP still at high end  Treated with cardene in ICU  Home Meds: Norvasc 10, resumed  Currently on norvasc 10 and increase hydralazine to 75 Q8   Add lisinopril 20 mg bid   SBP goal < 160  AKI  Cre 0.87->1.02  On free water 33cc/h and TF @ 40cc/h  Na 143->141  Continue monitoring  Hyperlipidemia  Home meds:  ?? Lipitor 80   LDL 66  Hold statin for now. Recheck in 6-8 weeks. If remains low, no statin needed. If elevated, resume statin.   Other Stroke Risk Factors  Advanced age  Former Cigarette smoker, quit 2 years ago  Coronary artery disease s/p stent - on ASA and brilinta PTA  Chronic Congestive heart failure  Anemia, microcytic   Essentially stable this admission 7.8->6.9->10->9.0->10.5->9.4 -> 8.8  Last documented 11.0 Feb 2018  2u PRBC 9/9  check stool guiac positive   Iron 130, Fer 94, VB12 403, lactate 166, haptoglobin 413, Folate 14.8, FE studies 94   On iron tab 325mg  bid  Other Active Problems  Hx vertigo   COPD - on prednisone, weaning underway   Hypokalemia 3.2->3.4->3.3 - supplement  Hospital day # 24   Personally examined patient and images, and have participated in and made any corrections needed to history, physical, neuro exam,assessment and plan as stated above.  I have personally obtained the history, evaluated lab date, reviewed imaging studies and agree with radiology interpretations.    Sarina Ill, MD Stroke Neurology  Electronically signed: Dr. Kerney Elbe  To contact Stroke Continuity provider, please refer to [email or web address] After hours, contact General Neurology

## 2018-04-20 NOTE — Progress Notes (Signed)
Dr. Cheral Marker text paged for potential seizure activity- EEG button pushed at [place] [facility] [place]. Dr. Cheral Marker at bedside to review EEG and examine patient.

## 2018-04-21 ENCOUNTER — Inpatient Hospital Stay (HOSPITAL_COMMUNITY): Payer: Medicare Other

## 2018-04-21 LAB — BASIC METABOLIC PANEL
Anion gap: 14 (ref 5–15)
BUN: 25 mg/dL — AB (ref 8–23)
CALCIUM: 8.1 mg/dL — AB (ref 8.9–10.3)
CO2: 27 mmol/L (ref 22–32)
Chloride: 102 mmol/L (ref 98–111)
Creatinine, Ser: 1.11 mg/dL — ABNORMAL HIGH (ref 0.44–1.00)
GFR calc Af Amer: 55 mL/min — ABNORMAL LOW (ref >60)
GFR calc non Af Amer: 47 mL/min — ABNORMAL LOW (ref >60)
Glucose, Bld: 183 mg/dL — ABNORMAL HIGH (ref 70–99)
Potassium: 3.7 mmol/L (ref 3.5–5.1)
SODIUM: 143 mmol/L (ref 135–145)

## 2018-04-21 LAB — CULTURE, RESPIRATORY

## 2018-04-21 LAB — CBC
HCT: 30.7 % — ABNORMAL LOW (ref 36.0–46.0)
Hemoglobin: 9 g/dL — ABNORMAL LOW (ref 12.0–15.0)
MCH: 27.4 pg (ref 26.0–34.0)
MCHC: 29.3 g/dL — AB (ref 30.0–36.0)
MCV: 93.3 fL (ref 78.0–100.0)
Platelets: 400 10*3/uL (ref 150–400)
RBC: 3.29 MIL/uL — ABNORMAL LOW (ref 3.87–5.11)
RDW: 15.9 % — AB (ref 11.5–15.5)
WBC: 15 10*3/uL — ABNORMAL HIGH (ref 4.0–10.5)

## 2018-04-21 LAB — GLUCOSE, CAPILLARY
GLUCOSE-CAPILLARY: 142 mg/dL — AB (ref 70–99)
GLUCOSE-CAPILLARY: 149 mg/dL — AB (ref 70–99)
GLUCOSE-CAPILLARY: 150 mg/dL — AB (ref 70–99)
Glucose-Capillary: 132 mg/dL — ABNORMAL HIGH (ref 70–99)
Glucose-Capillary: 159 mg/dL — ABNORMAL HIGH (ref 70–99)
Glucose-Capillary: 147 mg/dL — ABNORMAL HIGH (ref 70–99)
Glucose-Capillary: 178 mg/dL — ABNORMAL HIGH (ref 70–99)
Glucose-Capillary: 161 mg/dL — ABNORMAL HIGH (ref 70–99)

## 2018-04-21 LAB — CULTURE, RESPIRATORY W GRAM STAIN

## 2018-04-21 MED ORDER — MORPHINE SULFATE (PF) 2 MG/ML IV SOLN
1.0000 mg | Freq: Once | INTRAVENOUS | Status: AC
  Administered 2018-04-21: 1 mg via INTRAVENOUS
  Filled 2018-04-21: qty 1

## 2018-04-21 MED ORDER — MORPHINE SULFATE (PF) 2 MG/ML IV SOLN
1.0000 mg | INTRAVENOUS | Status: DC | PRN
  Administered 2018-04-21 – 2018-04-22 (×2): 1 mg via INTRAVENOUS
  Filled 2018-04-21 (×2): qty 1

## 2018-04-21 MED ORDER — FREE WATER
100.0000 mL | Freq: Four times a day (QID) | Status: DC
  Administered 2018-04-21 – 2018-04-22 (×5): 100 mL

## 2018-04-21 MED ORDER — ACETYLCYSTEINE 20 % IN SOLN
3.0000 mL | Freq: Two times a day (BID) | RESPIRATORY_TRACT | Status: DC
  Administered 2018-04-21 – 2018-04-22 (×3): 3 mL via RESPIRATORY_TRACT
  Filled 2018-04-21 (×5): qty 4

## 2018-04-21 MED ORDER — STROKE: EARLY STAGES OF RECOVERY BOOK: Freq: Once | Status: DC

## 2018-04-21 MED ORDER — GLYCOPYRROLATE 0.2 MG/ML IJ SOLN
0.2000 mg | INTRAMUSCULAR | Status: DC | PRN
  Administered 2018-04-21 (×2): 0.2 mg via INTRAVENOUS
  Filled 2018-04-21 (×2): qty 1

## 2018-04-21 MED ORDER — WHITE PETROLATUM EX OINT
TOPICAL_OINTMENT | CUTANEOUS | Status: AC
  Administered 2018-04-21: 0.2
  Filled 2018-04-21: qty 28.35

## 2018-04-21 MED ORDER — FUROSEMIDE 10 MG/ML IJ SOLN
40.0000 mg | Freq: Two times a day (BID) | INTRAMUSCULAR | Status: DC
  Administered 2018-04-21 – 2018-04-22 (×3): 40 mg via INTRAVENOUS
  Filled 2018-04-21 (×3): qty 4

## 2018-04-21 NOTE — Progress Notes (Addendum)
Discussed case with CCM. Lasix 40 mg will be administered and a CCM team member will see her now given continued air hunger, agitation most likely secondary to such and respirations continuing to be in the 33-34 range despite suctioning and 80% O2.   Of note, CXR showed shallow inspiration, cardiac enlargement, small bilateral pleural effusions and perihilare infiltrates likely due to edema. Unclear if there is significant change from prior CXR on 9/14. Echocardiogram on 8/25 showed good EF.   Addendum 6:53 AM: Respirations improved to mid 20's per CCM. Family is leaning towards comfort care and they are traveling to [facility] to visit the patient and discuss further.   Electronically signed: Dr. Kerney Elbe

## 2018-04-21 NOTE — Progress Notes (Addendum)
STROKE TEAM PROGRESS NOTE   INTERVAL HISTORY Family at bedside. Pt with respiratory distress during the night, stable now. Awake. Family concerned about long-term goals. Dr. Leonie Man discussed goals and plans with entire family and pt. Pt told family she did not want PEG or SNF but told Dr. Leonie Man she did. Feel pt unreliable. Will defer to dtrs for decision making.   Vitals:   04/21/18 0603 04/21/18 0731 04/21/18 0735 04/21/18 0736  BP: (!) 155/55   (!) 164/65  Pulse: 73 82  84  Resp: (!) 33 (!) 26  (!) 29  Temp:    99 F (37.2 C)  TempSrc:    Axillary  SpO2: 97% 95% 99% 96%  Weight:      Height:        CBC:  Recent Labs  Lab 04/20/18 0652 04/21/18 0513  WBC 11.9* 15.0*  HGB 8.8* 9.0*  HCT 29.6* 30.7*  MCV 92.8 93.3  PLT 372 263    Basic Metabolic Panel:  Recent Labs  Lab 04/20/18 0652 04/21/18 0513  NA 142 143  K 3.3* 3.7  CL 105 102  CO2 30 27  GLUCOSE 133* 183*  BUN 22 25*  CREATININE 1.01* 1.11*  CALCIUM 7.8* 8.1*   IMAGING Ct Angio Head W Or Wo Contrast  Result Date: 03/27/2018 CLINICAL DATA:  77 y/o  F; nausea, vomiting, dizziness. EXAM: CT ANGIOGRAPHY HEAD TECHNIQUE: Multidetector CT imaging of the head was performed using the standard protocol during bolus administration of intravenous contrast. Multiplanar CT image reconstructions and MIPs were obtained to evaluate the vascular anatomy. CONTRAST:  52mL ISOVUE-370 IOPAMIDOL (ISOVUE-370) INJECTION 76% COMPARISON:  03/27/2018 CT head. FINDINGS: CTA HEAD Anterior circulation: No significant stenosis, proximal occlusion, aneurysm, or vascular malformation. Calcified plaque of the carotid siphons with mild bilateral distal cavernous stenosis. Posterior circulation: No significant stenosis, proximal occlusion, aneurysm, or vascular malformation. Calcified plaque of the vertebral arteries with mild stenosis downstream to the PICA origins. Venous sinuses: As permitted by contrast timing, patent. Anatomic variants: Patent  anterior communicating artery. No posterior communicating artery identified, likely hypoplastic or absent. Delayed phase: No abnormal intracranial enhancement. IMPRESSION: 1. No vascular malformation or abnormal enhancement of the brain identified. 2. Stable cerebellar and intraventricular hemorrhage. 3. Patent anterior and posterior intracranial circulation. No large vessel occlusion, aneurysm, or significant stenosis. 4. Calcific atherosclerosis of the carotid and vertebral arteries with mild stenosis. Electronically Signed   By: Kristine Garbe M.D.   On: 03/27/2018 17:52   Ct Head Wo Contrast  Result Date: 04/20/2018 CLINICAL DATA:  Altered mental status, assess for hemorrhage. EXAM: CT HEAD WITHOUT CONTRAST TECHNIQUE: Contiguous axial images were obtained from the base of the skull through the vertex without intravenous contrast. COMPARISON:  CT HEAD April 12, 2017 FINDINGS: Moderately motion degraded examination further limited by streak artifact from EEG leads, image quality somewhat improved on repeat examination. BRAIN: Evolving LEFT cerebellar infarct, less conspicuous blood products, potentially obscured by motion. Mild regional mass effect without midline shift. Old small RIGHT cerebellar infarct. Probable basal ganglia lacunar infarcts. Similar dependent blood products bilateral occipital horns. Mild ventriculomegaly in the base of global parenchymal brain volume loss, improved from prior examination. No hydrocephalus. Patchy to confluent supratentorial white matter hypodensities. No acute large vascular territory infarcts. No abnormal extra-axial fluid collections. VASCULAR: Moderate to severe calcific atherosclerosis of the carotid siphons. SKULL: No skull fracture. RIGHT frontal burr hole. Small RIGHT frontal exostosis. No significant scalp soft tissue swelling. SINUSES/ORBITS: Trace paranasal sinus mucosal thickening.  Nasogastric tube via LEFT nares. RIGHT mastoid effusion. LEFT  middle ear and mastoid effusion. Included ocular globes and orbital contents are non-suspicious. OTHER: Poor dentition with multiple dental caries and periapical abscess. IMPRESSION: 1. No acute intracranial process on this motion degraded examination. 2. LEFT cerebellar edema at site of prior hemorrhage without demonstrable blood products. 3. Similar intraventricular hemorrhage without hydrocephalus. Electronically Signed   By: Elon Alas M.D.   On: 04/20/2018 05:45   Ct Head Wo Contrast  Result Date: 04/12/2018 CLINICAL DATA:  Follow-up intracranial hemorrhage. Interval removal of EVD. EXAM: CT HEAD WITHOUT CONTRAST TECHNIQUE: Contiguous axial images were obtained from the base of the skull through the vertex without intravenous contrast. COMPARISON:  04/10/2018 FINDINGS: The study is mildly motion degraded. Brain: The ventriculostomy catheter has been removed in the interim with minimal blood again noted along the prior catheter tract in the right frontal lobe. A small locule of gas is noted in the temporal horn of the right lateral ventricle. Small volume blood in the occipital horns of the lateral ventricles has decreased on the right and has slightly increased on the left reflecting some interval redistribution without significant change in overall volume. Left cerebellar hematoma demonstrates stable to slightly decreased density and no significant change in size. Surrounding vasogenic edema is also unchanged with partial effacement of the fourth ventricle. Mild lateral and third ventriculomegaly is unchanged. There is no midline shift or extra-axial fluid collection. No acute large territory infarct or new intracranial hemorrhage is identified. Patchy cerebral white matter hypodensities are unchanged and nonspecific but compatible with moderate chronic small vessel ischemic disease. A small chronic infarct is again noted in the right cerebellum. Vascular: Calcified atherosclerosis at the skull base.  No hyperdense vessel. Skull: Right frontal burr hole with adjacent skin staples remaining in place. Small right lateral skull osteoma. Sinuses/Orbits: Grossly unremarkable orbits within limitations of motion. No evidence of acute inflammatory paranasal sinus disease. Similar appearance of small bilateral mastoid effusions. Other: None. IMPRESSION: 1. Interval EVD removal with unchanged mild ventriculomegaly. 2. Unchanged left cerebellar hematoma and surrounding vasogenic edema. 3. Small volume intraventricular hemorrhage without significant change. 4. No evidence of new intracranial abnormality. Electronically Signed   By: Logan Bores M.D.   On: 04/12/2018 19:22   Ct Head Wo Contrast  Result Date: 04/10/2018 CLINICAL DATA:  Follow-up examination for a communicating hydrocephalus. EXAM: CT HEAD WITHOUT CONTRAST TECHNIQUE: Contiguous axial images were obtained from the base of the skull through the vertex without intravenous contrast. COMPARISON:  Prior CT from 04/08/2018 FINDINGS: Brain: Ventriculomegaly overall not significantly changed relative to previous exam. Degenerating blood products seen layering within the occipital horns of the lateral ventricles. Right frontal approach ventriculostomy remains in place with tip near the foramen of Monro. Small amount of blood products along the ventricular tract with surrounding edema. Left cerebellar hematoma continues to evolve, slightly less prominent as compared to previous. Persistent surrounding vasogenic edema with mass effect and partial effacement on the adjacent fourth ventricle, slightly improved. Stable atrophy with chronic small vessel ischemic disease. No acute large vessel territory infarct. Chronic right cerebellar infarct noted. No extra-axial fluid collection. Vascular: No hyperdense vessel. Calcified atherosclerosis at the skull base. Skull: Right frontal burr hole with overlying skin staples. Scalp soft tissues and calvarium demonstrate no acute new  abnormality. Sinuses/Orbits: Globes and orbital soft tissues demonstrate no acute finding. Mild mucosal thickening within the maxillary sinuses. Bilateral mastoid effusions are similar to previous. Other: Multiple dental caries noted. IMPRESSION: 1.  Continued interval evolution of left cerebellar hematoma with slightly improved localized vasogenic edema, with persistent but slightly improved mass effect on the adjacent fourth ventricle. 2. Intraventricular extension with similar small volume intraventricular blood products. Stable mild hydrocephalus with right frontal ventriculostomy in place. 3. No other new acute intracranial abnormality. Electronically Signed   By: Jeannine Boga M.D.   On: 04/10/2018 05:49   Ct Head Wo Contrast  Result Date: 04/08/2018 CLINICAL DATA:  Follow up intracranial hemorrhage. Decreased ventriculostomy output. EXAM: CT HEAD WITHOUT CONTRAST TECHNIQUE: Contiguous axial images were obtained from the base of the skull through the vertex without intravenous contrast. COMPARISON:  CT HEAD April 02, 2018 FINDINGS: BRAIN: Stable ventriculomegaly with blood products within the lateral ventricles, layering in the occipital horns and, blood products and fourth ventricle. Similar blood products along the RIGHT frontal ventriculostomy catheter with surrounding vasogenic edema. Patchy white matter supratentorial hypodensities close of aforementioned abnormality compatible with mild chronic small vessel ischemic changes. Minimal likely redistributed subarachnoid hemorrhage. No acute large vascular territory infarct. Evolving LEFT cerebellar hematoma with surrounding low-density vasogenic edema and effacement of the fourth ventricle. Old small RIGHT cerebellar infarct. VASCULAR: Moderate calcific atherosclerosis of the carotid siphons. SKULL: No skull fracture. RIGHT frontal oral hold with mild scalp soft tissue swelling and skin staples. No significant scalp soft tissue swelling.  SINUSES/ORBITS: Bilateral mastoid effusions. LEFT maxillary mucosal retention cysts with trace paranasal sinus mucosal thickening. Included ocular globes and orbital contents are non-suspicious. OTHER: Endotracheal tube in place.  Multiple dental caries. IMPRESSION: 1. Evolving LEFT cerebellar hematoma with intraventricular extension. 2. Stable mild hydrocephalus with RIGHT frontal ventriculostomy catheter, similar blood products along catheter track. 3. Small volume redistributed subarachnoid hemorrhage. Electronically Signed   By: Elon Alas M.D.   On: 04/08/2018 06:00   Ct Head Wo Contrast  Result Date: 04/02/2018 CLINICAL DATA:  Postop unresponsiveness. EXAM: CT HEAD WITHOUT CONTRAST TECHNIQUE: Contiguous axial images were obtained from the base of the skull through the vertex without intravenous contrast. COMPARISON:  Earlier today FINDINGS: Brain: Recurrent lateral and third ventricular hydrocephalus with ballooning of the horns. Clot is newly seen along the EVD catheter, which is presumably dysfunctional. There is also increased edema around the catheter which is likely tracking CSF. No rebleeding is seen. There is stable left cerebellar, intraventricular, and subarachnoid clot. Negative for infarct. Vascular: Negative Skull: Unremarkable burr hole for EVD.  Right parietal osteoma. Sinuses/Orbits: Negative Other: During reading, case discussed with RN Roselyn Reef and findings were expected. She is currently calling Dr Kathyrn Sheriff for orders. IMPRESSION: 1. Recurrent lateral and third ventricular hydrocephalus. Newly seen clot along the EVD, presumed cause of catheter dysfunction. 2. No interval bleeding. 3. Increased low-density along the EVD, likely tracking CSF. 4. No herniation or infarct. Electronically Signed   By: Monte Fantasia M.D.   On: 04/02/2018 13:14   Ct Head Wo Contrast  Result Date: 04/01/2018 CLINICAL DATA:  77 y/o  F; follow-up of intracranial hemorrhage. EXAM: CT HEAD WITHOUT  CONTRAST TECHNIQUE: Contiguous axial images were obtained from the base of the skull through the vertex without intravenous contrast. COMPARISON:  03/30/2018 CT head.  09/24/2014 CT head. FINDINGS: Brain: Partial interval dispersion of intraventricular, subarachnoid, and left cerebellar brain parenchymal hemorrhage. No new acute intracranial hemorrhage, stroke, or focal mass effect. Decreased size of the lateral and third ventricles, ventricle size now similar to 2016 CT head. Stable position of right frontal approach ventriculostomy catheter with tip in the right lateral ventricle near the foramen of  Monro. Vascular: Calcific atherosclerosis of carotid siphons and vertebral arteries. No hyperdense vessel identified. Skull: Right frontal burr hole postsurgical changes. Torus palatini and maxillaris. Sinuses/Orbits: Endotracheal and enteric tubes. Left maxillary sinus small mucous retention cyst. Visualized paranasal sinuses and mastoid air cells are otherwise normally aerated. Orbits are unremarkable. Other: None. IMPRESSION: 1. Partial interval dispersion of intraventricular, subarachnoid, and left cerebellar brain parenchymal hemorrhage. 2. Resolution of hydrocephalus. Stable position of right frontal approach ventriculostomy catheter. 3. No new acute intracranial process. Electronically Signed   By: Kristine Garbe M.D.   On: 04/01/2018 05:26   Ct Head Wo Contrast  Result Date: 03/30/2018 CLINICAL DATA:  77 y/o F; follow-up for intracranial hemorrhage, intraventricular hemorrhage, and hydrocephalus. EXAM: CT HEAD WITHOUT CONTRAST TECHNIQUE: Contiguous axial images were obtained from the base of the skull through the vertex without intravenous contrast. COMPARISON:  03/28/2018 and 03/27/2018 CT head. FINDINGS: Brain: Stable volume acute hemorrhage within the left medial cerebellar hemisphere, ventricular system, and subarachnoid spaces of the sylvian fissures. There is some interval redistribution,  for example decreased hemorrhage within the frontal horns of lateral ventricles, and increased pooling in the occipital horns. No new acute intracranial hemorrhage, stroke, or mass effect. Right frontal approach ventriculostomy catheter is stable in position traversing the frontal horn of right lateral ventricle. Stable ventricle size. Vascular: Calcific atherosclerosis of carotid siphons and vertebral arteries. Skull: Postsurgical changes related to a right frontal approach ventriculostomy catheter with decreased edema in the scalp. Sinuses/Orbits: Intubated patient. Maxillary sinus mucous retention cysts. Normal aeration of mastoid air cells. Orbits are unremarkable. Other: None. IMPRESSION: Stable volume of intraventricular, subarachnoid, and left cerebellar hemorrhage. Stable ventricle size. No new acute intracranial abnormality. Electronically Signed   By: Kristine Garbe M.D.   On: 03/30/2018 05:12   Ct Head Wo Contrast  Result Date: 03/28/2018 CLINICAL DATA:  78 y/o F; left cerebellar and interventricular hemorrhage with mild hydrocephalus for follow-up. EXAM: CT HEAD WITHOUT CONTRAST TECHNIQUE: Contiguous axial images were obtained from the base of the skull through the vertex without intravenous contrast. COMPARISON:  03/27/2018 CT head and CTA head. FINDINGS: Brain: Stable volume of intracranial hemorrhage within the left cerebellar hemisphere and the ventricular system. Some interval redistribution of hemorrhage pooling in the occipital horns of lateral ventricles and within the subarachnoid space of the sylvian fissures. No new acute hemorrhage, mass effect, or herniation. Interval placement of a right frontal approach ventriculostomy catheter into the frontal horn of right lateral ventricle with tip near the foramen of Monro. Mild decrease in size of the lateral and third ventricles. Vascular: No hyperdense vessel or unexpected calcification. Skull: Postsurgical changes related to a right  frontal craniotomy mild air and edema in the overlying scalp. Sinuses/Orbits: Intubated patient. No acute abnormality of the paranasal sinuses or orbits. Other: None. IMPRESSION: 1. Stable volume of intracranial hemorrhage within the left cerebellar hemisphere and the ventricular system. Some redistribution of blood products to the lateral ventricle atria and sylvian fissures. 2. No new acute intracranial abnormality. 3. Interval right frontal approach ventriculostomy catheter placement. Mild decrease in hydrocephalus. Electronically Signed   By: Kristine Garbe M.D.   On: 03/28/2018 20:56   Ct Head Wo Contrast  Result Date: 03/27/2018 CLINICAL DATA:  Sudden onset nausea and vomiting EXAM: CT HEAD WITHOUT CONTRAST TECHNIQUE: Contiguous axial images were obtained from the base of the skull through the vertex without intravenous contrast. COMPARISON:  04/16/2015 FINDINGS: Brain: 20 mm hematoma in the central left cerebellum with extension into the fourth  ventricle sella projecting LVAD remote of Luschka continuing superiorly through the cerebral aqua duct and into the third and lateral ventricles. There is lateral ventriculomegaly when compared to 2016. Periventricular low-density about the atria could be small-vessel disease and/or transependymal CSF flow when compared with prior. 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 Vanita Panda was stabilizing a patient. The findings were already known to him when we reviewed the images by telephone at 2:45 p.m. 03/27/2018. IMPRESSION: 2 cm hematoma in the deep left cerebellum with fourth ventricular extension extending into the third/lateral ventricles and into the dorsal cervical canal. There is mild hydrocephalus at the lateral ventricles when compared to 2016. A deep cerebellar origin favors hypertensive etiology. Electronically Signed   By:  Monte Fantasia M.D.   On: 03/27/2018 14:49   Dg Chest Port 1 View  Result Date: 04/21/2018 CLINICAL DATA:  Shortness of breath EXAM: PORTABLE CHEST 1 VIEW COMPARISON:  04/19/2018 FINDINGS: Tracheostomy. Right PICC line with tip over the low SVC region. No pneumothorax. Enteric tube tip is off the field of view but below the left hemidiaphragm. Shallow inspiration. Cardiac enlargement with perihilar infiltrates likely due to edema. Small bilateral pleural effusions. Degenerative changes in the spine and shoulders. IMPRESSION: Appliances appear in satisfactory position. Cardiac enlargement with perihilar edema and small bilateral pleural effusions. Electronically Signed   By: Lucienne Capers M.D.   On: 04/21/2018 04:28   Dg Chest Port 1 View  Result Date: 04/19/2018 CLINICAL DATA:  Fever.  Tracheostomy. EXAM: PORTABLE CHEST 1 VIEW COMPARISON:  04/18/2018 FINDINGS: Enteric tube present as tip not visualized. Tracheostomy tube unchanged. Right-sided PICC line unchanged with tip over the SVC. Lungs are somewhat hypoinflated demonstrate the slight interval worsening hazy perihilar opacification likely interstitial edema. Infection over the right midlung is possible. Possible small amount left pleural fluid. Stable cardiomegaly. Remainder of the exam is unchanged. IMPRESSION: Stable cardiomegaly with slight interval worsening hazy perihilar opacification suggesting interstitial edema. Infection over the right midlung is possible. Possible small amount left pleural fluid. Tubes and lines as described. Electronically Signed   By: Marin Olp M.D.   On: 04/19/2018 15:33   Dg Chest Port 1 View  Result Date: 04/18/2018 CLINICAL DATA:  Short of breath EXAM: PORTABLE CHEST 1 VIEW COMPARISON:  04/16/2018 FINDINGS: Tubular devices are stable. Right infrahilar and left basilar consolidation are stable. Low volumes. No pneumothorax. IMPRESSION: Stable bilateral consolidation. Electronically Signed   By: Marybelle Killings  M.D.   On: 04/18/2018 18:39   Dg Chest Port 1 View  Result Date: 04/16/2018 CLINICAL DATA:  Aspiration pneumonia and vomiting. EXAM: PORTABLE CHEST 1 VIEW COMPARISON:  April 16, 2018 7:06 a.m. FINDINGS: The heart size and mediastinal contours are stable. The heart size is enlarged. Tracheostomy tube is unchanged. Right central venous line is unchanged. At the upper of tube is identified distal tip not included on film. Patchy consolidation of the left lung base and right infrahilar region are noted. There is mild interstitial edema. The visualized skeletal structures are stable. IMPRESSION: Patchy opacity of the left lung base and right infrahilar region suspicious for pneumonia. Mild interstitial edema. Electronically Signed   By: Abelardo Diesel M.D.   On: 04/16/2018 15:30   Dg Chest Port 1 View  Result Date: 04/16/2018 CLINICAL DATA:  Shortness of breath EXAM: PORTABLE CHEST 1 VIEW COMPARISON:  April 13, 2018 FINDINGS: Tracheostomy catheter tip is 4.6 cm above the  carina. Feeding tube tip is below the diaphragm. Central catheter tip is in the superior vena cava. No pneumothorax. There is patchy airspace opacity in the right lower lobe. There is a small left pleural effusion with medial left base atelectasis. Heart is upper normal in size with pulmonary vascularity normal. No adenopathy. No bone lesions. IMPRESSION: Tube and catheter positions as described without pneumothorax. Patchy airspace opacity, likely pneumonia, right base. Atelectasis medial left base with small left pleural effusion. Stable cardiac prominence. Electronically Signed   By: Lowella Grip III M.D.   On: 04/16/2018 10:03   Dg Chest Port 1 View  Result Date: 04/13/2018 CLINICAL DATA:  Fever EXAM: PORTABLE CHEST 1 VIEW COMPARISON:  April 11, 2018 FINDINGS: The feeding tube terminates below today's film. Stable tracheostomy tube. Stable right PICC line in good position. Stable cardiomegaly. Small effusion and atelectasis  in the left base. No other interval changes. IMPRESSION: Support apparatus as above. Tiny left effusion with associated atelectasis. No other change. Electronically Signed   By: Dorise Bullion III M.D   On: 04/13/2018 12:20   Dg Chest Port 1 View  Result Date: 04/11/2018 CLINICAL DATA:  Tracheostomy. EXAM: PORTABLE CHEST 1 VIEW COMPARISON:  04/10/2018. FINDINGS: Tracheostomy tube noted with tip over the trachea. Right PICC line stable position. Cardiomegaly. Bibasilar atelectasis. Bibasilar infiltrates/edema. Small left pleural effusion. IMPRESSION: 1. Tracheostomy tube in od anatomic position. Right PICC line noted with tip in stable position. 2.  Stable cardiomegaly. 3. Bibasilar atelectasis. Bibasilar infiltrates/edema. Small left pleural effusion. No pneumothorax. Electronically Signed   By: Marcello Moores  Register   On: 04/11/2018 10:38   Dg Chest Port 1 View  Result Date: 04/10/2018 CLINICAL DATA:  Intubation. EXAM: PORTABLE CHEST 1 VIEW COMPARISON:  Chest radiograph April 09, 2018 FINDINGS: Endotracheal tube tip projects 7.9 cm above the carina, above the level of the clavicle. Stable RIGHT PICC distal tip projecting in distal superior vena cava. Interval removal of nasogastric tube. Low inspiratory examination with small pleural effusions and bibasilar strandy densities. Mild cardiomegaly. Calcified aortic arch. No pneumothorax. Soft tissue planes and included osseous structures are non suspicious. IMPRESSION: Endotracheal tube tip projects 7.9 cm above the carina, recommend 3 cm advancement. Stable RIGHT PICC. Interval removal of nasogastric tube. Stable cardiomegaly with small pleural effusions and bibasilar atelectasis/scarring. Aortic Atherosclerosis (ICD10-I70.0). Electronically Signed   By: Elon Alas M.D.   On: 04/10/2018 18:45   Dg Chest Port 1 View  Result Date: 04/09/2018 CLINICAL DATA:  Respiratory failure. EXAM: PORTABLE CHEST 1 VIEW COMPARISON:  04/08/2018 FINDINGS: Endotracheal  tube tip 4.1 cm above the carina. Right PICC line tip: Lower SVC. A nasogastric tube enters the stomach. Stable bilateral lower lobe airspace opacities. The lungs appear otherwise clear. Atherosclerotic calcification of the aortic arch. Degenerative glenohumeral arthropathy bilaterally. No pneumothorax. IMPRESSION: 1. Stable bibasilar airspace opacities. Tubes and lines remain satisfactorily position. 2.  Aortic Atherosclerosis (ICD10-I70.0). Electronically Signed   By: Van Clines M.D.   On: 04/09/2018 10:33   Dg Chest Port 1 View  Result Date: 04/08/2018 CLINICAL DATA:  Respiratory failure /ETT,HX COPD,CHF EXAM: PORTABLE CHEST 1 VIEW COMPARISON:  04/07/2018 FINDINGS: Endotracheal tube tip 2.8 cm above the carina. Right PICC line tip: Lower SVC. Nasogastric tube enters the stomach. The patient is rotated to the right on today's radiograph, reducing diagnostic sensitivity and specificity. Continued bandlike opacity at the right lung base with some adjacent volume loss, favoring atelectasis. Indistinct retrocardiac airspace opacity, stable, with some obscuration of the left hemidiaphragm.  Borderline enlargement of the cardiopericardial silhouette. No edema. IMPRESSION: 1. Stable bibasilar airspace opacities. The right basilar airspace opacity favors atelectasis. The left lower lobe airspace opacity is nonspecific. 2. Borderline enlargement of the cardiopericardial silhouette. Electronically Signed   By: Van Clines M.D.   On: 04/08/2018 07:16   Dg Chest Port 1 View  Result Date: 04/07/2018 CLINICAL DATA:  77 year old female status post endotracheal tube revision EXAM: PORTABLE CHEST 1 VIEW COMPARISON:  Prior radiograph earlier this morning at 9:38 a.m. FINDINGS: The tip of the endotracheal tube is now 2.6 cm above the carina. Unchanged position of right PICC with the tip overlying the superior cavoatrial junction. The gastric tube is also unchanged, the tip lies below the field of view presumably  within the stomach. Stable cardiomegaly and left greater than right base basilar atelectasis. No acute osseous abnormality. IMPRESSION: Successful repositioning of endotracheal tube. The endotracheal tube tip is now 2.6 cm above the carina. Electronically Signed   By: Jacqulynn Cadet M.D.   On: 04/07/2018 11:36   Dg Chest Port 1 View  Result Date: 04/07/2018 CLINICAL DATA:  77 year old female with respiratory failure, intubated EXAM: PORTABLE CHEST 1 VIEW COMPARISON:  Prior chest x-ray 04/06/2018 FINDINGS: The patient remains intubated. The tip of the endotracheal tube is 1.4 cm above the carina. Right upper extremity PICC with the tip overlying the cavoatrial junction. A gastric tube is present. The tip is not identified but lies below the diaphragm, presumably within the stomach. Stable cardiomegaly. Atherosclerotic calcifications in the transverse aorta. Slightly improved inspiratory volumes with persistent but decreasing left greater than right basilar atelectasis. No acute osseous abnormality. IMPRESSION: 1. Stable and satisfactory support apparatus. 2. Improved inspiratory volumes with decreasing bibasilar atelectasis. 3. Stable cardiomegaly. 4.  Aortic Atherosclerosis (ICD10-170.0). Electronically Signed   By: Jacqulynn Cadet M.D.   On: 04/07/2018 10:29   Dg Chest Port 1 View  Result Date: 04/06/2018 CLINICAL DATA:  Respiratory failure EXAM: PORTABLE CHEST 1 VIEW COMPARISON:  04/05/2018 FINDINGS: Endotracheal tube close to the carina at 1.8 cm. No change. NG tube extends the stomach. Interval placement of RIGHT PICC line with tip in distal SVC. RIGHT basilar atelectasis.  No pulmonary edema.  No pneumothorax. IMPRESSION: 1. Stable support apparatus.  Low endotracheal tube unchanged. 2. RIGHT PICC line with tip in distal SVC. Electronically Signed   By: Suzy Bouchard M.D.   On: 04/06/2018 08:47   Dg Chest Port 1 View  Result Date: 04/05/2018 CLINICAL DATA:  Respiratory failure EXAM: PORTABLE  CHEST 1 VIEW COMPARISON:  Two days ago FINDINGS: Endotracheal tube tip 1 cm above the carina. An orogastric tube reaches the stomach. Haziness of the lower chest with streaky density. No edema, effusion, or pneumothorax. Chronic cardiomegaly. IMPRESSION: 1. Stable hardware positioning. Endotracheal tube tip is 1 cm above the carina. 2. Low lung volumes with streaky opacities at the bases favoring atelectasis. Aspiration and infection is not excluded. Electronically Signed   By: Monte Fantasia M.D.   On: 04/05/2018 08:29   Dg Chest Port 1 View  Result Date: 04/03/2018 CLINICAL DATA:  Acute respiratory failure EXAM: PORTABLE CHEST 1 VIEW COMPARISON:  04/03/2018 FINDINGS: Endotracheal tube and NG tube are unchanged. Cardiomegaly with vascular congestion. Small bilateral effusions with bibasilar atelectasis. IMPRESSION: Cardiomegaly with vascular congestion. Small effusions with bibasilar atelectasis. No change. Electronically Signed   By: Rolm Baptise M.D.   On: 04/03/2018 19:57   Dg Chest Port 1 View  Result Date: 04/03/2018 CLINICAL DATA:  Acute  respiratory failure EXAM: PORTABLE CHEST 1 VIEW COMPARISON:  04/02/2018 FINDINGS: Cardiac shadow is stable. Endotracheal tube and nasogastric catheter are again seen and stable. Bibasilar atelectatic changes are noted slightly increased when compare with the prior exam. No sizable effusion or pneumothorax is seen. No bony abnormality is noted. IMPRESSION: Increasing bibasilar atelectatic changes. Electronically Signed   By: Inez Catalina M.D.   On: 04/03/2018 10:55   Dg Chest Port 1 View  Result Date: 04/02/2018 CLINICAL DATA:  77 year old female with a history of endotracheal tube placement EXAM: PORTABLE CHEST 1 VIEW COMPARISON:  04/02/2018, 04/01/2018 FINDINGS: Cardiomediastinal silhouette unchanged in size and contour. Right rotation somewhat accentuates the heart border. Endotracheal tube terminates approximately 2.0 cm above the carina, slightly withdrawn  from the prior. Gastric tube projects over the mediastinum, terminating out of the field of view. Low lung volumes. No large pleural effusion. Similar appearance of patchy airspace opacities at the lung bases. No pneumothorax. IMPRESSION: Endotracheal tube terminates approximately 2.0 cm by of the carina, slightly withdrawn from the prior. Unchanged gastric tube. Similar appearance of low lung volumes and patchy opacities at the lung bases, likely atelectasis. Electronically Signed   By: Corrie Mckusick D.O.   On: 04/02/2018 16:18   Dg Chest Port 1 View  Result Date: 04/02/2018 CLINICAL DATA:  Acute respiratory failure with hypoxia. EXAM: PORTABLE CHEST 1 VIEW COMPARISON:  Radiograph of April 01, 2018. FINDINGS: Stable cardiomegaly. Endotracheal and nasogastric tubes are unchanged in position. No pneumothorax is noted. Stable bibasilar opacities are noted most consistent with atelectasis, right greater than left. Bony thorax is unremarkable. IMPRESSION: Stable support apparatus.  Stable bibasilar atelectasis. Electronically Signed   By: Marijo Conception, M.D.   On: 04/02/2018 11:31   Dg Chest Port 1 View  Result Date: 04/01/2018 CLINICAL DATA:  Ventilator support EXAM: PORTABLE CHEST 1 VIEW COMPARISON:  03/31/2018 FINDINGS: Endotracheal tube tip is 2.5 cm above the carina. Nasogastric tube enters the stomach. Atelectasis in both lower lobes appears similar. Upper lungs remain clear. IMPRESSION: Endotracheal tube and nasogastric tube unchanged in well-positioned. Persistent atelectasis in both lower lobes. Electronically Signed   By: Nelson Chimes M.D.   On: 04/01/2018 09:08   Dg Chest Port 1 View  Result Date: 03/31/2018 CLINICAL DATA:  Endotracheal tube EXAM: PORTABLE CHEST 1 VIEW COMPARISON:  03/30/2018 FINDINGS: Endotracheal tube in good position. NG tube in the stomach. Progression of bibasilar atelectasis. Negative for heart failure or edema or effusion. IMPRESSION: Endotracheal tube in good position.  Progression of bibasilar atelectasis. Electronically Signed   By: Franchot Gallo M.D.   On: 03/31/2018 08:04   Dg Chest Port 1 View  Result Date: 03/30/2018 CLINICAL DATA:  Endotracheal tube placement. EXAM: PORTABLE CHEST 1 VIEW COMPARISON:  Radiograph of March 28, 2018. FINDINGS: Stable cardiomediastinal silhouette. Endotracheal and nasogastric tubes are unchanged in position. No pneumothorax or pleural effusion is noted. Right lung is clear. Mild left basilar subsegmental atelectasis is noted. Bony thorax is unremarkable. IMPRESSION: Mild left basilar subsegmental atelectasis. Stable support apparatus. Electronically Signed   By: Marijo Conception, M.D.   On: 03/30/2018 07:26   Dg Chest Port 1 View  Result Date: 03/28/2018 CLINICAL DATA:  Respiratory failure. EXAM: PORTABLE CHEST 1 VIEW COMPARISON:  03/27/2018. FINDINGS: Endotracheal tube tip noted at the tracheal bifurcation 1 cm above the lower portion of the carina. Proximal repositioning of approximately 3 cm suggested. NG tube noted with its tip below left hemidiaphragm. Stable cardiomegaly. Low lung volumes. Small left  pleural effusion cannot be excluded. No pneumothorax. IMPRESSION: 1. Endotracheal tube tip noted at the tracheal bifurcation 1 cm above the lower portion of the carina, proximal repositioning of approximately 3 cm suggested. NG tube noted with its tip below left hemidiaphragm. 2.  Stable cardiomegaly.  No pulmonary venous congestion. 3.  Low lung volumes.  Small left pleural effusion. Critical Value/emergent results were called by telephone at the time of interpretation on 03/28/2018 at 6:49 am to nurse Roselyn Reef, who verbally acknowledged these results. Electronically Signed   By: Marcello Moores  Register   On: 03/28/2018 06:50   Dg Chest Portable 1 View  Result Date: 03/27/2018 CLINICAL DATA:  Post intubation EXAM: PORTABLE CHEST 1 VIEW COMPARISON:  Portable exam 2027 hours compared to 09/28/2016 FINDINGS: Tip of endotracheal tube projects  1.5 cm above carina. Nasogastric tube extends into stomach. Minimal enlargement of cardiac silhouette. Mediastinal contours and pulmonary vascularity normal. Lungs clear. No pleural effusion or pneumothorax. Bones demineralized. IMPRESSION: Minimal enlargement of cardiac silhouette. Satisfactory tube positions as above. Electronically Signed   By: Lavonia Dana M.D.   On: 03/27/2018 20:45   Dg Abd Portable 1v  Result Date: 04/16/2018 CLINICAL DATA:  Vomiting EXAM: PORTABLE ABDOMEN - 1 VIEW COMPARISON:  April 03, 2018 FINDINGS: The bowel gas pattern is normal. A feeding tube is identified with the distal tip in the distal stomach. Right hip replacement is identified. IMPRESSION: No bowel obstruction. Feeding tube is identified with distal tip in distal stomach. Electronically Signed   By: Abelardo Diesel M.D.   On: 04/16/2018 15:31   Dg Abd Portable 1v  Result Date: 04/03/2018 CLINICAL DATA:  NG tube placement EXAM: PORTABLE ABDOMEN - 1 VIEW COMPARISON:  None. FINDINGS: NG tube tip is in the distal stomach near the pylorus. Bibasilar atelectasis. Gas within mildly distended colon. IMPRESSION: NG tube tip in the distal stomach. Electronically Signed   By: Rolm Baptise M.D.   On: 04/03/2018 21:28   [place] Ekg Site Rite  Result Date: 04/05/2018 If Site Rite image not attached, placement could not be confirmed due to current cardiac rhythm.    PHYSICAL EXAM      Per [place] - Well nourished, well developed, on trach collar. Cardiovascular - Regular rate and rhythm. Skin - R FA edema resolved, Skin above trach reddened and sore to touch - unchanged from yesterday -still with redness and edema.  No skin breakdown. Trach collar not tight. Crusting noted. Abd Soft and nondistended.   Core track in place without redness/breakdown. EEG leads in place  Neuro - on trach collar, eyes open on voice, wave right hand to greet provider today. Mouth words appropriately. Pupils are equal and reactive to light. left  gaze preference. Blinking to visual threat on the left but not on the right. Slight Right facial droop. MAE x 4 against gravity BUE 3+/5 and BLE 3/5. babinski negative bilaterally. Sensation intact. Coordination not cooperative and gait not tested.     ASSESSMENT/PLAN Ms. Amber Rogers is a 77 y.o. female with history of COPD, CHF, HTN, HLD, CKD III, CAD s/p stent presenting with sudden onset nausea, vomiting and dizziness.  Overall, poor neuro progression with ongoing medical complications. Palliative care involved. Dtrs leaning toward comfort care. 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  Mild hydrocephalus lateral ventricles.    CTA head no vascular malformation or abnormal enhancement.  Stable cerebellar and IVH.  No LVO.  Intracranial atherosclerosis.  Repeat CT x 3 - Stable volume of intracranial hemorrhage and hydrocephalus  CUS - Significant calcific plaque bilateral bifurcations, however, velocities are not significantly increased.  2D Echo EF 60-65%  EEG negative for seizure  UDS neg  LDL 66  HgbA1c 5.5  Heparin subq for VTE prophylaxis  aspirin 81 mg daily and Brilinta 90 mg twice daily prior to admission, now on no antithrombotics d/t hmg  Therapy recommendations:  CIR  Disposition:  Pending. Would likely be a good LTAC candidate if family do not want to pursue palliative care  Palliative consult for long-term care options - made DNR on Friday - f/u meeting scheduled for Tuesday.   Lethargy  On provigil 200 mg daily and amantadine 100 twice daily  Ongoing intermittent lethargy and AMS  LTM EEG place to rule  out seizure showed slowing  Obstructve hydrocephalus s/p R frontal EVD   EVD placed 8/22, replaced 8/28  CT showed recurrent hydro prior to EVD replacement. Repeat CT head Monday pending.  Repeat CT stable after EVD replacement  Repeat CT stable 9/5 after 48h of EVD clamping  EVD removed on 04/10/18  NSG signed off  CT repeat 04/12/18 and 9/15 stable mild hydrocephalus  Removed staples from head  MSSA Pneumonia Possible intermittent aspiration. Tracheobronchitis Leukocytosis Respiratory distress   T Max  99.3->101.3 ->100.1->99.2  UA neg for UTI,   Blood culture NNGTD  Leukocytosis  15  On home prednisone for COPD  Sputum Cx +MSSA  Abx changed to vanco/zosyn on 9/14 by CCM  Repeat CXR 9/14 infection over right midlung, worsening interstitial edema  Tracheal aspirate culture sent - FEW STAPHYLOCOCCUS AUREUS   CCM on board  Carotid stenosis   CUS 08/2017 R ICA stenosis 50-69%, L ICA < 50%  CUS repeat showed b/l ICA 1-39% stenosis but significant calcified plaque at carotid bifurcation bilaterally.   Acute Hypoxic Respiratory failure - extubation and re-intubation S/p Trach placement  Intubated in the ED with neuro decline   Extubated and re-intubated 04/03/18  Trach done 04/11/18  Currently on trach collar  Trach suture removed   Treated with morphine for respiratory distress.   Dysphagia secondary to stroke  NPO on tube feeds.   Tube feeds changed from bolus to continuous drip d/t difficulty pushing through coretrak tube (small caliber)  Hypertensive Emergency  Treated with cardene in ICU  Home Meds: Norvasc 10, resumed  Currently on norvasc 10 and increase hydralazine to 75 Q8   Add lisinopril 20 mg bid   SBP goal < 160  BP occasionally over 160  AKI  Cre 0.87->1.02->1.11  On free water 33cc/h and TF @ 40cc/h  Na 143->141  Continue monitoring  Hyperlipidemia  Home meds:  ?? Lipitor 80   LDL 66  Hold statin for now. Recheck  in 6-8 weeks. If remains low, no statin needed. If elevated, resume statin.   Other Stroke Risk Factors  Advanced age  Former Cigarette smoker, quit 2 years ago  Coronary artery disease s/p stent - on ASA and brilinta PTA  Chronic Congestive heart failure  Anemia, microcytic   Essentially stable this admission 7.8->6.9->10->9.0->10.5->9.4 -> 8.8->9.0  Last documented 11.0 Feb 2018  2u PRBC 9/9  check stool guiac positive  Iron 130, Fer 94, VB12 403, lactate 166, haptoglobin 413, Folate 14.8, FE studies 94   On iron tab 325mg  bid  Other Active Problems  Hx vertigo   COPD - on prednisone, weaning underway   Hypokalemia 3.2->3.4->3.3 ->3.7 - supplement  Hospital day # [place], MSN, APRN, ANVP-BC, AGPCNP-BC Advanced Practice Stroke Nurse [place] for Schedule & Pager information 04/21/2018 3:19 PM  I have personally examined this patient, reviewed notes, independently viewed imaging studies, participated in medical decision making and plan of care.ROS completed by me personally and pertinent positives fully documented  I have made any additions or clarifications directly to the above note. Agree with note above.the patient continues to do poorly because of medical issues and neurologically she is not making great recovery. Unfortunately she needs PEG tube if medical care is to continue and family is struggling with the decision. Palliative care team is following and hopefully will make a decision in the next 1-2 days., Discussion with the patient and her 2 daughters and granddaughter at the bedside and answered questions about her care. Greater than 50% time during this 35 minute visit was spent on counseling and coordination of care about her condition and discussion with care team Antony Contras, MD Medical Director [place] Pager: [phone] 04/21/2018 4:47 PM  To contact Stroke Continuity provider, please refer to  [email or web address] After hours, contact General Neurology

## 2018-04-21 NOTE — Progress Notes (Signed)
Dr. Cheral Marker notified pt has had repeated episodes of desaturation and orthopnea with increased WOB and oxygen demands. RT called to bedside twice for lavage when RN suctioning was insufficient to clear airway. Pt trying to get out of bed due to difficulty breathing and pulling oxygen from over trach up to mouth and nose out of desperation to get air. Mucus plugs suctioned out and Mucomyst ordered. Will continue to suction and maintain airway every hour, chest PT being performed by RT since 9/15 per Pilar Plate, RT.

## 2018-04-21 NOTE — Progress Notes (Signed)
Inpatient Rehabilitation-Admissions Coordinator   Noted palliative care meeting tomorrow with possible transition to comfort care. Pt is not appropriate for CIR at this time. AC will sign off.   Please call if questions.   Jhonnie Garner, OTR/L  Rehab Admissions Coordinator  [phone] 04/21/2018 4:26 PM

## 2018-04-21 NOTE — Plan of Care (Signed)
  Problem: Education: Goal: Knowledge of disease or condition will improve Outcome: Progressing Goal: Knowledge of secondary prevention will improve Outcome: Progressing Goal: Knowledge of patient specific risk factors addressed and post discharge goals established will improve Outcome: Progressing   Problem: Coping: Goal: Will verbalize positive feelings about self Outcome: Progressing   Problem: Health Behavior/Discharge Planning: Goal: Ability to manage health-related needs will improve Outcome: Progressing   Problem: Nutrition: Goal: Risk of aspiration will decrease Outcome: Progressing Goal: Dietary intake will improve Outcome: Progressing   Problem: Intracerebral Hemorrhage Tissue Perfusion: Goal: Complications of Intracerebral Hemorrhage will be minimized Outcome: Progressing   Problem: Health Behavior/Discharge Planning: Goal: Ability to manage health-related needs will improve Outcome: Progressing   Problem: Clinical Measurements: Goal: Ability to maintain clinical measurements within normal limits will improve Outcome: Progressing Goal: Will remain free from infection Outcome: Progressing Goal: Diagnostic test results will improve Outcome: Progressing Goal: Respiratory complications will improve Outcome: Progressing Goal: Cardiovascular complication will be avoided Outcome: Progressing   Problem: Coping: Goal: Level of anxiety will decrease Outcome: Progressing   Problem: Elimination: Goal: Will not experience complications related to bowel motility Outcome: Progressing Goal: Will not experience complications related to urinary retention Outcome: Progressing   Problem: Pain Managment: Goal: General experience of comfort will improve Outcome: Progressing   Problem: Safety: Goal: Ability to remain free from injury will improve Outcome: Progressing   Problem: Skin Integrity: Goal: Risk for impaired skin integrity will decrease Outcome:  Progressing

## 2018-04-21 NOTE — Progress Notes (Addendum)
[facility] PCCM   S: Called back for respiratory distress   Past Medical History:  Diagnosis Date  . Asthma   . CHF (congestive heart failure) ([place])   . Chronic kidney disease (CKD), active medical management without dialysis, stage 3 (moderate) ([place])   . COPD (chronic obstructive pulmonary disease) ([place])   . Coronary artery disease   . High cholesterol   . Hypertension   . Myocardial infarction ([place]) 04/2015  . Osteoarthritis    "all over" (09/27/2016)  . Pneumonia 1996     O:  Vitals:   04/21/18 0346 04/21/18 0500 04/21/18 0556 04/21/18 0603  BP: (!) 170/69  (!) 155/55 (!) 155/55  Pulse: 88   73  Resp: (!) 33   (!) 33  Temp:  99 F (37.2 C)    TempSrc:  Oral    SpO2: 97%   97%  Weight:      Height:       98%  General: In bed, Moderate Respiratory distress  HENT: Trach in place  PULM: Rhonchi noted, no wheeze, crackles to bases  CV: RRR, no mgr GI: BS+, soft, nontender MSK: normal bulk and tone Neuro: Awakens with stimulation, follows commands   CBC    Component Value Date/Time   WBC 15.0 (H) 04/21/2018 0513   RBC 3.29 (L) 04/21/2018 0513   HGB 9.0 (L) 04/21/2018 0513   HCT 30.7 (L) 04/21/2018 0513   PLT 400 04/21/2018 0513   MCV 93.3 04/21/2018 0513   MCH 27.4 04/21/2018 0513   MCHC 29.3 (L) 04/21/2018 0513   RDW 15.9 (H) 04/21/2018 0513   LYMPHSABS 1.5 03/27/2018 1403   MONOABS 0.9 03/27/2018 1403   EOSABS 0.2 03/27/2018 1403   BASOSABS 0.1 03/27/2018 1403     Acute Hypoxic Respiratory Distress -noted to have mucous plugging overnight  -CXR with pulmonary vascular congestion  Plan  -Spoke to Daughter [name]) states that previous plan was for Full DNR and if patient were to decompensate family was leaning more towards comfort measures, family is on the way in  -PRN Morphine, Robinul  -Continue mucomyst -Continue Pulmicort, Brovana, Duoneb   -Given Lasix 40 meq   Addendum: Post-Morphine, patient is resting comfortably with decreased tachypnea     Hayden Pedro, AGACNP-BC [facility]  Pgr: [phone]  PCCM Pgr: [phone]

## 2018-04-21 NOTE — Progress Notes (Signed)
PT Cancellation Note  Patient Details Name: Amber Rogers MRN: [id number] DOB: Dec 28, 1940   Cancelled Treatment:    Reason Eval/Treat Not Completed: Patient not medically ready Pt in respiratory distress at rest. Having meeting with palliative care tomorrow for potential transition to comfort care. Not appropriate for PT at this time. Will likely end up signing off but will follow after palliative meeting.   Marguarite Arbour A Krystale Rinkenberger 04/21/2018, 2:32 PM Wray Kearns, PT, DPT Acute Rehabilitation Services Pager [phone] Office [phone]

## 2018-04-21 NOTE — Progress Notes (Signed)
Called to floor for respiratory distress. RT at bedside. Respirations improved after she was suctioned orally and via tracheostomy tube. Thick white secretions per RT and RN. No chest pain.   Exam: Respirations 27-33 while at bedside. Trending down per RT. Lungs auscultated with crackles at bases, rhonchi bilaterally at apices, some wheezes noted intermittently.  Ment: Able to follow simple motor commands. Visually tracks with intact EOM. Answers a question with a head nod. Lifts upper extremities antigravity.   A/R: Respiratory distress secondary to excess secretions/mucus plugging 1. Low suspicion for PE. 2. No CP to suggest MI. Has improved with suctioning. 3. Portable CXR 4. O2 by mask over trach site increased to 80% 5. Continue humidification of O2 by mask 6. Head of bed to 30 degrees 7. No evidence of tube feeds in suctioned mucus, therefore continue TF 8. Suction PRN but also with general goal of 1x/hr with RN and q4h with RT  Electronically signed: Dr. Kerney Elbe

## 2018-04-21 NOTE — Progress Notes (Signed)
CXR results paged to Dr. Cheral Marker

## 2018-04-21 NOTE — Progress Notes (Signed)
[facility] Progress Note Patient Name: MCKAYLEE DIMALANTA DOB: 1940-12-11 MRN: [id number]   Date of Service  04/21/2018  HPI/Events of Note  Respiratory distress - improved with suctioning of dried respiratory secretions. However, CXR reveals low lung volumes (atelectasis?) and pulmonary vascular congestion. LVEF = 60-65%. BP = 155/55.  eICU Interventions  Will order: 1. Lasix 40 mg IV now.  2. Incentive spirometry Q 1 hour while awake. 3. Will ask ground team to evaluate at bedside.      Intervention Category Major Interventions: Hypoxemia - evaluation and management  Sommer,Steven Eugene 04/21/2018, 6:00 AM

## 2018-04-22 ENCOUNTER — Inpatient Hospital Stay (HOSPITAL_COMMUNITY): Payer: Medicare Other

## 2018-04-22 DIAGNOSIS — J9601 Acute respiratory failure with hypoxia: Secondary | ICD-10-CM

## 2018-04-22 DIAGNOSIS — J9811 Atelectasis: Secondary | ICD-10-CM

## 2018-04-22 DIAGNOSIS — Z515 Encounter for palliative care: Secondary | ICD-10-CM

## 2018-04-22 DIAGNOSIS — J690 Pneumonitis due to inhalation of food and vomit: Secondary | ICD-10-CM

## 2018-04-22 LAB — BASIC METABOLIC PANEL
ANION GAP: 10 (ref 5–15)
BUN: 24 mg/dL — ABNORMAL HIGH (ref 8–23)
CALCIUM: 8.1 mg/dL — AB (ref 8.9–10.3)
CHLORIDE: 99 mmol/L (ref 98–111)
CO2: 34 mmol/L — AB (ref 22–32)
CREATININE: 1.33 mg/dL — AB (ref 0.44–1.00)
GFR calc non Af Amer: 38 mL/min — ABNORMAL LOW (ref >60)
GFR, EST AFRICAN AMERICAN: 44 mL/min — AB (ref >60)
Glucose, Bld: 172 mg/dL — ABNORMAL HIGH (ref 70–99)
Potassium: 3.7 mmol/L (ref 3.5–5.1)
SODIUM: 143 mmol/L (ref 135–145)

## 2018-04-22 LAB — CBC
HEMATOCRIT: 31 % — AB (ref 36.0–46.0)
HEMOGLOBIN: 9.2 g/dL — AB (ref 12.0–15.0)
MCH: 27.5 pg (ref 26.0–34.0)
MCHC: 29.7 g/dL — ABNORMAL LOW (ref 30.0–36.0)
MCV: 92.8 fL (ref 78.0–100.0)
Platelets: 458 10*3/uL — ABNORMAL HIGH (ref 150–400)
RBC: 3.34 MIL/uL — ABNORMAL LOW (ref 3.87–5.11)
RDW: 15.9 % — ABNORMAL HIGH (ref 11.5–15.5)
WBC: 16.9 10*3/uL — AB (ref 4.0–10.5)

## 2018-04-22 LAB — GLUCOSE, CAPILLARY
GLUCOSE-CAPILLARY: 167 mg/dL — AB (ref 70–99)
GLUCOSE-CAPILLARY: 142 mg/dL — AB (ref 70–99)
Glucose-Capillary: 122 mg/dL — ABNORMAL HIGH (ref 70–99)
Glucose-Capillary: 99 mg/dL (ref 70–99)

## 2018-04-22 LAB — VANCOMYCIN, TROUGH: VANCOMYCIN TR: 31 ug/mL — AB (ref 15–20)

## 2018-04-22 MED ORDER — LORAZEPAM 2 MG/ML IJ SOLN
1.0000 mg | INTRAMUSCULAR | Status: DC | PRN
  Administered 2018-04-23: 1 mg via INTRAVENOUS
  Filled 2018-04-22: qty 1

## 2018-04-22 MED ORDER — IPRATROPIUM-ALBUTEROL 0.5-2.5 (3) MG/3ML IN SOLN: 3.0000 mL | RESPIRATORY_TRACT | Status: DC

## 2018-04-22 MED ORDER — IPRATROPIUM-ALBUTEROL 0.5-2.5 (3) MG/3ML IN SOLN
3.0000 mL | Freq: Three times a day (TID) | RESPIRATORY_TRACT | Status: DC
  Administered 2018-04-22 – 2018-04-24 (×5): 3 mL via RESPIRATORY_TRACT
  Filled 2018-04-22 (×7): qty 3

## 2018-04-22 MED ORDER — FUROSEMIDE 10 MG/ML IJ SOLN
40.0000 mg | Freq: Three times a day (TID) | INTRAMUSCULAR | Status: DC
  Administered 2018-04-22: 40 mg via INTRAVENOUS
  Filled 2018-04-22: qty 4

## 2018-04-22 MED ORDER — BIOTENE DRY MOUTH MT LIQD: 15.0000 mL | OROMUCOSAL | Status: DC | PRN

## 2018-04-22 MED ORDER — MORPHINE SULFATE (PF) 2 MG/ML IV SOLN
2.0000 mg | INTRAVENOUS | Status: DC | PRN
  Administered 2018-04-23: 2 mg via INTRAVENOUS
  Filled 2018-04-22: qty 1

## 2018-04-22 MED ORDER — POLYVINYL ALCOHOL 1.4 % OP SOLN
1.0000 [drp] | Freq: Four times a day (QID) | OPHTHALMIC | Status: DC | PRN
  Filled 2018-04-22: qty 15

## 2018-04-22 NOTE — Progress Notes (Signed)
Assisted with transport of patient to CT on venturi TC. No respiratory issues noted Patient now on ^0% TC.

## 2018-04-22 NOTE — Progress Notes (Signed)
  Speech Language Pathology Treatment: Nada Boozer Speaking valve  Patient Details Name: Amber Rogers MRN: [id number] DOB: 1940/10/27 Today's Date: 04/22/2018 Time: 1030-1040 SLP Time Calculation (min) (ACUTE ONLY): 10 min  Assessment / Plan / Recommendation Clinical Impression  Pt seen for skilled ST treatment for PMSV trials. Pt lethargic and was initially minimally responsive to verbal, tactile cues. SLP facilitated alertness with cool washcloth to face, and by providing oral care to assist with mobilizing lips and tongue. Pt kept her eyes closed for most of session, though she did intermittently follow commands to open her eyes; mod-max A required to maintain sufficient arousal. She made no attempts to respond to questions or interact with her daughter or sister who were present at bedside. Cuff deflated at baseline. With placement of PMSV, there is again immediate pressure on trach hub with valve displacement after 1-2 breath cycles. No evidence of upper airway patency with multiple attempts. Pt does not attempt to cough or clear secretions despite cues. Continue to suspect that without downsizing or changing to cuffless trach, pt will be unable to use PMSV. Palliative care meeting planned for this afternoon. SLP will continue to follow.     HPI HPI: 77 year old female with PMH of HTN, CKD, CHF, and COPD. She presented to [facility] ED 8/22 with complaints of headache, nausea, and vomiting. 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 ETT 8/22-29;  reintubated same day due to stridor; ETT 8/29-9/5; reintubated again due to stridor; tracheostomy 9/6.       SLP Plan  Continue with current plan of care       Recommendations         Patient may use Passy-Muir Speech Valve: with SLP only PMSV Supervision: Full MD: Please consider changing trach tube to : Smaller size;Cuffless         Oral Care Recommendations: Oral care  QID Follow up Recommendations: Other (comment)(tbd) SLP Visit Diagnosis: Aphonia (R49.1) Plan: Continue with current plan of care       [place], [place], [place] Speech-Language Pathologist Acute Rehabilitation Services Pager: [phone] Office: [phone]  Aliene Altes 04/22/2018, 2:16 PM

## 2018-04-22 NOTE — Progress Notes (Signed)
STROKE TEAM PROGRESS NOTE   INTERVAL HISTORY Family at bedside. Pt doing better today from respiratory standpoint. She had CT scan of the head yesterday evening which showed no acute change and stable appearance. I discussed goals and plans with entire family and pt. Pt told family she did not want PEG or SNF but told Dr. Leonie Man she did. Feel pt unreliable. Will defer to dtrs for decision making after meeting later today with palliative care M.D..   Vitals:   04/22/18 0753 04/22/18 0959 04/22/18 1009 04/22/18 1100  BP:      Pulse: 75 72 76 73  Resp: (!) 25 (!) 24 (!) 27 16  Temp:      TempSrc:      SpO2: 93% 90% 92% 92%  Weight:      Height:        CBC:  Recent Labs  Lab 04/21/18 0513 04/22/18 0601  WBC 15.0* 16.9*  HGB 9.0* 9.2*  HCT 30.7* 31.0*  MCV 93.3 92.8  PLT 400 458*    Basic Metabolic Panel:  Recent Labs  Lab 04/21/18 0513 04/22/18 0601  NA 143 143  K 3.7 3.7  CL 102 99  CO2 27 34*  GLUCOSE 183* 172*  BUN 25* 24*  CREATININE 1.11* 1.33*  CALCIUM 8.1* 8.1*   IMAGING Ct Angio Head W Or Wo Contrast  Result Date: 03/27/2018 CLINICAL DATA:  77 y/o  F; nausea, vomiting, dizziness. EXAM: CT ANGIOGRAPHY HEAD TECHNIQUE: Multidetector CT imaging of the head was performed using the standard protocol during bolus administration of intravenous contrast. Multiplanar CT image reconstructions and MIPs were obtained to evaluate the vascular anatomy. CONTRAST:  62mL ISOVUE-370 IOPAMIDOL (ISOVUE-370) INJECTION 76% COMPARISON:  03/27/2018 CT head. FINDINGS: CTA HEAD Anterior circulation: No significant stenosis, proximal occlusion, aneurysm, or vascular malformation. Calcified plaque of the carotid siphons with mild bilateral distal cavernous stenosis. Posterior circulation: No significant stenosis, proximal occlusion, aneurysm, or vascular malformation. Calcified plaque of the vertebral arteries with mild stenosis downstream to the PICA origins. Venous sinuses: As permitted by  contrast timing, patent. Anatomic variants: Patent anterior communicating artery. No posterior communicating artery identified, likely hypoplastic or absent. Delayed phase: No abnormal intracranial enhancement. IMPRESSION: 1. No vascular malformation or abnormal enhancement of the brain identified. 2. Stable cerebellar and intraventricular hemorrhage. 3. Patent anterior and posterior intracranial circulation. No large vessel occlusion, aneurysm, or significant stenosis. 4. Calcific atherosclerosis of the carotid and vertebral arteries with mild stenosis. Electronically Signed   By: Kristine Garbe M.D.   On: 03/27/2018 17:52   Ct Head Wo Contrast  Result Date: 04/22/2018 CLINICAL DATA:  77 year old female status post cerebellar hemorrhage in August. No purposeful movements. EXAM: CT HEAD WITHOUT CONTRAST TECHNIQUE: Contiguous axial images were obtained from the base of the skull through the vertex without intravenous contrast. COMPARISON:  Head CT without contrast 04/20/2018 and earlier. 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 Stable hypodensity in the left central cerebellum abutting the 4th ventricle with no significant regional mass effect. Patent basilar cisterns. No residual posterior fossa blood products identified. Stable gray-white matter differentiation elsewhere, patchy bilateral cerebral white matter hypodensity. No midline shift or intracranial mass effect. Vascular: Calcified atherosclerosis at the skull base. Skull: Stable.  No acute osseous abnormality identified. Sinuses/Orbits: Motion artifact at the skull base. Left nasoenteric tube remains in place. Paranasal sinuses are stable and well pneumatized. Stable mild bilateral mastoid effusions and left tympanic cavity opacification. Other:  No acute orbit or scalp soft tissue findings; mild postoperative changes from right frontal EVD. IMPRESSION: 1. Stable  CT appearance of the brain since 04/20/2018: Rounded area of hypodensity in the left cerebellum without significant mass effect. Trace residual IVH without ventriculomegaly. 2. Mastoid effusions and left middle ear opacification. Electronically Signed   By: Genevie Ann M.D.   On: 04/22/2018 09:54   Ct Head Wo Contrast  Result Date: 04/20/2018 CLINICAL DATA:  Altered mental status, assess for hemorrhage. EXAM: CT HEAD WITHOUT CONTRAST TECHNIQUE: Contiguous axial images were obtained from the base of the skull through the vertex without intravenous contrast. COMPARISON:  CT HEAD April 12, 2017 FINDINGS: Moderately motion degraded examination further limited by streak artifact from EEG leads, image quality somewhat improved on repeat examination. BRAIN: Evolving LEFT cerebellar infarct, less conspicuous blood products, potentially obscured by motion. Mild regional mass effect without midline shift. Old small RIGHT cerebellar infarct. Probable basal ganglia lacunar infarcts. Similar dependent blood products bilateral occipital horns. Mild ventriculomegaly in the base of global parenchymal brain volume loss, improved from prior examination. No hydrocephalus. Patchy to confluent supratentorial white matter hypodensities. No acute large vascular territory infarcts. No abnormal extra-axial fluid collections. VASCULAR: Moderate to severe calcific atherosclerosis of the carotid siphons. SKULL: No skull fracture. RIGHT frontal burr hole. Small RIGHT frontal exostosis. No significant scalp soft tissue swelling. SINUSES/ORBITS: Trace paranasal sinus mucosal thickening. Nasogastric tube via LEFT nares. RIGHT mastoid effusion. LEFT middle ear and mastoid effusion. Included ocular globes and orbital contents are non-suspicious. OTHER: Poor dentition with multiple dental caries and periapical abscess. IMPRESSION: 1. No acute intracranial process on this motion degraded examination. 2. LEFT cerebellar edema at site of prior  hemorrhage without demonstrable blood products. 3. Similar intraventricular hemorrhage without hydrocephalus. Electronically Signed   By: Elon Alas M.D.   On: 04/20/2018 05:45   Ct Head Wo Contrast  Result Date: 04/12/2018 CLINICAL DATA:  Follow-up intracranial hemorrhage. Interval removal of EVD. EXAM: CT HEAD WITHOUT CONTRAST TECHNIQUE: Contiguous axial images were obtained from the base of the skull through the vertex without intravenous contrast. COMPARISON:  04/10/2018 FINDINGS: The study is mildly motion degraded. Brain: The ventriculostomy catheter has been removed in the interim with minimal blood again noted along the prior catheter tract in the right frontal lobe. A small locule of gas is noted in the temporal horn of the right lateral ventricle. Small volume blood in the occipital horns of the lateral ventricles has decreased on the right and has slightly increased on the left reflecting some interval redistribution without significant change in overall volume. Left cerebellar hematoma demonstrates stable to slightly decreased density and no significant change in size. Surrounding vasogenic edema is also unchanged with partial effacement of the fourth ventricle. Mild lateral and third ventriculomegaly is unchanged. There is no midline shift or extra-axial fluid collection. No acute large territory infarct or new intracranial hemorrhage is identified. Patchy cerebral white matter hypodensities are unchanged and nonspecific but compatible with moderate chronic small vessel ischemic disease. A small chronic infarct is again noted in the right cerebellum. Vascular: Calcified atherosclerosis at the skull base. No hyperdense vessel. Skull: Right frontal burr hole with adjacent skin staples remaining in place. Small right lateral skull osteoma. Sinuses/Orbits: Grossly unremarkable orbits within limitations of motion. No evidence of acute inflammatory paranasal sinus disease. Similar appearance of  small bilateral mastoid effusions. Other: None. IMPRESSION: 1. Interval EVD removal with unchanged mild ventriculomegaly. 2. Unchanged left cerebellar hematoma and surrounding vasogenic edema. 3.  Small volume intraventricular hemorrhage without significant change. 4. No evidence of new intracranial abnormality. Electronically Signed   By: Logan Bores M.D.   On: 04/12/2018 19:22   Ct Head Wo Contrast  Result Date: 04/10/2018 CLINICAL DATA:  Follow-up examination for a communicating hydrocephalus. EXAM: CT HEAD WITHOUT CONTRAST TECHNIQUE: Contiguous axial images were obtained from the base of the skull through the vertex without intravenous contrast. COMPARISON:  Prior CT from 04/08/2018 FINDINGS: Brain: Ventriculomegaly overall not significantly changed relative to previous exam. Degenerating blood products seen layering within the occipital horns of the lateral ventricles. Right frontal approach ventriculostomy remains in place with tip near the foramen of Monro. Small amount of blood products along the ventricular tract with surrounding edema. Left cerebellar hematoma continues to evolve, slightly less prominent as compared to previous. Persistent surrounding vasogenic edema with mass effect and partial effacement on the adjacent fourth ventricle, slightly improved. Stable atrophy with chronic small vessel ischemic disease. No acute large vessel territory infarct. Chronic right cerebellar infarct noted. No extra-axial fluid collection. Vascular: No hyperdense vessel. Calcified atherosclerosis at the skull base. Skull: Right frontal burr hole with overlying skin staples. Scalp soft tissues and calvarium demonstrate no acute new abnormality. Sinuses/Orbits: Globes and orbital soft tissues demonstrate no acute finding. Mild mucosal thickening within the maxillary sinuses. Bilateral mastoid effusions are similar to previous. Other: Multiple dental caries noted. IMPRESSION: 1. Continued interval evolution of left  cerebellar hematoma with slightly improved localized vasogenic edema, with persistent but slightly improved mass effect on the adjacent fourth ventricle. 2. Intraventricular extension with similar small volume intraventricular blood products. Stable mild hydrocephalus with right frontal ventriculostomy in place. 3. No other new acute intracranial abnormality. Electronically Signed   By: Jeannine Boga M.D.   On: 04/10/2018 05:49   Ct Head Wo Contrast  Result Date: 04/08/2018 CLINICAL DATA:  Follow up intracranial hemorrhage. Decreased ventriculostomy output. EXAM: CT HEAD WITHOUT CONTRAST TECHNIQUE: Contiguous axial images were obtained from the base of the skull through the vertex without intravenous contrast. COMPARISON:  CT HEAD April 02, 2018 FINDINGS: BRAIN: Stable ventriculomegaly with blood products within the lateral ventricles, layering in the occipital horns and, blood products and fourth ventricle. Similar blood products along the RIGHT frontal ventriculostomy catheter with surrounding vasogenic edema. Patchy white matter supratentorial hypodensities close of aforementioned abnormality compatible with mild chronic small vessel ischemic changes. Minimal likely redistributed subarachnoid hemorrhage. No acute large vascular territory infarct. Evolving LEFT cerebellar hematoma with surrounding low-density vasogenic edema and effacement of the fourth ventricle. Old small RIGHT cerebellar infarct. VASCULAR: Moderate calcific atherosclerosis of the carotid siphons. SKULL: No skull fracture. RIGHT frontal oral hold with mild scalp soft tissue swelling and skin staples. No significant scalp soft tissue swelling. SINUSES/ORBITS: Bilateral mastoid effusions. LEFT maxillary mucosal retention cysts with trace paranasal sinus mucosal thickening. Included ocular globes and orbital contents are non-suspicious. OTHER: Endotracheal tube in place.  Multiple dental caries. IMPRESSION: 1. Evolving LEFT cerebellar  hematoma with intraventricular extension. 2. Stable mild hydrocephalus with RIGHT frontal ventriculostomy catheter, similar blood products along catheter track. 3. Small volume redistributed subarachnoid hemorrhage. Electronically Signed   By: Elon Alas M.D.   On: 04/08/2018 06:00   Ct Head Wo Contrast  Result Date: 04/02/2018 CLINICAL DATA:  Postop unresponsiveness. EXAM: CT HEAD WITHOUT CONTRAST TECHNIQUE: Contiguous axial images were obtained from the base of the skull through the vertex without intravenous contrast. COMPARISON:  Earlier today FINDINGS: Brain: Recurrent lateral and third ventricular hydrocephalus with ballooning of the  horns. Clot is newly seen along the EVD catheter, which is presumably dysfunctional. There is also increased edema around the catheter which is likely tracking CSF. No rebleeding is seen. There is stable left cerebellar, intraventricular, and subarachnoid clot. Negative for infarct. Vascular: Negative Skull: Unremarkable burr hole for EVD.  Right parietal osteoma. Sinuses/Orbits: Negative Other: During reading, case discussed with RN Roselyn Reef and findings were expected. She is currently calling Dr Kathyrn Sheriff for orders. IMPRESSION: 1. Recurrent lateral and third ventricular hydrocephalus. Newly seen clot along the EVD, presumed cause of catheter dysfunction. 2. No interval bleeding. 3. Increased low-density along the EVD, likely tracking CSF. 4. No herniation or infarct. Electronically Signed   By: Monte Fantasia M.D.   On: 04/02/2018 13:14   Ct Head Wo Contrast  Result Date: 04/01/2018 CLINICAL DATA:  77 y/o  F; follow-up of intracranial hemorrhage. EXAM: CT HEAD WITHOUT CONTRAST TECHNIQUE: Contiguous axial images were obtained from the base of the skull through the vertex without intravenous contrast. COMPARISON:  03/30/2018 CT head.  09/24/2014 CT head. FINDINGS: Brain: Partial interval dispersion of intraventricular, subarachnoid, and left cerebellar brain  parenchymal hemorrhage. No new acute intracranial hemorrhage, stroke, or focal mass effect. Decreased size of the lateral and third ventricles, ventricle size now similar to 2016 CT head. Stable position of right frontal approach ventriculostomy catheter with tip in the right lateral ventricle near the foramen of Monro. Vascular: Calcific atherosclerosis of carotid siphons and vertebral arteries. No hyperdense vessel identified. Skull: Right frontal burr hole postsurgical changes. Torus palatini and maxillaris. Sinuses/Orbits: Endotracheal and enteric tubes. Left maxillary sinus small mucous retention cyst. Visualized paranasal sinuses and mastoid air cells are otherwise normally aerated. Orbits are unremarkable. Other: None. IMPRESSION: 1. Partial interval dispersion of intraventricular, subarachnoid, and left cerebellar brain parenchymal hemorrhage. 2. Resolution of hydrocephalus. Stable position of right frontal approach ventriculostomy catheter. 3. No new acute intracranial process. Electronically Signed   By: Kristine Garbe M.D.   On: 04/01/2018 05:26   Ct Head Wo Contrast  Result Date: 03/30/2018 CLINICAL DATA:  77 y/o F; follow-up for intracranial hemorrhage, intraventricular hemorrhage, and hydrocephalus. EXAM: CT HEAD WITHOUT CONTRAST TECHNIQUE: Contiguous axial images were obtained from the base of the skull through the vertex without intravenous contrast. COMPARISON:  03/28/2018 and 03/27/2018 CT head. FINDINGS: Brain: Stable volume acute hemorrhage within the left medial cerebellar hemisphere, ventricular system, and subarachnoid spaces of the sylvian fissures. There is some interval redistribution, for example decreased hemorrhage within the frontal horns of lateral ventricles, and increased pooling in the occipital horns. No new acute intracranial hemorrhage, stroke, or mass effect. Right frontal approach ventriculostomy catheter is stable in position traversing the frontal horn of right  lateral ventricle. Stable ventricle size. Vascular: Calcific atherosclerosis of carotid siphons and vertebral arteries. Skull: Postsurgical changes related to a right frontal approach ventriculostomy catheter with decreased edema in the scalp. Sinuses/Orbits: Intubated patient. Maxillary sinus mucous retention cysts. Normal aeration of mastoid air cells. Orbits are unremarkable. Other: None. IMPRESSION: Stable volume of intraventricular, subarachnoid, and left cerebellar hemorrhage. Stable ventricle size. No new acute intracranial abnormality. Electronically Signed   By: Kristine Garbe M.D.   On: 03/30/2018 05:12   Ct Head Wo Contrast  Result Date: 03/28/2018 CLINICAL DATA:  77 y/o F; left cerebellar and interventricular hemorrhage with mild hydrocephalus for follow-up. EXAM: CT HEAD WITHOUT CONTRAST TECHNIQUE: Contiguous axial images were obtained from the base of the skull through the vertex without intravenous contrast. COMPARISON:  03/27/2018 CT head and CTA  head. FINDINGS: Brain: Stable volume of intracranial hemorrhage within the left cerebellar hemisphere and the ventricular system. Some interval redistribution of hemorrhage pooling in the occipital horns of lateral ventricles and within the subarachnoid space of the sylvian fissures. No new acute hemorrhage, mass effect, or herniation. Interval placement of a right frontal approach ventriculostomy catheter into the frontal horn of right lateral ventricle with tip near the foramen of Monro. Mild decrease in size of the lateral and third ventricles. Vascular: No hyperdense vessel or unexpected calcification. Skull: Postsurgical changes related to a right frontal craniotomy mild air and edema in the overlying scalp. Sinuses/Orbits: Intubated patient. No acute abnormality of the paranasal sinuses or orbits. Other: None. IMPRESSION: 1. Stable volume of intracranial hemorrhage within the left cerebellar hemisphere and the ventricular system. Some  redistribution of blood products to the lateral ventricle atria and sylvian fissures. 2. No new acute intracranial abnormality. 3. Interval right frontal approach ventriculostomy catheter placement. Mild decrease in hydrocephalus. Electronically Signed   By: Kristine Garbe M.D.   On: 03/28/2018 20:56   Ct Head Wo Contrast  Result Date: 03/27/2018 CLINICAL DATA:  Sudden onset nausea and vomiting EXAM: CT HEAD WITHOUT CONTRAST TECHNIQUE: Contiguous axial images were obtained from the base of the skull through the vertex without intravenous contrast. COMPARISON:  04/16/2015 FINDINGS: Brain: 20 mm hematoma in the central left cerebellum with extension into the fourth ventricle sella projecting LVAD remote of Luschka continuing superiorly through the cerebral aqua duct and into the third and lateral ventricles. There is lateral ventriculomegaly when compared to 2016. Periventricular low-density about the atria could be small-vessel disease and/or transependymal CSF flow when compared with prior. 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 Vanita Panda was stabilizing a patient. The findings were already known to him when we reviewed the images by telephone at 2:45 p.m. 03/27/2018. IMPRESSION: 2 cm hematoma in the deep left cerebellum with fourth ventricular extension extending into the third/lateral ventricles and into the dorsal cervical canal. There is mild hydrocephalus at the lateral ventricles when compared to 2016. A deep cerebellar origin favors hypertensive etiology. Electronically Signed   By: Monte Fantasia M.D.   On: 03/27/2018 14:49   Dg Chest Port 1 View  Result Date: 04/21/2018 CLINICAL DATA:  Shortness of breath EXAM: PORTABLE CHEST 1 VIEW COMPARISON:  04/19/2018 FINDINGS: Tracheostomy. Right PICC line with tip over the low SVC region. No pneumothorax. Enteric tube tip is off  the field of view but below the left hemidiaphragm. Shallow inspiration. Cardiac enlargement with perihilar infiltrates likely due to edema. Small bilateral pleural effusions. Degenerative changes in the spine and shoulders. IMPRESSION: Appliances appear in satisfactory position. Cardiac enlargement with perihilar edema and small bilateral pleural effusions. Electronically Signed   By: Lucienne Capers M.D.   On: 04/21/2018 04:28   Dg Chest Port 1 View  Result Date: 04/19/2018 CLINICAL DATA:  Fever.  Tracheostomy. EXAM: PORTABLE CHEST 1 VIEW COMPARISON:  04/18/2018 FINDINGS: Enteric tube present as tip not visualized. Tracheostomy tube unchanged. Right-sided PICC line unchanged with tip over the SVC. Lungs are somewhat hypoinflated demonstrate the slight interval worsening hazy perihilar opacification likely interstitial edema. Infection over the right midlung is possible. Possible small amount left pleural fluid. Stable cardiomegaly. Remainder of the exam is unchanged. IMPRESSION: Stable cardiomegaly with slight interval worsening hazy perihilar opacification suggesting interstitial edema. Infection over the right midlung is possible. Possible small amount left pleural  fluid. Tubes and lines as described. Electronically Signed   By: Marin Olp M.D.   On: 04/19/2018 15:33   Dg Chest Port 1 View  Result Date: 04/18/2018 CLINICAL DATA:  Short of breath EXAM: PORTABLE CHEST 1 VIEW COMPARISON:  04/16/2018 FINDINGS: Tubular devices are stable. Right infrahilar and left basilar consolidation are stable. Low volumes. No pneumothorax. IMPRESSION: Stable bilateral consolidation. Electronically Signed   By: Marybelle Killings M.D.   On: 04/18/2018 18:39   Dg Chest Port 1 View  Result Date: 04/16/2018 CLINICAL DATA:  Aspiration pneumonia and vomiting. EXAM: PORTABLE CHEST 1 VIEW COMPARISON:  April 16, 2018 7:06 a.m. FINDINGS: The heart size and mediastinal contours are stable. The heart size is enlarged.  Tracheostomy tube is unchanged. Right central venous line is unchanged. At the upper of tube is identified distal tip not included on film. Patchy consolidation of the left lung base and right infrahilar region are noted. There is mild interstitial edema. The visualized skeletal structures are stable. IMPRESSION: Patchy opacity of the left lung base and right infrahilar region suspicious for pneumonia. Mild interstitial edema. Electronically Signed   By: Abelardo Diesel M.D.   On: 04/16/2018 15:30   Dg Chest Port 1 View  Result Date: 04/16/2018 CLINICAL DATA:  Shortness of breath EXAM: PORTABLE CHEST 1 VIEW COMPARISON:  April 13, 2018 FINDINGS: Tracheostomy catheter tip is 4.6 cm above the carina. Feeding tube tip is below the diaphragm. Central catheter tip is in the superior vena cava. No pneumothorax. There is patchy airspace opacity in the right lower lobe. There is a small left pleural effusion with medial left base atelectasis. Heart is upper normal in size with pulmonary vascularity normal. No adenopathy. No bone lesions. IMPRESSION: Tube and catheter positions as described without pneumothorax. Patchy airspace opacity, likely pneumonia, right base. Atelectasis medial left base with small left pleural effusion. Stable cardiac prominence. Electronically Signed   By: Lowella Grip III M.D.   On: 04/16/2018 10:03   Dg Chest Port 1 View  Result Date: 04/13/2018 CLINICAL DATA:  Fever EXAM: PORTABLE CHEST 1 VIEW COMPARISON:  April 11, 2018 FINDINGS: The feeding tube terminates below today's film. Stable tracheostomy tube. Stable right PICC line in good position. Stable cardiomegaly. Small effusion and atelectasis in the left base. No other interval changes. IMPRESSION: Support apparatus as above. Tiny left effusion with associated atelectasis. No other change. Electronically Signed   By: Dorise Bullion III M.D   On: 04/13/2018 12:20   Dg Chest Port 1 View  Result Date: 04/11/2018 CLINICAL DATA:   Tracheostomy. EXAM: PORTABLE CHEST 1 VIEW COMPARISON:  04/10/2018. FINDINGS: Tracheostomy tube noted with tip over the trachea. Right PICC line stable position. Cardiomegaly. Bibasilar atelectasis. Bibasilar infiltrates/edema. Small left pleural effusion. IMPRESSION: 1. Tracheostomy tube in od anatomic position. Right PICC line noted with tip in stable position. 2.  Stable cardiomegaly. 3. Bibasilar atelectasis. Bibasilar infiltrates/edema. Small left pleural effusion. No pneumothorax. Electronically Signed   By: Marcello Moores  Register   On: 04/11/2018 10:38   Dg Chest Port 1 View  Result Date: 04/10/2018 CLINICAL DATA:  Intubation. EXAM: PORTABLE CHEST 1 VIEW COMPARISON:  Chest radiograph April 09, 2018 FINDINGS: Endotracheal tube tip projects 7.9 cm above the carina, above the level of the clavicle. Stable RIGHT PICC distal tip projecting in distal superior vena cava. Interval removal of nasogastric tube. Low inspiratory examination with small pleural effusions and bibasilar strandy densities. Mild cardiomegaly. Calcified aortic arch. No pneumothorax. Soft tissue planes and included osseous  structures are non suspicious. IMPRESSION: Endotracheal tube tip projects 7.9 cm above the carina, recommend 3 cm advancement. Stable RIGHT PICC. Interval removal of nasogastric tube. Stable cardiomegaly with small pleural effusions and bibasilar atelectasis/scarring. Aortic Atherosclerosis (ICD10-I70.0). Electronically Signed   By: Elon Alas M.D.   On: 04/10/2018 18:45   Dg Chest Port 1 View  Result Date: 04/09/2018 CLINICAL DATA:  Respiratory failure. EXAM: PORTABLE CHEST 1 VIEW COMPARISON:  04/08/2018 FINDINGS: Endotracheal tube tip 4.1 cm above the carina. Right PICC line tip: Lower SVC. A nasogastric tube enters the stomach. Stable bilateral lower lobe airspace opacities. The lungs appear otherwise clear. Atherosclerotic calcification of the aortic arch. Degenerative glenohumeral arthropathy bilaterally. No  pneumothorax. IMPRESSION: 1. Stable bibasilar airspace opacities. Tubes and lines remain satisfactorily position. 2.  Aortic Atherosclerosis (ICD10-I70.0). Electronically Signed   By: Van Clines M.D.   On: 04/09/2018 10:33   Dg Chest Port 1 View  Result Date: 04/08/2018 CLINICAL DATA:  Respiratory failure /ETT,HX COPD,CHF EXAM: PORTABLE CHEST 1 VIEW COMPARISON:  04/07/2018 FINDINGS: Endotracheal tube tip 2.8 cm above the carina. Right PICC line tip: Lower SVC. Nasogastric tube enters the stomach. The patient is rotated to the right on today's radiograph, reducing diagnostic sensitivity and specificity. Continued bandlike opacity at the right lung base with some adjacent volume loss, favoring atelectasis. Indistinct retrocardiac airspace opacity, stable, with some obscuration of the left hemidiaphragm. Borderline enlargement of the cardiopericardial silhouette. No edema. IMPRESSION: 1. Stable bibasilar airspace opacities. The right basilar airspace opacity favors atelectasis. The left lower lobe airspace opacity is nonspecific. 2. Borderline enlargement of the cardiopericardial silhouette. Electronically Signed   By: Van Clines M.D.   On: 04/08/2018 07:16   Dg Chest Port 1 View  Result Date: 04/07/2018 CLINICAL DATA:  77 year old female status post endotracheal tube revision EXAM: PORTABLE CHEST 1 VIEW COMPARISON:  Prior radiograph earlier this morning at 9:38 a.m. FINDINGS: The tip of the endotracheal tube is now 2.6 cm above the carina. Unchanged position of right PICC with the tip overlying the superior cavoatrial junction. The gastric tube is also unchanged, the tip lies below the field of view presumably within the stomach. Stable cardiomegaly and left greater than right base basilar atelectasis. No acute osseous abnormality. IMPRESSION: Successful repositioning of endotracheal tube. The endotracheal tube tip is now 2.6 cm above the carina. Electronically Signed   By: Jacqulynn Cadet M.D.    On: 04/07/2018 11:36   Dg Chest Port 1 View  Result Date: 04/07/2018 CLINICAL DATA:  77 year old female with respiratory failure, intubated EXAM: PORTABLE CHEST 1 VIEW COMPARISON:  Prior chest x-ray 04/06/2018 FINDINGS: The patient remains intubated. The tip of the endotracheal tube is 1.4 cm above the carina. Right upper extremity PICC with the tip overlying the cavoatrial junction. A gastric tube is present. The tip is not identified but lies below the diaphragm, presumably within the stomach. Stable cardiomegaly. Atherosclerotic calcifications in the transverse aorta. Slightly improved inspiratory volumes with persistent but decreasing left greater than right basilar atelectasis. No acute osseous abnormality. IMPRESSION: 1. Stable and satisfactory support apparatus. 2. Improved inspiratory volumes with decreasing bibasilar atelectasis. 3. Stable cardiomegaly. 4.  Aortic Atherosclerosis (ICD10-170.0). Electronically Signed   By: Jacqulynn Cadet M.D.   On: 04/07/2018 10:29   Dg Chest Port 1 View  Result Date: 04/06/2018 CLINICAL DATA:  Respiratory failure EXAM: PORTABLE CHEST 1 VIEW COMPARISON:  04/05/2018 FINDINGS: Endotracheal tube close to the carina at 1.8 cm. No change. NG tube extends the stomach. Interval  placement of RIGHT PICC line with tip in distal SVC. RIGHT basilar atelectasis.  No pulmonary edema.  No pneumothorax. IMPRESSION: 1. Stable support apparatus.  Low endotracheal tube unchanged. 2. RIGHT PICC line with tip in distal SVC. Electronically Signed   By: Suzy Bouchard M.D.   On: 04/06/2018 08:47   Dg Chest Port 1 View  Result Date: 04/05/2018 CLINICAL DATA:  Respiratory failure EXAM: PORTABLE CHEST 1 VIEW COMPARISON:  Two days ago FINDINGS: Endotracheal tube tip 1 cm above the carina. An orogastric tube reaches the stomach. Haziness of the lower chest with streaky density. No edema, effusion, or pneumothorax. Chronic cardiomegaly. IMPRESSION: 1. Stable hardware positioning.  Endotracheal tube tip is 1 cm above the carina. 2. Low lung volumes with streaky opacities at the bases favoring atelectasis. Aspiration and infection is not excluded. Electronically Signed   By: Monte Fantasia M.D.   On: 04/05/2018 08:29   Dg Chest Port 1 View  Result Date: 04/03/2018 CLINICAL DATA:  Acute respiratory failure EXAM: PORTABLE CHEST 1 VIEW COMPARISON:  04/03/2018 FINDINGS: Endotracheal tube and NG tube are unchanged. Cardiomegaly with vascular congestion. Small bilateral effusions with bibasilar atelectasis. IMPRESSION: Cardiomegaly with vascular congestion. Small effusions with bibasilar atelectasis. No change. Electronically Signed   By: Rolm Baptise M.D.   On: 04/03/2018 19:57   Dg Chest Port 1 View  Result Date: 04/03/2018 CLINICAL DATA:  Acute respiratory failure EXAM: PORTABLE CHEST 1 VIEW COMPARISON:  04/02/2018 FINDINGS: Cardiac shadow is stable. Endotracheal tube and nasogastric catheter are again seen and stable. Bibasilar atelectatic changes are noted slightly increased when compare with the prior exam. No sizable effusion or pneumothorax is seen. No bony abnormality is noted. IMPRESSION: Increasing bibasilar atelectatic changes. Electronically Signed   By: Inez Catalina M.D.   On: 04/03/2018 10:55   Dg Chest Port 1 View  Result Date: 04/02/2018 CLINICAL DATA:  77 year old female with a history of endotracheal tube placement EXAM: PORTABLE CHEST 1 VIEW COMPARISON:  04/02/2018, 04/01/2018 FINDINGS: Cardiomediastinal silhouette unchanged in size and contour. Right rotation somewhat accentuates the heart border. Endotracheal tube terminates approximately 2.0 cm above the carina, slightly withdrawn from the prior. Gastric tube projects over the mediastinum, terminating out of the field of view. Low lung volumes. No large pleural effusion. Similar appearance of patchy airspace opacities at the lung bases. No pneumothorax. IMPRESSION: Endotracheal tube terminates approximately 2.0 cm  by of the carina, slightly withdrawn from the prior. Unchanged gastric tube. Similar appearance of low lung volumes and patchy opacities at the lung bases, likely atelectasis. Electronically Signed   By: Corrie Mckusick D.O.   On: 04/02/2018 16:18   Dg Chest Port 1 View  Result Date: 04/02/2018 CLINICAL DATA:  Acute respiratory failure with hypoxia. EXAM: PORTABLE CHEST 1 VIEW COMPARISON:  Radiograph of April 01, 2018. FINDINGS: Stable cardiomegaly. Endotracheal and nasogastric tubes are unchanged in position. No pneumothorax is noted. Stable bibasilar opacities are noted most consistent with atelectasis, right greater than left. Bony thorax is unremarkable. IMPRESSION: Stable support apparatus.  Stable bibasilar atelectasis. Electronically Signed   By: Marijo Conception, M.D.   On: 04/02/2018 11:31   Dg Chest Port 1 View  Result Date: 04/01/2018 CLINICAL DATA:  Ventilator support EXAM: PORTABLE CHEST 1 VIEW COMPARISON:  03/31/2018 FINDINGS: Endotracheal tube tip is 2.5 cm above the carina. Nasogastric tube enters the stomach. Atelectasis in both lower lobes appears similar. Upper lungs remain clear. IMPRESSION: Endotracheal tube and nasogastric tube unchanged in well-positioned. Persistent atelectasis in both  lower lobes. Electronically Signed   By: Nelson Chimes M.D.   On: 04/01/2018 09:08   Dg Chest Port 1 View  Result Date: 03/31/2018 CLINICAL DATA:  Endotracheal tube EXAM: PORTABLE CHEST 1 VIEW COMPARISON:  03/30/2018 FINDINGS: Endotracheal tube in good position. NG tube in the stomach. Progression of bibasilar atelectasis. Negative for heart failure or edema or effusion. IMPRESSION: Endotracheal tube in good position. Progression of bibasilar atelectasis. Electronically Signed   By: Franchot Gallo M.D.   On: 03/31/2018 08:04   Dg Chest Port 1 View  Result Date: 03/30/2018 CLINICAL DATA:  Endotracheal tube placement. EXAM: PORTABLE CHEST 1 VIEW COMPARISON:  Radiograph of March 28, 2018. FINDINGS:  Stable cardiomediastinal silhouette. Endotracheal and nasogastric tubes are unchanged in position. No pneumothorax or pleural effusion is noted. Right lung is clear. Mild left basilar subsegmental atelectasis is noted. Bony thorax is unremarkable. IMPRESSION: Mild left basilar subsegmental atelectasis. Stable support apparatus. Electronically Signed   By: Marijo Conception, M.D.   On: 03/30/2018 07:26   Dg Chest Port 1 View  Result Date: 03/28/2018 CLINICAL DATA:  Respiratory failure. EXAM: PORTABLE CHEST 1 VIEW COMPARISON:  03/27/2018. FINDINGS: Endotracheal tube tip noted at the tracheal bifurcation 1 cm above the lower portion of the carina. Proximal repositioning of approximately 3 cm suggested. NG tube noted with its tip below left hemidiaphragm. Stable cardiomegaly. Low lung volumes. Small left pleural effusion cannot be excluded. No pneumothorax. IMPRESSION: 1. Endotracheal tube tip noted at the tracheal bifurcation 1 cm above the lower portion of the carina, proximal repositioning of approximately 3 cm suggested. NG tube noted with its tip below left hemidiaphragm. 2.  Stable cardiomegaly.  No pulmonary venous congestion. 3.  Low lung volumes.  Small left pleural effusion. Critical Value/emergent results were called by telephone at the time of interpretation on 03/28/2018 at 6:49 am to nurse Roselyn Reef, who verbally acknowledged these results. Electronically Signed   By: Marcello Moores  Register   On: 03/28/2018 06:50   Dg Chest Portable 1 View  Result Date: 03/27/2018 CLINICAL DATA:  Post intubation EXAM: PORTABLE CHEST 1 VIEW COMPARISON:  Portable exam 2027 hours compared to 09/28/2016 FINDINGS: Tip of endotracheal tube projects 1.5 cm above carina. Nasogastric tube extends into stomach. Minimal enlargement of cardiac silhouette. Mediastinal contours and pulmonary vascularity normal. Lungs clear. No pleural effusion or pneumothorax. Bones demineralized. IMPRESSION: Minimal enlargement of cardiac silhouette.  Satisfactory tube positions as above. Electronically Signed   By: Lavonia Dana M.D.   On: 03/27/2018 20:45   Dg Abd Portable 1v  Result Date: 04/16/2018 CLINICAL DATA:  Vomiting EXAM: PORTABLE ABDOMEN - 1 VIEW COMPARISON:  April 03, 2018 FINDINGS: The bowel gas pattern is normal. A feeding tube is identified with the distal tip in the distal stomach. Right hip replacement is identified. IMPRESSION: No bowel obstruction. Feeding tube is identified with distal tip in distal stomach. Electronically Signed   By: Abelardo Diesel M.D.   On: 04/16/2018 15:31   Dg Abd Portable 1v  Result Date: 04/03/2018 CLINICAL DATA:  NG tube placement EXAM: PORTABLE ABDOMEN - 1 VIEW COMPARISON:  None. FINDINGS: NG tube tip is in the distal stomach near the pylorus. Bibasilar atelectasis. Gas within mildly distended colon. IMPRESSION: NG tube tip in the distal stomach. Electronically Signed   By: Rolm Baptise M.D.   On: 04/03/2018 21:28   [place] Ekg Site Rite  Result Date: 04/05/2018 If Site Rite image not attached, placement could not be confirmed due to current cardiac rhythm.  PHYSICAL EXAM      Per [place] - Well nourished, well developed, on trach collar. Cardiovascular - Regular rate and rhythm. Skin - R FA edema resolved, Skin above trach reddened and sore to touch - unchanged from yesterday -still with redness and edema.  No skin breakdown. Trach collar not tight. Crusting noted. Abd Soft and nondistended.   Core track in place without redness/breakdown. EEG leads in place  Neuro - on trach collar, eyes open on voice, wave right hand to greet provider today. Mouth words appropriately. Pupils are equal and reactive to light. left gaze preference. Blinking to visual threat on the left but not on the right. Slight Right facial droop. MAE x 4 against gravity BUE 3+/5 and BLE 3/5. babinski negative bilaterally. Sensation intact. Coordination not cooperative and gait not tested.     ASSESSMENT/PLAN Amber Rogers is a 77 y.o. female with history of COPD, CHF, HTN, HLD, CKD III, CAD s/p stent presenting with sudden onset nausea, vomiting and dizziness.  Overall, poor neuro progression with ongoing medical complications. Palliative care involved. Dtrs leaning toward comfort care. 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  Mild hydrocephalus lateral ventricles.    CTA head no vascular malformation or abnormal enhancement.  Stable cerebellar and IVH.  No LVO.  Intracranial atherosclerosis.  Repeat CT x 3 - Stable volume of intracranial hemorrhage and hydrocephalus  CUS - Significant calcific plaque bilateral bifurcations, however, velocities are not significantly increased.  2D Echo EF 60-65%  EEG negative for seizure  UDS neg  LDL 66  HgbA1c 5.5  Heparin subq for VTE prophylaxis  aspirin 81 mg daily and Brilinta 90 mg twice daily prior to admission, now on no antithrombotics d/t hmg  Therapy recommendations:  CIR  Disposition:  Pending. Would likely be a good LTAC candidate if family do not want to pursue palliative care  Palliative consult for long-term care options - made DNR on Friday - f/u meeting scheduled for Tuesday.   Lethargy  On provigil 200 mg daily and amantadine 100 twice daily  Ongoing intermittent lethargy and AMS  LTM EEG place to rule out seizure showed slowing  Obstructve hydrocephalus s/p R frontal EVD   EVD placed 8/22, replaced 8/28  CT showed recurrent hydro prior to EVD replacement. Repeat CT head Monday pending.  Repeat CT stable after EVD replacement  Repeat CT stable 9/5 after 48h of EVD  clamping  EVD removed on 04/10/18  NSG signed off  CT repeat 04/12/18 and 9/15 stable mild hydrocephalus  Removed staples from head  MSSA Pneumonia Possible intermittent aspiration. Tracheobronchitis Leukocytosis Respiratory distress   T Max  99.3->101.3 ->100.1->99.2  UA neg for UTI,   Blood culture NNGTD  Leukocytosis  15  On home prednisone for COPD  Sputum Cx +MSSA  Abx changed to vanco/zosyn on 9/14 by CCM  Repeat CXR 9/14 infection over right midlung, worsening interstitial edema  Tracheal aspirate culture sent - FEW STAPHYLOCOCCUS AUREUS   CCM on board  Carotid stenosis   CUS 08/2017 R ICA stenosis 50-69%, L ICA < 50%  CUS repeat showed b/l ICA 1-39% stenosis but significant calcified plaque at carotid bifurcation  bilaterally.   Acute Hypoxic Respiratory failure - extubation and re-intubation S/p Trach placement  Intubated in the ED with neuro decline   Extubated and re-intubated 04/03/18  Trach done 04/11/18  Currently on trach collar  Trach suture removed   Treated with morphine for respiratory distress.   Dysphagia secondary to stroke  NPO on tube feeds.   Tube feeds changed from bolus to continuous drip d/t difficulty pushing through coretrak tube (small caliber)  Hypertensive Emergency  Treated with cardene in ICU  Home Meds: Norvasc 10, resumed  Currently on norvasc 10 and increase hydralazine to 75 Q8   Add lisinopril 20 mg bid   SBP goal < 160  BP occasionally over 160  AKI  Cre 0.87->1.02->1.11  On free water 33cc/h and TF @ 40cc/h  Na 143->141  Continue monitoring  Hyperlipidemia  Home meds:  ?? Lipitor 80   LDL 66  Hold statin for now. Recheck in 6-8 weeks. If remains low, no statin needed. If elevated, resume statin.   Other Stroke Risk Factors  Advanced age  Former Cigarette smoker, quit 2 years ago  Coronary artery disease s/p stent - on ASA and brilinta PTA  Chronic Congestive heart failure  Anemia,  microcytic   Essentially stable this admission 7.8->6.9->10->9.0->10.5->9.4 -> 8.8->9.0  Last documented 11.0 Feb 2018  2u PRBC 9/9  check stool guiac positive   Iron 130, Fer 94, VB12 403, lactate 166, haptoglobin 413, Folate 14.8, FE studies 94   On iron tab 325mg  bid  Other Active Problems  Hx vertigo   COPD - on prednisone, weaning underway   Hypokalemia 3.2->3.4->3.3 ->3.7 - supplement  Hospital day # 26   The patient continues to do poorly because of medical issues and neurologically she is not making great recovery. Unfortunately she needs PEG tube if medical care is to continue and family is struggling with the decision. Palliative care team is following and hopefully will make a decision in the next 1-2 days., Discussion with the patient and her 2 daughters and granddaughter at the bedside and with Dr. Hilma Favors and answered questions about her care. Greater than 50% time during this 25 minute visit was spent on counseling and coordination of care about her condition and discussion with care team Antony Contras, MD Medical Director [place] Pager: [phone] 04/22/2018 2:13 PM  To contact Stroke Continuity provider, please refer to [email or web address] After hours, contact General Neurology

## 2018-04-22 NOTE — Progress Notes (Signed)
Palliative Care Follow-up/Family Meeting  Met with patient's two daughter and her sister. Patient aspirated last PM, has not been responsive since that time. S/p ICH/stroke 4 weeks with minimal improvement.  Family has now decided on full comfort care. I discuss possible EOL trajectory and provided information on EOL care including comfort feeding and symptom management.  Prognosis: hours-days, <week unless condition changes unexpectedly  Recommendations:  1. Full comfort care 2. Hospital death vs. [place] transfer-will assess stability in AM but initiate referral 3. Wean down O2 as tolerated and give opioid prns for discomfort 4. Remove Cortrak, comfort feeding if altert and is able to request PO only, otherwise mouth care' 5. Continue bronchodilators 6. Stop antibiotics 7. KVO fluids only, if volume overloaded can give prn lasix 8. Maintain foley for comfort 9. Morphine 39m IV q30 min PRN, if consecutive does needed will start infusion 10. Ativan PRN for agitation or seizure  Discussed plan of care with Dr. SLeonie Man Unit RN, and family who are all in agreement with how to proceed.  ELane Hacker DO Palliative Medicine [phone] Time: 348m Greater than 50%  of this time was spent counseling and coordinating care related to the above assessment and plan.

## 2018-04-22 NOTE — Progress Notes (Signed)
Nutrition Follow-up  DOCUMENTATION CODES:   Not applicable  INTERVENTION:  Continue Osmolite 1.5 formula via Cortrak NGT (pending goals of care) at goal rate of 40 ml/hr.  Continue 30 ml Prostat once daily per tube.   Continue free water flushes of 100 ml every 6 hours.   Tube feeding regimen to provide 1540 kcal, 85 grams of protein, 1130 ml water.   NUTRITION DIAGNOSIS:   Inadequate oral intake related to acute illness as evidenced by NPO status; ongoing  GOAL:   Patient will meet greater than or equal to 90% of their needs; met with TF  MONITOR:   TF tolerance, Labs, Skin, Weight trends, I & O's  REASON FOR ASSESSMENT:   Ventilator    ASSESSMENT:   77 yo female admitted with deep left cerebellar hemorrhage with 3rd and 4th ventricle extension and mild hydrocephalus. Pt required intubation for airway protection. Pt with hx of HTN, CKD 8/22 - R frontal ventriculostomy. 9/6- tracheostomy placed, gastric cortrak placed.  Pt with increased need of oxygen in respiratory distress. PT with poor neuro progression. Palliative care has been consulted. Noted pt continues to refuse PEG. Family to make final decision regarding PEG and goals of care. Plans to continue current tube feeding regimen until goals of cares established. RD to continue to monitor. Labs and medications reviewed.   Diet Order:   Diet Order            Diet NPO time specified  Diet effective midnight              EDUCATION NEEDS:   No education needs have been identified at this time  Skin:  Skin Assessment: Reviewed RN Assessment  Last BM:  9/16  Height:   Ht Readings from Last 1 Encounters:  04/08/18 5' 6"  (1.676 m)    Weight:   Wt Readings from Last 1 Encounters:  04/22/18 70.8 kg    Ideal Body Weight:  59.1 kg  BMI:  Body mass index is 25.19 kg/m.  Estimated Nutritional Needs:   Kcal:  1500-1750  Protein:  75-85 grams  Fluid:  >/= 1.5 L/day    Corrin Parker, MS, RD,  LDN Pager # [phone] After hours/ weekend pager # [phone]

## 2018-04-22 NOTE — Progress Notes (Signed)
PULMONARY / CRITICAL CARE MEDICINE   NAME:  HENRY DEMERITT, MRN:  [id number], DOB:  1940/12/07, LOS: 58 ADMISSION DATE:  03/27/2018, CONSULTATION DATE:  03/27/2018 REFERRING MD:  Dr. Leonel Ramsay, Neurology, CHIEF COMPLAINT:  Headache  HISTORY OF PRESENT ILLNESS:   77 yo female former smoker presented with HA, N/V and noted to be hypertensive.  CT head showed hematoma in Lt cerebellum with hydrocephalus.  Required intubation for airway protection.  She had EVD by neurosurgery.  Failed extubation attempts on 8/28 and 9/05 due to stridor.  Had tracheostomy done on 9/06.  PAST MEDICAL HISTORY: CAD, CKD 3, Diastolic CHF, HLD  SIGNIFICANT EVENTS: 8/22 Admit, EVD placed 8/26 Fever, add ABx 8/28 replaced EVD (clogged) 8/29 failed extubation (stridor) 9/05 failed extubation (stridor) 9/06 trach 9/08 fever 9/10 transfuse PRBC, increased respiratory secretions >> start ABx 9/11 vomited feels more short of breath   STUDIES:  CT head 8/22 >> 2 cm hematoma Lt cerebellum with extension to 4th ventricle, mild hydrocephalus Echo 8/25 >> mild LVH, EF 60 to 65%, mild AS  CULTURES: Sputum 8/26 >> oral flora Blood 8/26 >> negative CSF 8/29 >> negative Sputum 9/03 >> Candida Blood 9/08 >>  Sputum 9/08 >> Few MSSA  ANTIBIOTICS: Cefepime 8/26 >> 9/01 Vancomycin 8/26 >> 9/01>>>9/16 Ancef 9/10 >> 9/16 Zosyn 9/16>>>  LINES/TUBES: Trach 9/06 >>   CONSULTANTS: Neurosurgery 8/22 hydrocephalus >> CIR 9/10 >>   SUBJECTIVE: Called back with increase FiO2 demand and fever overnight, fever resolved but O2 demand continues to be high  CONSTITUTIONAL: BP (!) 143/51   Pulse 73   Temp 99 F (37.2 C) (Axillary)   Resp 16   Ht 5\' 6"  (1.676 m)   Wt 70.8 kg   SpO2 92%   BMI 25.19 kg/m   I/O last 3 completed shifts: In: 2342.4 [NG/GT:1768.7; IV Piggyback:573.8] Out: 3600 [Urine:3600]  FiO2 (%):  [60 %-80 %] 80 %  PHYSICAL EXAM:  General: Chronically ill appearing female, NAD on high flow O2 via   HEENT: [place]/AT, PERRL, EOM-I and MMM Pulmonary: Coarse BS diffusely and transmitted upper airway sounds Cardiac: IRIR, Nl S1/S2 and -M/R/G Extremities: -edema and -tenderness Abdomen: Soft, NT, ND and +BS Neuro: Awake and interactive  I reviewed CXR myself, trach is in a good position  ASSESSMENT/PLAN:  Compromised airway in setting of ICH. Post extubation stridor. S/p tracheostomy. Plan Routine trach care May need bronch if unable to clear secretions as below Vibravest Aspiration precautions Frequent suction Maintain current trach size and type given evolving respiratory failure, no change to cuffless  Acute on chronic hypoxemic failure due to fluid overload and likely aspiration Plan: Aspiration precautions Titrate O2 for sat of 88-92% See below  Acute pulmonary edema: Plan: Lasix 40 mg IV q8 x2 doses Strict I/O BMET in AM  COPD. 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 Plan Cont rehab efforts  Dysphagia. Plan SLP No PV Cont tubefeeds Family needs to decide on PEG  Acute MSSA tracheobronchitis versus pneumonia and aspiration Plan: Continue vanc/zosyn for now Check PCT  Hypertension. Plan SBP goal <160 Cont norvasc & hydralazine  CKD 2. Plan Trend renal fxn   Anemia. Normocytic/normochromic  -transfused 9/10 -no evidence of bleeding Plan Trend cbc  Patient is rapidly deteriorating with acute on chronic hypoxemic respiratory failure with mucous plugging, pulmonary edema and aspiration.  Unfortunately we are at the point where we have to make some decision about plan  of care.  The patient continues to refuse PEG and she is obviously aspirating.  There is a mixed picture of whether the patient would like to do with her care versus not.  The patient sister and daughter were bedside.  After a long discussion, they relay that the patient's daughter were the one to  make these kinds of decision that they are not available right now.  They did relay however that vent is out of the question.  We will proceed with vibravest and 2 doses of lasix and plan to meet again with the patient's daughters to make some final decision as they are not available right now.  LABS:  Glucose Recent Labs  Lab 04/21/18 1710 04/21/18 1930 04/21/18 2340 04/22/18 0359 04/22/18 0838 04/22/18 1155  GLUCAP 178* 161* 149* 167* 122* 99    BMET Recent Labs  Lab 04/20/18 0652 04/21/18 0513 04/22/18 0601  NA 142 143 143  K 3.3* 3.7 3.7  CL 105 102 99  CO2 30 27 34*  BUN 22 25* 24*  CREATININE 1.01* 1.11* 1.33*  GLUCOSE 133* 183* 172*    Liver Enzymes No results for input(s): AST, ALT, ALKPHOS, BILITOT, ALBUMIN in the last 168 hours.  Electrolytes Recent Labs  Lab 04/20/18 0652 04/21/18 0513 04/22/18 0601  CALCIUM 7.8* 8.1* 8.1*    CBC Recent Labs  Lab 04/20/18 0652 04/21/18 0513 04/22/18 0601  WBC 11.9* 15.0* 16.9*  HGB 8.8* 9.0* 9.2*  HCT 29.6* 30.7* 31.0*  PLT 372 400 458*    ABG No results for input(s): PHART, PCO2ART, PO2ART in the last 168 hours.  Coag's No results for input(s): APTT, INR in the last 168 hours.  Sepsis Markers No results for input(s): LATICACIDVEN, PROCALCITON, O2SATVEN in the last 168 hours.  Cardiac Enzymes No results for input(s): TROPONINI, PROBNP in the last 168 hours.  Imaging Ct Head Wo Contrast  Result Date: 04/22/2018 CLINICAL DATA:  77 year old female status post cerebellar hemorrhage in August. No purposeful movements. EXAM: CT HEAD WITHOUT CONTRAST TECHNIQUE: Contiguous axial images were obtained from the base of the skull through the vertex without intravenous contrast. COMPARISON:  Head CT without contrast 04/20/2018 and earlier. 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 Stable hypodensity in the left central cerebellum  abutting the 4th ventricle with no significant regional mass effect. Patent basilar cisterns. No residual posterior fossa blood products identified. Stable gray-white matter differentiation elsewhere, patchy bilateral cerebral white matter hypodensity. No midline shift or intracranial mass effect. Vascular: Calcified atherosclerosis at the skull base. Skull: Stable.  No acute osseous abnormality identified. Sinuses/Orbits: Motion artifact at the skull base. Left nasoenteric tube remains in place. Paranasal sinuses are stable and well pneumatized. Stable mild bilateral mastoid effusions and left tympanic cavity opacification. Other: No acute orbit or scalp soft tissue findings; mild postoperative changes from right frontal EVD. IMPRESSION: 1. Stable CT appearance of the brain since 04/20/2018: Rounded area of hypodensity in the left cerebellum without significant mass effect. Trace residual IVH without ventriculomegaly. 2. Mastoid effusions and left middle ear opacification. Electronically Signed   By: Genevie Ann M.D.   On: 04/22/2018 09:54   Discussed with PCCM-NP.  The patient is critically ill with multiple organ systems failure and requires high complexity decision making for assessment and support, frequent evaluation and titration of therapies, application of advanced monitoring technologies and extensive interpretation of multiple databases.   Critical Care Time devoted to patient care services  described in this note is  32  Minutes. This time reflects time of care of this signee Dr Jennet Maduro. This critical care time does not reflect procedure time, or teaching time or supervisory time of PA/NP/Med student/Med Resident etc but could involve care discussion time.  Rush Farmer, M.D. [facility] Pulmonary/Critical Care Medicine. Pager: [phone]. After hours pager: [phone].

## 2018-04-23 DIAGNOSIS — I69391 Dysphagia following cerebral infarction: Secondary | ICD-10-CM

## 2018-04-23 NOTE — Progress Notes (Addendum)
STROKE TEAM PROGRESS NOTE   INTERVAL HISTORY Family at bedside - dtr and granddaughter. They met with palliative care and made her comfort care last night. They are adjusting to their decision. They definitely feel pt looks for peaceful and comfortable today.   Vitals:   04/23/18 0320 04/23/18 0840 04/23/18 0907 04/23/18 1125  BP:  (!) 157/65    Pulse: 86 78  82  Resp: 14   16  Temp:      TempSrc:      SpO2: (!) 89%  93% 93%  Weight:      Height:         PHYSICAL EXAM       General - Well nourished, well developed, on trach collar. Cardiovascular - Regular rate and rhythm. Skin - Skin above trach reddened and sore to touch - still with redness and edema.  Trach collar not tight. Dressing CD&I. Abd Soft and nondistended.   Neuro - on trach collar, eyes open on voice, lethargic. Pupils are equal and reactive to light. left gaze preference. Blinking to visual threat on the left but not on the right. Slight Right facial droop. MAE x 4 against gravity BUE 3+/5 and BLE 3/5. babinski negative bilaterally. Sensation intact. Coordination not cooperative and gait not tested.     ASSESSMENT/PLAN Amber Rogers is a 77 y.o. female with history of COPD, CHF, HTN, HLD, CKD III, CAD s/p stent presenting with sudden onset nausea, vomiting and dizziness.  Overall, poor neuro progression with ongoing medical complications. Palliative care involved. Dtrs decided for comfort care 9/17. Looking at residential Hospice options.   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 Hospital day #27.  Family frustrated with lack of improvement and concern she would not want long-term feeding tube and SNF care  DNR after meeting with palliative care 9/13.   Family decided on full comfort care 9/17  Disposition:  Pending. family interested in residential hospice placement   Lethargy  Obstructve hydrocephalus,  stable  MSSA Pneumonia Possible intermittent aspiration Tracheobronchitis Leukocytosis Respiratory distress   Carotid stenosis   CUS 08/2017 R ICA stenosis 50-69%, L ICA < 50%  CUS repeat showed b/l ICA 1-39% stenosis but significant calcified plaque at carotid bifurcation bilaterally.   Acute Hypoxic Respiratory failure - extubation and re-intubation S/p Trach placement  Extubated and re-intubated 04/03/18  Trach done 04/11/18  Currently on trach collar  Dysphagia secondary to stroke  Hypertensive Emergency, resolved  AKI  Hyperlipidemia  Other Stroke Risk Factors  Advanced age  Former Cigarette smoker, quit 2 years ago  Coronary artery disease s/p stent - on ASA and brilinta PTA  Chronic Congestive heart failure  Anemia, microcytic   Other Active Problems  Hx vertigo   COPD  Hypokalemia  Hospital day # [place], MSN, APRN, ANVP-BC, AGPCNP-BC Advanced Practice Stroke Nurse [place] for Schedule & Pager information 04/23/2018 2:45 PM  I have personally examined this patient, reviewed notes, independently viewed imaging studies, participated in medical decision making and plan of care.ROS completed by me personally and pertinent positives fully documented  I have made any additions or clarifications directly to the above note. Agree with note above.I had a long discussion with the patient's daughter and granddaughter at the bedside and answered questions. They seemed comfortable with her decision on comfort care. Plan to transfer to hospice nursing home when bed  available over the next few days Amber Contras, MD Medical Director [place] Pager: [phone] 04/23/2018 2:53 PM  To contact Stroke Continuity provider, please refer to [email or web address] After hours, contact General Neurology

## 2018-04-23 NOTE — Progress Notes (Signed)
Palliative Medicine RN Note: FMLA paperwork completed and returned to family.  Amber Skiff Antonya Leeder, RN, BSN, [facility] Palliative Medicine Team 04/23/2018 2:05 PM Office [phone]

## 2018-04-23 NOTE — Progress Notes (Signed)
CSW following for discharge plan. Noting that plan has changed to comfort care, and MD questioning whether patient could transfer to residential hospice with trach collar. CSW touched base with family at bedside, they would prefer [facility], if possible. CSW sent referral to Admissions at [facility].  CSW to follow.  Laveda Abbe,  Clinical Social Worker [phone]

## 2018-04-23 NOTE — Progress Notes (Signed)
   04/23/18 1300  Clinical Encounter Type  Visited With Patient and family together  Visit Type Initial  Referral From [place] responded to [place] for prayer. Patient was alert and she had 3 family members at her bedside. Chaplain engage in casual conversation with family and they were very pleasant and friendly. They welcomed the communication. Chaplain prayed and provided spiritual/emotional support. Family was grateful for the visit and prayer.

## 2018-04-24 LAB — CULTURE, FUNGUS WITHOUT SMEAR

## 2018-04-24 MED ORDER — POLYVINYL ALCOHOL 1.4 % OP SOLN: 1.0000 [drp] | Freq: Four times a day (QID) | OPHTHALMIC | 0 refills | Status: AC | PRN

## 2018-04-24 MED ORDER — LORAZEPAM 2 MG/ML IJ SOLN: 1.0000 mg | INTRAMUSCULAR | 0 refills | Status: AC | PRN

## 2018-04-24 MED ORDER — ONDANSETRON HCL 4 MG/2ML IJ SOLN: 4.0000 mg | Freq: Four times a day (QID) | INTRAMUSCULAR | 0 refills | Status: AC | PRN

## 2018-04-24 MED ORDER — IPRATROPIUM-ALBUTEROL 0.5-2.5 (3) MG/3ML IN SOLN: 3.0000 mL | Freq: Three times a day (TID) | RESPIRATORY_TRACT | 0 refills | Status: AC

## 2018-04-24 MED ORDER — MORPHINE SULFATE (PF) 2 MG/ML IV SOLN: 2.0000 mg | INTRAVENOUS | 0 refills | Status: AC | PRN

## 2018-04-24 MED ORDER — ACETAMINOPHEN 160 MG/5ML PO SOLN: 650.0000 mg | ORAL | 0 refills | Status: AC | PRN

## 2018-04-24 MED ORDER — ACETAMINOPHEN 650 MG RE SUPP: 650.0000 mg | RECTAL | 0 refills | Status: AC | PRN

## 2018-04-24 MED ORDER — DOCUSATE SODIUM 50 MG/5ML PO LIQD: 100.0000 mg | Freq: Two times a day (BID) | ORAL | 0 refills | Status: AC | PRN

## 2018-04-24 MED ORDER — AMLODIPINE BESYLATE 10 MG PO TABS: 10.0000 mg | ORAL_TABLET | Freq: Every day | ORAL | 0 refills | Status: AC

## 2018-04-24 MED ORDER — ARFORMOTEROL TARTRATE 15 MCG/2ML IN NEBU: 15.0000 ug | INHALATION_SOLUTION | Freq: Two times a day (BID) | RESPIRATORY_TRACT | 0 refills | Status: AC

## 2018-04-24 MED ORDER — BIOTENE DRY MOUTH MT LIQD: 15.0000 mL | OROMUCOSAL | 0 refills | Status: AC | PRN

## 2018-04-24 MED ORDER — GLYCOPYRROLATE 0.2 MG/ML IJ SOLN: 0.2000 mg | INTRAMUSCULAR | 0 refills | Status: AC | PRN

## 2018-04-24 MED ORDER — CHLORHEXIDINE GLUCONATE 0.12 % MT SOLN: 15.0000 mL | Freq: Two times a day (BID) | OROMUCOSAL | 0 refills | Status: AC

## 2018-04-24 MED ORDER — IPRATROPIUM-ALBUTEROL 0.5-2.5 (3) MG/3ML IN SOLN: 3.0000 mL | Freq: Four times a day (QID) | RESPIRATORY_TRACT | 0 refills | Status: AC | PRN

## 2018-04-24 NOTE — Progress Notes (Signed)
Discharge to: [facility] Anticipated discharge date: 04/24/18 Family notified: Yes, at bedside Transportation by: [facility]  Report #: [phone]  [place] signing off.  Laveda Abbe LCSW [phone]

## 2018-04-24 NOTE — Progress Notes (Signed)
Nutrition Brief Note  Chart reviewed. Pt is now comfort care.  No further nutrition interventions warranted at this time.  Please re-consult as needed.   Corrin Parker, MS, RD, LDN Pager # [phone] After hours/ weekend pager # [phone]

## 2018-04-24 NOTE — Plan of Care (Signed)
Adequate for discharge.

## 2018-04-24 NOTE — Progress Notes (Signed)
[facility])  @ 1100  Received request from [place] for family interest in [facility]  Spoke with daughter Darol Destine on (9/18) to confirm interest in [facility], and to make aware there was no bed to offer yesterday.  Met with Jeani Hawking (daughter) and spoke with Darol Destine (daughter) to explain services.  Family agreeable to transfer today.  CSW made aware.  Registration paperwork completed.  Dr. Orpah Melter to assume care per family request.    Please fax discharge summary to [phone]  Nursing staff, please call report to [phone]  Please call with any hospice related questions/concerns  Thank you, Venia Carbon BSN, [facility] Liaison [phone]

## 2018-04-24 NOTE — Discharge Summary (Addendum)
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, hemorrhage likely secondary to hypertension -patient made DO NOT RESUSCITATE and comfort care by family and being transferred to nursing home for hospice care Obstructive Hydrocephalus MSSA Pneumonia  Acute Hypoxic Respiratory failure - extubation and re-intubation S/p Trach placement  Active Problems:   ICH (intracerebral hemorrhage) (HCC)   Cytotoxic brain edema ([place])   Hydrocephalus   Acute respiratory failure requiring reintubation (Amber Rogers)   Intraventricular hemorrhage (H[facility])   Chronic diastolic congestive heart failure ([name])   Benign essential HTN   Hyperlipidemia   Chronic obstructive pulmonary disease ([name])   Coronary artery disease involving native coronary artery of native heart without angina pectoris   Dysphagia, post-stroke   Leukocytosis   Acute blood loss anemia   Hypokalemia   Goals of care, counseling/discussion   Palliative care by specialist   Acute respiratory failure with hypoxia ([name])   Aspiration pneumonia ([name])   Atelectasis   Palliative care encounter   Past Medical History:  Diagnosis Date  . Asthma   . CHF (congestive heart failure) ([place])   . Chronic kidney disease (CKD), active medical management without dialysis, stage 3 (moderate) ([name])   . COPD (chronic obstructive pulmonary disease) ([name])   . Coronary artery disease   . High cholesterol   . Hypertension   . Myocardial infarction ([name]) 04/2015  . Osteoarthritis    "all over" (09/27/2016)  . Pneumonia 1996   Past Surgical History:  Procedure Laterality Date  . CARDIAC CATHETERIZATION N/A 04/14/2015   Procedure:  Left Heart Cath and Coronary Angiography;  Surgeon: Jolaine Artist, MD;  Location: [facility];  Service: Cardiovascular;  Laterality: N/A;  . CARDIAC CATHETERIZATION N/A 04/14/2015   Procedure: Coronary Stent Intervention;  Surgeon: Lorretta Harp, MD;  Location: [facility];  Service: Cardiovascular;  Laterality: N/A;  . CORONARY ANGIOPLASTY    . JOINT REPLACEMENT    . KNEE ARTHROSCOPY Right 2004  . TOTAL HIP ARTHROPLASTY Right 02/10/2013   Procedure: TOTAL HIP ARTHROPLASTY ANTERIOR APPROACH;  Surgeon: [name], MD;  Location: [facility];  Service: Orthopedics;  Laterality: Right;    Allergies as of 04/24/2018   No Known Allergies     Medication List    STOP taking these medications   aspirin 81 MG chewable tablet   atorvastatin 80 MG tablet Commonly known as:  LIPITOR   doxazosin 2 MG tablet Commonly known as:  CARDURA   lisinopril-hydrochlorothiazide 20-25 MG tablet Commonly known as:  PRINZIDE,ZESTORETIC   nitroGLYCERIN 0.4 MG SL tablet Commonly known as:  NITROSTAT   predniSONE 5 MG tablet Commonly known as:  DELTASONE   SYMBICORT 160-4.5 MCG/ACT inhaler Generic drug:  budesonide-formoterol   ticagrelor 90 MG Tabs tablet Commonly known as:  BRILINTA     TAKE these medications   acetaminophen 160 MG/5ML solution Commonly known as:  TYLENOL Place 20.3 mLs (650 mg total) into feeding tube every 4 (four) hours as needed for mild pain (or temp > 37.5 C (99.5 F)).   acetaminophen 650 MG suppository Commonly known as:  TYLENOL Place 1 suppository (650 mg  total) rectally every 4 (four) hours as needed for mild pain (or temp > 37.5 C (99.5 F)).   amLODipine 10 MG tablet Commonly known as:  NORVASC Place 1 tablet (10 mg total) into feeding tube daily. Start taking on:  04/25/2018 What changed:  how to take this   antiseptic oral rinse Liqd Apply 15 mLs topically as needed for dry mouth.   arformoterol 15 MCG/2ML Nebu Commonly known as:   BROVANA Take 2 mLs (15 mcg total) by nebulization 2 (two) times daily.   chlorhexidine 0.12 % solution Commonly known as:  PERIDEX 15 mLs by Mouth Rinse route 2 (two) times daily.   docusate 50 MG/5ML liquid Commonly known as:  COLACE Place 10 mLs (100 mg total) into feeding tube 2 (two) times daily as needed for mild constipation.   glycopyrrolate 0.2 MG/ML injection Commonly known as:  ROBINUL Inject 1 mL (0.2 mg total) into the vein every 4 (four) hours as needed (increased secreations).   ipratropium-albuterol 0.5-2.5 (3) MG/3ML Soln Commonly known as:  DUONEB Take 3 mLs by nebulization every 6 (six) hours as needed.   ipratropium-albuterol 0.5-2.5 (3) MG/3ML Soln Commonly known as:  DUONEB Take 3 mLs by nebulization 3 (three) times daily.   LORazepam 2 MG/ML injection Commonly known as:  ATIVAN Inject 0.5 mLs (1 mg total) into the vein every 4 (four) hours as needed for anxiety, seizure or sedation (agitation).   morphine 2 MG/ML injection Inject 1 mL (2 mg total) into the vein every 30 (thirty) minutes as needed (Dyspnea, respirations >25, distress, agitation).   ondansetron 4 MG/2ML Soln injection Commonly known as:  ZOFRAN Inject 2 mLs (4 mg total) into the vein every 6 (six) hours as needed for nausea.   polyvinyl alcohol 1.4 % ophthalmic solution Commonly known as:  LIQUIFILM TEARS Place 1 drop into both eyes 4 (four) times daily as needed for dry eyes.       LABORATORY STUDIES CBC    Component Value Date/Time   WBC 16.9 (H) 04/22/2018 0601   RBC 3.34 (L) 04/22/2018 0601   HGB 9.2 (L) 04/22/2018 0601   HCT 31.0 (L) 04/22/2018 0601   PLT 458 (H) 04/22/2018 0601   MCV 92.8 04/22/2018 0601   MCH 27.5 04/22/2018 0601   MCHC 29.7 (L) 04/22/2018 0601   RDW 15.9 (H) 04/22/2018 0601   LYMPHSABS 1.5 03/27/2018 1403   MONOABS 0.9 03/27/2018 1403   EOSABS 0.2 03/27/2018 1403   BASOSABS 0.1 03/27/2018 1403   CMP    Component Value Date/Time   NA 143  04/22/2018 0601   K 3.7 04/22/2018 0601   CL 99 04/22/2018 0601   CO2 34 (H) 04/22/2018 0601   GLUCOSE 172 (H) 04/22/2018 0601   BUN 24 (H) 04/22/2018 0601   CREATININE 1.33 (H) 04/22/2018 0601   CALCIUM 8.1 (L) 04/22/2018 0601   PROT 6.6 03/27/2018 1403   ALBUMIN 3.4 (L) 03/27/2018 1403   AST 25 03/27/2018 1403   ALT 16 03/27/2018 1403   ALKPHOS 82 03/27/2018 1403   BILITOT 1.2 03/27/2018 1403   GFRNONAA 38 (L) 04/22/2018 0601   GFRAA 44 (L) 04/22/2018 0601   COAGS Lab Results  Component Value Date   INR 1.06 04/11/2018   INR 1.06 03/27/2018   INR 1.06 04/14/2015   Lipid Panel    Component Value Date/Time   CHOL 177 03/30/2018 0235   TRIG 67 03/30/2018 2248   HDL 101 03/30/2018 0235   CHOLHDL 1.8 03/30/2018 0235  VLDL 10 03/30/2018 0235   LDLCALC 66 03/30/2018 0235   HgbA1C  Lab Results  Component Value Date   HGBA1C 5.5 03/30/2018   Urinalysis    Component Value Date/Time   COLORURINE YELLOW 04/13/2018 1840   APPEARANCEUR HAZY (A) 04/13/2018 1840   LABSPEC 1.018 04/13/2018 1840   PHURINE 5.0 04/13/2018 1840   GLUCOSEU NEGATIVE 04/13/2018 1840   HGBUR NEGATIVE 04/13/2018 1840   BILIRUBINUR NEGATIVE 04/13/2018 1840   KETONESUR NEGATIVE 04/13/2018 1840   PROTEINUR NEGATIVE 04/13/2018 1840   UROBILINOGEN 0.2 02/02/2013 0909   NITRITE NEGATIVE 04/13/2018 1840   LEUKOCYTESUR NEGATIVE 04/13/2018 1840   Urine Drug Screen     Component Value Date/Time   LABOPIA NONE DETECTED 03/29/2018 1110   COCAINSCRNUR NONE DETECTED 03/29/2018 1110   LABBENZ NONE DETECTED 03/29/2018 1110   AMPHETMU NONE DETECTED 03/29/2018 1110   THCU NONE DETECTED 03/29/2018 1110   LABBARB NONE DETECTED 03/29/2018 1110    Alcohol Level    Component Value Date/Time   ETH <10 03/27/2018 1354     SIGNIFICANT DIAGNOSTIC STUDIES Ct Angio Head W Or Wo Contrast 03/27/2018 IMPRESSION:  1. No vascular malformation or abnormal enhancement of the brain identified.  2. Stable cerebellar  and intraventricular hemorrhage.  3. Patent anterior and posterior intracranial circulation. No large vessel occlusion, aneurysm, or significant stenosis.  4. Calcific atherosclerosis of the carotid and vertebral arteries with mild stenosis.    Ct Head Wo Contrast 03/28/2018 IMPRESSION:  1. Stable volume of intracranial hemorrhage within the left cerebellar hemisphere and the ventricular system. Some redistribution of blood products to the lateral ventricle atria and sylvian fissures.  2. No new acute intracranial abnormality.  3. Interval right frontal approach ventriculostomy catheter placement. Mild decrease in hydrocephalus.    Ct Head Wo Contrast 03/27/2018 IMPRESSION:  2 cm hematoma in the deep left cerebellum with fourth ventricular extension extending into the third/lateral ventricles and into the dorsal cervical canal. There is mild hydrocephalus at the lateral ventricles when compared to 2016. A deep cerebellar origin favors hypertensive etiology.   CUS 1-39% ICA stenosis. Vertebral artery flow is antegrade. Significant calcific plaque bilateral bifurcations, however, velocities are not significantly increased.   TTE - Left ventricle: The cavity size was normal. Wall thickness was increased in a pattern of mild LVH. Systolic function was normal. The estimated ejection fraction was in the range of 60% to 65%. Doppler parameters are consistent with both elevated ventricular end-diastolic filling pressure and elevated left atrial filling pressure. - Aortic valve: There was mild stenosis. Valve area (VTI): 1.88 cm^2. Valve area (Vmax): 1.82 cm^2. Valve area (Vmean): 1.59 cm^2. - Mitral valve: Calcified annulus. - Atrial septum: No defect or patent foramen ovale was identified.  Ct Head Wo Contrast 03/30/2018 IMPRESSION: Stable volume of intraventricular, subarachnoid, and left cerebellar hemorrhage. Stable ventricle size. No new acute intracranial  abnormality. Electronically Signed   By: Kristine Garbe M.D.   On: 03/30/2018 05:12    Ct Head Wo Contrast 04/01/2018  IMPRESSION: 1. Partial interval dispersion of intraventricular, subarachnoid, and left cerebellar brain parenchymal hemorrhage. 2. Resolution of hydrocephalus. Stable position of right frontal approach ventriculostomy catheter. 3. No new acute intracranial process. Electronically Signed   By: Kristine Garbe M.D.   On: 04/01/2018 05:26   Dg Chest Port 1 View 04/01/2018 IMPRESSION: Endotracheal tube and nasogastric tube unchanged in well-positioned. Persistent atelectasis in both lower lobes. Electronically Signed   By: Nelson Chimes M.D.   On: 04/01/2018 09:08  Ct Head Wo Contrast 04/02/2018 IMPRESSION: 1. Recurrent lateral and third ventricular hydrocephalus. Newly seen clot along the EVD, presumed cause of catheter dysfunction. 2. No interval bleeding. 3. Increased low-density along the EVD, likely tracking CSF. 4. No herniation or infarct. Electronically Signed   By: Monte Fantasia M.D.   On: 04/02/2018 13:14   Dg Chest Port 1 View9/01/2018 IMPRESSION: 1. Tracheostomy tube in od anatomic position. Right PICC line noted with tip in stable position. 2.  Stable cardiomegaly. 3. Bibasilar atelectasis. Bibasilar infiltrates/edema. Small left pleural effusion. No pneumothorax. Electronically Signed   By: Marcello Moores  Register   On: 04/11/2018 10:38   Ct Head Wo Contrast Result Date: 04/12/2018  IMPRESSION: 1. Interval EVD removal with unchanged mild ventriculomegaly. 2. Unchanged left cerebellar hematoma and surrounding vasogenic edema. 3. Small volume intraventricular hemorrhage without significant change. 4. No evidence of new intracranial abnormality. Electronically Signed   By: Logan Bores M.D.   On: 04/12/2018 19:22      HISTORY OF PRESENT ILLNESS Amber Rogers is a 77 y.o. female with a history of CHF, HTN, hyperlipidemia who presents with cerebellar  hemorrhage with intraventricular extension.   She was in her normal state of health until around noon when she had acute onset of nausea vomiting, dizziness.  She also endorsed headache.  She presented to the emergency department where CT scan was done which showed a hematoma in the left cerebellum with what appeared to be early hydrocephalus.  Neurosurgery was consulted, favor holding off on emergent ventriculostomy given the fact that she is on dual therapy.  However, per her husband she has continued to decline over the past few hours and is worse now than she was even an hour ago with Dr. Kathyrn Sheriff evaluated her.  HOSPITAL COURSE Ms. SHAVONN CONVEY is a 77 y.o. female with history of COPD, CHF, HTN, HLD, CKD III, CAD s/p stent presenting with sudden onset nausea, vomiting and dizziness.  Overall, poor neuro progression with ongoing medical complications. Palliative care involved. Dtrs decided for comfort care 9/17.    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 Hospital day #27.  Family frustrated with lack of improvement and concern she would not want long-term feeding tube and SNF care  DNR after meeting with palliative care 9/13.   Family decided on full comfort care 9/17  Disposition:  [facility]   Lethargy  Obstructve hydrocephalus, stable  MSSA Pneumonia Possible intermittent aspiration Tracheobronchitis Leukocytosis Respiratory distress   Carotid stenosis   CUS 08/2017 R ICA stenosis 50-69%, L ICA < 50%  CUS repeat showed b/l ICA 1-39% stenosis but significant calcified plaque at carotid bifurcation bilaterally.   Acute Hypoxic Respiratory failure - extubation and re-intubation S/p Trach placement  Extubated and re-intubated 04/03/18  Trach done 04/11/18  Currently on trach collar- currently on 10L O2  Dysphagia secondary to  stroke  Hypertensive Emergency, resolved  AKI  Hyperlipidemia  Other Stroke Risk Factors  Advanced age  Former Cigarette smoker, quit 2 years ago  Coronary artery disease s/p stent - on ASA and brilinta PTA  Chronic Congestive heart failure  Anemia, microcytic   Other Active Problems  Hx vertigo   COPD  Hypokalemia  DISCHARGE EXAM Blood pressure (!) 185/102, pulse 88, temperature 99.9 F (37.7 C), temperature source Oral, resp. rate 18, height 5\' 6"  (1.676 m), weight 70.8 kg, SpO2 90 %. General - Well nourished,  well developed, on trach collar. Cardiovascular - Regular rate and rhythm. Skin - Skin above trach reddened and sore to touch - still with redness and edema.  Trach collar not tight. Dressing CD&I. Abd Soft and nondistended.   Neuro - on trach collar, eyes open  to voice, lethargic. Pupils are equal and reactive to light. Blinking to visual threat on the left but not on the right. Slight Right facial droop. MAE x 4 against gravity BUE 3+/5 and BLE 3/5. babinski negative bilaterally.  Coordination not cooperative and gait not tested  Discharge Diet    Diet Order    None      DISCHARGE PLAN  Disposition: [facility]  No Anticoagulation - pt is s/p Hemorrhagic stroke   Ongoing management by Dr. Orpah Melter Physician at time of discharge   45 minutes were spent preparing discharge.   Carney Bern 04/24/18 12:25 PM  I have personally examined this patient, reviewed notes, independently viewed imaging studies, participated in medical decision making and plan of care.ROS completed by me personally and pertinent positives fully documented  I have made any additions or clarifications directly to the above note. Agree with note above.    Antony Contras, MD Medical Director [facility] Pager: [phone] 04/24/2018 1:16 PM

## 2018-05-06 DEATH — deceased

## 2018-06-06 DEATH — deceased

## 2019-04-20 IMAGING — DX DG CHEST 1V PORT
1 series · 1 of 1 positions shown · non-contrast
Comparison: [DATE] [DATE], [DATE] [DATE] a.m.

CLINICAL DATA: Aspiration pneumonia and vomiting.

EXAM:
PORTABLE CHEST 1 VIEW

[chest ap]
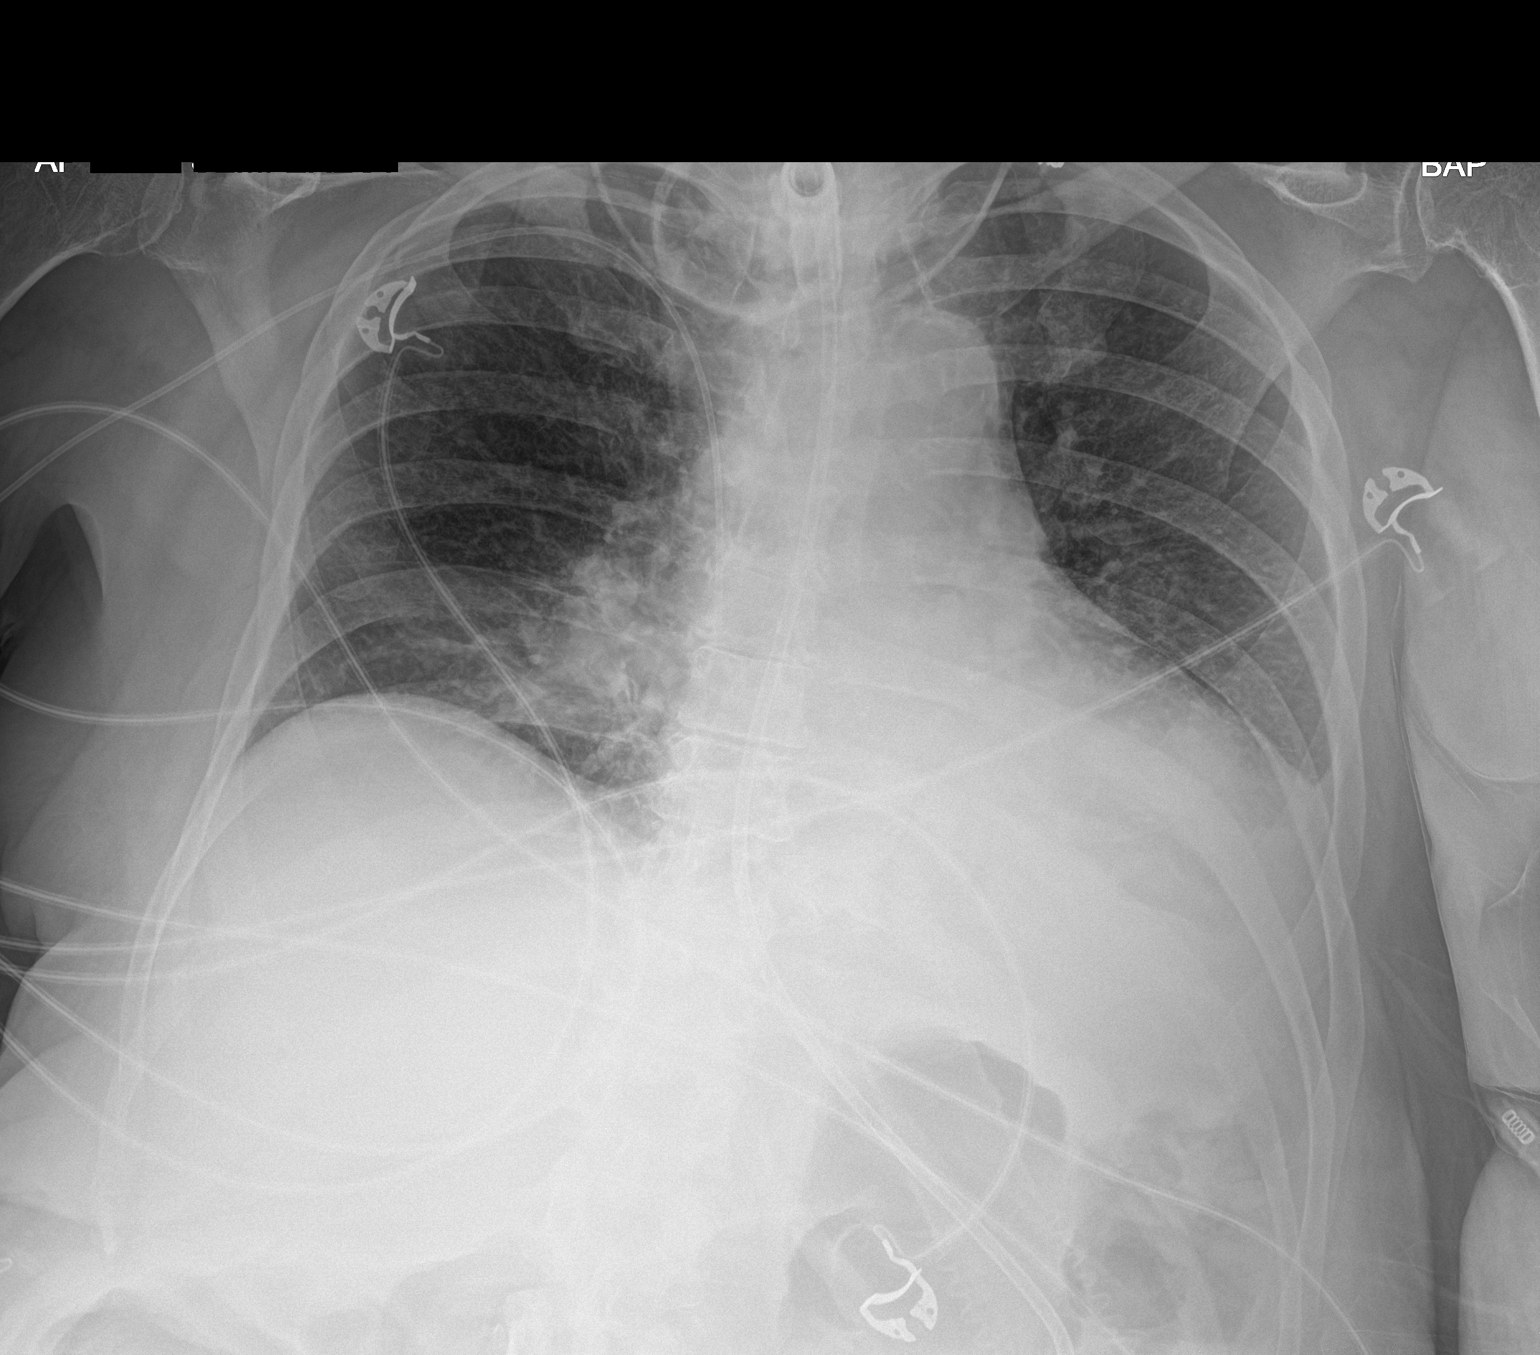

[1 of 1 positions shown; findings below may reference images not displayed]

FINDINGS: The heart size and mediastinal contours are stable. The heart size
is enlarged. Tracheostomy tube is unchanged. Right central venous
line is unchanged. At the upper of tube is identified distal tip not
included on film. Patchy consolidation of the left lung base and
right infrahilar region are noted. There is mild interstitial edema.
The visualized skeletal structures are stable.
IMPRESSION: Patchy opacity of the left lung base and right infrahilar region
suspicious for pneumonia. Mild interstitial edema.

## 2019-04-20 IMAGING — CR DG CHEST 1V PORT
1 series · 1 of 1 positions shown · non-contrast
Comparison: April 13, 2018

CLINICAL DATA: Shortness of breath

EXAM:
PORTABLE CHEST 1 VIEW

[AP]
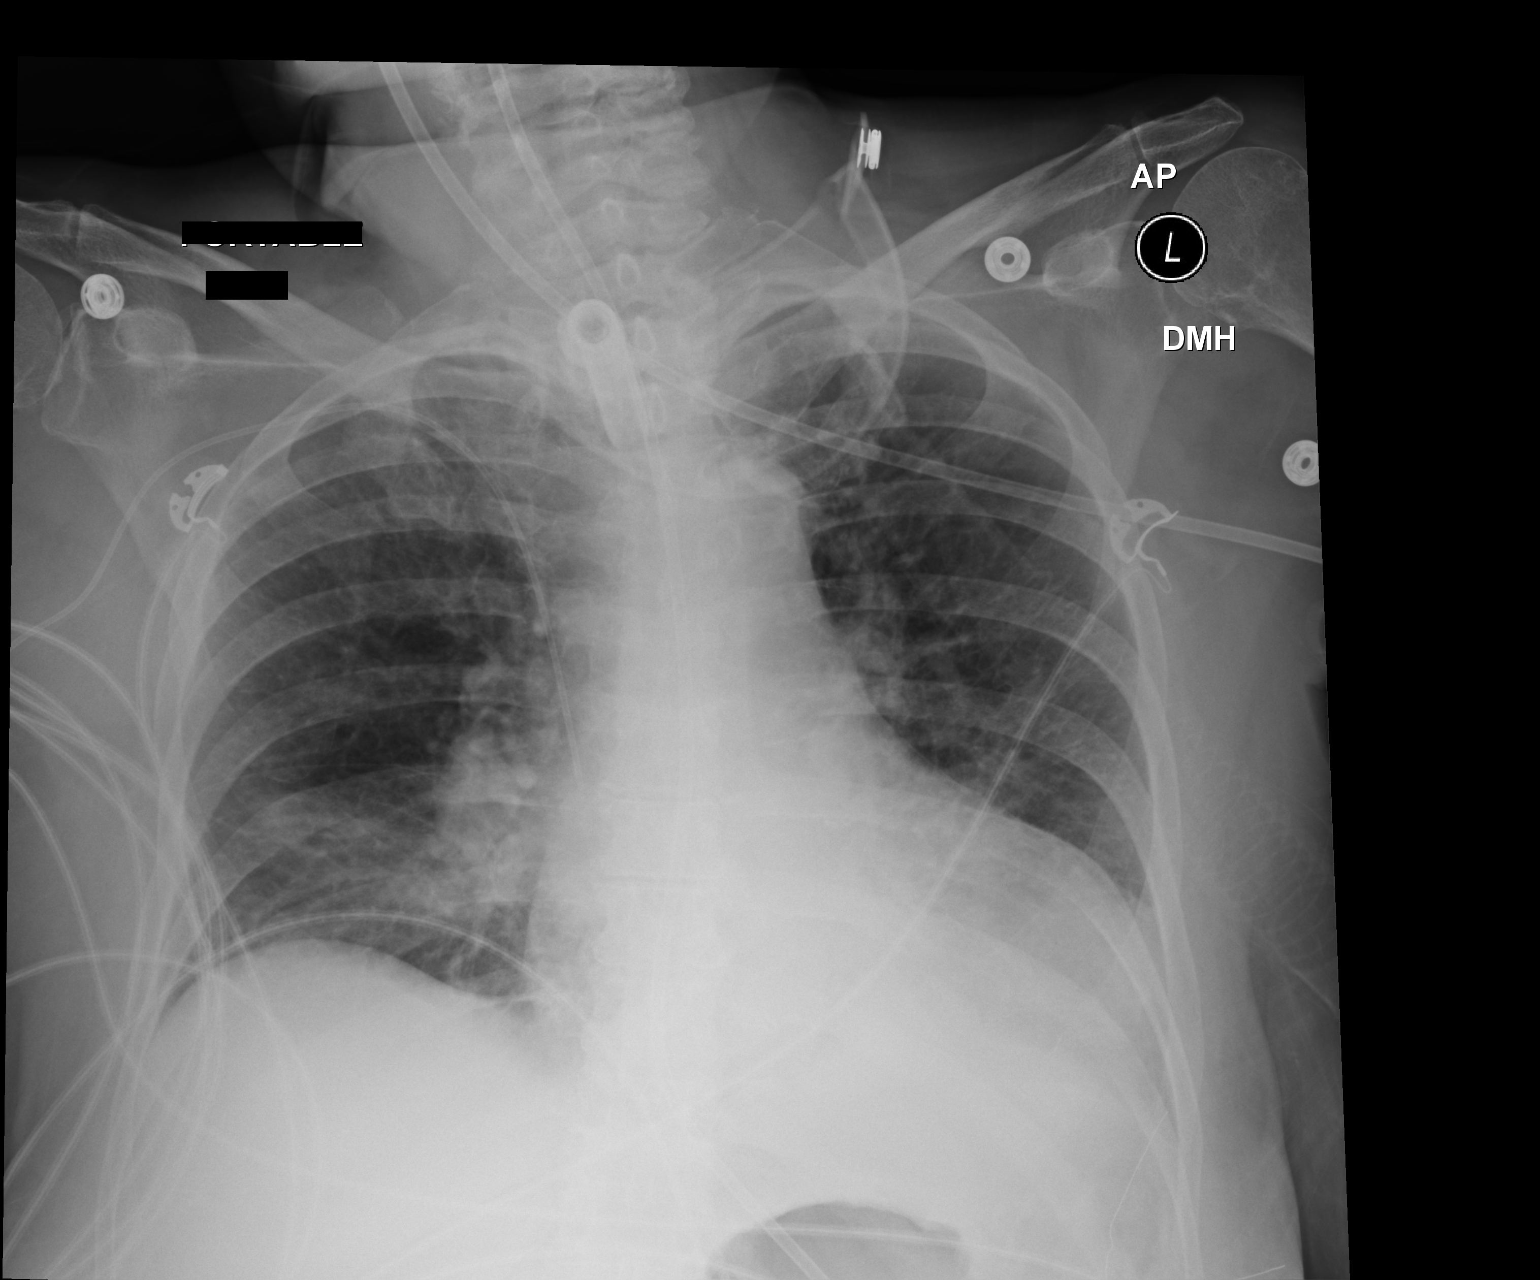

[1 of 1 positions shown; findings below may reference images not displayed]

FINDINGS: Tracheostomy catheter tip is 4.6 cm above the carina. Feeding tube
tip is below the diaphragm. Central catheter tip is in the superior
vena cava. No pneumothorax.

There is patchy airspace opacity in the right lower lobe. There is a
small left pleural effusion with medial left base atelectasis. Heart
is upper normal in size with pulmonary vascularity normal. No
adenopathy. No bone lesions.
IMPRESSION: Tube and catheter positions as described without pneumothorax.
Patchy airspace opacity, likely pneumonia, right base. Atelectasis
medial left base with small left pleural effusion. Stable cardiac
prominence.

## 2019-04-24 IMAGING — CT CT HEAD W/O CM
4 of 5 series · 15 of 47 positions shown, 17 images · non-contrast
Comparison: CT HEAD April 12, 2017

CLINICAL DATA: Altered mental status, assess for hemorrhage.

EXAM:
CT HEAD WITHOUT CONTRAST
TECHNIQUE: Contiguous axial images were obtained from the base of the skull
through the vertex without intravenous contrast.

[Series 2: head wo · axial · 0.41mm/px · z∈[-174,-64]mm · 4 of 38 slices shown (1 of 2)]
[im 8/38  brain]
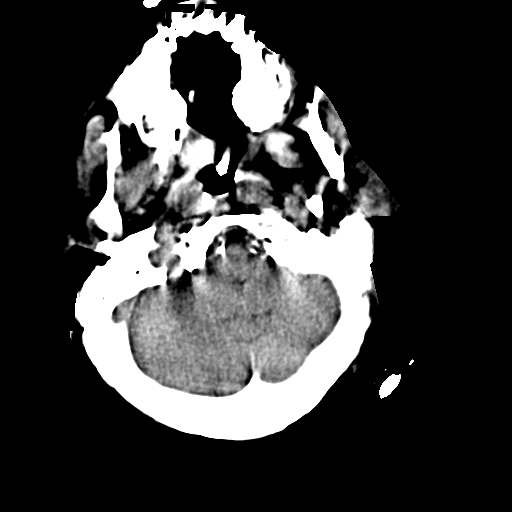
[im 15/38  brain]
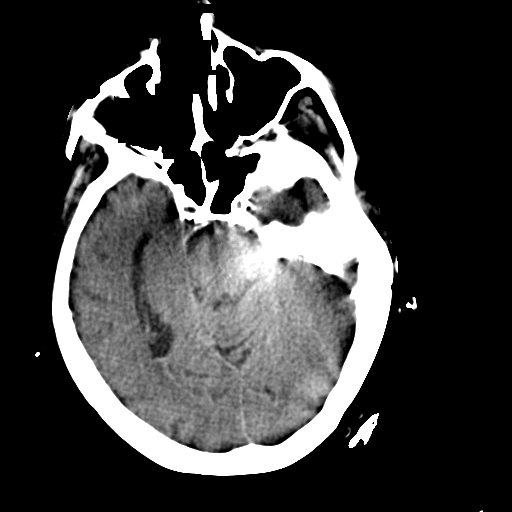
[im 23/38  brain]
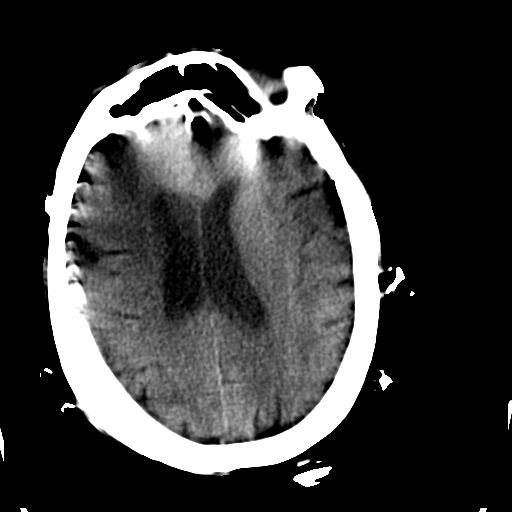
[im 30/38  brain]
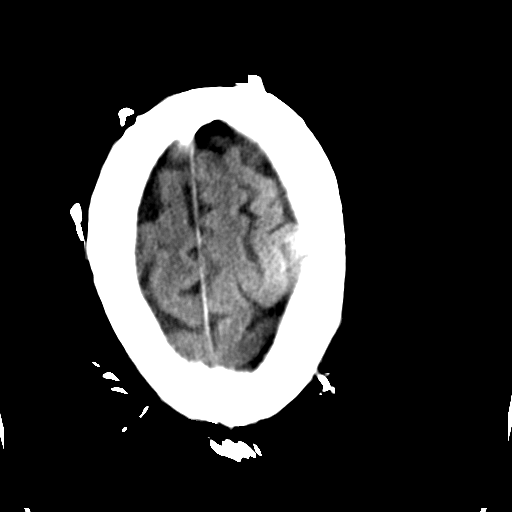

[Series 7: head wo · axial · 0.47mm/px · z∈[-178,-58]mm · 5 of 38 slices shown, 7 images (2 of 2)]
[im 7/38  brain]
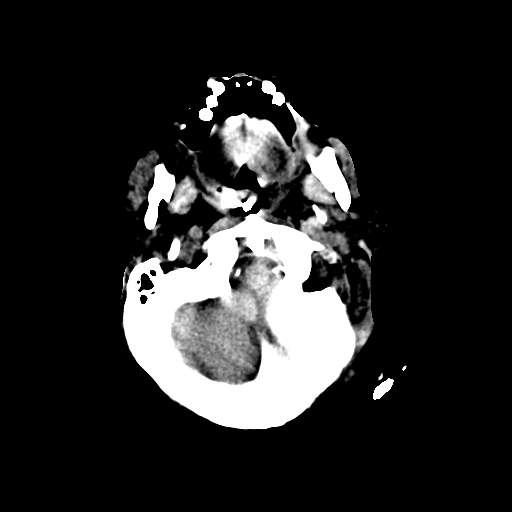
[im 7/38  bone]
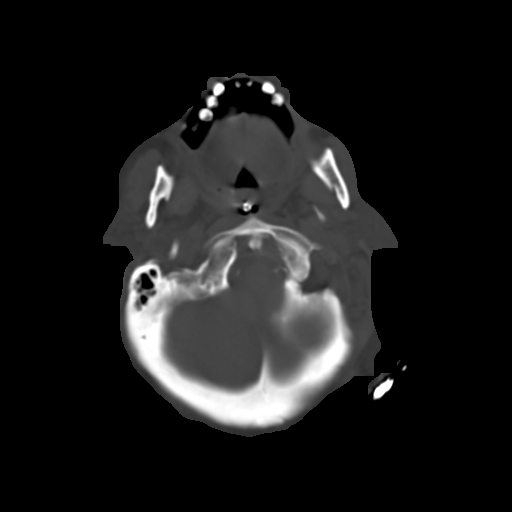
[im 13/38  brain]
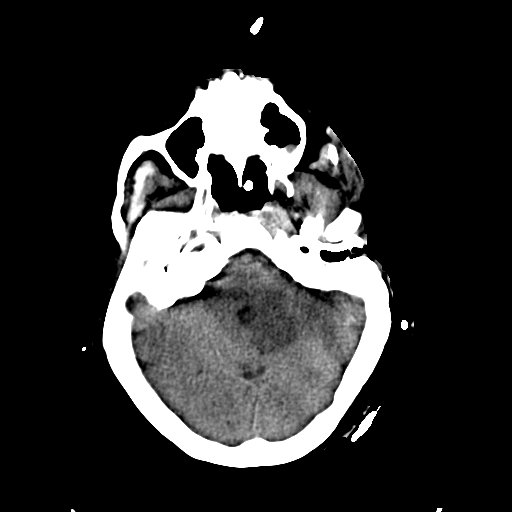
[im 19/38  brain]
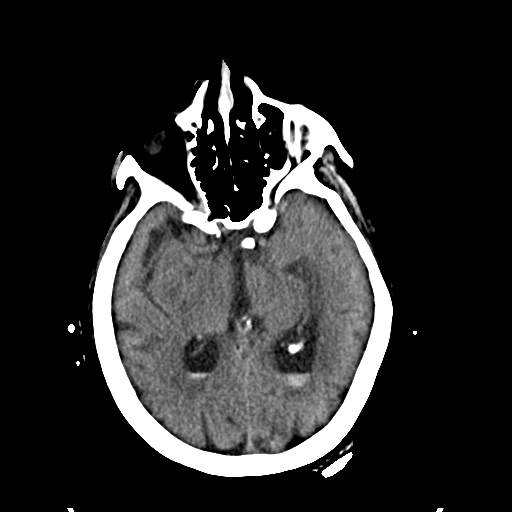
[im 25/38  brain]
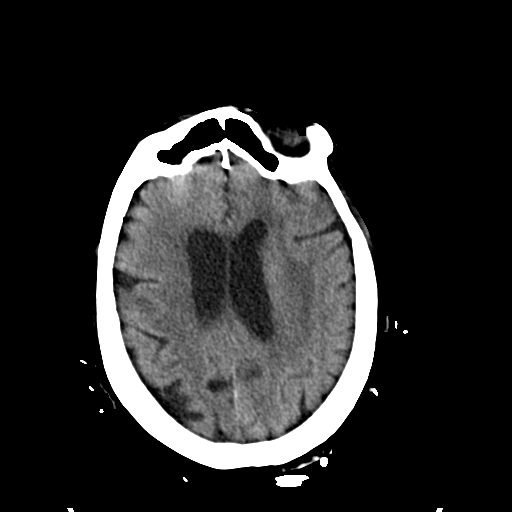
[im 31/38  brain]
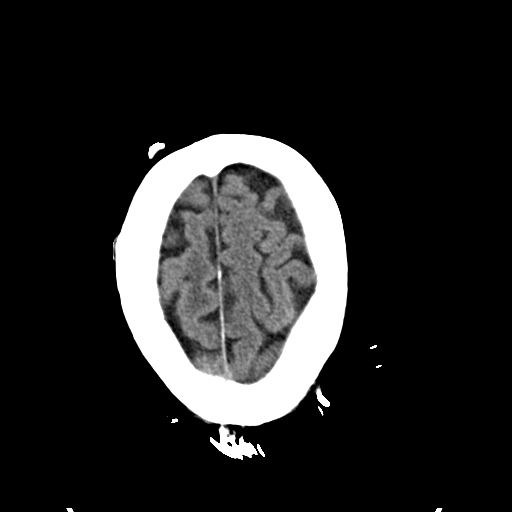
[im 31/38  bone]
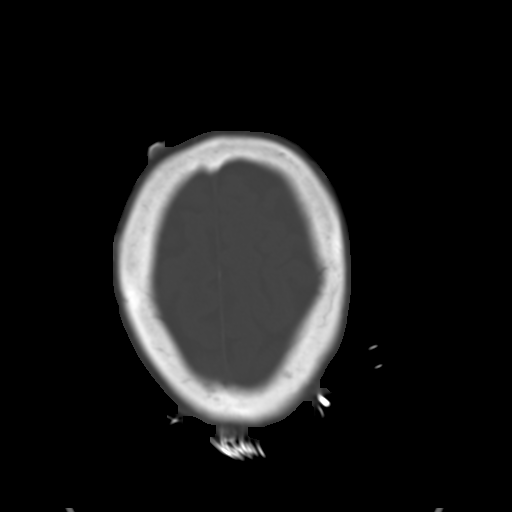

[Series 8: cor soft · coronal · 0.39mm/px · 3 of 65 slices shown]
[im 22/65  brain]
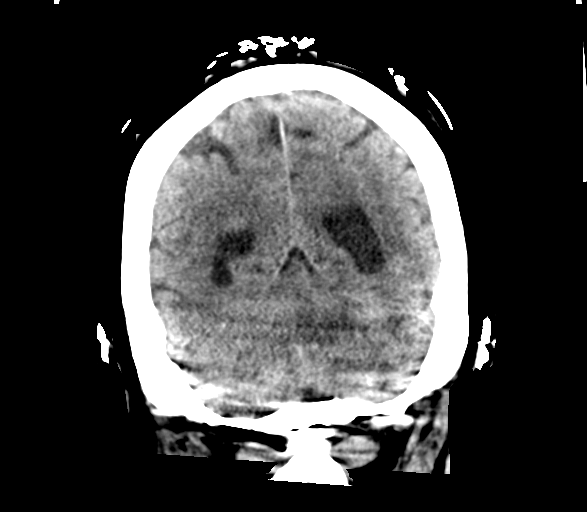
[im 29/65  brain]
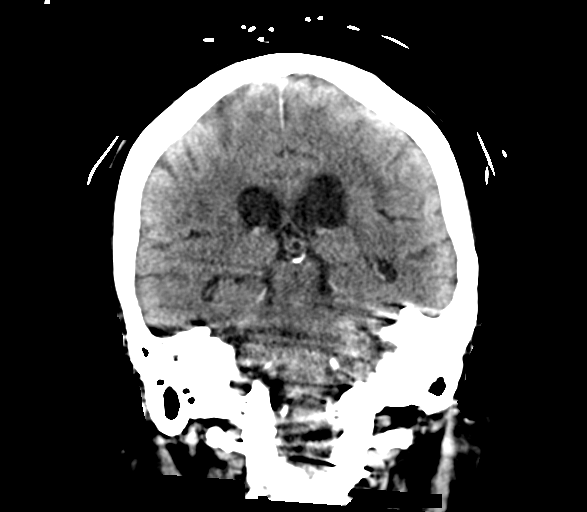
[im 36/65  brain]
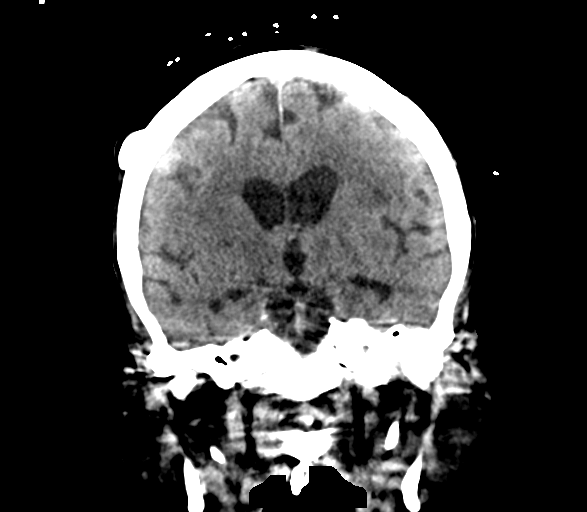

[Series 9: sag soft · sagittal · 0.39mm/px · 3 of 68 slices shown]
[im 23/68  brain]
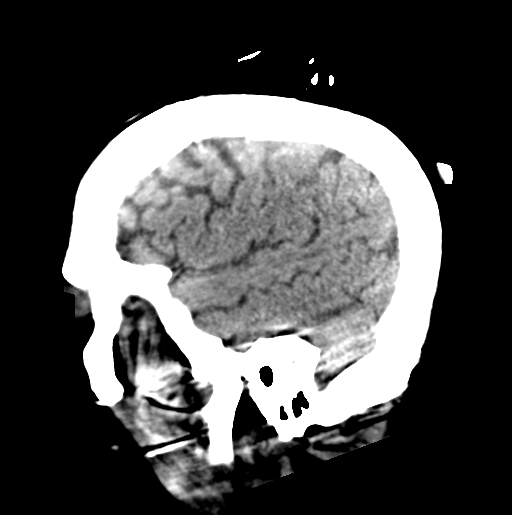
[im 34/68  brain]
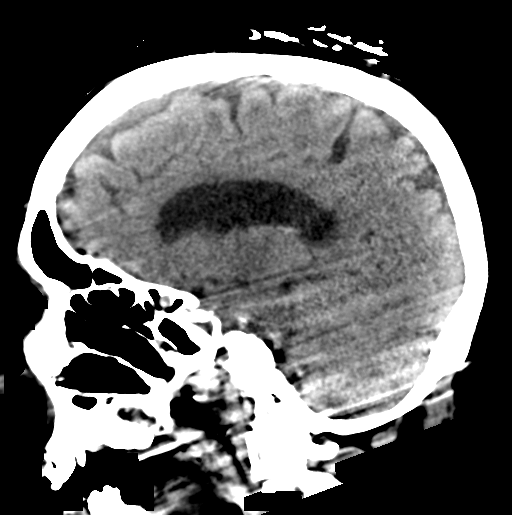
[im 45/68  brain]
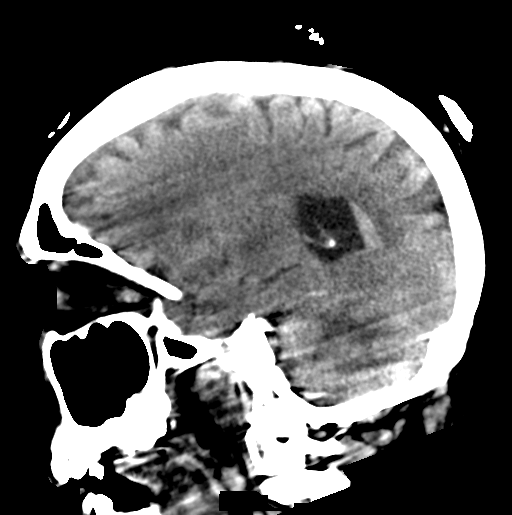

[15 of 47 positions shown; findings below may reference images not displayed]

FINDINGS: Moderately motion degraded examination further limited by streak
artifact from EEG leads, image quality somewhat improved on repeat
examination..

BRAIN: Evolving LEFT cerebellar infarct, less conspicuous blood
products, potentially obscured by motion. Mild regional mass effect
without midline shift. Old small RIGHT cerebellar infarct. Probable
basal ganglia lacunar infarcts. Similar dependent blood products
bilateral occipital horns. Mild ventriculomegaly in the base of
global parenchymal brain volume loss, improved from prior
examination. No hydrocephalus. Patchy to confluent supratentorial
white matter hypodensities. No acute large vascular territory
infarcts. No abnormal extra-axial fluid collections.

VASCULAR: Moderate to severe calcific atherosclerosis of the carotid
siphons.

SKULL: No skull fracture. RIGHT frontal burr hole. Small RIGHT
frontal exostosis. No significant scalp soft tissue swelling.

SINUSES/ORBITS: Trace paranasal sinus mucosal thickening.
Nasogastric tube via LEFT nares. RIGHT mastoid effusion. LEFT middle
ear and mastoid effusion. Included ocular globes and orbital
contents are non-suspicious.

OTHER: Poor dentition with multiple dental caries and periapical
abscess.
IMPRESSION: 1. No acute intracranial process on this motion degraded
examination.
2. LEFT cerebellar edema at site of prior hemorrhage without
demonstrable blood products.
3. Similar intraventricular hemorrhage without hydrocephalus.
